# Patient Record
Sex: Female | Born: 1970
Health system: Southern US, Community
[De-identification: ages and names within clinical notes are randomized; demographics above are authoritative.]

## PROBLEM LIST (undated history)

## (undated) DIAGNOSIS — Z464 Encounter for fitting and adjustment of orthodontic device: Secondary | ICD-10-CM

## (undated) DIAGNOSIS — E785 Hyperlipidemia, unspecified: Secondary | ICD-10-CM

## (undated) DIAGNOSIS — IMO0001 Reserved for inherently not codable concepts without codable children: Secondary | ICD-10-CM

## (undated) DIAGNOSIS — E119 Type 2 diabetes mellitus without complications: Secondary | ICD-10-CM

## (undated) DIAGNOSIS — I7300 Raynaud's syndrome without gangrene: Secondary | ICD-10-CM

## (undated) DIAGNOSIS — G43909 Migraine, unspecified, not intractable, without status migrainosus: Secondary | ICD-10-CM

## (undated) DIAGNOSIS — I10 Essential (primary) hypertension: Secondary | ICD-10-CM

## (undated) DIAGNOSIS — K219 Gastro-esophageal reflux disease without esophagitis: Secondary | ICD-10-CM

## (undated) HISTORY — DX: Type 2 diabetes mellitus without complications: E11.9

## (undated) HISTORY — DX: Essential (primary) hypertension: I10

## (undated) HISTORY — PX: BREAST BIOPSY: SHX20

## (undated) HISTORY — PX: BREAST SURGERY: SHX581

## (undated) HISTORY — DX: Hyperlipidemia, unspecified: E78.5

## (undated) HISTORY — PX: ABDOMINAL HYSTERECTOMY: SHX81

## (undated) HISTORY — PX: APPENDECTOMY: SHX54

---

## 1995-08-25 HISTORY — PX: REDUCTION MAMMAPLASTY: SUR839

## 2000-10-04 ENCOUNTER — Other Ambulatory Visit: Admission: RE | Admit: 2000-10-04 | Discharge: 2000-10-04 | Payer: Self-pay | Admitting: Obstetrics and Gynecology

## 2001-02-25 ENCOUNTER — Ambulatory Visit (HOSPITAL_COMMUNITY): Admission: RE | Admit: 2001-02-25 | Discharge: 2001-02-25 | Payer: Self-pay | Admitting: Obstetrics and Gynecology

## 2001-02-25 ENCOUNTER — Encounter: Payer: Self-pay | Admitting: Obstetrics and Gynecology

## 2001-03-07 ENCOUNTER — Inpatient Hospital Stay (HOSPITAL_COMMUNITY): Admission: AD | Admit: 2001-03-07 | Discharge: 2001-03-07 | Payer: Self-pay | Admitting: Obstetrics and Gynecology

## 2001-03-07 ENCOUNTER — Encounter: Payer: Self-pay | Admitting: Obstetrics and Gynecology

## 2001-04-26 ENCOUNTER — Encounter: Admission: RE | Admit: 2001-04-26 | Discharge: 2001-07-25 | Payer: Self-pay | Admitting: Obstetrics and Gynecology

## 2001-06-18 ENCOUNTER — Inpatient Hospital Stay (HOSPITAL_COMMUNITY): Admission: AD | Admit: 2001-06-18 | Discharge: 2001-06-18 | Payer: Self-pay | Admitting: Obstetrics and Gynecology

## 2001-06-19 ENCOUNTER — Inpatient Hospital Stay (HOSPITAL_COMMUNITY): Admission: AD | Admit: 2001-06-19 | Discharge: 2001-06-21 | Payer: Self-pay | Admitting: Obstetrics and Gynecology

## 2001-07-20 ENCOUNTER — Other Ambulatory Visit: Admission: RE | Admit: 2001-07-20 | Discharge: 2001-07-20 | Payer: Self-pay | Admitting: Obstetrics and Gynecology

## 2004-10-03 ENCOUNTER — Other Ambulatory Visit: Admission: RE | Admit: 2004-10-03 | Discharge: 2004-10-03 | Payer: Self-pay | Admitting: Obstetrics and Gynecology

## 2005-08-28 ENCOUNTER — Inpatient Hospital Stay (HOSPITAL_COMMUNITY): Admission: AD | Admit: 2005-08-28 | Discharge: 2005-08-28 | Payer: Self-pay | Admitting: Obstetrics and Gynecology

## 2005-09-09 ENCOUNTER — Inpatient Hospital Stay (HOSPITAL_COMMUNITY): Admission: AD | Admit: 2005-09-09 | Discharge: 2005-09-12 | Payer: Self-pay | Admitting: Obstetrics and Gynecology

## 2005-10-27 ENCOUNTER — Other Ambulatory Visit: Admission: RE | Admit: 2005-10-27 | Discharge: 2005-10-27 | Payer: Self-pay | Admitting: Obstetrics and Gynecology

## 2007-04-09 ENCOUNTER — Observation Stay: Payer: Self-pay | Admitting: Surgery

## 2009-05-16 ENCOUNTER — Other Ambulatory Visit: Payer: Self-pay | Admitting: Family Medicine

## 2010-03-18 ENCOUNTER — Other Ambulatory Visit: Payer: Self-pay | Admitting: Family Medicine

## 2010-08-01 ENCOUNTER — Ambulatory Visit: Payer: Self-pay | Admitting: General Surgery

## 2011-02-03 ENCOUNTER — Other Ambulatory Visit: Payer: Self-pay | Admitting: Family Medicine

## 2011-08-16 ENCOUNTER — Emergency Department: Payer: Self-pay | Admitting: Emergency Medicine

## 2012-05-27 ENCOUNTER — Other Ambulatory Visit: Payer: Self-pay | Admitting: Family Medicine

## 2012-05-27 ENCOUNTER — Ambulatory Visit: Payer: Self-pay | Admitting: Family Medicine

## 2012-05-27 LAB — CBC WITH DIFFERENTIAL/PLATELET
Basophil #: 0.1 10*3/uL (ref 0.0–0.1)
Basophil %: 0.8 %
Eosinophil #: 0.1 10*3/uL (ref 0.0–0.7)
HCT: 38.6 % (ref 35.0–47.0)
HGB: 12.3 g/dL (ref 12.0–16.0)
Lymphocyte #: 2.5 10*3/uL (ref 1.0–3.6)
MCH: 24.7 pg — ABNORMAL LOW (ref 26.0–34.0)
MCHC: 32 g/dL (ref 32.0–36.0)
MCV: 77 fL — ABNORMAL LOW (ref 80–100)
Monocyte #: 0.4 x10 3/mm (ref 0.2–0.9)
Neutrophil #: 6.2 10*3/uL (ref 1.4–6.5)
Neutrophil %: 67 %
RDW: 16 % — ABNORMAL HIGH (ref 11.5–14.5)

## 2012-05-27 LAB — COMPREHENSIVE METABOLIC PANEL
Albumin: 4.1 g/dL (ref 3.4–5.0)
Alkaline Phosphatase: 133 U/L (ref 50–136)
Bilirubin,Total: 0.3 mg/dL (ref 0.2–1.0)
Calcium, Total: 8.9 mg/dL (ref 8.5–10.1)
Chloride: 92 mmol/L — ABNORMAL LOW (ref 98–107)
Co2: 25 mmol/L (ref 21–32)
EGFR (Non-African Amer.): >60
Glucose: 81 mg/dL (ref 65–99)
SGOT(AST): 16 U/L (ref 15–37)
SGPT (ALT): 20 U/L (ref 12–78)

## 2012-08-05 ENCOUNTER — Ambulatory Visit: Payer: Self-pay | Admitting: Gastroenterology

## 2013-06-14 ENCOUNTER — Other Ambulatory Visit: Payer: Self-pay | Admitting: Family Medicine

## 2013-06-14 LAB — COMPREHENSIVE METABOLIC PANEL
Albumin: 4.4 g/dL (ref 3.4–5.0)
Bilirubin,Total: 0.3 mg/dL (ref 0.2–1.0)
Calcium, Total: 9.4 mg/dL (ref 8.5–10.1)
Chloride: 105 mmol/L (ref 98–107)
Co2: 28 mmol/L (ref 21–32)
Creatinine: 0.83 mg/dL (ref 0.60–1.30)
EGFR (African American): >60
Osmolality: 277 (ref 275–301)
SGOT(AST): 17 U/L (ref 15–37)
SGPT (ALT): 19 U/L (ref 12–78)

## 2013-06-14 LAB — LIPID PANEL
Cholesterol: 148 mg/dL (ref 0–200)
Triglycerides: 75 mg/dL (ref 0–200)

## 2013-06-23 ENCOUNTER — Ambulatory Visit: Payer: Self-pay | Admitting: Family Medicine

## 2013-11-22 ENCOUNTER — Encounter: Payer: Self-pay | Admitting: *Deleted

## 2013-12-07 ENCOUNTER — Ambulatory Visit: Payer: Self-pay | Admitting: Nurse Practitioner

## 2014-05-22 ENCOUNTER — Other Ambulatory Visit: Payer: Self-pay | Admitting: Family Medicine

## 2014-05-22 LAB — COMPREHENSIVE METABOLIC PANEL
ALT: 25 U/L
Albumin: 3.8 g/dL (ref 3.4–5.0)
Alkaline Phosphatase: 119 U/L — ABNORMAL HIGH
Anion Gap: 6 — ABNORMAL LOW (ref 7–16)
BUN: 21 mg/dL — AB (ref 7–18)
Bilirubin,Total: 0.2 mg/dL (ref 0.2–1.0)
CALCIUM: 8.9 mg/dL (ref 8.5–10.1)
CO2: 27 mmol/L (ref 21–32)
Chloride: 107 mmol/L (ref 98–107)
Creatinine: 0.78 mg/dL (ref 0.60–1.30)
EGFR (African American): >60
GLUCOSE: 114 mg/dL — AB (ref 65–99)
OSMOLALITY: 283 (ref 275–301)
Potassium: 4 mmol/L (ref 3.5–5.1)
SGOT(AST): 12 U/L — ABNORMAL LOW (ref 15–37)
SODIUM: 140 mmol/L (ref 136–145)
TOTAL PROTEIN: 7.4 g/dL (ref 6.4–8.2)

## 2014-05-22 LAB — LIPID PANEL
CHOLESTEROL: 160 mg/dL (ref 0–200)
HDL Cholesterol: 34 mg/dL — ABNORMAL LOW (ref 40–60)
Ldl Cholesterol, Calc: 104 mg/dL — ABNORMAL HIGH (ref 0–100)
TRIGLYCERIDES: 111 mg/dL (ref 0–200)
VLDL Cholesterol, Calc: 22 mg/dL (ref 5–40)

## 2014-07-05 ENCOUNTER — Other Ambulatory Visit (INDEPENDENT_AMBULATORY_CARE_PROVIDER_SITE_OTHER): Payer: Self-pay

## 2014-07-05 DIAGNOSIS — E785 Hyperlipidemia, unspecified: Secondary | ICD-10-CM

## 2014-07-05 DIAGNOSIS — E0821 Diabetes mellitus due to underlying condition with diabetic nephropathy: Secondary | ICD-10-CM

## 2014-07-28 ENCOUNTER — Ambulatory Visit: Payer: Self-pay | Admitting: Dietician

## 2014-08-27 ENCOUNTER — Encounter (HOSPITAL_COMMUNITY)
Admission: RE | Disposition: A | Discharge status: Home / Self Care | Payer: Self-pay | Source: Ambulatory Visit | Attending: Surgery

## 2014-08-27 ENCOUNTER — Ambulatory Visit (HOSPITAL_COMMUNITY)
Admission: RE | Admit: 2014-08-27 | Discharge: 2014-08-27 | Disposition: A | Discharge status: Home / Self Care | Payer: 59 | Source: Ambulatory Visit | Attending: Surgery | Admitting: Surgery

## 2014-08-27 DIAGNOSIS — Z01818 Encounter for other preprocedural examination: Secondary | ICD-10-CM | POA: Insufficient documentation

## 2014-08-27 DIAGNOSIS — E669 Obesity, unspecified: Secondary | ICD-10-CM | POA: Diagnosis not present

## 2014-08-27 HISTORY — PX: BREATH TEK H PYLORI: SHX5422

## 2014-08-27 LAB — PROTIME-INR
INR: 0.9 (ref <1.50)
Prothrombin Time: 12.2 seconds (ref 11.6–15.2)

## 2014-08-27 SURGERY — BREATH TEST, FOR HELICOBACTER PYLORI

## 2014-08-28 ENCOUNTER — Encounter (HOSPITAL_COMMUNITY): Payer: Self-pay | Admitting: Surgery

## 2014-08-28 LAB — LIPID PANEL
Cholesterol: 143 mg/dL (ref 0–200)
HDL: 39 mg/dL — AB (ref >39)
LDL Cholesterol: 92 mg/dL (ref 0–99)
TRIGLYCERIDES: 61 mg/dL (ref <150)
Total CHOL/HDL Ratio: 3.7 Ratio
VLDL: 12 mg/dL (ref 0–40)

## 2014-08-28 LAB — COMPREHENSIVE METABOLIC PANEL
ALT: 15 U/L (ref 0–35)
AST: 13 U/L (ref 0–37)
Albumin: 4.4 g/dL (ref 3.5–5.2)
Alkaline Phosphatase: 112 U/L (ref 39–117)
BUN: 16 mg/dL (ref 6–23)
CALCIUM: 9.4 mg/dL (ref 8.4–10.5)
CO2: 24 meq/L (ref 19–32)
Chloride: 102 mEq/L (ref 96–112)
Creat: 0.65 mg/dL (ref 0.50–1.10)
Glucose, Bld: 108 mg/dL — ABNORMAL HIGH (ref 70–99)
POTASSIUM: 4.2 meq/L (ref 3.5–5.3)
Sodium: 139 mEq/L (ref 135–145)
Total Bilirubin: 0.3 mg/dL (ref 0.2–1.2)
Total Protein: 6.8 g/dL (ref 6.0–8.3)

## 2014-08-28 LAB — CBC WITH DIFFERENTIAL/PLATELET
BASOS PCT: 0 % (ref 0–1)
Basophils Absolute: 0 10*3/uL (ref 0.0–0.1)
EOS PCT: 2 % (ref 0–5)
Eosinophils Absolute: 0.2 10*3/uL (ref 0.0–0.7)
HEMATOCRIT: 39 % (ref 36.0–46.0)
HEMOGLOBIN: 11.8 g/dL — AB (ref 12.0–15.0)
LYMPHS PCT: 34 % (ref 12–46)
Lymphs Abs: 3.1 10*3/uL (ref 0.7–4.0)
MCH: 23.6 pg — ABNORMAL LOW (ref 26.0–34.0)
MCHC: 30.3 g/dL (ref 30.0–36.0)
MCV: 77.8 fL — AB (ref 78.0–100.0)
MONO ABS: 0.5 10*3/uL (ref 0.1–1.0)
MONOS PCT: 5 % (ref 3–12)
Neutro Abs: 5.3 10*3/uL (ref 1.7–7.7)
Neutrophils Relative %: 59 % (ref 43–77)
Platelets: 414 10*3/uL — ABNORMAL HIGH (ref 150–400)
RBC: 5.01 MIL/uL (ref 3.87–5.11)
RDW: 16.2 % — ABNORMAL HIGH (ref 11.5–15.5)
WBC: 9 10*3/uL (ref 4.0–10.5)

## 2014-08-28 LAB — FOLATE: Folate: 7.5 ng/mL

## 2014-08-28 LAB — URINALYSIS
Bilirubin Urine: NEGATIVE
Glucose, UA: NEGATIVE mg/dL
Hgb urine dipstick: NEGATIVE
Ketones, ur: NEGATIVE mg/dL
Leukocytes, UA: NEGATIVE
NITRITE: NEGATIVE
Protein, ur: NEGATIVE mg/dL
Specific Gravity, Urine: 1.01 (ref 1.005–1.030)
UROBILINOGEN UA: 0.2 mg/dL (ref 0.0–1.0)
pH: 5.5 (ref 5.0–8.0)

## 2014-08-28 LAB — H. PYLORI ANTIBODY, IGG: H Pylori IgG: <0.4 {ISR}

## 2014-08-28 LAB — HEMOGLOBIN A1C
Hgb A1c MFr Bld: 6.7 % — ABNORMAL HIGH (ref <5.7)
MEAN PLASMA GLUCOSE: 146 mg/dL — AB (ref <117)

## 2014-08-28 LAB — VITAMIN B12: Vitamin B-12: 311 pg/mL (ref 211–911)

## 2014-08-28 LAB — TSH: TSH: 2.22 u[IU]/mL (ref 0.350–4.500)

## 2014-08-28 LAB — T4: T4 TOTAL: 7.2 ug/dL (ref 4.5–12.0)

## 2014-08-28 LAB — FERRITIN: Ferritin: 67 ng/mL (ref 10–291)

## 2014-08-29 LAB — VITAMIN D 1,25 DIHYDROXY
VITAMIN D 1, 25 (OH) TOTAL: 41 pg/mL (ref 18–72)
Vitamin D2 1, 25 (OH)2: <8 pg/mL
Vitamin D3 1, 25 (OH)2: 41 pg/mL

## 2014-08-30 ENCOUNTER — Encounter: Payer: 59 | Attending: Surgery | Admitting: Dietician

## 2014-08-30 ENCOUNTER — Encounter: Payer: Self-pay | Admitting: Dietician

## 2014-08-30 ENCOUNTER — Ambulatory Visit (HOSPITAL_COMMUNITY): Payer: 59

## 2014-08-30 VITALS — Ht 63.0 in | Wt 221.0 lb

## 2014-08-30 DIAGNOSIS — Z6839 Body mass index (BMI) 39.0-39.9, adult: Secondary | ICD-10-CM | POA: Insufficient documentation

## 2014-08-30 DIAGNOSIS — E669 Obesity, unspecified: Secondary | ICD-10-CM | POA: Diagnosis present

## 2014-08-30 DIAGNOSIS — Z713 Dietary counseling and surveillance: Secondary | ICD-10-CM | POA: Insufficient documentation

## 2014-08-30 LAB — VITAMIN B1: VITAMIN B1 (THIAMINE): 6 nmol/L — AB (ref 8–30)

## 2014-08-30 NOTE — Progress Notes (Signed)
  Pre-Op Assessment Visit:  Pre-Operative Gastric sleeve Surgery  Medical Nutrition Therapy:  Appt start time: 1400   End time:  4825  Patient was seen on 08/30/14 for Pre-Operative Nutrition Assessment. Assessment and letter of approval faxed to [facility] Bariatric Surgery Program coordinator on 08/30/14.   Preferred Learning Style:   No preference indicated   Learning Readiness:   Ready  Handouts given during visit include:  Pre-Op Goals Bariatric Surgery Protein Shakes Bariatric fast food options  Samples provided and patient instructed on proper use: Bariatric Advantage Calcium chewy bites (orange - qty 3) Lot#: [id number] Exp: 12/2014    During the appointment today the following Pre-Op Goals were reviewed with the patient: Maintain or lose weight as instructed by your surgeon Make healthy food choices Begin to limit portion sizes Limited concentrated sugars and fried foods Keep fat/sugar in the single digits per serving on   food labels Practice CHEWING your food  (aim for 30 chews per bite or until applesauce consistency) Practice not drinking 15 minutes before, during, and 30 minutes after each meal/snack Avoid all carbonated beverages  Avoid/limit caffeinated beverages  Avoid all sugar-sweetened beverages Consume 3 meals per day; eat every 3-5 hours Make a list of non-food related activities Aim for 64-100 ounces of FLUID daily  Aim for at least 60-80 grams of PROTEIN daily Look for a liquid protein source that contain ?15 g protein and ?5 g carbohydrate  (ex: shakes, drinks, shots)  Patient-Centered Goals: Overall health, less medication, live longer  Demonstrated degree of understanding via:  Teach Back  Teaching Method Utilized:  Visual Auditory Hands on  Barriers to learning/adherence to lifestyle change: none  Patient to call the [facility] to enroll in Pre-Op and Post-Op Nutrition Education when surgery date is  scheduled.

## 2014-08-31 ENCOUNTER — Ambulatory Visit (HOSPITAL_COMMUNITY)
Admission: RE | Admit: 2014-08-31 | Discharge: 2014-08-31 | Disposition: A | Discharge status: Home / Self Care | Payer: 59 | Source: Ambulatory Visit | Attending: Surgery | Admitting: Surgery

## 2014-08-31 ENCOUNTER — Other Ambulatory Visit: Payer: Self-pay

## 2014-08-31 ENCOUNTER — Ambulatory Visit (HOSPITAL_COMMUNITY): Admission: RE | Admit: 2014-08-31 | Payer: 59 | Source: Ambulatory Visit

## 2014-08-31 ENCOUNTER — Other Ambulatory Visit (HOSPITAL_COMMUNITY): Payer: Self-pay | Admitting: Surgery

## 2014-08-31 DIAGNOSIS — R932 Abnormal findings on diagnostic imaging of liver and biliary tract: Secondary | ICD-10-CM | POA: Insufficient documentation

## 2014-08-31 DIAGNOSIS — E119 Type 2 diabetes mellitus without complications: Secondary | ICD-10-CM | POA: Diagnosis not present

## 2014-08-31 DIAGNOSIS — Z01818 Encounter for other preprocedural examination: Secondary | ICD-10-CM | POA: Diagnosis not present

## 2014-08-31 DIAGNOSIS — E785 Hyperlipidemia, unspecified: Secondary | ICD-10-CM | POA: Insufficient documentation

## 2014-08-31 DIAGNOSIS — Z1231 Encounter for screening mammogram for malignant neoplasm of breast: Secondary | ICD-10-CM | POA: Diagnosis not present

## 2014-10-15 ENCOUNTER — Encounter: Payer: 59 | Attending: Surgery

## 2014-10-15 DIAGNOSIS — E669 Obesity, unspecified: Secondary | ICD-10-CM | POA: Insufficient documentation

## 2014-10-15 DIAGNOSIS — Z713 Dietary counseling and surveillance: Secondary | ICD-10-CM | POA: Insufficient documentation

## 2014-10-15 DIAGNOSIS — Z6839 Body mass index (BMI) 39.0-39.9, adult: Secondary | ICD-10-CM | POA: Diagnosis not present

## 2014-10-17 NOTE — Progress Notes (Signed)
  Pre-Operative Nutrition Class:  Appt start time: 4699   End time:  1830.  Patient was seen on 10/15/2014 for Pre-Operative Bariatric Surgery Education at the [facility].   Surgery date: 11/05/2014 Surgery type: Gastric sleeve Start weight at [facility]: 221 lbs on 08/30/14 Weight today: 222 lbs  TANITA  BODY COMP RESULTS  10/15/14   BMI (kg/m^2) 39.3   Fat Mass (lbs) 109   Fat Free Mass (lbs) 113   Total Body Water (lbs) 82.5   Samples given per MNT protocol. Patient educated on appropriate usage: Premier protein shake (strawberry - qty 1) Lot #: [id number] Exp: 04/2015  Unjury protein powder (chicken soup - qty 1) Lot #: [id number] Exp: 11/2015  PB2 (qty 1) Lot #: [id number] Exp: 03/2015  Bariatric Advantage Calcium Citrate chew (orange - qty 1) Lot #: [id number] Exp: 12/2014  The following the learning objectives were met by the patient during this course:  Identify Pre-Op Dietary Goals and will begin 2 weeks pre-operatively  Identify appropriate sources of fluids and proteins   State protein recommendations and appropriate sources pre and post-operatively  Identify Post-Operative Dietary Goals and will follow for 2 weeks post-operatively  Identify appropriate multivitamin and calcium sources  Describe the need for physical activity post-operatively and will follow MD recommendations  State when to call healthcare provider regarding medication questions or post-operative complications  Handouts given during class include:  Pre-Op Bariatric Surgery Diet Handout  Protein Shake Handout  Post-Op Bariatric Surgery Nutrition Handout  [facility] Program Information Flyer  Support Group Information Flyer  [facility] Bariatric Supplements Price List  Follow-Up Plan: Patient will follow-up at [facility] 2 weeks post operatively for diet advancement per MD.

## 2014-10-18 ENCOUNTER — Ambulatory Visit (INDEPENDENT_AMBULATORY_CARE_PROVIDER_SITE_OTHER): Payer: Self-pay | Admitting: Surgery

## 2014-10-18 NOTE — H&P (Signed)
Shannon Soto. Shannon Soto 07/05/2014 10:15 AM Location: [facility] Patient #: [id number] DOB: October 06, 1970 Single / Language: Shannon Soto / Race: Black or African American Female  History of Present Illness Shannon Soto Done MD; 07/05/2014 11:00 AM) Patient words: bariatric consult  Shannon Soto is a 44 year old AA female who works in the Associate Professor at [employer]. She's been to our seminar and comes in interested in a sleeve gastrectomy. She is followed by Dr.Krichna Sowles in [place] she lives in [place]. Her BMI is 38.79 and she has type 2 diabetes, hypercholesterolemia, and hypertension. She has demonstrated weight loss and improvement in her comorbidities but then has weight recidivism. She hasn't researched this and wants to have a sleeve gastrectomy. I did talk to her about the benefits of the gastric bypass with type 2 diabetes and I think if she were to reconsider we could always move in that direction. For now we will try to get her approved for a sleeve gastrectomy.  She is tried multiple weight loss plans again with some degrees of success but always regaining weight. She seems like a very motivated lady, who is well-informed and most proceed with bariatric surgery at this time and I think she would be a very good candidate.  The patient is a 44 year old female    Other Problems Shannon Soto, [place]; 07/05/2014 10:17 AM) Diabetes Mellitus Gastroesophageal Reflux Disease Hemorrhoids High blood pressure Hypercholesterolemia  Past Surgical History Shannon Soto, [place]; 07/05/2014 10:17 AM) Appendectomy Hysterectomy (not due to cancer) - Partial  Diagnostic Studies History Shannon Soto, CMA; 07/05/2014 10:17 AM) Colonoscopy 1-5 years ago Mammogram within last year Pap Smear 1-5 years ago  Allergies Shannon Soto, CMA; 07/05/2014 10:19 AM) No Known Drug Allergies11/07/2014  Medication History ([name], CMA; 07/05/2014 10:19 AM) AmLODIPine  Besylate (10MG  Tablet, Oral) Active. FLUoxetine HCl (10MG  Capsule, Oral) Active. Gabapentin (600MG  Tablet, Oral) Active. Ibuprofen (800MG  Tablet, Oral) Active. Losartan Potassium (25MG  Tablet, Oral) Active. MetFORMIN HCl ER (500MG  Tablet ER 24HR, Oral) Active. Pravastatin Sodium (40MG  Tablet, Oral) Active. Ranitidine HCl (300MG  Tablet, Oral) Active. Zolpidem Tartrate (10MG  Tablet, Oral) Active.  Social History Shannon Soto, [place]; 07/05/2014 10:17 AM) Alcohol use Occasional alcohol use. Caffeine use Tea. No drug use Tobacco use Never smoker.  Family History Shannon Soto, [place]; 07/05/2014 10:17 AM) Diabetes Mellitus Father. Hypertension Father, Mother, Son.  Pregnancy / Birth History Shannon Soto, [place]; 07/05/2014 10:17 AM) Age at menarche 65 years. Gravida 4 Maternal age 57-25 Para 3  Review of Systems ([name]; 07/05/2014 10:17 AM) General Present- Weight Gain. Not Present- Appetite Loss, Chills, Fatigue, Fever, Night Sweats and Weight Loss. Skin Not Present- Change in Wart/Mole, Dryness, Hives, Jaundice, New Lesions, Non-Healing Wounds, Rash and Ulcer. HEENT Not Present- Earache, Hearing Loss, Hoarseness, Nose Bleed, Oral Ulcers, Ringing in the Ears, Seasonal Allergies, Sinus Pain, Sore Throat, Visual Disturbances, Wears glasses/contact lenses and Yellow Eyes. Respiratory Not Present- Bloody sputum, Chronic Cough, Difficulty Breathing, Snoring and Wheezing. Breast Not Present- Breast Mass, Breast Pain, Nipple Discharge and Skin Changes. Cardiovascular Present- Leg Cramps. Not Present- Chest Pain, Difficulty Breathing Lying Down, Palpitations, Rapid Heart Rate, Shortness of Breath and Swelling of Extremities. Gastrointestinal Present- Bloating, Constipation and Hemorrhoids. Not Present- Abdominal Pain, Bloody Stool, Change in Bowel Habits, Chronic diarrhea, Difficulty Swallowing, Excessive gas, Gets full quickly at meals, Indigestion, Nausea, Rectal Pain and  Vomiting. Female Genitourinary Not Present- Frequency, Nocturia, Painful Urination, Pelvic Pain and Urgency. Neurological Present- Headaches. Not Present- Decreased Memory, Fainting, Numbness,  Seizures, Tingling, Tremor, Trouble walking and Weakness. Psychiatric Not Present- Anxiety, Bipolar, Change in Sleep Pattern, Depression, Fearful and Frequent crying. Endocrine Present- Hot flashes. Not Present- Cold Intolerance, Excessive Hunger, Hair Changes, Heat Intolerance and New Diabetes. Hematology Not Present- Easy Bruising, Excessive bleeding, Gland problems, HIV and Persistent Infections.   Vitals ([name] CMA; 07/05/2014 10:20 AM) 07/05/2014 10:19 AM Weight: 219 lb Height: 63in Body Surface Area: 2.1 m Body Mass Index: 38.79 kg/m Temp.: 66F(Temporal)  Pulse: 81 (Regular)  BP: 132/76 (Sitting, Left Arm, Standard)    Physical Exam (Nashali Ditmer B. Soto Done MD; 07/05/2014 11:02 AM) The physical exam findings are as follows: Note:Well-developed, well-nourished, African-American female in no acute distress HEENT exam unremarkable. Neck supple Chest clear to auscultation Heart sinus rhythm without murmurs or gallops. Abdomen obese nontender. Prior partial hysterectomy. Extremity exam for range of motion. No history of DVT. Neuro alert and oriented 3. Motor and sensory function grossly intact.    Assessment & Plan [place] Soto Done MD; 07/05/2014 11:03 AM) Virgina Evener OBESITY (278.01  E66.01) Impression: begin workup for sleeve gastrectomy Current Plans  CBC, PLATELETS & AUT DIFF (01093) METABOLIC PANEL, COMPREHENSIVE (80053) TSH (THYROID STIMULATING HORMONE) (23557) TT4 (THYROXINE TOTAL) (32202) HEMOGLOBIN GLYCLATED (HGB R4Y) (70623) HELICOBACTER PYLORI ANTIBODY (76283) H. PYLORI ANALYSIS UREASE ACTIVITY (83013) LIPID PANEL (15176) PT (PROTHROMBIN TIME) (16073) URINALYSIS (81005) IRON (83540) IRON BINDING CAPACITY (TIBC) (83550) FERRITIN (71062) VITAMIN B-12  (CYANOCOBALAMIN) (69485) VITAMIN D, 1, 46-EVOJJKKXF (81829) FOLIC ACID SERUM (93716) Referred to Psychology, for evaluation and follow up (Psychologist). Referred to Nutritional Counseling, for evaluation and follow up (Dietician/Health Nutritionist). COMPLETE ABDOMINAL ULTRASOUND (96789) CHEST XRAY, PA & LATERAL (38101) UGI W/ KUB (75102) MAMMOGRAM BREAST BILATERAL DIAGNOSTIC (58527) EKG (93000) I discussed sleeve gastrectomy with the patient to include the risks and benefits to include, but not limited to: infection, bleeding, damage to surrounding structures, chronic wound healing, nerve pain, leaks and bleeding. . The patient voiced understanding and wishes to proceed at this time.  UGI was normal.    Plan sleeve gastrectomy  Matt B. Soto Done, MD, [facility] [phone] beeper [phone]  10/18/2014 12:44 PM

## 2014-10-24 NOTE — Patient Instructions (Addendum)
Shannon Soto  10/24/2014   Your procedure is scheduled on: 11/05/2014    Report to [facility] Main  Entrance and follow signs to               [place] at     [place] AM.  Call this number if you have problems the morning of surgery [phone]   Remember:  Do not eat food or drink liquids :After Midnight.     Take these medicines the morning of surgery with A SIP OF WATER: none                                You may not have any metal on your body including hair pins and              piercings  Do not wear jewelry, make-up, lotions, powders or perfumes., deodorant.               Do not wear nail polish.  Do not shave  48 hours prior to surgery.           Do not bring valuables to the hospital. [place].  Contacts, dentures or bridgework may not be worn into surgery.  Leave suitcase in the car. After surgery it may be brought to your room.         Special Instructions: coughing and deep breathing exercises, leg exercises               Please read over the following fact sheets you were given: _____________________________________________________________________             [facility] - Preparing for Surgery Before surgery, you can play an important role.  Because skin is not sterile, your skin needs to be as free of germs as possible.  You can reduce the number of germs on your skin by washing with CHG (chlorahexidine gluconate) soap before surgery.  CHG is an antiseptic cleaner which kills germs and bonds with the skin to continue killing germs even after washing. Please DO NOT use if you have an allergy to CHG or antibacterial soaps.  If your skin becomes reddened/irritated stop using the CHG and inform your nurse when you arrive at Short Stay. Do not shave (including legs and underarms) for at least 48 hours prior to the first CHG shower.  You may shave your face/neck. Please follow these  instructions carefully:  1.  Shower with CHG Soap the night before surgery and the  morning of Surgery.  2.  If you choose to wash your hair, wash your hair first as usual with your  normal  shampoo.  3.  After you shampoo, rinse your hair and body thoroughly to remove the  shampoo.                           4.  Use CHG as you would any other liquid soap.  You can apply chg directly  to the skin and wash                       Gently with a scrungie or clean washcloth.  5.  Apply the CHG Soap  to your body ONLY FROM THE NECK DOWN.   Do not use on face/ open                           Wound or open sores. Avoid contact with eyes, ears mouth and genitals (private parts).                       Wash face,  Genitals (private parts) with your normal soap.             6.  Wash thoroughly, paying special attention to the area where your surgery  will be performed.  7.  Thoroughly rinse your body with warm water from the neck down.  8.  DO NOT shower/wash with your normal soap after using and rinsing off  the CHG Soap.                9.  Pat yourself dry with a clean towel.            10.  Wear clean pajamas.            11.  Place clean sheets on your bed the night of your first shower and do not  sleep with pets. Day of Surgery : Do not apply any lotions/deodorants the morning of surgery.  Please wear clean clothes to the hospital/surgery center.  FAILURE TO FOLLOW THESE INSTRUCTIONS MAY RESULT IN THE CANCELLATION OF YOUR SURGERY PATIENT SIGNATURE_________________________________  NURSE SIGNATURE__________________________________  ________________________________________________________________________

## 2014-10-26 ENCOUNTER — Encounter (HOSPITAL_COMMUNITY): Payer: Self-pay

## 2014-10-26 ENCOUNTER — Encounter (HOSPITAL_COMMUNITY)
Admission: RE | Admit: 2014-10-26 | Discharge: 2014-10-26 | Disposition: A | Discharge status: Home / Self Care | Payer: 59 | Source: Ambulatory Visit | Attending: Surgery | Admitting: Surgery

## 2014-10-26 DIAGNOSIS — Z01812 Encounter for preprocedural laboratory examination: Secondary | ICD-10-CM | POA: Diagnosis not present

## 2014-10-26 LAB — CBC WITH DIFFERENTIAL/PLATELET
Basophils Absolute: 0 10*3/uL (ref 0.0–0.1)
Basophils Relative: 0 % (ref 0–1)
EOS ABS: 0.1 10*3/uL (ref 0.0–0.7)
Eosinophils Relative: 1 % (ref 0–5)
HCT: 38 % (ref 36.0–46.0)
Hemoglobin: 11.7 g/dL — ABNORMAL LOW (ref 12.0–15.0)
Lymphocytes Relative: 24 % (ref 12–46)
Lymphs Abs: 2.4 10*3/uL (ref 0.7–4.0)
MCH: 24.1 pg — AB (ref 26.0–34.0)
MCHC: 30.8 g/dL (ref 30.0–36.0)
MCV: 78.4 fL (ref 78.0–100.0)
Monocytes Absolute: 0.8 10*3/uL (ref 0.1–1.0)
Monocytes Relative: 8 % (ref 3–12)
Neutro Abs: 6.7 10*3/uL (ref 1.7–7.7)
Neutrophils Relative %: 67 % (ref 43–77)
PLATELETS: 412 10*3/uL — AB (ref 150–400)
RBC: 4.85 MIL/uL (ref 3.87–5.11)
RDW: 16.1 % — AB (ref 11.5–15.5)
WBC: 10 10*3/uL (ref 4.0–10.5)

## 2014-10-26 LAB — COMPREHENSIVE METABOLIC PANEL
ALBUMIN: 4.6 g/dL (ref 3.5–5.2)
ALT: 22 U/L (ref 0–35)
AST: 23 U/L (ref 0–37)
Alkaline Phosphatase: 104 U/L (ref 39–117)
Anion gap: 8 (ref 5–15)
BUN: 21 mg/dL (ref 6–23)
CO2: 27 mmol/L (ref 19–32)
CREATININE: 0.67 mg/dL (ref 0.50–1.10)
Calcium: 9.5 mg/dL (ref 8.4–10.5)
Chloride: 106 mmol/L (ref 96–112)
GFR calc Af Amer: >90 mL/min (ref >90)
GFR calc non Af Amer: >90 mL/min (ref >90)
Glucose, Bld: 104 mg/dL — ABNORMAL HIGH (ref 70–99)
POTASSIUM: 3.9 mmol/L (ref 3.5–5.1)
SODIUM: 141 mmol/L (ref 135–145)
TOTAL PROTEIN: 7.8 g/dL (ref 6.0–8.3)
Total Bilirubin: 0.7 mg/dL (ref 0.3–1.2)

## 2014-10-26 NOTE — Progress Notes (Signed)
EKG 08/31/14- EPIC

## 2014-11-05 ENCOUNTER — Inpatient Hospital Stay (HOSPITAL_COMMUNITY): Payer: 59 | Admitting: Certified Registered"

## 2014-11-05 ENCOUNTER — Inpatient Hospital Stay (HOSPITAL_COMMUNITY)
Admission: RE | Admit: 2014-11-05 | Discharge: 2014-11-08 | DRG: 621 | Disposition: A | Discharge status: Home / Self Care | Payer: 59 | Source: Ambulatory Visit | Attending: Surgery | Admitting: Surgery

## 2014-11-05 ENCOUNTER — Encounter (HOSPITAL_COMMUNITY): Payer: Self-pay

## 2014-11-05 ENCOUNTER — Encounter (HOSPITAL_COMMUNITY)
Admission: RE | Disposition: A | Discharge status: Home / Self Care | Payer: Self-pay | Source: Ambulatory Visit | Attending: Surgery

## 2014-11-05 DIAGNOSIS — R112 Nausea with vomiting, unspecified: Secondary | ICD-10-CM | POA: Diagnosis not present

## 2014-11-05 DIAGNOSIS — K219 Gastro-esophageal reflux disease without esophagitis: Secondary | ICD-10-CM | POA: Diagnosis present

## 2014-11-05 DIAGNOSIS — Z79899 Other long term (current) drug therapy: Secondary | ICD-10-CM | POA: Diagnosis not present

## 2014-11-05 DIAGNOSIS — K449 Diaphragmatic hernia without obstruction or gangrene: Secondary | ICD-10-CM | POA: Diagnosis present

## 2014-11-05 DIAGNOSIS — E780 Pure hypercholesterolemia: Secondary | ICD-10-CM | POA: Diagnosis present

## 2014-11-05 DIAGNOSIS — Z6838 Body mass index (BMI) 38.0-38.9, adult: Secondary | ICD-10-CM | POA: Diagnosis not present

## 2014-11-05 DIAGNOSIS — Z8249 Family history of ischemic heart disease and other diseases of the circulatory system: Secondary | ICD-10-CM

## 2014-11-05 DIAGNOSIS — Z9884 Bariatric surgery status: Secondary | ICD-10-CM

## 2014-11-05 DIAGNOSIS — E119 Type 2 diabetes mellitus without complications: Secondary | ICD-10-CM | POA: Diagnosis present

## 2014-11-05 DIAGNOSIS — Z01812 Encounter for preprocedural laboratory examination: Secondary | ICD-10-CM | POA: Diagnosis not present

## 2014-11-05 DIAGNOSIS — I10 Essential (primary) hypertension: Secondary | ICD-10-CM | POA: Diagnosis present

## 2014-11-05 HISTORY — PX: LAPAROSCOPIC GASTRIC SLEEVE RESECTION: SHX5895

## 2014-11-05 HISTORY — PX: HIATAL HERNIA REPAIR: SHX195

## 2014-11-05 HISTORY — PX: UPPER GI ENDOSCOPY: SHX6162

## 2014-11-05 LAB — GLUCOSE, CAPILLARY
GLUCOSE-CAPILLARY: 100 mg/dL — AB (ref 70–99)
GLUCOSE-CAPILLARY: 186 mg/dL — AB (ref 70–99)

## 2014-11-05 LAB — CREATININE, SERUM
Creatinine, Ser: 0.75 mg/dL (ref 0.50–1.10)
GFR calc Af Amer: >90 mL/min (ref >90)

## 2014-11-05 LAB — CBC
HCT: 37.6 % (ref 36.0–46.0)
Hemoglobin: 11.7 g/dL — ABNORMAL LOW (ref 12.0–15.0)
MCH: 24.6 pg — AB (ref 26.0–34.0)
MCHC: 31.1 g/dL (ref 30.0–36.0)
MCV: 79 fL (ref 78.0–100.0)
Platelets: 284 10*3/uL (ref 150–400)
RBC: 4.76 MIL/uL (ref 3.87–5.11)
RDW: 16 % — ABNORMAL HIGH (ref 11.5–15.5)
WBC: 16.5 10*3/uL — AB (ref 4.0–10.5)

## 2014-11-05 SURGERY — GASTRECTOMY, SLEEVE, LAPAROSCOPIC
Anesthesia: Monitor Anesthesia Care

## 2014-11-05 MED ORDER — GLYCOPYRROLATE 0.2 MG/ML IJ SOLN
INTRAMUSCULAR | Status: DC | PRN
  Administered 2014-11-05: 0.6 mg via INTRAVENOUS

## 2014-11-05 MED ORDER — MIDAZOLAM HCL 2 MG/2ML IJ SOLN
INTRAMUSCULAR | Status: AC
  Filled 2014-11-05: qty 2

## 2014-11-05 MED ORDER — EPHEDRINE SULFATE 50 MG/ML IJ SOLN
INTRAMUSCULAR | Status: AC
  Filled 2014-11-05: qty 1

## 2014-11-05 MED ORDER — HYDROMORPHONE HCL 1 MG/ML IJ SOLN
0.2500 mg | INTRAMUSCULAR | Status: DC | PRN
  Administered 2014-11-05: 0.5 mg via INTRAVENOUS
  Administered 2014-11-05 (×2): 0.25 mg via INTRAVENOUS

## 2014-11-05 MED ORDER — DEXTROSE 5 % IV SOLN
INTRAVENOUS | Status: AC
  Filled 2014-11-05: qty 2

## 2014-11-05 MED ORDER — ONDANSETRON HCL 4 MG/2ML IJ SOLN
INTRAMUSCULAR | Status: AC
  Filled 2014-11-05: qty 2

## 2014-11-05 MED ORDER — LACTATED RINGERS IV SOLN
INTRAVENOUS | Status: DC
  Administered 2014-11-05: 10:00:00 via INTRAVENOUS

## 2014-11-05 MED ORDER — PROMETHAZINE HCL 25 MG/ML IJ SOLN
6.2500 mg | INTRAMUSCULAR | Status: AC | PRN
  Administered 2014-11-05 (×2): 6.25 mg via INTRAVENOUS

## 2014-11-05 MED ORDER — GLYCOPYRROLATE 0.2 MG/ML IJ SOLN
INTRAMUSCULAR | Status: AC
  Filled 2014-11-05: qty 3

## 2014-11-05 MED ORDER — EPHEDRINE SULFATE 50 MG/ML IJ SOLN
INTRAMUSCULAR | Status: DC | PRN
  Administered 2014-11-05 (×2): 10 mg via INTRAVENOUS

## 2014-11-05 MED ORDER — DEXAMETHASONE SODIUM PHOSPHATE 10 MG/ML IJ SOLN
INTRAMUSCULAR | Status: DC | PRN
  Administered 2014-11-05: 10 mg via INTRAVENOUS

## 2014-11-05 MED ORDER — BUPIVACAINE LIPOSOME 1.3 % IJ SUSP
20.0000 mL | Freq: Once | INTRAMUSCULAR | Status: DC
  Filled 2014-11-05: qty 20

## 2014-11-05 MED ORDER — LIDOCAINE HCL (CARDIAC) 20 MG/ML IV SOLN
INTRAVENOUS | Status: AC
  Filled 2014-11-05: qty 5

## 2014-11-05 MED ORDER — FENTANYL CITRATE 0.05 MG/ML IJ SOLN
INTRAMUSCULAR | Status: AC
  Filled 2014-11-05: qty 5

## 2014-11-05 MED ORDER — OXYCODONE HCL 5 MG/5ML PO SOLN
5.0000 mg | Freq: Once | ORAL | Status: DC | PRN
  Filled 2014-11-05: qty 5

## 2014-11-05 MED ORDER — NEOSTIGMINE METHYLSULFATE 10 MG/10ML IV SOLN
INTRAVENOUS | Status: DC | PRN
  Administered 2014-11-05: 3 mg via INTRAVENOUS

## 2014-11-05 MED ORDER — HYDROMORPHONE HCL 1 MG/ML IJ SOLN
INTRAMUSCULAR | Status: AC
  Filled 2014-11-05: qty 1

## 2014-11-05 MED ORDER — PROPOFOL 10 MG/ML IV BOLUS
INTRAVENOUS | Status: DC | PRN
  Administered 2014-11-05: 150 mg via INTRAVENOUS

## 2014-11-05 MED ORDER — UNJURY CHOCOLATE CLASSIC POWDER
2.0000 [oz_av] | Freq: Four times a day (QID) | ORAL | Status: DC
  Administered 2014-11-08: 2 [oz_av] via ORAL

## 2014-11-05 MED ORDER — UNJURY CHICKEN SOUP POWDER: 2.0000 [oz_av] | Freq: Four times a day (QID) | ORAL | Status: DC

## 2014-11-05 MED ORDER — LACTATED RINGERS IV SOLN
INTRAVENOUS | Status: DC | PRN
  Administered 2014-11-05: 07:00:00 via INTRAVENOUS

## 2014-11-05 MED ORDER — SUCCINYLCHOLINE CHLORIDE 20 MG/ML IJ SOLN
INTRAMUSCULAR | Status: DC | PRN
  Administered 2014-11-05: 100 mg via INTRAVENOUS

## 2014-11-05 MED ORDER — HEPARIN SODIUM (PORCINE) 5000 UNIT/ML IJ SOLN
5000.0000 [IU] | Freq: Three times a day (TID) | INTRAMUSCULAR | Status: DC
  Administered 2014-11-05 – 2014-11-08 (×7): 5000 [IU] via SUBCUTANEOUS
  Filled 2014-11-05 (×12): qty 1

## 2014-11-05 MED ORDER — MORPHINE SULFATE 2 MG/ML IJ SOLN
2.0000 mg | INTRAMUSCULAR | Status: DC | PRN
  Administered 2014-11-05: 4 mg via INTRAVENOUS
  Administered 2014-11-05: 2 mg via INTRAVENOUS
  Administered 2014-11-05 – 2014-11-06 (×6): 4 mg via INTRAVENOUS
  Filled 2014-11-05 (×7): qty 2

## 2014-11-05 MED ORDER — SODIUM CHLORIDE 0.9 % IJ SOLN
INTRAMUSCULAR | Status: AC
  Filled 2014-11-05: qty 20

## 2014-11-05 MED ORDER — UNJURY VANILLA POWDER: 2.0000 [oz_av] | Freq: Four times a day (QID) | ORAL | Status: DC

## 2014-11-05 MED ORDER — DEXAMETHASONE SODIUM PHOSPHATE 10 MG/ML IJ SOLN
INTRAMUSCULAR | Status: AC
  Filled 2014-11-05: qty 1

## 2014-11-05 MED ORDER — LIDOCAINE HCL (PF) 2 % IJ SOLN
INTRAMUSCULAR | Status: DC | PRN
  Administered 2014-11-05: 40 mg via INTRADERMAL

## 2014-11-05 MED ORDER — OXYCODONE HCL 5 MG PO TABS: 5.0000 mg | ORAL_TABLET | Freq: Once | ORAL | Status: DC | PRN

## 2014-11-05 MED ORDER — ACETAMINOPHEN 160 MG/5ML PO SOLN
650.0000 mg | ORAL | Status: DC | PRN
  Administered 2014-11-07: 650 mg via ORAL
  Filled 2014-11-05: qty 20.3

## 2014-11-05 MED ORDER — ROCURONIUM BROMIDE 100 MG/10ML IV SOLN
INTRAVENOUS | Status: AC
  Filled 2014-11-05: qty 1

## 2014-11-05 MED ORDER — FENTANYL CITRATE 0.05 MG/ML IJ SOLN
INTRAMUSCULAR | Status: DC | PRN
  Administered 2014-11-05: 100 ug via INTRAVENOUS
  Administered 2014-11-05 (×2): 50 ug via INTRAVENOUS

## 2014-11-05 MED ORDER — ROCURONIUM BROMIDE 100 MG/10ML IV SOLN
INTRAVENOUS | Status: DC | PRN
  Administered 2014-11-05: 10 mg via INTRAVENOUS
  Administered 2014-11-05: 40 mg via INTRAVENOUS

## 2014-11-05 MED ORDER — MIDAZOLAM HCL 5 MG/5ML IJ SOLN
INTRAMUSCULAR | Status: DC | PRN
  Administered 2014-11-05: 2 mg via INTRAVENOUS

## 2014-11-05 MED ORDER — ONDANSETRON HCL 4 MG/2ML IJ SOLN
4.0000 mg | INTRAMUSCULAR | Status: DC | PRN
  Administered 2014-11-05 – 2014-11-06 (×5): 4 mg via INTRAVENOUS
  Filled 2014-11-05 (×6): qty 2

## 2014-11-05 MED ORDER — CHLORHEXIDINE GLUCONATE 4 % EX LIQD: 60.0000 mL | Freq: Once | CUTANEOUS | Status: DC

## 2014-11-05 MED ORDER — PANTOPRAZOLE SODIUM 40 MG IV SOLR
40.0000 mg | Freq: Every day | INTRAVENOUS | Status: DC
  Administered 2014-11-05 – 2014-11-07 (×3): 40 mg via INTRAVENOUS
  Filled 2014-11-05 (×4): qty 40

## 2014-11-05 MED ORDER — PROMETHAZINE HCL 25 MG/ML IJ SOLN
INTRAMUSCULAR | Status: AC
  Filled 2014-11-05: qty 1

## 2014-11-05 MED ORDER — ACETAMINOPHEN 160 MG/5ML PO SOLN
325.0000 mg | ORAL | Status: DC | PRN
Start: 2014-11-06 — End: 2014-11-08

## 2014-11-05 MED ORDER — HEPARIN SODIUM (PORCINE) 5000 UNIT/ML IJ SOLN
5000.0000 [IU] | INTRAMUSCULAR | Status: AC
  Administered 2014-11-05: 5000 [IU] via SUBCUTANEOUS
  Filled 2014-11-05: qty 1

## 2014-11-05 MED ORDER — MORPHINE SULFATE 2 MG/ML IJ SOLN
INTRAMUSCULAR | Status: AC
  Filled 2014-11-05: qty 1

## 2014-11-05 MED ORDER — CHLORHEXIDINE GLUCONATE 4 % EX LIQD
60.0000 mL | Freq: Once | CUTANEOUS | Status: DC
Start: 2014-11-06 — End: 2014-11-05

## 2014-11-05 MED ORDER — PROMETHAZINE HCL 25 MG/ML IJ SOLN
25.0000 mg | Freq: Four times a day (QID) | INTRAMUSCULAR | Status: DC | PRN
  Administered 2014-11-05 – 2014-11-06 (×2): 25 mg via INTRAVENOUS
  Filled 2014-11-05 (×2): qty 1

## 2014-11-05 MED ORDER — KCL IN DEXTROSE-NACL 20-5-0.45 MEQ/L-%-% IV SOLN
INTRAVENOUS | Status: DC
  Administered 2014-11-05 – 2014-11-07 (×7): via INTRAVENOUS
  Filled 2014-11-05 (×13): qty 1000

## 2014-11-05 MED ORDER — LACTATED RINGERS IR SOLN
Status: DC | PRN
  Administered 2014-11-05: 1000 mL

## 2014-11-05 MED ORDER — ONDANSETRON HCL 4 MG/2ML IJ SOLN
INTRAMUSCULAR | Status: DC | PRN
  Administered 2014-11-05: 4 mg via INTRAVENOUS

## 2014-11-05 MED ORDER — DEXTROSE 5 % IV SOLN
2.0000 g | INTRAVENOUS | Status: AC
  Administered 2014-11-05: 2 g via INTRAVENOUS

## 2014-11-05 MED ORDER — TISSEEL VH 10 ML EX KIT
PACK | CUTANEOUS | Status: AC
  Filled 2014-11-05: qty 2

## 2014-11-05 MED ORDER — 0.9 % SODIUM CHLORIDE (POUR BTL) OPTIME
TOPICAL | Status: DC | PRN
Start: 2014-11-05 — End: 2014-11-05
  Administered 2014-11-05: 1000 mL

## 2014-11-05 MED ORDER — OXYCODONE HCL 5 MG/5ML PO SOLN
5.0000 mg | ORAL | Status: DC | PRN
  Administered 2014-11-06 – 2014-11-07 (×5): 10 mg via ORAL
  Filled 2014-11-05 (×5): qty 10

## 2014-11-05 MED ORDER — SODIUM CHLORIDE 0.9 % IJ SOLN
INTRAMUSCULAR | Status: AC
  Filled 2014-11-05: qty 10

## 2014-11-05 MED ORDER — BUPIVACAINE LIPOSOME 1.3 % IJ SUSP
INTRAMUSCULAR | Status: DC | PRN
  Administered 2014-11-05: 20 mL

## 2014-11-05 MED ORDER — PROPOFOL 10 MG/ML IV BOLUS
INTRAVENOUS | Status: AC
  Filled 2014-11-05: qty 20

## 2014-11-05 SURGICAL SUPPLY — 67 items
APL SRG 32X5 SNPLK LF DISP (MISCELLANEOUS)
APPLICATOR COTTON TIP 6IN STRL (MISCELLANEOUS) ×2 IMPLANT
APPLIER CLIP 5 13 M/L LIGAMAX5 (MISCELLANEOUS)
APPLIER CLIP ROT 10 11.4 M/L (STAPLE)
APPLIER CLIP ROT 13.4 12 LRG (CLIP)
APR CLP LRG 13.4X12 ROT 20 MLT (CLIP)
APR CLP MED LRG 11.4X10 (STAPLE)
APR CLP MED LRG 5 ANG JAW (MISCELLANEOUS)
BLADE SURG 15 STRL LF DISP TIS (BLADE) ×2 IMPLANT
BLADE SURG 15 STRL SS (BLADE) ×4
CABLE HIGH FREQUENCY MONO STRZ (ELECTRODE) ×2 IMPLANT
CLIP APPLIE 5 13 M/L LIGAMAX5 (MISCELLANEOUS) IMPLANT
CLIP APPLIE ROT 10 11.4 M/L (STAPLE) IMPLANT
CLIP APPLIE ROT 13.4 12 LRG (CLIP) IMPLANT
DEVICE SUT QUICK LOAD TK 5 (STAPLE) ×2 IMPLANT
DEVICE SUT TI-KNOT TK 5X26 (MISCELLANEOUS) ×1 IMPLANT
DEVICE SUTURE ENDOST 10MM (ENDOMECHANICALS) ×2 IMPLANT
DEVICE TI KNOT TK5 (MISCELLANEOUS) ×1
DEVICE TROCAR PUNCTURE CLOSURE (ENDOMECHANICALS) ×4 IMPLANT
DISSECTOR BLUNT TIP ENDO 5MM (MISCELLANEOUS) ×4 IMPLANT
DRAPE CAMERA CLOSED 9X96 (DRAPES) ×2 IMPLANT
ELECT REM PT RETURN 9FT ADLT (ELECTROSURGICAL) ×4
ELECTRODE REM PT RTRN 9FT ADLT (ELECTROSURGICAL) ×2 IMPLANT
GAUZE SPONGE 4X4 12PLY STRL (GAUZE/BANDAGES/DRESSINGS) IMPLANT
GLOVE BIOGEL M 8.0 STRL (GLOVE) ×4 IMPLANT
GOWN STRL REUS W/TWL XL LVL3 (GOWN DISPOSABLE) ×16 IMPLANT
HANDLE STAPLE EGIA 4 XL (STAPLE) ×4 IMPLANT
HOVERMATT SINGLE USE (MISCELLANEOUS) ×4 IMPLANT
KIT BASIN OR (CUSTOM PROCEDURE TRAY) ×4 IMPLANT
LIQUID BAND (GAUZE/BANDAGES/DRESSINGS) ×2 IMPLANT
NDL SPNL 22GX3.5 QUINCKE BK (NEEDLE) ×2 IMPLANT
NEEDLE SPNL 22GX3.5 QUINCKE BK (NEEDLE) ×4 IMPLANT
PACK UNIVERSAL I (CUSTOM PROCEDURE TRAY) ×4 IMPLANT
PEN SKIN MARKING BROAD (MISCELLANEOUS) ×4 IMPLANT
QUICK LOAD TK 5 (STAPLE) ×2
RELOAD STAPLE 45 PURP MED/THCK (STAPLE) IMPLANT
RELOAD TRI 45 ART MED THCK BLK (STAPLE) ×4 IMPLANT
RELOAD TRI 45 ART MED THCK PUR (STAPLE) IMPLANT
RELOAD TRI 60 ART MED THCK BLK (STAPLE) ×4 IMPLANT
RELOAD TRI 60 ART MED THCK PUR (STAPLE) ×8 IMPLANT
SCISSORS LAP 5X45 EPIX DISP (ENDOMECHANICALS) ×2 IMPLANT
SCRUB PCMX 4 OZ (MISCELLANEOUS) ×6 IMPLANT
SEALANT SURGICAL APPL DUAL CAN (MISCELLANEOUS) IMPLANT
SET IRRIG TUBING LAPAROSCOPIC (IRRIGATION / IRRIGATOR) ×4 IMPLANT
SHEARS CURVED HARMONIC AC 45CM (MISCELLANEOUS) ×4 IMPLANT
SLEEVE ADV FIXATION 5X100MM (TROCAR) ×8 IMPLANT
SLEEVE GASTRECTOMY 36FR VISIGI (MISCELLANEOUS) ×4 IMPLANT
SOLUTION ANTI FOG 6CC (MISCELLANEOUS) ×4 IMPLANT
SPONGE LAP 18X18 X RAY DECT (DISPOSABLE) ×4 IMPLANT
STAPLER VISISTAT 35W (STAPLE) ×4 IMPLANT
SUT SURGIDAC NAB ES-9 0 48 120 (SUTURE) ×4 IMPLANT
SUT VIC AB 4-0 SH 18 (SUTURE) ×4 IMPLANT
SUT VICRYL 0 TIES 12 18 (SUTURE) ×4 IMPLANT
SYR 20CC LL (SYRINGE) ×4 IMPLANT
SYR 50ML LL SCALE MARK (SYRINGE) ×4 IMPLANT
TOWEL OR 17X26 10 PK STRL BLUE (TOWEL DISPOSABLE) ×8 IMPLANT
TOWEL OR NON WOVEN STRL DISP B (DISPOSABLE) ×4 IMPLANT
TRAY FOLEY CATH 14FRSI W/METER (CATHETERS) IMPLANT
TROCAR ADV FIXATION 12X100MM (TROCAR) ×4 IMPLANT
TROCAR ADV FIXATION 5X100MM (TROCAR) ×4 IMPLANT
TROCAR BLADELESS 15MM (ENDOMECHANICALS) ×4 IMPLANT
TROCAR BLADELESS OPT 5 100 (ENDOMECHANICALS) ×4 IMPLANT
TUBE CALIBRATION LAPBAND (TUBING) ×2 IMPLANT
TUBING CONNECTING 10 (TUBING) ×3 IMPLANT
TUBING CONNECTING 10' (TUBING) ×1
TUBING ENDO SMARTCAP (MISCELLANEOUS) ×4 IMPLANT
TUBING FILTER THERMOFLATOR (ELECTROSURGICAL) ×2 IMPLANT

## 2014-11-05 NOTE — Anesthesia Preprocedure Evaluation (Addendum)
Anesthesia Evaluation  Patient identified by MRN, date of birth, ID band Patient awake    Reviewed: Allergy & Precautions, NPO status , Patient's Chart, lab work & pertinent test results  History of Anesthesia Complications (+) PONVNegative for: history of anesthetic complications  Airway Mallampati: II  TM Distance: >3 FB Neck ROM: Full    Dental  (+) Teeth Intact, Dental Advisory Given   Pulmonary neg pulmonary ROS,    Pulmonary exam normal       Cardiovascular hypertension, Pt. on medications     Neuro/Psych negative neurological ROS  negative psych ROS   GI/Hepatic negative GI ROS, Neg liver ROS,   Endo/Other  diabetesMorbid obesity  Renal/GU negative Renal ROS     Musculoskeletal   Abdominal   Peds  Hematology   Anesthesia Other Findings   Reproductive/Obstetrics                            Anesthesia Physical Anesthesia Plan  ASA: III  Anesthesia Plan: MAC   Post-op Pain Management:    Induction: Intravenous  Airway Management Planned: Oral ETT  Additional Equipment:   Intra-op Plan:   Post-operative Plan: Extubation in OR  Informed Consent: I have reviewed the patients History and Physical, chart, labs and discussed the procedure including the risks, benefits and alternatives for the proposed anesthesia with the patient or authorized representative who has indicated his/her understanding and acceptance.   Dental advisory given  Plan Discussed with: CRNA, Anesthesiologist and Surgeon  Anesthesia Plan Comments:        Anesthesia Quick Evaluation

## 2014-11-05 NOTE — Anesthesia Procedure Notes (Signed)
Procedure Name: Intubation Date/Time: 11/05/2014 7:15 AM Performed by: Lajuana Carry E Pre-anesthesia Checklist: Patient identified, Emergency Drugs available, Suction available and Patient being monitored Patient Re-evaluated:Patient Re-evaluated prior to inductionOxygen Delivery Method: Circle System Utilized Preoxygenation: Pre-oxygenation with 100% oxygen Intubation Type: IV induction Ventilation: Mask ventilation without difficulty Laryngoscope Size: Mac and 3 Grade View: Grade II Tube type: Oral Tube size: 7.0 mm Number of attempts: 1 Airway Equipment and Method: Stylet Placement Confirmation: ETT inserted through vocal cords under direct vision,  positive ETCO2 and breath sounds checked- equal and bilateral Secured at: 21 cm Tube secured with: Tape Dental Injury: Teeth and Oropharynx as per pre-operative assessment

## 2014-11-05 NOTE — Op Note (Signed)
Name:  [name] MRN: [id number] Date of Surgery: 11/05/2014  Preop Diagnosis:  Morbid Obesity  Postop Diagnosis:  Morbid Obesity (Weight - 219, BMI - 38.8) , S/P Gastric Sleeve  Procedure:  Upper endoscopy  (Intraoperative)  Surgeon:  Alphonsa Overall, M.D.  Anesthesia:  GET  Indications for procedure: Shannon Soto is a 44 y.o. female whose primary care physician is Christianne Borrow, MD and has completed a Gastric Sleeve today by Dr. Hassell Done.  I am doing an intraoperative upper endoscopy to evaluate the gastric pouch.  Operative Note: The patient is under general anesthesia.  Dr. Hassell Done is laparoscoping the patient while I do an upper endoscopy to evaluate the stomach pouch.  With the patient intubated, I passed the Pentax upper endoscope without difficulty down the esophagus.  The esophago-gastric junction was at 37 cm.    The mucosa of the stomach looked viable and the staple line was intact without bleeding.  I advanced to the pylorus, but did not go through it.  While I insufflated the stomach pouch with air, Dr. Hassell Done  flooded the upper abdomen with saline to put the gastric pouch under saline.  There was no bubbling or evidence of a leak.  There was no evidence of narrowing of the pouch and the gastric sleeve looked tubular.  The scope was then withdrawn.  The esophagus was unremarkable and the patient tolerated the endoscopy without difficulty.  Alphonsa Overall, MD, [facility] Surgery Pager: [phone] Office phone:  [phone]

## 2014-11-05 NOTE — Transfer of Care (Signed)
Immediate Anesthesia Transfer of Care Note  Patient: Shannon Soto  Procedure(s) Performed: Procedure(s): LAPAROSCOPIC GASTRIC SLEEVE RESECTION (N/A) LAPAROSCOPIC REPAIR OF HIATAL HERNIA UPPER GI ENDOSCOPY  Patient Location: PACU  Anesthesia Type:General  Level of Consciousness: awake, sedated and responds to stimulation  Airway & Oxygen Therapy: Patient Spontanous Breathing and Patient connected to face mask oxygen  Post-op Assessment: Report given to RN and Post -op Vital signs reviewed and stable  Post vital signs: Reviewed and stable  Last Vitals:  Filed Vitals:   11/05/14 0526  BP: 126/75  Pulse: 70  Temp: 36.4 C  Resp: 18    Complications: No apparent anesthesia complications

## 2014-11-05 NOTE — H&P (View-Only) (Signed)
Shannon Soto 07/05/2014 10:15 AM Location: [facility] Patient #: [id number] DOB: 12/02/1970 Single / Language: [name] / Race: Black or African American Female  History of Present Illness [name] MD; 07/05/2014 11:00 AM) Patient words: bariatric consult  Shannon Soto is a 44 year old AA female who works in the Associate Professor at [employer]. She's been to our seminar and comes in interested in a sleeve gastrectomy. She is followed by Dr.Krichna Sowles in [place] she lives in [place]. Her BMI is 38.79 and she has type 2 diabetes, hypercholesterolemia, and hypertension. She has demonstrated weight loss and improvement in her comorbidities but then has weight recidivism. She hasn't researched this and wants to have a sleeve gastrectomy. I did talk to her about the benefits of the gastric bypass with type 2 diabetes and I think if she were to reconsider we could always move in that direction. For now we will try to get her approved for a sleeve gastrectomy.  She is tried multiple weight loss plans again with some degrees of success but always regaining weight. She seems like a very motivated lady, who is well-informed and most proceed with bariatric surgery at this time and I think she would be a very good candidate.  The patient is a 44 year old female    Other Problems [name], [place]; 07/05/2014 10:17 AM) Diabetes Mellitus Gastroesophageal Reflux Disease Hemorrhoids High blood pressure Hypercholesterolemia  Past Surgical History [name], [place]; 07/05/2014 10:17 AM) Appendectomy Hysterectomy (not due to cancer) - Partial  Diagnostic Studies History [name], CMA; 07/05/2014 10:17 AM) Colonoscopy 1-5 years ago Mammogram within last year Pap Smear 1-5 years ago  Allergies [name], CMA; 07/05/2014 10:19 AM) No Known Drug Allergies11/07/2014  Medication History ([name], CMA; 07/05/2014 10:19 AM) AmLODIPine  Besylate (10MG  Tablet, Oral) Active. FLUoxetine HCl (10MG  Capsule, Oral) Active. Gabapentin (600MG  Tablet, Oral) Active. Ibuprofen (800MG  Tablet, Oral) Active. Losartan Potassium (25MG  Tablet, Oral) Active. MetFORMIN HCl ER (500MG  Tablet ER 24HR, Oral) Active. Pravastatin Sodium (40MG  Tablet, Oral) Active. Ranitidine HCl (300MG  Tablet, Oral) Active. Zolpidem Tartrate (10MG  Tablet, Oral) Active.  Social History [name], [place]; 07/05/2014 10:17 AM) Alcohol use Occasional alcohol use. Caffeine use Tea. No drug use Tobacco use Never smoker.  Family History [name], [place]; 07/05/2014 10:17 AM) Diabetes Mellitus Father. Hypertension Father, Mother, Son.  Pregnancy / Birth History [name], [place]; 07/05/2014 10:17 AM) Age at menarche 70 years. Gravida 4 Maternal age 8-25 Para 3  Review of Systems ([place]; 07/05/2014 10:17 AM) General Present- Weight Gain. Not Present- Appetite Loss, Chills, Fatigue, Fever, Night Sweats and Weight Loss. Skin Not Present- Change in Wart/Mole, Dryness, Hives, Jaundice, New Lesions, Non-Healing Wounds, Rash and Ulcer. HEENT Not Present- Earache, Hearing Loss, Hoarseness, Nose Bleed, Oral Ulcers, Ringing in the Ears, Seasonal Allergies, Sinus Pain, Sore Throat, Visual Disturbances, Wears glasses/contact lenses and Yellow Eyes. Respiratory Not Present- Bloody sputum, Chronic Cough, Difficulty Breathing, Snoring and Wheezing. Breast Not Present- Breast Mass, Breast Pain, Nipple Discharge and Skin Changes. Cardiovascular Present- Leg Cramps. Not Present- Chest Pain, Difficulty Breathing Lying Down, Palpitations, Rapid Heart Rate, Shortness of Breath and Swelling of Extremities. Gastrointestinal Present- Bloating, Constipation and Hemorrhoids. Not Present- Abdominal Pain, Bloody Stool, Change in Bowel Habits, Chronic diarrhea, Difficulty Swallowing, Excessive gas, Gets full quickly at meals, Indigestion, Nausea, Rectal Pain and  Vomiting. Female Genitourinary Not Present- Frequency, Nocturia, Painful Urination, Pelvic Pain and Urgency. Neurological Present- Headaches. Not Present- Decreased Memory, Fainting, Numbness,  Seizures, Tingling, Tremor, Trouble walking and Weakness. Psychiatric Not Present- Anxiety, Bipolar, Change in Sleep Pattern, Depression, Fearful and Frequent crying. Endocrine Present- Hot flashes. Not Present- Cold Intolerance, Excessive Hunger, Hair Changes, Heat Intolerance and New Diabetes. Hematology Not Present- Easy Bruising, Excessive bleeding, Gland problems, HIV and Persistent Infections.   Vitals ([name] CMA; 07/05/2014 10:20 AM) 07/05/2014 10:19 AM Weight: 219 lb Height: 63in Body Surface Area: 2.1 m Body Mass Index: 38.79 kg/m Temp.: 82F(Temporal)  Pulse: 81 (Regular)  BP: 132/76 (Sitting, Left Arm, Standard)    Physical Exam (Musa Rewerts B. Hassell Done MD; 07/05/2014 11:02 AM) The physical exam findings are as follows: Note:Well-developed, well-nourished, African-American female in no acute distress HEENT exam unremarkable. Neck supple Chest clear to auscultation Heart sinus rhythm without murmurs or gallops. Abdomen obese nontender. Prior partial hysterectomy. Extremity exam for range of motion. No history of DVT. Neuro alert and oriented 3. Motor and sensory function grossly intact.    Assessment & Plan [name] MD; 07/05/2014 11:03 AM) Virgina Evener OBESITY (278.01  E66.01) Impression: begin workup for sleeve gastrectomy Current Plans  CBC, PLATELETS & AUT DIFF (41287) METABOLIC PANEL, COMPREHENSIVE (80053) TSH (THYROID STIMULATING HORMONE) (86767) TT4 (THYROXINE TOTAL) (20947) HEMOGLOBIN GLYCLATED (HGB S9G) (28366) HELICOBACTER PYLORI ANTIBODY (29476) H. PYLORI ANALYSIS UREASE ACTIVITY (83013) LIPID PANEL (54650) PT (PROTHROMBIN TIME) (35465) URINALYSIS (81005) IRON (83540) IRON BINDING CAPACITY (TIBC) (83550) FERRITIN (68127) VITAMIN B-12  (CYANOCOBALAMIN) (51700) VITAMIN D, 1, 17-CBSWHQPRF (16384) FOLIC ACID SERUM (66599) Referred to Psychology, for evaluation and follow up (Psychologist). Referred to Nutritional Counseling, for evaluation and follow up (Dietician/Health Nutritionist). COMPLETE ABDOMINAL ULTRASOUND (35701) CHEST XRAY, PA & LATERAL (77939) UGI W/ KUB (03009) MAMMOGRAM BREAST BILATERAL DIAGNOSTIC (23300) EKG (93000) I discussed sleeve gastrectomy with the patient to include the risks and benefits to include, but not limited to: infection, bleeding, damage to surrounding structures, chronic wound healing, nerve pain, leaks and bleeding. . The patient voiced understanding and wishes to proceed at this time.  UGI was normal.    Plan sleeve gastrectomy  Matt B. Hassell Done, MD, [facility] [phone] beeper [phone]  10/18/2014 12:44 PM

## 2014-11-05 NOTE — Interval H&P Note (Signed)
History and Physical Interval Note:  11/05/2014 6:57 AM  Shannon Soto  has presented today for surgery, with the diagnosis of MORBID OBESITY  The various methods of treatment have been discussed with the patient and family. After consideration of risks, benefits and other options for treatment, the patient has consented to  Procedure(s): LAPAROSCOPIC GASTRIC SLEEVE RESECTION (N/A) as a surgical intervention .  The patient's history has been reviewed, patient examined, no change in status, stable for surgery.  I have reviewed the patient's chart and labs.  Questions were answered to the patient's satisfaction.     Katsumi Wisler B

## 2014-11-05 NOTE — Progress Notes (Signed)
Pt complaining of nausea and pt vomited a small amount of dark red liquid. MD on call notified. New orders received. Will continue to monitor.

## 2014-11-05 NOTE — Op Note (Signed)
Surgeon: Kaylyn Lim, MD, FACS  Asst:  Alphonsa Overall, MD,FACS  Anes:  General endotracheal  Procedure: Laparoscopic sleeve gastrectomy, 2 suture posterior repair of hiatal hernia and upper endoscopy  Diagnosis: Morbid obesity  Complications: none  EBL:   10 cc  Description of Procedure:  The patient was take to OR 1 and given general anesthesia.  The abdomen was prepped with PCMX and draped sterilely.  A timeout was performed.  Access to the abdomen was achieved with the 5 mm Optiview through the right upper quadrant.  Four other 5 mm trocars and a 15 mm was placed inferiorally to the right of the midline.    Following insufflation, the state of the abdomen was found to be free of adhesions. The calibration tubing with 10 cc of air was positive for a hiatus hernia.  The hernia was dissected posteriorally and repaired with two sutures secured with the TY knot.   The ViSiGi 36Fr tube was inserted.   The pylorus was identified and we measured 5 cm back and marked the antrum.  At that point we began dissection to take down the greater curvature of the stomach using the Harmonic scalpel.  This dissection was taken all the way up to the left crus.  Posterior attachments of the stomach were also taken down.    The ViSiGi tube was then passed into the antrum and suction applied so that it was snug along the lessor curvature.  The "crow's foot" or incisura was identified.  The sleeve gastrectomy was begun using the [employer] stapler beginning with a 4.5 cm black load with buttress followed by a 6 cm black load with buttress.  The sleeve was completed with multiple firings of the 6 cm purple with buttress .  When the sleeve was complete the tube was taken off suction and insufflated briefly.  The tube was withdrawn.  Upper endoscopy was then performed by Dr. Lucia Gaskins.     The specimen was extracted through the 15 trocar site.  Wounds were infiltrated with 30 cc diluted Exparel and closed with 4-0  vicryl and liquiband.  The 15 mm trocar site was closed with the endoclose using a 0 vicryl.    Shannon Soto Done, [place], [facility], [place]

## 2014-11-05 NOTE — Anesthesia Postprocedure Evaluation (Signed)
Anesthesia Post Note  Patient: Shannon Soto  Procedure(s) Performed: Procedure(s) (LRB): LAPAROSCOPIC GASTRIC SLEEVE RESECTION (N/A) LAPAROSCOPIC REPAIR OF HIATAL HERNIA UPPER GI ENDOSCOPY  Anesthesia type: general  Patient location: PACU  Post pain: Pain level controlled  Post assessment: Patient's Cardiovascular Status Stable  Last Vitals:  Filed Vitals:   11/05/14 1045  BP: 134/86  Pulse: 72  Temp: 36.3 C  Resp: 18    Post vital signs: Reviewed and stable  Level of consciousness: sedated  Complications: No apparent anesthesia complications

## 2014-11-06 ENCOUNTER — Inpatient Hospital Stay (HOSPITAL_COMMUNITY): Payer: 59

## 2014-11-06 ENCOUNTER — Encounter (HOSPITAL_COMMUNITY): Payer: Self-pay | Admitting: Surgery

## 2014-11-06 LAB — CBC WITH DIFFERENTIAL/PLATELET
Basophils Absolute: 0 10*3/uL (ref 0.0–0.1)
Basophils Relative: 0 % (ref 0–1)
EOS PCT: 0 % (ref 0–5)
Eosinophils Absolute: 0 10*3/uL (ref 0.0–0.7)
HCT: 38.3 % (ref 36.0–46.0)
Hemoglobin: 11.7 g/dL — ABNORMAL LOW (ref 12.0–15.0)
LYMPHS ABS: 1.5 10*3/uL (ref 0.7–4.0)
Lymphocytes Relative: 11 % — ABNORMAL LOW (ref 12–46)
MCH: 23.8 pg — ABNORMAL LOW (ref 26.0–34.0)
MCHC: 30.5 g/dL (ref 30.0–36.0)
MCV: 78 fL (ref 78.0–100.0)
Monocytes Absolute: 1.3 10*3/uL — ABNORMAL HIGH (ref 0.1–1.0)
Monocytes Relative: 9 % (ref 3–12)
NEUTROS ABS: 11.1 10*3/uL — AB (ref 1.7–7.7)
Neutrophils Relative %: 80 % — ABNORMAL HIGH (ref 43–77)
Platelets: 420 10*3/uL — ABNORMAL HIGH (ref 150–400)
RBC: 4.91 MIL/uL (ref 3.87–5.11)
RDW: 16.1 % — ABNORMAL HIGH (ref 11.5–15.5)
WBC: 13.9 10*3/uL — ABNORMAL HIGH (ref 4.0–10.5)

## 2014-11-06 MED ORDER — IOHEXOL 300 MG/ML  SOLN
50.0000 mL | Freq: Once | INTRAMUSCULAR | Status: AC | PRN
  Administered 2014-11-06: 15 mL via ORAL

## 2014-11-06 NOTE — Consult Note (Signed)
Came to see patient on behalf of Link to Wellness program. She was on the way down for a procedure per nursing. Will come back at later time.  Marthenia Rolling, MSN- [facility] [phone]

## 2014-11-06 NOTE — Progress Notes (Signed)
Spoke with Dr. Hassell Done re:UGI results.  Dr. Hassell Done wants to proceed with POD1 diet, ice chips and water 2oz every 4 hours, but proceed slowly.  Informed nurse to start POD 1 diet, proceed slowly and monitor vital signs, HR, BP, as well as pain and nausea.

## 2014-11-06 NOTE — Plan of Care (Signed)
Problem: Food- and Nutrition-Related Knowledge Deficit (NB-1.1) Goal: Nutrition education Formal process to instruct or train a patient/client in a skill or to impart knowledge to help patients/clients voluntarily manage or modify food choices and eating behavior to maintain or improve health. Outcome: Completed/Met Date Met:  11/06/14 Nutrition Education Note  Received consult for diet education per DROP protocol.   Discussed 2 week post op diet with pt. Emphasized that liquids must be non carbonated, non caffeinated, and sugar free. Fluid goals discussed. Pt to follow up with outpatient bariatric RD for further diet progression after 2 weeks. Multivitamins and minerals also reviewed. Teach back method used, pt expressed understanding, expect good compliance.   Diet: First 2 Weeks  You will see the nutritionist about two (2) weeks after your surgery. The nutritionist will increase the types of foods you can eat if you are handling liquids well:  If you have severe vomiting or nausea and cannot handle clear liquids lasting longer than 1 day, call your surgeon  Protein Shake  Drink at least 2 ounces of shake 5-6 times per day  Each serving of protein shakes (usually 8 - 12 ounces) should have a minimum of:  15 grams of protein  And no more than 5 grams of carbohydrate  Goal for protein each day:  Men = 80 grams per day  Women = 60 grams per day  Protein powder may be added to fluids such as non-fat milk or Lactaid milk or Soy milk (limit to 35 grams added protein powder per serving)   Hydration  Slowly increase the amount of water and other clear liquids as tolerated (See Acceptable Fluids)  Slowly increase the amount of protein shake as tolerated  Sip fluids slowly and throughout the day  May use sugar substitutes in small amounts (no more than 6 - 8 packets per day; i.e. Splenda)   Fluid Goal  The first goal is to drink at least 8 ounces of protein shake/drink per day (or as directed  by the nutritionist); some examples of protein shakes are Syntrax Nectar, Adkins Advantage, EAS Edge HP, and Unjury. See handout from pre-op Bariatric Education Class:  Slowly increase the amount of protein shake you drink as tolerated  You may find it easier to slowly sip shakes throughout the day  It is important to get your proteins in first  Your fluid goal is to drink 64 - 100 ounces of fluid daily  It may take a few weeks to build up to this  32 oz (or more) should be clear liquids  And  32 oz (or more) should be full liquids (see below for examples)  Liquids should not contain sugar, caffeine, or carbonation   Clear Liquids:  Water or Sugar-free flavored water (i.e. Fruit H2O, Propel)  Decaffeinated coffee or tea (sugar-free)  Crystal Lite, Wyler's Lite, Minute Maid Lite  Sugar-free Jell-O  Bouillon or broth  Sugar-free Popsicle: *Less than 20 calories each; Limit 1 per day   Full Liquids:  Protein Shakes/Drinks + 2 choices per day of other full liquids  Full liquids must be:  No More Than 12 grams of Carbs per serving  No More Than 3 grams of Fat per serving  Strained low-fat cream soup  Non-Fat milk  Fat-free Lactaid Milk  Sugar-free yogurt (Dannon Lite & Fit, Greek yogurt)   Talibah Colasurdo MS, RD, LDN [phone]     

## 2014-11-06 NOTE — Consult Note (Signed)
Came to visit patient at bedside to discuss Link to Wellness program for DM management. Patient reports she is familiar with the Link to Wellness program at [facility]. Reports she plans on following up at some point after discharge. Left packet at bedside along with contact information. Appreciative of visit. Made inpatient RNCM aware.  Marthenia Rolling, MSN- [facility] [phone]

## 2014-11-06 NOTE — Progress Notes (Signed)
Patient ID: Shannon Soto, female   DOB: [birth year], 44 y.o.   MRN: [id number] [facility] Progress Note:   1 Day Post-Op  Subjective: Mental status is clear. Nauseated during the night Objective: Vital signs in last 24 hours: Temp:  [98 F (36.7 C)-98.4 F (36.9 C)] 98.4 F (36.9 C) (03/15 1400) Pulse Rate:  [68-78] 68 (03/15 1400) Resp:  [16-20] 20 (03/15 1400) BP: (105-161)/(52-87) 161/87 mmHg (03/15 1400) SpO2:  [93 %-100 %] 98 % (03/15 1400)  Intake/Output from previous day: 03/14 0701 - 03/15 0700 In: 2945.8 [I.V.:2945.8] Out: 1950 [Urine:1950] Intake/Output this shift: Total I/O In: -  Out: 700 [Urine:700]  Physical Exam: Work of breathing is normal.    Lab Results:  Results for orders placed or performed during the hospital encounter of 11/05/14 (from the past 48 hour(s))  Glucose, capillary     Status: Abnormal   Collection Time: 11/05/14  5:28 AM  Result Value Ref Range   Glucose-Capillary 100 (H) 70 - 99 mg/dL   Comment 1 Notify RN   Glucose, capillary     Status: Abnormal   Collection Time: 11/05/14  9:46 AM  Result Value Ref Range   Glucose-Capillary 186 (H) 70 - 99 mg/dL  CBC     Status: Abnormal   Collection Time: 11/05/14 12:35 PM  Result Value Ref Range   WBC 16.5 (H) 4.0 - 10.5 K/uL   RBC 4.76 3.87 - 5.11 MIL/uL   Hemoglobin 11.7 (L) 12.0 - 15.0 g/dL   HCT 37.6 36.0 - 46.0 %   MCV 79.0 78.0 - 100.0 fL   MCH 24.6 (L) 26.0 - 34.0 pg   MCHC 31.1 30.0 - 36.0 g/dL   RDW 16.0 (H) 11.5 - 15.5 %   Platelets 284 150 - 400 K/uL  Creatinine, serum     Status: None   Collection Time: 11/05/14 12:35 PM  Result Value Ref Range   Creatinine, Ser 0.75 0.50 - 1.10 mg/dL   GFR calc non Af Amer >90 >90 mL/min   GFR calc Af Amer >90 >90 mL/min    Comment: (NOTE) The eGFR has been calculated using the CKD EPI equation. This calculation has not been validated in all clinical situations. eGFR's persistently <90 mL/min signify possible Chronic  Kidney Disease.   CBC WITH DIFFERENTIAL     Status: Abnormal   Collection Time: 11/06/14  5:00 AM  Result Value Ref Range   WBC 13.9 (H) 4.0 - 10.5 K/uL    Comment: WHITE COUNT CONFIRMED ON SMEAR   RBC 4.91 3.87 - 5.11 MIL/uL   Hemoglobin 11.7 (L) 12.0 - 15.0 g/dL   HCT 38.3 36.0 - 46.0 %   MCV 78.0 78.0 - 100.0 fL   MCH 23.8 (L) 26.0 - 34.0 pg   MCHC 30.5 30.0 - 36.0 g/dL   RDW 16.1 (H) 11.5 - 15.5 %   Platelets 420 (H) 150 - 400 K/uL    Comment: RESULT REPEATED AND VERIFIED DELTA CHECK NOTED    Neutrophils Relative % 80 (H) 43 - 77 %   Lymphocytes Relative 11 (L) 12 - 46 %   Monocytes Relative 9 3 - 12 %   Eosinophils Relative 0 0 - 5 %   Basophils Relative 0 0 - 1 %   Neutro Abs 11.1 (H) 1.7 - 7.7 K/uL   Lymphs Abs 1.5 0.7 - 4.0 K/uL   Monocytes Absolute 1.3 (H) 0.1 - 1.0 K/uL   Eosinophils Absolute 0.0 0.0 - 0.7  K/uL   Basophils Absolute 0.0 0.0 - 0.1 K/uL   Smear Review MORPHOLOGY UNREMARKABLE     Radiology/Results: Dg Ugi W/water Sol Cm  11/06/2014   CLINICAL DATA:  44 year old female status post laparoscopic sleeve gastrectomy.  EXAM: UPPER GI SERIES WITH KUB  TECHNIQUE: After obtaining a scout radiograph a routine upper GI series was performed using 50 mL of Omnipaque  FLUOROSCOPY TIME:  If the device does not provide the exposure index:  Fluoroscopy Time (in minutes and seconds):  2 minutes and 50 seconds  Number of Acquired Images:  25  COMPARISON:  08/31/2014  FINDINGS: Initial KUB demonstrated 2 suture lines in the epigastric region and left upper quadrant, related to recent sleeve gastrectomy. Water-soluble contrast was administered orally, which rapidly extended into the stomach. Upon entering the stomach, the contrast pooled proximally, in a somewhat amorphous distribution after in initially bifurcating adjacent to the suture line. Contrast remained in the proximal aspect of the stomach throughout the procedure, particularly along the lesser curvature where there was  an amorphous pool of contrast throughout the procedure. No contrast traversed through the area of the sleeve gastrectomy, and no contrast entered the proximal small bowel.  IMPRESSION: 1. Abnormal postoperative appearance following sleeve gastrectomy, as discussed above. At this time, this is favored to be related to postoperative edema adjacent to the suture line, with pooling of contrast in the proximal gastric remnant. Clinical correlation and repeat study is suggested in the next 24-48 hours to re-evaluate this finding. These results were called by telephone at the time of interpretation on 11/06/2014 at 10:47 am to Dr. Johnathan Hausen, who verbally acknowledged these results.   Electronically Signed   By: Vinnie Langton M.D.   On: 11/06/2014 10:50    Anti-infectives: Anti-infectives    Start     Dose/Rate Route Frequency Ordered Stop   11/05/14 0538  cefOXitin (MEFOXIN) 2 g in dextrose 5 % 50 mL IVPB     2 g 100 mL/hr over 30 Minutes Intravenous On call to O.R. 11/05/14 0538 11/05/14 0710      Assessment/Plan: Problem List: Patient Active Problem List   Diagnosis Date Noted  . S/P laparoscopic sleeve gastrectomy March 2016 11/05/2014    UGI reviewed.  Posterior pouch with little trickling down sleeve.  This looked fine at time of endscopy.  Will offer water and observe.   1 Day Post-Op    LOS: 1 day   Matt B. Hassell Done, MD, [facility] [phone] beeper [phone]  11/06/2014 3:27 PM

## 2014-11-06 NOTE — Progress Notes (Signed)
Patient alert and oriented, Post op day 1.  Provided support and encouragement.  Encouraged pulmonary toilet, ambulation and small sips of liquids when swallow study returned satisfactorily.  All questions answered.  Will continue to monitor. 

## 2014-11-07 LAB — CBC WITH DIFFERENTIAL/PLATELET
Basophils Absolute: 0 10*3/uL (ref 0.0–0.1)
Basophils Relative: 0 % (ref 0–1)
EOS PCT: 0 % (ref 0–5)
Eosinophils Absolute: 0.1 10*3/uL (ref 0.0–0.7)
HCT: 40.8 % (ref 36.0–46.0)
Hemoglobin: 12.2 g/dL (ref 12.0–15.0)
LYMPHS ABS: 2.3 10*3/uL (ref 0.7–4.0)
LYMPHS PCT: 19 % (ref 12–46)
MCH: 23.7 pg — ABNORMAL LOW (ref 26.0–34.0)
MCHC: 29.9 g/dL — ABNORMAL LOW (ref 30.0–36.0)
MCV: 79.2 fL (ref 78.0–100.0)
MONO ABS: 1 10*3/uL (ref 0.1–1.0)
Monocytes Relative: 8 % (ref 3–12)
Neutro Abs: 8.7 10*3/uL — ABNORMAL HIGH (ref 1.7–7.7)
Neutrophils Relative %: 73 % (ref 43–77)
Platelets: 425 10*3/uL — ABNORMAL HIGH (ref 150–400)
RBC: 5.15 MIL/uL — AB (ref 3.87–5.11)
RDW: 16.4 % — ABNORMAL HIGH (ref 11.5–15.5)
WBC: 12 10*3/uL — ABNORMAL HIGH (ref 4.0–10.5)

## 2014-11-07 NOTE — Progress Notes (Signed)
Patient ID: Shannon Soto, female   DOB: 20-Aug-1971, 44 y.o.   MRN: [id number] [facility] Surgery Progress Note:   2 Days Post-Op  Subjective: Mental status is clear.  Less nauseated during the night and taking sips of water and ice chips.  No vomiting reported.   Objective: Vital signs in last 24 hours: Temp:  [98.4 F (36.9 C)-98.7 F (37.1 C)] 98.7 F (37.1 C) (03/16 0600) Pulse Rate:  [65-72] 72 (03/16 0600) Resp:  [20] 20 (03/16 0600) BP: (124-161)/(60-87) 125/65 mmHg (03/16 0600) SpO2:  [95 %-98 %] 97 % (03/16 0600) Weight:  [212 lb 8.4 oz (96.4 kg)] 212 lb 8.4 oz (96.4 kg) (03/16 0300)  Intake/Output from previous day: 03/15 0701 - 03/16 0700 In: 3000 [I.V.:3000] Out: 4000 [Urine:4000] Intake/Output this shift:    Physical Exam: Work of breathing is not labored  Lab Results:  Results for orders placed or performed during the hospital encounter of 11/05/14 (from the past 48 hour(s))  Glucose, capillary     Status: Abnormal   Collection Time: 11/05/14  9:46 AM  Result Value Ref Range   Glucose-Capillary 186 (H) 70 - 99 mg/dL  CBC     Status: Abnormal   Collection Time: 11/05/14 12:35 PM  Result Value Ref Range   WBC 16.5 (H) 4.0 - 10.5 K/uL   RBC 4.76 3.87 - 5.11 MIL/uL   Hemoglobin 11.7 (L) 12.0 - 15.0 g/dL   HCT 37.6 36.0 - 46.0 %   MCV 79.0 78.0 - 100.0 fL   MCH 24.6 (L) 26.0 - 34.0 pg   MCHC 31.1 30.0 - 36.0 g/dL   RDW 16.0 (H) 11.5 - 15.5 %   Platelets 284 150 - 400 K/uL  Creatinine, serum     Status: None   Collection Time: 11/05/14 12:35 PM  Result Value Ref Range   Creatinine, Ser 0.75 0.50 - 1.10 mg/dL   GFR calc non Af Amer >90 >90 mL/min   GFR calc Af Amer >90 >90 mL/min    Comment: (NOTE) The eGFR has been calculated using the CKD EPI equation. This calculation has not been validated in all clinical situations. eGFR's persistently <90 mL/min signify possible Chronic Kidney Disease.   CBC WITH DIFFERENTIAL     Status: Abnormal   Collection Time: 11/06/14  5:00 AM  Result Value Ref Range   WBC 13.9 (H) 4.0 - 10.5 K/uL    Comment: WHITE COUNT CONFIRMED ON SMEAR   RBC 4.91 3.87 - 5.11 MIL/uL   Hemoglobin 11.7 (L) 12.0 - 15.0 g/dL   HCT 38.3 36.0 - 46.0 %   MCV 78.0 78.0 - 100.0 fL   MCH 23.8 (L) 26.0 - 34.0 pg   MCHC 30.5 30.0 - 36.0 g/dL   RDW 16.1 (H) 11.5 - 15.5 %   Platelets 420 (H) 150 - 400 K/uL    Comment: RESULT REPEATED AND VERIFIED DELTA CHECK NOTED    Neutrophils Relative % 80 (H) 43 - 77 %   Lymphocytes Relative 11 (L) 12 - 46 %   Monocytes Relative 9 3 - 12 %   Eosinophils Relative 0 0 - 5 %   Basophils Relative 0 0 - 1 %   Neutro Abs 11.1 (H) 1.7 - 7.7 K/uL   Lymphs Abs 1.5 0.7 - 4.0 K/uL   Monocytes Absolute 1.3 (H) 0.1 - 1.0 K/uL   Eosinophils Absolute 0.0 0.0 - 0.7 K/uL   Basophils Absolute 0.0 0.0 - 0.1 K/uL   Smear Review  MORPHOLOGY UNREMARKABLE   CBC with Differential     Status: Abnormal   Collection Time: 11/07/14  5:28 AM  Result Value Ref Range   WBC 12.0 (H) 4.0 - 10.5 K/uL   RBC 5.15 (H) 3.87 - 5.11 MIL/uL   Hemoglobin 12.2 12.0 - 15.0 g/dL   HCT 40.8 36.0 - 46.0 %   MCV 79.2 78.0 - 100.0 fL   MCH 23.7 (L) 26.0 - 34.0 pg   MCHC 29.9 (L) 30.0 - 36.0 g/dL   RDW 16.4 (H) 11.5 - 15.5 %   Platelets 425 (H) 150 - 400 K/uL   Neutrophils Relative % 73 43 - 77 %   Neutro Abs 8.7 (H) 1.7 - 7.7 K/uL   Lymphocytes Relative 19 12 - 46 %   Lymphs Abs 2.3 0.7 - 4.0 K/uL   Monocytes Relative 8 3 - 12 %   Monocytes Absolute 1.0 0.1 - 1.0 K/uL   Eosinophils Relative 0 0 - 5 %   Eosinophils Absolute 0.1 0.0 - 0.7 K/uL   Basophils Relative 0 0 - 1 %   Basophils Absolute 0.0 0.0 - 0.1 K/uL    Radiology/Results: Dg Ugi W/water Sol Cm  11/06/2014   CLINICAL DATA:  44 year old female status post laparoscopic sleeve gastrectomy.  EXAM: UPPER GI SERIES WITH KUB  TECHNIQUE: After obtaining a scout radiograph a routine upper GI series was performed using 50 mL of Omnipaque  FLUOROSCOPY TIME:   If the device does not provide the exposure index:  Fluoroscopy Time (in minutes and seconds):  2 minutes and 50 seconds  Number of Acquired Images:  25  COMPARISON:  08/31/2014  FINDINGS: Initial KUB demonstrated 2 suture lines in the epigastric region and left upper quadrant, related to recent sleeve gastrectomy. Water-soluble contrast was administered orally, which rapidly extended into the stomach. Upon entering the stomach, the contrast pooled proximally, in a somewhat amorphous distribution after in initially bifurcating adjacent to the suture line. Contrast remained in the proximal aspect of the stomach throughout the procedure, particularly along the lesser curvature where there was an amorphous pool of contrast throughout the procedure. No contrast traversed through the area of the sleeve gastrectomy, and no contrast entered the proximal small bowel.  IMPRESSION: 1. Abnormal postoperative appearance following sleeve gastrectomy, as discussed above. At this time, this is favored to be related to postoperative edema adjacent to the suture line, with pooling of contrast in the proximal gastric remnant. Clinical correlation and repeat study is suggested in the next 24-48 hours to re-evaluate this finding. These results were called by telephone at the time of interpretation on 11/06/2014 at 10:47 am to Dr. Johnathan Hausen, who verbally acknowledged these results.   Electronically Signed   By: Vinnie Langton M.D.   On: 11/06/2014 10:50    Anti-infectives: Anti-infectives    Start     Dose/Rate Route Frequency Ordered Stop   11/05/14 0538  cefOXitin (MEFOXIN) 2 g in dextrose 5 % 50 mL IVPB     2 g 100 mL/hr over 30 Minutes Intravenous On call to O.R. 11/05/14 0538 11/05/14 0710      Assessment/Plan: Problem List: Patient Active Problem List   Diagnosis Date Noted  . S/P laparoscopic sleeve gastrectomy March 2016 11/05/2014    Swallow noted above and reviewed by Dr. Lucia Gaskins and myself.  Upper  endoscopy showed intact tubular sleeve immediately postop so we felt that edema may be obstructing outflow.  She had hiatal hernia repair with sleeve.  Will  see how she tolerates clears this am.   2 Days Post-Op    LOS: 2 days   Matt B. Hassell Done, MD, [facility] [phone] beeper [phone]  11/07/2014 8:25 AM

## 2014-11-07 NOTE — Progress Notes (Signed)
Patient alert and oriented, Post op day 2.  Provided support and encouragement.  Encouraged pulmonary toilet, ambulation and small sips of liquids.  All questions answered.  Will continue to monitor. 

## 2014-11-08 LAB — CBC WITH DIFFERENTIAL/PLATELET
Basophils Absolute: 0 10*3/uL (ref 0.0–0.1)
Basophils Relative: 0 % (ref 0–1)
Eosinophils Absolute: 0.2 10*3/uL (ref 0.0–0.7)
Eosinophils Relative: 2 % (ref 0–5)
HCT: 39.2 % (ref 36.0–46.0)
Hemoglobin: 11.9 g/dL — ABNORMAL LOW (ref 12.0–15.0)
Lymphocytes Relative: 34 % (ref 12–46)
Lymphs Abs: 3.7 10*3/uL (ref 0.7–4.0)
MCH: 24 pg — ABNORMAL LOW (ref 26.0–34.0)
MCHC: 30.4 g/dL (ref 30.0–36.0)
MCV: 79 fL (ref 78.0–100.0)
Monocytes Absolute: 1.1 10*3/uL — ABNORMAL HIGH (ref 0.1–1.0)
Monocytes Relative: 10 % (ref 3–12)
Neutro Abs: 6 10*3/uL (ref 1.7–7.7)
Neutrophils Relative %: 54 % (ref 43–77)
Platelets: 353 10*3/uL (ref 150–400)
RBC: 4.96 MIL/uL (ref 3.87–5.11)
RDW: 16.2 % — ABNORMAL HIGH (ref 11.5–15.5)
WBC: 11 10*3/uL — ABNORMAL HIGH (ref 4.0–10.5)

## 2014-11-08 LAB — GLUCOSE, CAPILLARY: Glucose-Capillary: 103 mg/dL — ABNORMAL HIGH (ref 70–99)

## 2014-11-08 MED ORDER — ONDANSETRON 4 MG PO TBDP: 4.0000 mg | ORAL_TABLET | Freq: Three times a day (TID) | ORAL | Status: DC | PRN

## 2014-11-08 NOTE — Discharge Instructions (Signed)

## 2014-11-08 NOTE — Progress Notes (Signed)
Patient alert and oriented, pain is controlled. Patient is tolerating fluids,  advanced to protein shake today, patient tolerated well.  Reviewed Gastric sleeve discharge instructions with patient and patient is able to articulate understanding.  Provided information on BELT program, Support Group and [facility] outpatient pharmacy. All questions answered, will continue to monitor.  

## 2014-11-08 NOTE — Progress Notes (Signed)
Discharge instructions given to pt with all questions answered.

## 2014-11-08 NOTE — Discharge Summary (Signed)
Physician Discharge Summary  Patient ID: Shannon Soto MRN: [id number] DOB/AGE: 1970-11-12 44 y.o.  Admit date: 11/05/2014 Discharge date: 11/08/2014  Admission Diagnoses:  Morbid obesity  Discharge Diagnoses:  same  Active Problems:   S/P laparoscopic sleeve gastrectomy March 2016   Surgery:  Lap sleeve gastrectomy and repair of hiatus hernia  Discharged Condition: improved  Hospital Course:   Had surgery.  UGI on PD 1 showed posterior pouch and apparent edema precluding gastric tube drainage.  By postop day 2 she was tolerating clear liquids and is being advanced and readied for discharge on PD 3.   Consults: none  Significant Diagnostic Studies: UGI    Discharge Exam: Blood pressure 116/69, pulse 75, temperature 98.4 F (36.9 C), temperature source Oral, resp. rate 18, height 5\' 3"  (1.6 m), weight 96.4 kg (212 lb 8.4 oz), SpO2 98 %. Incisions OK.  Nausea and vomiting resolved.   Disposition: 01-Home or Self Care  Discharge Instructions    Ambulate hourly while awake    Complete by:  As directed      Call MD for:  difficulty breathing, headache or visual disturbances    Complete by:  As directed      Call MD for:  persistant dizziness or light-headedness    Complete by:  As directed      Call MD for:  persistant nausea and vomiting    Complete by:  As directed      Call MD for:  redness, tenderness, or signs of infection (pain, swelling, redness, odor or green/yellow discharge around incision site)    Complete by:  As directed      Call MD for:  severe uncontrolled pain    Complete by:  As directed      Call MD for:  temperature >101 F    Complete by:  As directed      Diet bariatric full liquid    Complete by:  As directed      Incentive spirometry    Complete by:  As directed   Perform hourly while awake            Medication List    STOP taking these medications        ibuprofen 800 MG tablet  Commonly known as:  ADVIL,MOTRIN      TAKE these  medications        amLODipine 10 MG tablet  Commonly known as:  NORVASC  Take 10 mg by mouth at bedtime.     Calcium-Vitamin D3 500-400 MG-UNIT Tabs  Take 1 tablet by mouth 3 (three) times daily.     cyanocobalamin 500 MCG tablet  Take 1 mcg by mouth every morning.     FLUoxetine 10 MG capsule  Commonly known as:  PROZAC  Take 10 mg by mouth at bedtime.     gabapentin 600 MG tablet  Commonly known as:  NEURONTIN  Take 600 mg by mouth 3 (three) times daily.     hydrocortisone cream 1 %  Apply 1 application topically 2 (two) times daily as needed for itching (eczema.).     losartan 25 MG tablet  Commonly known as:  COZAAR  Take 25 mg by mouth at bedtime.     metFORMIN 500 MG (MOD) 24 hr tablet  Commonly known as:  GLUMETZA  Take 500 mg by mouth at bedtime.     multivitamin with minerals Tabs tablet  Take 1 tablet by mouth 2 (two) times daily.  ondansetron 4 MG disintegrating tablet  Commonly known as:  ZOFRAN ODT  Take 1 tablet (4 mg total) by mouth every 8 (eight) hours as needed for nausea or vomiting.     pravastatin 40 MG tablet  Commonly known as:  PRAVACHOL  Take 40 mg by mouth at bedtime.     ranitidine 300 MG capsule  Commonly known as:  ZANTAC  Take 300 mg by mouth at bedtime.     zolpidem 10 MG tablet  Commonly known as:  AMBIEN  Take 10 mg by mouth at bedtime as needed for sleep.           Follow-up Information    Follow up with Pedro Earls, MD.   Specialty:  General Surgery   Contact information:   [place] [address] [phone]       Signed: Pedro Earls 11/08/2014, 8:32 AM

## 2014-11-09 ENCOUNTER — Telehealth (HOSPITAL_COMMUNITY): Payer: Self-pay

## 2014-11-09 NOTE — Telephone Encounter (Addendum)
Attempted DROP discharge call, no answer, left message to return call  Made discharge phone call to patient per DROP protocol. Asking the following questions.    1. Do you have someone to care for you now that you are home?  yes 2. Are you having pain now that is not relieved by your pain medication?  no 3. Are you able to drink the recommended daily amount of fluids (48 ounces minimum/day) and protein (60-80 grams/day) as prescribed by the dietitian or nutritional counselor?  Yes, working up to it 4. Are you taking the vitamins and minerals as prescribed?  yes 5. Do you have the "on call" number to contact your surgeon if you have a problem or question?  yes 6. Are your incisions free of redness, swelling or drainage? (If steri strips, address that these can fall off, shower as tolerated) yes 7. Have your bowels moved since your surgery?  If not, are you passing gas?  No, yes 8. Are you up and walking 3-4 times per day?  yes    1. Do you have an appointment made to see your surgeon in the next month?  yes 2. Were you provided your discharge medications before your surgery or before you were discharged from the hospital and are you taking them without problem?  yes 3. Were you provided phone numbers to the clinic/surgeon's office?  yes 4. Did you watch the patient education video module in the (clinic, surgeon's office, etc.) before your surgery? yes 5. Do you have a discharge checklist that was provided to you in the hospital to reference with instructions on how to take care of yourself after surgery?  yes 6. Did you see a dietitian or nutritional counselor while you were in the hospital?  yes 7. Do you have an appointment to see a dietitian or nutritional counselor in the next month?  yes

## 2014-11-12 ENCOUNTER — Telehealth (HOSPITAL_COMMUNITY): Payer: Self-pay

## 2014-11-12 NOTE — Telephone Encounter (Signed)
Patient returned Laurie's message from Friday & I reviewed the following questions with her.   Made discharge phone call to patient per DROP protocol. Asking the following questions.    1. Do you have someone to care for you now that you are home?  YES 2. Are you having pain now that is not relieved by your pain medication?  NO 3. Are you able to drink the recommended daily amount of fluids (48 ounces minimum/day) and protein (60-80 grams/day) as prescribed by the dietitian or nutritional counselor?  Trying but not yet. Reports about 1 full Atkins shake daily around 30 gms protein & should be getting some premier shakes soon  4. Are you taking the vitamins and minerals as prescribed?  yes 5. Do you have the on call number to contact your surgeon if you have a problem or question?  yes 6. Are your incisions free of redness, swelling or drainage? (If steri strips, address that these can fall off, shower as tolerated) yes 7. Have your bowels moved since your surgery?  If not, are you passing gas?  yes 8. Are you up and walking 3-4 times per day?  yes  1. Do you have an appointment made to see your surgeon in the next month?  yes 2. Were you provided your discharge medications before your surgery or before you were discharged from the hospital and are you taking them without problem?  Yes 3. Were you provided phone numbers to the clinic/surgeons office?  yes 4. Did you watch the patient education video module in the (clinic, surgeons office, etc.) before your surgery? yes 5. Do you have a discharge checklist that was provided to you in the hospital to reference with instructions on how to take care of yourself after surgery?  yes 6. Did you see a dietitian or nutritional counselor while you were in the hospital?  yes 7. Do you have an appointment to see a dietitian or nutritional counselor in the next month?  yes

## 2014-11-20 ENCOUNTER — Encounter: Payer: 59 | Attending: Surgery

## 2014-11-20 DIAGNOSIS — Z713 Dietary counseling and surveillance: Secondary | ICD-10-CM | POA: Insufficient documentation

## 2014-11-20 DIAGNOSIS — Z6839 Body mass index (BMI) 39.0-39.9, adult: Secondary | ICD-10-CM | POA: Insufficient documentation

## 2014-11-20 DIAGNOSIS — E669 Obesity, unspecified: Secondary | ICD-10-CM | POA: Diagnosis present

## 2014-11-20 NOTE — Progress Notes (Signed)
Bariatric Class:  Appt start time: 1530 end time:  1630.  2 Week Post-Operative Nutrition Class  Patient was seen on 11/20/14 for Post-Operative Nutrition education at [facility].   Surgery date: 11/05/2014 Surgery type: Gastric sleeve Start weight at [facility]: 221 lbs on 08/30/14 Weight today: 199.5 lbs  Weight change: 22.5 lbs  TANITA  BODY COMP RESULTS  10/15/14 11/20/14   BMI (kg/m^2) 39.3 35.3   Fat Mass (lbs) 109 98.5   Fat Free Mass (lbs) 113 101.0   Total Body Water (lbs) 82.5 74.0    The following the learning objectives were met by the patient during this course:  Identifies Phase 3A (Soft, High Proteins) Dietary Goals and will begin from 2 weeks post-operatively to 2 months post-operatively  Identifies appropriate sources of fluids and proteins   States protein recommendations and appropriate sources post-operatively  Identifies the need for appropriate texture modifications, mastication, and bite sizes when consuming solids  Identifies appropriate multivitamin and calcium sources post-operatively  Describes the need for physical activity post-operatively and will follow MD recommendations  States when to call healthcare provider regarding medication questions or post-operative complications  Handouts given during class include:  Phase 3A: Soft, High Protein Diet Handout  Follow-Up Plan: [facility] for 2 month post-op nutrition visit for diet advancement per MD.

## 2014-12-13 ENCOUNTER — Other Ambulatory Visit (INDEPENDENT_AMBULATORY_CARE_PROVIDER_SITE_OTHER): Payer: Self-pay

## 2014-12-13 DIAGNOSIS — Z9884 Bariatric surgery status: Secondary | ICD-10-CM

## 2014-12-13 DIAGNOSIS — R112 Nausea with vomiting, unspecified: Secondary | ICD-10-CM

## 2015-01-03 ENCOUNTER — Encounter: Payer: 59 | Attending: Surgery | Admitting: Dietician

## 2015-01-03 DIAGNOSIS — E669 Obesity, unspecified: Secondary | ICD-10-CM | POA: Insufficient documentation

## 2015-01-03 DIAGNOSIS — Z6839 Body mass index (BMI) 39.0-39.9, adult: Secondary | ICD-10-CM | POA: Insufficient documentation

## 2015-01-03 DIAGNOSIS — Z713 Dietary counseling and surveillance: Secondary | ICD-10-CM | POA: Insufficient documentation

## 2015-01-03 NOTE — Patient Instructions (Addendum)
Goals:  Follow Phase 3B: High Protein + Non-Starchy Vegetables  Eat 3-6 small meals/snacks, every 3-5 hrs  Increase lean protein foods to meet 60g goal  Have a protein food or shake for afternoon snack  Increase fluid intake to 64oz +  Avoid drinking 15 minutes before, during and 30 minutes after eating  Aim for >30 min of physical activity daily  Limit carbs (fruit) to about 15 grams or 1/2 cup per meal   TANITA  BODY COMP RESULTS  10/15/14 11/20/14 01/03/15   BMI (kg/m^2) 39.3 35.3 33.8   Fat Mass (lbs) 109 98.5 86   Fat Free Mass (lbs) 113 101.0 105   Total Body Water (lbs) 82.5 74.0 77

## 2015-01-03 NOTE — Progress Notes (Signed)
  Follow-up visit:  8 Weeks Post-Operative Gastric sleeve Surgery  Medical Nutrition Therapy:  Appt start time: 1205 end time:  1235  Primary concerns today: Post-operative Bariatric Surgery Nutrition Management. Tesha returns having lost another 8.5 pounds. She reports she can tolerate 1.5 oz of meat at a time. Eating protein 1st. Having some carbs like bites of toast in the morning. Working on developing her routine now that she is back at work.  Surgery date: 11/05/2014 Surgery type: Gastric sleeve Start weight at [facility]: 221 lbs on 08/30/14 Weight today: 191 lbs Weight change: 8.5 lbs Total weight lost: 30 lbs  Weight loss goal: 160 lbs   TANITA  BODY COMP RESULTS  10/15/14 11/20/14 01/03/15   BMI (kg/m^2) 39.3 35.3 33.8   Fat Mass (lbs) 109 98.5 86   Fat Free Mass (lbs) 113 101.0 105   Total Body Water (lbs) 82.5 74.0 77    Preferred Learning Style:   No preference indicated   Learning Readiness:   Ready  24-hr recall: B (AM): 3/4 scrambled egg and 2 bites of toast (6g) Snk (AM): 1/4 Oikos yogurt (3-4g)   L (PM): 1.5 oz chicken or ribs or grilled shrimp with green beans or zucchini and onions (10g) Snk (PM): SF jello or popsicle  D (PM): see lunch (10g) Snk (PM): 1/2 Atkins shake (7g)  Fluid intake: ICE water, Atkins protein shake (11 oz)  Estimated total protein intake: 37g  Medications: no longer taking one of her BP meds and metformin Supplementation: taking, sometimes forgets one  Using straws: no Drinking while eating: no Hair loss: no Carbonated beverages: no N/V/D/C: constipation  Recent physical activity:  Walking and core exercises  Progress Towards Goal(s):  In progress.  Handouts given during visit include:  Phase 3B lean protein + non starchy vegetables   Nutritional Diagnosis:  -3.3 Overweight/obesity related to past poor dietary habits and physical inactivity as evidenced by patient w/ recent gastric sleeve surgery following dietary guidelines  for continued weight loss.     Intervention:  Nutrition counseling provided.  Teaching Method Utilized:  Visual Auditory Hands on  Barriers to learning/adherence to lifestyle change: none  Demonstrated degree of understanding via:  Teach Back   Monitoring/Evaluation:  Dietary intake, exercise, and body weight. Follow up in 1 months for 3 month post-op visit.

## 2015-02-14 ENCOUNTER — Ambulatory Visit: Payer: 59 | Admitting: Dietician

## 2015-02-18 ENCOUNTER — Encounter: Payer: Self-pay | Admitting: Family Medicine

## 2015-02-18 ENCOUNTER — Other Ambulatory Visit: Payer: Self-pay

## 2015-02-18 DIAGNOSIS — F5104 Psychophysiologic insomnia: Secondary | ICD-10-CM

## 2015-02-18 MED ORDER — ZOLPIDEM TARTRATE 10 MG PO TABS: 10.0000 mg | ORAL_TABLET | Freq: Every evening | ORAL | Status: DC | PRN

## 2015-02-28 ENCOUNTER — Ambulatory Visit (INDEPENDENT_AMBULATORY_CARE_PROVIDER_SITE_OTHER): Payer: 59 | Admitting: Family Medicine

## 2015-02-28 ENCOUNTER — Encounter: Payer: Self-pay | Admitting: Family Medicine

## 2015-02-28 VITALS — BP 136/74 | HR 105 | Temp 98.9°F | Resp 18 | Ht 63.0 in | Wt 181.6 lb

## 2015-02-28 DIAGNOSIS — E669 Obesity, unspecified: Secondary | ICD-10-CM | POA: Diagnosis not present

## 2015-02-28 DIAGNOSIS — G43019 Migraine without aura, intractable, without status migrainosus: Secondary | ICD-10-CM

## 2015-02-28 DIAGNOSIS — G4700 Insomnia, unspecified: Secondary | ICD-10-CM

## 2015-02-28 DIAGNOSIS — G990 Autonomic neuropathy in diseases classified elsewhere: Secondary | ICD-10-CM | POA: Diagnosis not present

## 2015-02-28 DIAGNOSIS — K5900 Constipation, unspecified: Secondary | ICD-10-CM

## 2015-02-28 DIAGNOSIS — E1142 Type 2 diabetes mellitus with diabetic polyneuropathy: Secondary | ICD-10-CM | POA: Diagnosis not present

## 2015-02-28 DIAGNOSIS — I10 Essential (primary) hypertension: Secondary | ICD-10-CM | POA: Insufficient documentation

## 2015-02-28 DIAGNOSIS — Z8639 Personal history of other endocrine, nutritional and metabolic disease: Secondary | ICD-10-CM | POA: Insufficient documentation

## 2015-02-28 DIAGNOSIS — R2240 Localized swelling, mass and lump, unspecified lower limb: Secondary | ICD-10-CM | POA: Insufficient documentation

## 2015-02-28 DIAGNOSIS — K5909 Other constipation: Secondary | ICD-10-CM

## 2015-02-28 DIAGNOSIS — Z9884 Bariatric surgery status: Secondary | ICD-10-CM | POA: Diagnosis not present

## 2015-02-28 DIAGNOSIS — G43009 Migraine without aura, not intractable, without status migrainosus: Secondary | ICD-10-CM | POA: Insufficient documentation

## 2015-02-28 DIAGNOSIS — F5104 Psychophysiologic insomnia: Secondary | ICD-10-CM | POA: Insufficient documentation

## 2015-02-28 DIAGNOSIS — E559 Vitamin D deficiency, unspecified: Secondary | ICD-10-CM | POA: Diagnosis not present

## 2015-02-28 DIAGNOSIS — E1143 Type 2 diabetes mellitus with diabetic autonomic (poly)neuropathy: Secondary | ICD-10-CM

## 2015-02-28 DIAGNOSIS — K219 Gastro-esophageal reflux disease without esophagitis: Secondary | ICD-10-CM | POA: Insufficient documentation

## 2015-02-28 DIAGNOSIS — F418 Other specified anxiety disorders: Secondary | ICD-10-CM | POA: Diagnosis not present

## 2015-02-28 DIAGNOSIS — G629 Polyneuropathy, unspecified: Secondary | ICD-10-CM | POA: Diagnosis not present

## 2015-02-28 DIAGNOSIS — E785 Hyperlipidemia, unspecified: Secondary | ICD-10-CM | POA: Insufficient documentation

## 2015-02-28 DIAGNOSIS — L80 Vitiligo: Secondary | ICD-10-CM | POA: Insufficient documentation

## 2015-02-28 MED ORDER — POLYETHYLENE GLYCOL 3350 17 GM/SCOOP PO POWD: 17.0000 g | Freq: Two times a day (BID) | ORAL | Status: DC | PRN

## 2015-02-28 MED ORDER — FERROUS GLUCONATE 240 (27 FE) MG PO TABS: 240.0000 mg | ORAL_TABLET | Freq: Three times a day (TID) | ORAL | Status: AC

## 2015-02-28 MED ORDER — ZOLPIDEM TARTRATE 10 MG PO TABS: 10.0000 mg | ORAL_TABLET | Freq: Every evening | ORAL | Status: DC | PRN

## 2015-02-28 MED ORDER — FLUOXETINE HCL 10 MG PO CAPS: 10.0000 mg | ORAL_CAPSULE | Freq: Two times a day (BID) | ORAL | Status: DC

## 2015-02-28 NOTE — Progress Notes (Signed)
Name: Shannon Soto   MRN: [id number]    DOB: 05-03-1971   Date:02/28/2015       Progress Note  Subjective  Chief Complaint  Chief Complaint  Patient presents with  . Diabetes  . Hypertension    bariatric surgery bp running low so stopped all meds  . Insomnia    avg sleep 5hrs    HPI  Bariatric Surgery: she had sleeve surgery done by Dr. Hassell Done on March 14th, 2016, she has lost over 40 lbs since surgery. She is doing well, only problem is that about one month ago she lifted some heavy groceries bags and developed numbness and pain on left lower quadrant, intermittently. No fever, she has spoken to Dr. Hassell Done and was given reassurance.  Hyperlipidemia: she is off Pravastatin since surgery, recheck labs  HTN: off medication since bariatric surgery.  Insomnia: taking medication and is tolerating it well, denies side effects, only sleeps about 5 hours on medication, she denies naps during the day. She sleeps about 8 hours on weekends, because she does not have to get up early .  She goes to bed late on the weekdays, between 11:30 and midnight.  Peripheral neuropathy: she is still taking gabapentin twice daily , pain on her legs are still present, aching and throbbing, but better with medication  DMII with peripheral neuropathy: off metformin since surgery, no polyphagia, polydipsia or polyuria.   Depression/Anxiety: taking Fluoxetine, sometimes she takes twice daily because of stress at work, but explained that it does not work that way. Explained that we can increase to 20mg  daily   Migraines: she has tried multiple medications, but could not tolerate, she take Ibuprofen prn, episodes about 2 times weekly. Pain is behind her eyes, throbbing like, associated with nausea at times, photophobia. It can last up to 4 hours with medication    Patient Active Problem List   Diagnosis Date Noted  . Migraine headache without aura 02/28/2015  . Obesity (BMI 30-39.9) 02/28/2015  .  Hyperlipidemia 02/28/2015  . GERD (gastroesophageal reflux disease) 02/28/2015  . Vitamin D deficiency 02/28/2015  . Chronic constipation 02/28/2015  . Chronic insomnia 02/28/2015  . Peripheral neuropathy 02/28/2015  . Vitiligo 02/28/2015  . Lower leg mass 02/28/2015  . Depression with anxiety 02/28/2015  . History of bariatric surgery 11/05/2014    Past Surgical History  Procedure Laterality Date  . Breath tek h pylori N/A 08/27/2014    Procedure: BREATH TEK H PYLORI;  Surgeon: Pedro Earls, MD;  Location: [facility];  Service: General;  Laterality: N/A;  . Appendectomy    . Breast surgery    . Abdominal hysterectomy    . Laparoscopic gastric sleeve resection N/A 11/05/2014    Procedure: LAPAROSCOPIC GASTRIC SLEEVE RESECTION;  Surgeon: Johnathan Hausen, MD;  Location: [facility];  Service: General;  Laterality: N/A;  . Hiatal hernia repair  11/05/2014    Procedure: LAPAROSCOPIC REPAIR OF HIATAL HERNIA;  Surgeon: Johnathan Hausen, MD;  Location: [facility];  Service: General;;  . Upper gi endoscopy  11/05/2014    Procedure: UPPER GI ENDOSCOPY;  Surgeon: Johnathan Hausen, MD;  Location: [facility];  Service: General;;    Family History  Problem Relation Age of Onset  . Diabetes Paternal Uncle   . Stroke Father   . Hypertension Other     History   Social History  . Marital Status: Single    Spouse Name: N/A  . Number of Children: N/A  . Years of Education: N/A  Occupational History  . Not on file.   Social History Main Topics  . Smoking status: Never Smoker   . Smokeless tobacco: Never Used  . Alcohol Use: No     Comment: occasional   . Drug Use: No  . Sexual Activity: Yes   Other Topics Concern  . Not on file   Social History Narrative     Current outpatient prescriptions:  .  Calcium Carb-Cholecalciferol (CALCIUM-VITAMIN D3) 500-400 MG-UNIT TABS, Take 1 tablet by mouth 3 (three) times daily., Disp: , Rfl:  .  cyanocobalamin 500 MCG tablet, Take 1 mcg by mouth every  morning., Disp: , Rfl:  .  FLUoxetine (PROZAC) 10 MG capsule, Take 10 mg by mouth at bedtime. , Disp: , Rfl:  .  gabapentin (NEURONTIN) 600 MG tablet, Take 600 mg by mouth 3 (three) times daily., Disp: , Rfl:  .  hydrocortisone cream 1 %, Apply 1 application topically 2 (two) times daily as needed for itching (eczema.)., Disp: , Rfl:  .  Multiple Vitamin (MULTIVITAMIN WITH MINERALS) TABS tablet, Take 1 tablet by mouth 2 (two) times daily., Disp: , Rfl:  .  ondansetron (ZOFRAN ODT) 4 MG disintegrating tablet, Take 1 tablet (4 mg total) by mouth every 8 (eight) hours as needed for nausea or vomiting., Disp: 20 tablet, Rfl: 0 .  zolpidem (AMBIEN) 10 MG tablet, Take 1 tablet (10 mg total) by mouth at bedtime as needed for sleep (Take 1 and 1/2 tablet at bedtime.)., Disp: 45 tablet, Rfl: 0 .  ferrous gluconate ([facility] IRON) 240 (27 FE) MG tablet, Take 1 tablet (240 mg total) by mouth 3 (three) times daily with meals., Disp: 30 tablet, Rfl: 0  No Known Allergies   ROS  Constitutional: Negative for fever , but has  weight change - loss  Respiratory: Negative for cough and shortness of breath.   Cardiovascular: Negative for chest pain or palpitations.  Gastrointestinal: Negative for abdominal pain, no bowel changes.  Musculoskeletal: Negative for gait problem or joint swelling.  Skin: positive  for rash.  Neurological: Negative for dizziness, positive for headache.  No other specific complaints in a complete review of systems (except as listed in HPI above).  Objective  Filed Vitals:   02/28/15 1338  BP: 136/74  Pulse: 105  Temp: 98.9 F (37.2 C)  TempSrc: Oral  Resp: 18  Height: 5\' 3"  (1.6 m)  Weight: 181 lb 9.6 oz (82.373 kg)  SpO2: 98%    Body mass index is 32.18 kg/(m^2).  Physical Exam  Constitutional: Patient appears well-developed and well-nourished. No distress.  Eyes:  No scleral icterus. PERL Neck: Normal range of motion. Neck supple. Cardiovascular: Normal rate, regular  rhythm and normal heart sounds.  No murmur heard. No BLE edema. Pulmonary/Chest: Effort normal and breath sounds normal. No respiratory distress. Abdominal: Soft.  There is no tenderness. Psychiatric: Patient has a normal mood and affect. behavior is normal. Judgment and thought content normal.  Diabetic Foot Exam - Simple   Simple Foot Form  Visual Inspection  No deformities, no ulcerations, no other skin breakdown bilaterally:  Yes  Sensation Testing  Intact to touch and monofilament testing bilaterally:  Yes  Pulse Check  Posterior Tibialis and Dorsalis pulse intact bilaterally:  Yes  Comments        PHQ2/9: Depression screen PHQ 2/9 02/28/2015  Decreased Interest 0  Down, Depressed, Hopeless 0  PHQ - 2 Score 0    Fall Risk: Fall Risk  02/28/2015  Falls in  the past year? No     Assessment & Plan   1. Intractable migraine without aura and without status migrainosus Continue prn medication  2. Type 2 diabetes mellitus with peripheral autonomic neuropathy Lost weight, off medication, doing well, still has neuropathy symptoms - POCT HgB A1C  3. Obesity (BMI 30-39.9) Doing great, lost 40lbs since last visit  4. Chronic constipation Taking otc medication, but we will try Miralax - polyethylene glycol powder (GLYCOLAX/MIRALAX) powder; Take 17 g by mouth 2 (two) times daily as needed.  Dispense: 3350 g; Refill: 1  5. Vitamin D deficiency  - Vitamin D (25 hydroxy)  6. History of bariatric surgery Check labs - ferrous gluconate ([facility]S IRON) 240 (27 FE) MG tablet; Take 1 tablet (240 mg total) by mouth 3 (three) times daily with meals.  Dispense: 30 tablet; Refill: 0 - Iron - CBC - B12 - Comprehensive Metabolic Panel (CMET) - Vitamin B1  7. Peripheral neuropathy Stable on medication  8. Depression with anxiety Increase dose to twice daily , since she can't take 20mg  at once makes her feel jittery, but is getting anxious at work. - FLUoxetine (PROZAC) 10 MG capsule;  Take 1 capsule (10 mg total) by mouth 2 (two) times daily.  Dispense: 180 capsule; Refill: 0  9. Hyperlipidemia  - Lipid Profile  10. Chronic insomnia  - zolpidem (AMBIEN) 10 MG tablet; Take 1 tablet (10 mg total) by mouth at bedtime as needed for sleep (Take 1 and 1/2 tablet at bedtime.).  Dispense: 135 tablet; Refill: 0

## 2015-03-21 ENCOUNTER — Encounter: Payer: 59 | Attending: Surgery | Admitting: Dietician

## 2015-03-21 ENCOUNTER — Encounter: Payer: Self-pay | Admitting: Dietician

## 2015-03-21 VITALS — Ht 63.0 in | Wt 179.0 lb

## 2015-03-21 DIAGNOSIS — Z6839 Body mass index (BMI) 39.0-39.9, adult: Secondary | ICD-10-CM | POA: Insufficient documentation

## 2015-03-21 DIAGNOSIS — E669 Obesity, unspecified: Secondary | ICD-10-CM | POA: Diagnosis present

## 2015-03-21 DIAGNOSIS — Z713 Dietary counseling and surveillance: Secondary | ICD-10-CM | POA: Insufficient documentation

## 2015-03-21 NOTE — Progress Notes (Signed)
  Follow-up visit:  4.5 months Post-Operative Gastric sleeve Surgery  Medical Nutrition Therapy:  Appt start time: 150 end time:  225  Primary concerns today: Post-operative Bariatric Surgery Nutrition Management. Lachlan returns having lost another 12 pounds. Her protein intake has improved; she has added protein foods to snacks and is able to tolerate 2-3 ounces of meat at a time. She is off blood pressure, diabetes, and cholesterol medication. Had some left lower quadrant pain and numbness with associated nausea; patient reports this is beginning to resolve. She has been having yeast infections under folds of skin and considering having skin removed once she has met her weight loss goal.    Samples provided and patient instructed on proper [place]: Celebrate multivitamin chews (berry - qty 1) Lot#: [id number] Exp: 10/2016  Surgery date: 11/05/2014 Surgery type: Gastric sleeve Start weight at [facility]: 222 lbs on 08/30/14 Weight today: 179 lbs Weight change: 12 lbs Total weight lost: 43 lbs  Weight loss goal: 160 lbs   TANITA  BODY COMP RESULTS  10/15/14 11/20/14 01/03/15 03/21/15   BMI (kg/m^2) 39.3 35.3 33.8 31.7   Fat Mass (lbs) 109 98.5 86 76.5   Fat Free Mass (lbs) 113 101.0 105 102.5   Total Body Water (lbs) 82.5 74.0 77 75    Preferred Learning Style:   No preference indicated   Learning Readiness:   Ready  24-hr recall: B (AM): Premier protein shake (30g)  Snk (AM): toast and boiled egg (7g) Snk (AM): yogurt or apples or grapes or strawberries or SF jello (0-12g) L (PM): 2.5-3 oz chicken or grilled shrimp with green beans or zucchini and onions (18-21g) Snk (PM): SF popsicle or deli meat (0-7g)  D (PM): see lunch (18-21g) Snk (PM): Atkins shake (15g)  Fluid intake: 40 oz water, 17 oz ICE water, Premier protein shake (11 oz); total 68 oz  Estimated total protein intake: 60g most days  Medications: no longer taking one of her BP meds and metformin Supplementation: taking,  taking gummy vitamins  Using straws: no Drinking while eating: no Hair loss: yes, taking Biotin Carbonated beverages: no N/V/D/C: constipation, taking Miralax; nausea when she gets too full  Recent physical activity:  None recently due to abdominal pain/numbness  Progress Towards Goal(s):  In progress.  Handouts given during visit include:     Nutritional Diagnosis:  [place]-3.3 Overweight/obesity related to past poor dietary habits and physical inactivity as evidenced by patient w/ recent gastric sleeve surgery following dietary guidelines for continued weight loss.     Intervention:  Nutrition counseling provided.  Teaching Method Utilized:  Visual Auditory Hands on  Barriers to learning/adherence to lifestyle change: none  Demonstrated degree of understanding via:  Teach Back   Monitoring/Evaluation:  Dietary intake, exercise, and body weight. Follow up in 1 months for 3 month post-op visit.

## 2015-03-21 NOTE — Patient Instructions (Addendum)
Goals:  Follow Phase 3B: High Protein + Non-Starchy Vegetables  Eat 3-6 small meals/snacks, every 3-5 hrs  Increase lean protein foods to meet 60g goal  Increase fluid intake to 64oz +  Avoid drinking 15 minutes before, during and 30 minutes after eating  Aim for >30 min of physical activity daily  Limit carbs (fruit) to about 15 grams or 1/2 cup per meal  Try Celebrate multivitamin soft chews  Limit cornflakes to 1/2 cup and avoid fruit and toast that day  Make sure to have a protein snack at least 1x a day   Surgery date: 11/05/2014 Surgery type: Gastric sleeve Start weight at [facility]: 222 lbs on 08/30/14 Weight today: 179 lbs Weight change: 12 lbs Total weight lost: 43 lbs   TANITA  BODY COMP RESULTS  10/15/14 11/20/14 01/03/15 03/21/15   BMI (kg/m^2) 39.3 35.3 33.8 31.7   Fat Mass (lbs) 109 98.5 86 76.5   Fat Free Mass (lbs) 113 101.0 105 102.5   Total Body Water (lbs) 82.5 74.0 77 75

## 2015-05-02 ENCOUNTER — Encounter: Payer: 59 | Attending: Surgery | Admitting: Dietician

## 2015-05-02 ENCOUNTER — Encounter: Payer: Self-pay | Admitting: Dietician

## 2015-05-02 VITALS — Ht 63.0 in | Wt 174.0 lb

## 2015-05-02 DIAGNOSIS — Z713 Dietary counseling and surveillance: Secondary | ICD-10-CM | POA: Diagnosis not present

## 2015-05-02 DIAGNOSIS — Z6839 Body mass index (BMI) 39.0-39.9, adult: Secondary | ICD-10-CM | POA: Insufficient documentation

## 2015-05-02 DIAGNOSIS — E669 Obesity, unspecified: Secondary | ICD-10-CM | POA: Insufficient documentation

## 2015-05-02 NOTE — Progress Notes (Signed)
  Follow-up visit:  6 months Post-Operative Gastric sleeve Surgery  Medical Nutrition Therapy:  Appt start time: 200 end time:  235  Primary concerns today: Post-operative Bariatric Surgery Nutrition Management. Shannon Soto returns having lost another 5 pounds. Went down a size in her clothes. She has been under some family stress; her son has special needs and had a seizure at school last week. Planning to join a gym soon; working out at home is difficult because she gets distracted. She states that she feels bloated frequently. However abdominal pain and numbness are subsiding. Plans to follow up with Dr. Hassell Done and Dr. Ardath Sax this month. Her weight loss is slow but she is satisfied with steady weight loss. Has never had any vomiting post op.  Surgery date: 11/05/2014 Surgery type: Gastric sleeve Start weight at [facility]: 222 lbs on 08/30/14 Weight today: 174 lbs Weight change: 5 lbs Total weight lost: 48 lbs  Weight loss goal: 160 lbs   TANITA  BODY COMP RESULTS  10/15/14 11/20/14 01/03/15 03/21/15 05/02/15   BMI (kg/m^2) 39.3 35.3 33.8 31.7 30.8   Fat Mass (lbs) 109 98.5 86 76.5 72.5   Fat Free Mass (lbs) 113 101.0 105 102.5 101.5   Total Body Water (lbs) 82.5 74.0 77 75 74.5    Preferred Learning Style:   No preference indicated   Learning Readiness:   Ready  24-hr recall: B (AM): scrambled egg with cheese and spinach (10g) Snk (AM): Premier protein shake (30g) L (PM): hamburger patty OR salad with 2 oz shrimp or chicken with 1 Tbsp Ranch (14g) Snk (PM): fruit OR 1/2 cup cereal D (PM): fat free hotdog wrapped in cheese (14g) Snk (PM): 1/2 cup lite popcorn  Fluid intake: 58 oz water, Premier protein shake (11 oz) Estimated total protein intake: 68g  Medications: no longer taking one of her BP meds and metformin Supplementation: taking in pill form  Using straws: no Drinking while eating: no Hair loss: getting better, still taking Biotin Carbonated beverages: no N/V/D/C:  constipation, still taking Miralax; nausea when she gets too full  Recent physical activity:  None recently due to abdominal pain/numbness  Progress Towards Goal(s):  In progress.   Nutritional Diagnosis:  [place]-3.3 Overweight/obesity related to past poor dietary habits and physical inactivity as evidenced by patient w/ recent gastric sleeve surgery following dietary guidelines for continued weight loss.     Intervention:  Nutrition counseling provided.  Teaching Method Utilized:  Visual Auditory Hands on  Barriers to learning/adherence to lifestyle change: none  Demonstrated degree of understanding via:  Teach Back   Monitoring/Evaluation:  Dietary intake, exercise, and body weight. Follow up in 2 months for 8 month post-op visit.

## 2015-05-02 NOTE — Patient Instructions (Addendum)
Goals:  Follow Phase 3B: High Protein + Non-Starchy Vegetables  Eat 3-6 small meals/snacks, every 3-5 hrs  Increase lean protein foods to meet 60g goal  Increase fluid intake to 64oz +  Avoid drinking 15 minutes before, during and 30 minutes after eating  Aim for >30 min of physical activity daily  Limit carbs (fruit) to about 15 grams or 1/2 cup per meal  Limit cornflakes to 1/2 cup and avoid fruit and toast that day  Make sure to have a protein snack at least 1x a day  Join a gym and develop an exercise routine  If you see your weight loss slow too much, avoid popcorn   TANITA  BODY COMP RESULTS  10/15/14 11/20/14 01/03/15 03/21/15 05/02/15   BMI (kg/m^2) 39.3 35.3 33.8 31.7 30.8   Fat Mass (lbs) 109 98.5 86 76.5 72.5   Fat Free Mass (lbs) 113 101.0 105 102.5 101.5   Total Body Water (lbs) 82.5 74.0 77 75 74.5

## 2015-06-05 ENCOUNTER — Other Ambulatory Visit: Payer: Self-pay | Admitting: Family Medicine

## 2015-06-05 DIAGNOSIS — F5104 Psychophysiologic insomnia: Secondary | ICD-10-CM

## 2015-06-06 ENCOUNTER — Other Ambulatory Visit: Payer: Self-pay | Admitting: Family Medicine

## 2015-06-06 DIAGNOSIS — F5104 Psychophysiologic insomnia: Secondary | ICD-10-CM

## 2015-06-06 MED ORDER — ZOLPIDEM TARTRATE 10 MG PO TABS: 10.0000 mg | ORAL_TABLET | Freq: Every evening | ORAL | Status: DC | PRN

## 2015-06-24 ENCOUNTER — Other Ambulatory Visit
Admission: RE | Admit: 2015-06-24 | Discharge: 2015-06-24 | Disposition: A | Discharge status: Home / Self Care | Payer: 59 | Source: Ambulatory Visit | Attending: Family Medicine | Admitting: Family Medicine

## 2015-06-24 DIAGNOSIS — E559 Vitamin D deficiency, unspecified: Secondary | ICD-10-CM | POA: Insufficient documentation

## 2015-06-24 DIAGNOSIS — E785 Hyperlipidemia, unspecified: Secondary | ICD-10-CM | POA: Diagnosis not present

## 2015-06-24 DIAGNOSIS — Z9884 Bariatric surgery status: Secondary | ICD-10-CM | POA: Diagnosis not present

## 2015-06-24 LAB — COMPREHENSIVE METABOLIC PANEL
ALBUMIN: 4.1 g/dL (ref 3.5–5.0)
ALK PHOS: 92 U/L (ref 38–126)
ALT: 12 U/L — ABNORMAL LOW (ref 14–54)
ANION GAP: 7 (ref 5–15)
AST: 15 U/L (ref 15–41)
BILIRUBIN TOTAL: 0.4 mg/dL (ref 0.3–1.2)
BUN: 20 mg/dL (ref 6–20)
CALCIUM: 8.9 mg/dL (ref 8.9–10.3)
CO2: 28 mmol/L (ref 22–32)
Chloride: 106 mmol/L (ref 101–111)
Creatinine, Ser: 0.73 mg/dL (ref 0.44–1.00)
GFR calc Af Amer: >60 mL/min (ref >60)
GLUCOSE: 106 mg/dL — AB (ref 65–99)
POTASSIUM: 3.5 mmol/L (ref 3.5–5.1)
Sodium: 141 mmol/L (ref 135–145)
TOTAL PROTEIN: 7.6 g/dL (ref 6.5–8.1)

## 2015-06-24 LAB — CBC
HEMATOCRIT: 40.9 % (ref 35.0–47.0)
HEMOGLOBIN: 13 g/dL (ref 12.0–16.0)
MCH: 24.7 pg — ABNORMAL LOW (ref 26.0–34.0)
MCHC: 31.9 g/dL — AB (ref 32.0–36.0)
MCV: 77.6 fL — ABNORMAL LOW (ref 80.0–100.0)
Platelets: 349 10*3/uL (ref 150–440)
RBC: 5.27 MIL/uL — AB (ref 3.80–5.20)
RDW: 15.6 % — ABNORMAL HIGH (ref 11.5–14.5)
WBC: 8.5 10*3/uL (ref 3.6–11.0)

## 2015-06-24 LAB — LIPID PANEL
CHOL/HDL RATIO: 4.8 ratio
CHOLESTEROL: 192 mg/dL (ref 0–200)
HDL: 40 mg/dL — ABNORMAL LOW (ref >40)
LDL Cholesterol: 138 mg/dL — ABNORMAL HIGH (ref 0–99)
TRIGLYCERIDES: 72 mg/dL (ref <150)
VLDL: 14 mg/dL (ref 0–40)

## 2015-06-24 LAB — IRON: IRON: 53 ug/dL (ref 28–170)

## 2015-06-24 LAB — VITAMIN B12: Vitamin B-12: 919 pg/mL — ABNORMAL HIGH (ref 180–914)

## 2015-06-26 LAB — VITAMIN B1: Vitamin B1 (Thiamine): 168.2 nmol/L (ref 66.5–200.0)

## 2015-06-26 LAB — VITAMIN D 25 HYDROXY (VIT D DEFICIENCY, FRACTURES): Vit D, 25-Hydroxy: 33.5 ng/mL (ref 30.0–100.0)

## 2015-07-04 ENCOUNTER — Encounter: Payer: 59 | Attending: Surgery | Admitting: Dietician

## 2015-07-04 ENCOUNTER — Encounter: Payer: Self-pay | Admitting: Dietician

## 2015-07-04 VITALS — Ht 63.0 in | Wt 166.0 lb

## 2015-07-04 DIAGNOSIS — Z713 Dietary counseling and surveillance: Secondary | ICD-10-CM | POA: Insufficient documentation

## 2015-07-04 DIAGNOSIS — Z6839 Body mass index (BMI) 39.0-39.9, adult: Secondary | ICD-10-CM | POA: Insufficient documentation

## 2015-07-04 DIAGNOSIS — E669 Obesity, unspecified: Secondary | ICD-10-CM | POA: Diagnosis not present

## 2015-07-04 NOTE — Progress Notes (Signed)
  Follow-up visit:  8 months Post-Operative Gastric sleeve Surgery  Medical Nutrition Therapy:  Appt start time: 200 end time:  235  Primary concerns today: Post-operative Bariatric Surgery Nutrition Management. Shannon Soto returns having lost another 8 pounds. Still hoping to reach 160 lbs and then ultimately wants to be 145 lbs. She has noticed that her rate of weight loss has really slowed down. She weighs herself about 1x a month. Has had some nausea, taking Zofran and eating crackers. Has not yet followed up with Dr. Ardath Sax.  Surgery date: 11/05/2014 Surgery type: Gastric sleeve Start weight at [facility]: 222 lbs on 08/30/14 Weight today: 166 lbs Weight change: 8 lbs Total weight lost: 56 lbs  Weight loss goal: 160 lbs   TANITA  BODY COMP RESULTS  10/15/14 11/20/14 01/03/15 03/21/15 05/02/15 07/04/15   BMI (kg/m^2) 39.3 35.3 33.8 31.7 30.8 29.4   Fat Mass (lbs) 109 98.5 86 76.5 72.5 68   Fat Free Mass (lbs) 113 101.0 105 102.5 101.5 98   Total Body Water (lbs) 82.5 74.0 77 75 74.5 71.5    Preferred Learning Style:   No preference indicated   Learning Readiness:   Ready  24-hr recall: B (AM): scrambled egg sometimes with with cheese and small amount of toast (10g) Snk (AM): 1/2 atkins protein shake (6g) Snk (AM: 4-5 grapes L (PM): caesar salad with iceberg and spinach with grilled chicken or shrimp with 2 Tbsp lite [place] dressing (14g) Snk (2PM): Ritz crackers or salted peanuts if she feels nauseated, rest of protein shake (7g)   D (6PM): 3 oz grilled chicken OR [place] sausage, grilled zucchini, onions, and broccoli (21g) Snk (10PM): not usually, had cornflakes one night  Fluid intake: 58 oz water, Atkins protein shake (11 oz); about 64 oz total per patient Estimated total protein intake: 68g  Medications: no longer taking one of her BP meds and metformin Supplementation: taking in pill form  Using straws: no Drinking while eating: no Hair loss: getting better, still taking  Biotin Carbonated beverages: no N/V/D/C: constipation, still taking Miralax as needed  Recent physical activity:  None recently due to abdominal pain/numbness  Progress Towards Goal(s):  In progress.   Nutritional Diagnosis:  [place]-3.3 Overweight/obesity related to past poor dietary habits and physical inactivity as evidenced by patient w/ recent gastric sleeve surgery following dietary guidelines for continued weight loss.     Intervention:  Nutrition counseling provided.  Teaching Method Utilized:  Visual Auditory Hands on  Barriers to learning/adherence to lifestyle change: none  Demonstrated degree of understanding via:  Teach Back   Monitoring/Evaluation:  Dietary intake, exercise, and body weight. Follow up in 2 months for 8 month post-op visit.

## 2015-07-04 NOTE — Patient Instructions (Addendum)
Goals:  Follow Phase 3B: High Protein + Non-Starchy Vegetables  Eat 3-6 small meals/snacks, every 3-5 hrs  Increase lean protein foods to meet 60g goal  Increase fluid intake to 64oz +  Avoid drinking 15 minutes before, during and 30 minutes after eating  Aim for >30 min of physical activity daily  Limit carbs (fruit) to about 15 grams or 1/2 cup per meal  Limit cornflakes to 1/2 cup and avoid fruit and toast that day  Make sure to have a protein snack at least 1x a day  Develop an exercise routine  Try having raw vegetables (carrots, broccoli, peppers, celery) with yogurt Ranch dip (plain [place] yogurt with [facility] seasoning)   TANITA  BODY COMP RESULTS  10/15/14 11/20/14 01/03/15 03/21/15 05/02/15 07/04/15   BMI (kg/m^2) 39.3 35.3 33.8 31.7 30.8 29.4   Fat Mass (lbs) 109 98.5 86 76.5 72.5 68   Fat Free Mass (lbs) 113 101.0 105 102.5 101.5 98   Total Body Water (lbs) 82.5 74.0 77 75 74.5 71.5

## 2015-07-05 ENCOUNTER — Ambulatory Visit: Payer: 59 | Admitting: Family Medicine

## 2015-07-05 ENCOUNTER — Ambulatory Visit: Payer: Self-pay | Admitting: Family Medicine

## 2015-09-06 ENCOUNTER — Ambulatory Visit (INDEPENDENT_AMBULATORY_CARE_PROVIDER_SITE_OTHER): Payer: 59 | Admitting: Family Medicine

## 2015-09-06 ENCOUNTER — Encounter: Payer: Self-pay | Admitting: Family Medicine

## 2015-09-06 VITALS — BP 120/88 | HR 123 | Temp 98.0°F | Resp 18 | Ht 63.0 in | Wt 160.6 lb

## 2015-09-06 DIAGNOSIS — L304 Erythema intertrigo: Secondary | ICD-10-CM | POA: Diagnosis not present

## 2015-09-06 DIAGNOSIS — G629 Polyneuropathy, unspecified: Secondary | ICD-10-CM

## 2015-09-06 DIAGNOSIS — E785 Hyperlipidemia, unspecified: Secondary | ICD-10-CM | POA: Diagnosis not present

## 2015-09-06 DIAGNOSIS — F5104 Psychophysiologic insomnia: Secondary | ICD-10-CM

## 2015-09-06 DIAGNOSIS — G4700 Insomnia, unspecified: Secondary | ICD-10-CM | POA: Diagnosis not present

## 2015-09-06 DIAGNOSIS — Z23 Encounter for immunization: Secondary | ICD-10-CM

## 2015-09-06 DIAGNOSIS — F418 Other specified anxiety disorders: Secondary | ICD-10-CM | POA: Diagnosis not present

## 2015-09-06 DIAGNOSIS — E1143 Type 2 diabetes mellitus with diabetic autonomic (poly)neuropathy: Secondary | ICD-10-CM | POA: Diagnosis not present

## 2015-09-06 DIAGNOSIS — Z9884 Bariatric surgery status: Secondary | ICD-10-CM | POA: Diagnosis not present

## 2015-09-06 LAB — POCT GLYCOSYLATED HEMOGLOBIN (HGB A1C): HEMOGLOBIN A1C: 5.9

## 2015-09-06 MED ORDER — ZOLPIDEM TARTRATE 10 MG PO TABS: 10.0000 mg | ORAL_TABLET | Freq: Every evening | ORAL | Status: DC | PRN

## 2015-09-06 MED ORDER — GABAPENTIN 600 MG PO TABS: 600.0000 mg | ORAL_TABLET | Freq: Three times a day (TID) | ORAL | Status: DC

## 2015-09-06 MED ORDER — NYSTATIN EX POWD: 1.0000 | Freq: Two times a day (BID) | CUTANEOUS | Status: DC | PRN

## 2015-09-06 NOTE — Progress Notes (Signed)
Name: Shannon Soto   MRN: [id number]    DOB: 17-Dec-1970   Date:09/06/2015       Progress Note  Subjective  Chief Complaint  Chief Complaint  Patient presents with  . Medication Refill    4 month F/U  . Insomnia    Medication helps, sleeps around 5 hours nightly-trouble falling asleep  . Migraine    Unchanged  . Depression with anxiety    Well controlled with medications  . Rash    Nystatin helps better with her rash than the hydrocortisone cream    HPI  Bariatric Surgery: she had sleeve surgery done by Dr. Hassell Done on March 14th, 2016, she has achieved her goal weight of 160 lbs. Total weight loss of 66 lbs. She is doing well,she was given Adipex by Dr. Hassell Done to take during the holiday season. She states some days she is not able to eat enough protein but she is complaint with supplements. She has recurrent rash under breast and abdominal folds from the significant weight loss and has to use Nystatin prn.   Hyperlipidemia: she is off Pravastatin since surgery.   HTN: off medication since bariatric surgery, no chest pain, no palpitation   Insomnia: taking medication and is tolerating it well, denies side effects, only sleeps about 5 hours on medication - because she needs to get up early ( she has a son with special needs and does not have enough hours to sleep during the week days ), she denies naps during the day. She sleeps about 8 hours on weekends, because she does not have to get up early . She goes to bed late on the weekdays, between 11:30 and midnight. She denies grogginess during the day.  Peripheral neuropathy: she is still taking gabapentin two to three times daily , pain on her legs are still present, aching and throbbing, but better with medication  DMII with peripheral neuropathy: off metformin since surgery, no polyphagia, polydipsia or polyuria. On Gabapentin   Depression/Anxiety: taking Fluoxetine, sometimes she takes twice daily because of stress at work, but  explained that it does not work that way. Explained that we can increase to '20mg'$  daily, she prefers taking it twice daily. No side effects. Denies crying spells, or anhedonia.   Migraines: she has tried multiple medications, but could not tolerate, she take Ibuprofen prn, episodes about 2 times weekly. Pain is behind her eyes, throbbing like, associated with nausea at times, photophobia. It can last up to 4 hours with medication  She is aware that she should not be taking nsaid's since bariatric surgery because of increase chance of ulcer formation and GI bleed.   Patient Active Problem List   Diagnosis Date Noted  . Intertrigo 09/06/2015  . Migraine headache without aura 02/28/2015  . Obesity (BMI 30-39.9) 02/28/2015  . Hyperlipidemia 02/28/2015  . GERD (gastroesophageal reflux disease) 02/28/2015  . Vitamin D deficiency 02/28/2015  . Chronic constipation 02/28/2015  . Chronic insomnia 02/28/2015  . Peripheral neuropathy ([place]) 02/28/2015  . Vitiligo 02/28/2015  . Lower leg mass 02/28/2015  . Depression with anxiety 02/28/2015  . History of bariatric surgery 11/05/2014    Past Surgical History  Procedure Laterality Date  . Breath tek h pylori N/A 08/27/2014    Procedure: BREATH TEK H PYLORI;  Surgeon: Pedro Earls, MD;  Location: [facility];  Service: General;  Laterality: N/A;  . Appendectomy    . Breast surgery    . Abdominal hysterectomy    . Laparoscopic  gastric sleeve resection N/A 11/05/2014    Procedure: LAPAROSCOPIC GASTRIC SLEEVE RESECTION;  Surgeon: Johnathan Hausen, MD;  Location: [facility];  Service: General;  Laterality: N/A;  . Hiatal hernia repair  11/05/2014    Procedure: LAPAROSCOPIC REPAIR OF HIATAL HERNIA;  Surgeon: Johnathan Hausen, MD;  Location: [facility];  Service: General;;  . Upper gi endoscopy  11/05/2014    Procedure: UPPER GI ENDOSCOPY;  Surgeon: Johnathan Hausen, MD;  Location: [facility];  Service: General;;    Family History  Problem Relation Age of Onset  .  Diabetes Paternal Uncle   . Stroke Father   . Hypertension Other     Social History   Social History  . Marital Status: Single    Spouse Name: N/A  . Number of Children: N/A  . Years of Education: N/A   Occupational History  . Not on file.   Social History Main Topics  . Smoking status: Never Smoker   . Smokeless tobacco: Never Used  . Alcohol Use: No     Comment: occasional   . Drug Use: No  . Sexual Activity: Yes   Other Topics Concern  . Not on file   Social History Narrative     Current outpatient prescriptions:  .  Calcium Carb-Cholecalciferol (CALCIUM-VITAMIN D3) 500-400 MG-UNIT TABS, Take 1 tablet by mouth 3 (three) times daily., Disp: , Rfl:  .  cyanocobalamin 500 MCG tablet, Take 1 mcg by mouth every morning., Disp: , Rfl:  .  ferrous gluconate ([employer] IRON) 240 (27 FE) MG tablet, Take 1 tablet (240 mg total) by mouth 3 (three) times daily with meals., Disp: 30 tablet, Rfl: 0 .  FLUoxetine (PROZAC) 10 MG capsule, Take 1 capsule (10 mg total) by mouth 2 (two) times daily., Disp: 180 capsule, Rfl: 0 .  gabapentin (NEURONTIN) 600 MG tablet, Take 1 tablet (600 mg total) by mouth 3 (three) times daily., Disp: 270 tablet, Rfl: 1 .  Multiple Vitamin (MULTIVITAMIN WITH MINERALS) TABS tablet, Take 1 tablet by mouth 2 (two) times daily., Disp: , Rfl:  .  NYSTATIN, TOPICAL, POWD, Apply 1 each topically 2 (two) times daily as needed., Disp: 60 g, Rfl: 5 .  ondansetron (ZOFRAN ODT) 4 MG disintegrating tablet, Take 1 tablet (4 mg total) by mouth every 8 (eight) hours as needed for nausea or vomiting., Disp: 20 tablet, Rfl: 0 .  phentermine (ADIPEX-P) 37.5 MG tablet, , Disp: , Rfl: 3 .  polyethylene glycol powder (GLYCOLAX/MIRALAX) powder, Take 17 g by mouth 2 (two) times daily as needed., Disp: 3350 g, Rfl: 1 .  zolpidem (AMBIEN) 10 MG tablet, Take 1 tablet (10 mg total) by mouth at bedtime as needed for sleep (Take 1 and 1/2 tablet at bedtime.)., Disp: 135 tablet, Rfl: 0  No  Known Allergies   ROS  Constitutional: Negative for fever, positive for  weight change.  Respiratory: Negative for cough and shortness of breath.   Cardiovascular: Negative for chest pain or palpitations.  Gastrointestinal: Negative for abdominal pain, no bowel changes.  Musculoskeletal: Negative for gait problem or joint swelling.  Skin: Negative for rash.  Neurological: Negative for dizziness or headache.  No other specific complaints in a complete review of systems (except as listed in HPI above).  Objective  Filed Vitals:   09/06/15 1329  BP: 120/88  Pulse: 123  Temp: 98 F (36.7 C)  TempSrc: Oral  Resp: 18  Height: '5\' 3"'$  (1.6 m)  Weight: 160 lb 9.6 oz (72.848 kg)  SpO2:  98%    Body mass index is 28.46 kg/(m^2).  Physical Exam  Constitutional: Patient appears well-developed and , overweight.  No distress.  HEENT: head atraumatic, normocephalic, pupils equal and reactive to light,  neck supple, throat within normal limits Cardiovascular: Normal rate, regular rhythm and normal heart sounds.  No murmur heard. No BLE edema. Pulmonary/Chest: Effort normal and breath sounds normal. No respiratory distress. Abdominal: Soft.  There is no tenderness. Extra skin, with abdominal folds secondary to weight loss Psychiatric: Patient has a normal mood and affect. behavior is normal. Judgment and thought content normal. Rash: mild erythematous rash with satellite lesion on abdominal fold.   Recent Results (from the past 2160 hour(s))  Vit D  25 hydroxy (rtn osteoporosis monitoring)     Status: None   Collection Time: 06/24/15  7:29 AM  Result Value Ref Range   Vit D, 25-Hydroxy 33.5 30.0 - 100.0 ng/mL    Comment: (NOTE) Vitamin D deficiency has been defined by the [place] practice guideline as a level of serum 25-OH vitamin D less than 20 ng/mL (1,2). The Endocrine Society went on to further define vitamin D insufficiency as a level  between 21 and 29 ng/mL (2). 1. IOM (Institute of Medicine). 2010. Dietary reference   intakes for calcium and D. [place]: The   [employer]. 2. Holick MF, Binkley Vandiver, Bischoff-Ferrari HA, et al.   Evaluation, treatment, and prevention of vitamin D   deficiency: an Endocrine Society clinical practice   guideline. JCEM. 2011 Jul; 96(7):1911-30. Performed At: [facility] [place], [place] [id number] Lindon Romp MD NF:[id number]   Iron     Status: None   Collection Time: 06/24/15  7:29 AM  Result Value Ref Range   Iron 53 28 - 170 ug/dL  CBC     Status: Abnormal   Collection Time: 06/24/15  7:29 AM  Result Value Ref Range   WBC 8.5 3.6 - 11.0 K/uL   RBC 5.27 (H) 3.80 - 5.20 MIL/uL   Hemoglobin 13.0 12.0 - 16.0 g/dL   HCT 40.9 35.0 - 47.0 %   MCV 77.6 (L) 80.0 - 100.0 fL   MCH 24.7 (L) 26.0 - 34.0 pg   MCHC 31.9 (L) 32.0 - 36.0 g/dL   RDW 15.6 (H) 11.5 - 14.5 %   Platelets 349 150 - 440 K/uL  Vitamin B12     Status: Abnormal   Collection Time: 06/24/15  7:29 AM  Result Value Ref Range   Vitamin B-12 919 (H) 180 - 914 pg/mL    Comment: (NOTE) This assay is not validated for testing neonatal or myeloproliferative syndrome specimens for Vitamin B12 levels. Performed at [facility]   Lipid panel     Status: Abnormal   Collection Time: 06/24/15  7:29 AM  Result Value Ref Range   Cholesterol 192 0 - 200 mg/dL   Triglycerides 72 <150 mg/dL   HDL 40 (L) >40 mg/dL   Total CHOL/HDL Ratio 4.8 RATIO   VLDL 14 0 - 40 mg/dL   LDL Cholesterol 138 (H) 0 - 99 mg/dL    Comment:        Total Cholesterol/HDL:CHD Risk Coronary Heart Disease Risk Table                     Men   Women  1/2 Average Risk   3.4   3.3  Average Risk  5.0   4.4  2 X Average Risk   9.6   7.1  3 X Average Risk  23.4   11.0        Use the calculated Patient Ratio above and the CHD Risk Table to determine the patient's CHD Risk.        ATP III  CLASSIFICATION (LDL):  <100     mg/dL   Optimal  100-129  mg/dL   Near or Above                    Optimal  130-159  mg/dL   Borderline  160-189  mg/dL   High  >190     mg/dL   Very High   Comprehensive metabolic panel     Status: Abnormal   Collection Time: 06/24/15  7:29 AM  Result Value Ref Range   Sodium 141 135 - 145 mmol/L   Potassium 3.5 3.5 - 5.1 mmol/L   Chloride 106 101 - 111 mmol/L   CO2 28 22 - 32 mmol/L   Glucose, Bld 106 (H) 65 - 99 mg/dL   BUN 20 6 - 20 mg/dL   Creatinine, Ser 0.73 0.44 - 1.00 mg/dL   Calcium 8.9 8.9 - 10.3 mg/dL   Total Protein 7.6 6.5 - 8.1 g/dL   Albumin 4.1 3.5 - 5.0 g/dL   AST 15 15 - 41 U/L   ALT 12 (L) 14 - 54 U/L   Alkaline Phosphatase 92 38 - 126 U/L   Total Bilirubin 0.4 0.3 - 1.2 mg/dL   GFR calc non Af Amer >60 >60 mL/min   GFR calc Af Amer >60 >60 mL/min    Comment: (NOTE) The eGFR has been calculated using the CKD EPI equation. This calculation has not been validated in all clinical situations. eGFR's persistently <60 mL/min signify possible Chronic Kidney Disease.    Anion gap 7 5 - 15  Vitamin B1     Status: None   Collection Time: 06/24/15  7:29 AM  Result Value Ref Range   Vitamin B1 (Thiamine) 168.2 66.5 - 200.0 nmol/L    Comment: (NOTE) Performed At: [facility] [place], [place] [id number] Lindon Romp MD NF:[id number]    PHQ2/9: Depression screen [facility] 2/9 09/06/2015 07/04/2015 05/02/2015 03/21/2015 02/28/2015  Decreased Interest 0 0 0 0 0  Down, Depressed, Hopeless 0 0 0 0 0  PHQ - 2 Score 0 0 0 0 0    Fall Risk: Fall Risk  09/06/2015 07/04/2015 05/02/2015 03/21/2015 02/28/2015  Falls in the past year? No No No No No     Functional Status Survey: Is the patient deaf or have difficulty hearing?: No Does the patient have difficulty seeing, even when wearing glasses/contacts?: No Does the patient have difficulty concentrating, remembering, or making decisions?: No Does the patient have  difficulty walking or climbing stairs?: No Does the patient have difficulty dressing or bathing?: No Does the patient have difficulty doing errands alone such as visiting a doctor's office or shopping?: No    Assessment & Plan  1. Type 2 diabetes mellitus with peripheral autonomic neuropathy (HCC)  Diet controlled , still using gabapentin for neuropathy - gabapentin (NEURONTIN) 600 MG tablet; Take 1 tablet (600 mg total) by mouth 3 (three) times daily.  Dispense: 270 tablet; Refill: 1 - POCT HgB A1C  2. History of bariatric surgery  Continue follow up with Dr. Gerarda Gunther  3. Hyperlipidemia  Off medication   4. Chronic insomnia  -  zolpidem (AMBIEN) 10 MG tablet; Take 1 tablet (10 mg total) by mouth at bedtime as needed for sleep (Take 1 and 1/2 tablet at bedtime.).  Dispense: 135 tablet; Refill: 0  5. Depression with anxiety  Doing well on medication  6. Peripheral polyneuropathy (HCC)  stable - gabapentin (NEURONTIN) 600 MG tablet; Take 1 tablet (600 mg total) by mouth 3 (three) times daily.  Dispense: 270 tablet; Refill: 1  7. Need for pneumococcal vaccination  - Pneumococcal polysaccharide vaccine 23-valent greater than or equal to 2yo subcutaneous/IM  8. Intertrigo  - NYSTATIN, TOPICAL, POWD; Apply 1 each topically 2 (two) times daily as needed.  Dispense: 60 g; Refill: 5

## 2015-09-19 ENCOUNTER — Encounter: Payer: 59 | Attending: Surgery | Admitting: Dietician

## 2015-09-19 ENCOUNTER — Encounter: Payer: Self-pay | Admitting: Dietician

## 2015-09-19 DIAGNOSIS — E669 Obesity, unspecified: Secondary | ICD-10-CM | POA: Diagnosis not present

## 2015-09-19 DIAGNOSIS — Z6839 Body mass index (BMI) 39.0-39.9, adult: Secondary | ICD-10-CM | POA: Insufficient documentation

## 2015-09-19 DIAGNOSIS — Z713 Dietary counseling and surveillance: Secondary | ICD-10-CM | POA: Diagnosis not present

## 2015-09-19 NOTE — Patient Instructions (Addendum)
Goals:  Follow Phase 3B: High Protein + Non-Starchy Vegetables  Eat 3-6 small meals/snacks, every 3-5 hrs  Increase lean protein foods to meet 60g goal  Increase fluid intake to 64oz +  Avoid drinking 15 minutes before, during and 30 minutes after eating  Aim for >30 min of physical activity daily  Limit carbs (fruit) to about 15 grams or 1/2 cup per meal  Limit cornflakes to 1/2 cup and avoid fruit and toast that day  Make sure to have a protein snack at least 1x a day  Develop an exercise routine  Try having raw vegetables (carrots, broccoli, peppers, celery) with yogurt Ranch dip (plain [place] yogurt with [facility] seasoning)   Surgery date: 11/05/2014 Surgery type: Gastric sleeve Start weight at [facility]: 222 lbs on 08/30/14 Weight today: 158 lbs Weight change: 8 lbs Total weight lost: 64 lbs  Weight loss goal: 145 lbs   TANITA  BODY COMP RESULTS  10/15/14 11/20/14 01/03/15 03/21/15 05/02/15 07/04/15 09/19/15   BMI (kg/m^2) 39.3 35.3 33.8 31.7 30.8 29.4 28   Fat Mass (lbs) 109 98.5 86 76.5 72.5 68 62   Fat Free Mass (lbs) 113 101.0 105 102.5 101.5 98 96   Total Body Water (lbs) 82.5 74.0 77 75 74.5 71.5 70.5

## 2015-09-19 NOTE — Progress Notes (Signed)
  Follow-up visit:  10 months Post-Operative Gastric sleeve Surgery  Medical Nutrition Therapy:  Appt start time: 155 end time:  220  Primary concerns today: Post-operative Bariatric Surgery Nutrition Management. Mallorie returns having lost another 8 pounds. Excited about her weight loss. She reports that she is still getting her protein in. Having a little nausea if she gets too hungry. She states that Dr. Hassell Done gave her some phentermine to take through the holidays and states that this helped. She reports that she is satisfied with what she is eating. Hopes to be at her goal weight of 145 lbs in the next 3-4 months.   Surgery date: 11/05/2014 Surgery type: Gastric sleeve Start weight at [facility]: 222 lbs on 08/30/14 Weight today: 158 lbs Weight change: 8 lbs Total weight lost: 64 lbs  Weight loss goal: 145 lbs   TANITA  BODY COMP RESULTS  10/15/14 11/20/14 01/03/15 03/21/15 05/02/15 07/04/15 09/19/15   BMI (kg/m^2) 39.3 35.3 33.8 31.7 30.8 29.4 28   Fat Mass (lbs) 109 98.5 86 76.5 72.5 68 62   Fat Free Mass (lbs) 113 101.0 105 102.5 101.5 98 96   Total Body Water (lbs) 82.5 74.0 77 75 74.5 71.5 70.5    Preferred Learning Style:   No preference indicated   Learning Readiness:   Ready  24-hr recall: B (AM): scrambled egg sometimes with with cheese and small amount of toast (10g) Snk (AM): 1/2 atkins protein shake (6g) Snk (AM: 4-5 grapes L (PM): 1/2 lean hotdog and [employer] chili (14g) Snk (2PM): Ritz crackers or salted peanuts if she feels nauseated, rest of protein shake (7g)   D (6PM): 5-8 medium grilled shrimp salad with [place] dressing (14g) Snk (10PM): not usually, had cornflakes one night  Fluid intake: 16-20 oz water, 20 oz ICE water, Atkins protein shake (11-22 oz); about 47-62 oz total per patient Estimated total protein intake: 68g  Medications: no longer taking one of her BP meds and metformin Supplementation: taking in pill form  Using straws: no Drinking while eating:  no Hair loss: getting better, still taking Biotin Carbonated beverages: no N/V/D/C: constipation, still taking Miralax as needed  Recent physical activity:  Weight training at home 2x a week   Progress Towards Goal(s):  In progress.   Nutritional Diagnosis:  [place]-3.3 Overweight/obesity related to past poor dietary habits and physical inactivity as evidenced by patient w/ recent gastric sleeve surgery following dietary guidelines for continued weight loss.     Intervention:  Nutrition counseling provided.  Teaching Method Utilized:  Visual Auditory Hands on  Barriers to learning/adherence to lifestyle change: none  Demonstrated degree of understanding via:  Teach Back   Monitoring/Evaluation:  Dietary intake, exercise, and body weight. Follow up in 3 months for 13 month post-op visit.

## 2015-09-25 DIAGNOSIS — K912 Postsurgical malabsorption, not elsewhere classified: Secondary | ICD-10-CM | POA: Diagnosis not present

## 2015-10-10 ENCOUNTER — Other Ambulatory Visit: Payer: Self-pay | Admitting: Family Medicine

## 2015-10-10 DIAGNOSIS — Z1231 Encounter for screening mammogram for malignant neoplasm of breast: Secondary | ICD-10-CM

## 2015-10-11 ENCOUNTER — Ambulatory Visit
Admission: RE | Admit: 2015-10-11 | Discharge: 2015-10-11 | Disposition: A | Discharge status: Home / Self Care | Payer: 59 | Source: Ambulatory Visit | Attending: Family Medicine | Admitting: Family Medicine

## 2015-10-11 ENCOUNTER — Other Ambulatory Visit: Payer: Self-pay | Admitting: Family Medicine

## 2015-10-11 DIAGNOSIS — Z1231 Encounter for screening mammogram for malignant neoplasm of breast: Secondary | ICD-10-CM

## 2015-10-17 ENCOUNTER — Encounter: Payer: Self-pay | Admitting: Physician Assistant

## 2015-10-17 ENCOUNTER — Ambulatory Visit: Payer: Self-pay | Admitting: Physician Assistant

## 2015-10-17 VITALS — BP 140/100 | HR 78 | Temp 98.4°F

## 2015-10-17 DIAGNOSIS — T148XXA Other injury of unspecified body region, initial encounter: Secondary | ICD-10-CM

## 2015-10-17 MED ORDER — MUPIROCIN 2 % EX OINT: 1.0000 "application " | TOPICAL_OINTMENT | Freq: Two times a day (BID) | CUTANEOUS | Status: DC

## 2015-10-17 NOTE — Progress Notes (Signed)
S: hit hand on something and had open wound, area has scab on it but is swollen and sore, sx for 3 weeks, no drainage, no fever/chills  O: vitals wnl, nad, skin on hand with scab and darkened area, tender to touch, n/v intact  A: abrasion, ?infected  P: bactroban ointment, if not better in 5-7 days return to clinic for eval , ?refer to dermatology at that point

## 2015-10-24 DIAGNOSIS — K912 Postsurgical malabsorption, not elsewhere classified: Secondary | ICD-10-CM | POA: Diagnosis not present

## 2015-11-09 ENCOUNTER — Telehealth: Payer: 59 | Admitting: Nurse Practitioner

## 2015-11-09 DIAGNOSIS — J0101 Acute recurrent maxillary sinusitis: Secondary | ICD-10-CM

## 2015-11-09 MED ORDER — AMOXICILLIN-POT CLAVULANATE 875-125 MG PO TABS: 1.0000 | ORAL_TABLET | Freq: Two times a day (BID) | ORAL | Status: DC

## 2015-11-09 NOTE — Progress Notes (Signed)

## 2015-11-15 ENCOUNTER — Other Ambulatory Visit: Payer: Self-pay | Admitting: Family Medicine

## 2015-11-23 DIAGNOSIS — K912 Postsurgical malabsorption, not elsewhere classified: Secondary | ICD-10-CM | POA: Diagnosis not present

## 2015-12-24 DIAGNOSIS — K912 Postsurgical malabsorption, not elsewhere classified: Secondary | ICD-10-CM | POA: Diagnosis not present

## 2016-01-09 DIAGNOSIS — Z9884 Bariatric surgery status: Secondary | ICD-10-CM | POA: Diagnosis not present

## 2016-02-14 ENCOUNTER — Encounter: Payer: Self-pay | Admitting: Family Medicine

## 2016-02-17 ENCOUNTER — Other Ambulatory Visit: Payer: Self-pay | Admitting: Family Medicine

## 2016-02-17 MED ORDER — IBUPROFEN 800 MG PO TABS: 800.0000 mg | ORAL_TABLET | Freq: Three times a day (TID) | ORAL | Status: DC | PRN

## 2016-03-06 ENCOUNTER — Ambulatory Visit: Payer: 59 | Admitting: Family Medicine

## 2016-03-09 ENCOUNTER — Encounter: Payer: Self-pay | Admitting: Family Medicine

## 2016-03-09 ENCOUNTER — Other Ambulatory Visit: Payer: Self-pay | Admitting: Family Medicine

## 2016-03-09 DIAGNOSIS — F5104 Psychophysiologic insomnia: Secondary | ICD-10-CM

## 2016-03-09 MED ORDER — ZOLPIDEM TARTRATE 10 MG PO TABS: 10.0000 mg | ORAL_TABLET | Freq: Every day | ORAL | Status: DC

## 2016-03-18 ENCOUNTER — Encounter: Payer: Self-pay | Admitting: Family Medicine

## 2016-03-24 ENCOUNTER — Ambulatory Visit: Payer: 59 | Admitting: Family Medicine

## 2016-05-12 ENCOUNTER — Encounter: Payer: Self-pay | Admitting: Family Medicine

## 2016-05-12 ENCOUNTER — Ambulatory Visit (INDEPENDENT_AMBULATORY_CARE_PROVIDER_SITE_OTHER): Payer: 59 | Admitting: Family Medicine

## 2016-05-12 VITALS — BP 124/78 | HR 99 | Temp 99.3°F | Resp 18 | Ht 63.0 in | Wt 158.3 lb

## 2016-05-12 DIAGNOSIS — E785 Hyperlipidemia, unspecified: Secondary | ICD-10-CM | POA: Diagnosis not present

## 2016-05-12 DIAGNOSIS — K5900 Constipation, unspecified: Secondary | ICD-10-CM | POA: Diagnosis not present

## 2016-05-12 DIAGNOSIS — G43019 Migraine without aura, intractable, without status migrainosus: Secondary | ICD-10-CM

## 2016-05-12 DIAGNOSIS — Z23 Encounter for immunization: Secondary | ICD-10-CM

## 2016-05-12 DIAGNOSIS — I7300 Raynaud's syndrome without gangrene: Secondary | ICD-10-CM | POA: Diagnosis not present

## 2016-05-12 DIAGNOSIS — Z9884 Bariatric surgery status: Secondary | ICD-10-CM | POA: Diagnosis not present

## 2016-05-12 DIAGNOSIS — E559 Vitamin D deficiency, unspecified: Secondary | ICD-10-CM | POA: Diagnosis not present

## 2016-05-12 DIAGNOSIS — E1140 Type 2 diabetes mellitus with diabetic neuropathy, unspecified: Secondary | ICD-10-CM | POA: Insufficient documentation

## 2016-05-12 DIAGNOSIS — E1121 Type 2 diabetes mellitus with diabetic nephropathy: Secondary | ICD-10-CM | POA: Diagnosis not present

## 2016-05-12 DIAGNOSIS — G629 Polyneuropathy, unspecified: Secondary | ICD-10-CM | POA: Diagnosis not present

## 2016-05-12 DIAGNOSIS — F329 Major depressive disorder, single episode, unspecified: Secondary | ICD-10-CM | POA: Diagnosis not present

## 2016-05-12 DIAGNOSIS — G4700 Insomnia, unspecified: Secondary | ICD-10-CM | POA: Diagnosis not present

## 2016-05-12 DIAGNOSIS — F5104 Psychophysiologic insomnia: Secondary | ICD-10-CM

## 2016-05-12 DIAGNOSIS — K5909 Other constipation: Secondary | ICD-10-CM

## 2016-05-12 LAB — POCT GLYCOSYLATED HEMOGLOBIN (HGB A1C): HEMOGLOBIN A1C: 5.5

## 2016-05-12 LAB — POCT UA - MICROALBUMIN: MICROALBUMIN (UR) POC: 100 mg/L

## 2016-05-12 MED ORDER — FLUOXETINE HCL 10 MG PO CAPS: 10.0000 mg | ORAL_CAPSULE | Freq: Every day | ORAL | 1 refills | Status: DC

## 2016-05-12 MED ORDER — LOSARTAN POTASSIUM 50 MG PO TABS: 50.0000 mg | ORAL_TABLET | Freq: Every day | ORAL | 1 refills | Status: DC

## 2016-05-12 MED ORDER — ZOLPIDEM TARTRATE 10 MG PO TABS: 10.0000 mg | ORAL_TABLET | Freq: Every day | ORAL | 0 refills | Status: DC

## 2016-05-12 MED ORDER — GABAPENTIN 600 MG PO TABS: 600.0000 mg | ORAL_TABLET | Freq: Three times a day (TID) | ORAL | 1 refills | Status: DC

## 2016-05-12 MED ORDER — ONDANSETRON 4 MG PO TBDP
4.0000 mg | ORAL_TABLET | Freq: Three times a day (TID) | ORAL | 0 refills | Status: DC | PRN
Start: 2016-05-12 — End: 2016-09-15

## 2016-05-12 NOTE — Progress Notes (Signed)
Name: Shannon Soto   MRN: [id number]    DOB: April 18, 1971   Date:05/12/2016       Progress Note  Subjective  Chief Complaint  Chief Complaint  Patient presents with  . Diabetes    HPI  Bariatric Surgery: she had sleeve surgery done by Dr. Hassell Done on March 14th, 2016, she has achieved her goal weight of 160 lbs. Total weight loss of 66 lbs, gained a few lbs since last visit  She is doing well,she was given Adipex by Dr. Hassell Done to take during the holiday season, she takes it prn now. She states some days she is not able to eat enough protein but she is complaint with supplements. She has recurrent rash under breast and abdominal folds from the significant weight loss and has to use Nystatin prn and may need tummy tuck to improve symptoms   Hyperlipidemia: she is off Pravastatin since surgery. We will recheck labs  HTN: off medication since bariatric surgery, no chest pain, no palpitation   Insomnia: taking medication and is tolerating it well, denies side effects, only sleeps about 5 hours on medication - because she needs to get up early ( she has a son with special needs and does not have enough hours to sleep during the week days ), she denies naps during the day. She sleeps about 8 hours on weekends, because she does not have to get up early . She goes to bed late on the weekdays, between 11:30 and midnight. She denies grogginess during the day. We decreased dose to 10 mg but she is unable to go down any further because can't sleep without Ambien She is aware of FDA guidelines  DMII with peripheral neuropathy and now nephropathy:  off metformin since surgery - bariatric surgery in 10/2014 , no polyphagia, polydipsia or polyuria. On Gabapentin seems to help with neuropathy, she is not taking while at work because it makes her sleepy at times. We will re-start Losartan because she has 100 microalbuminuria  Major depression: taking Fluoxetine, sometimes she takes twice daily because of  stress at work, but explained that it does not work that way. Explained that we can increase to 20mg  daily, she prefers taking it twice daily, because taking 20 mg at once makes her feel jittery. No side effects. Denies crying spells, or anhedonia, just worries constantly about her son that has special needs  Migraines: she has tried multiple medications, but could not tolerate, she take Ibuprofen prn, episodes about 2 times weekly. Pain is behind her eyes, throbbing like, associated with nausea at times, photophobia. It can last up to 4 hours with medication  She is aware that she should not be taking nsaid's since bariatric surgery because of increase chance of ulcer formation and GI bleed. Explained that will not refill it for her, she can ask her bariatric surgeon for a prescription if needed. Offered adding baclofen or Nortriptyline but she refuses today  Raynaud's: she states that over the past two months she has noticed that fingers on both hands gets white , painful and numb, not sure of the triggers at this time, it can happen at any time. She denies joint pains or new rashes ( only intertrigos and eczema )  Patient Active Problem List   Diagnosis Date Noted  . Controlled type 2 diabetes mellitus with neuropathy ([place]) 05/12/2016  . Raynaud's phenomenon 05/12/2016  . Intertrigo 09/06/2015  . Migraine headache without aura 02/28/2015  . Obesity (BMI 30-39.9) 02/28/2015  .  Hyperlipidemia 02/28/2015  . GERD (gastroesophageal reflux disease) 02/28/2015  . Vitamin D deficiency 02/28/2015  . Chronic constipation 02/28/2015  . Chronic insomnia 02/28/2015  . Peripheral neuropathy ([place]) 02/28/2015  . Vitiligo 02/28/2015  . Lower leg mass 02/28/2015  . Depression with anxiety 02/28/2015  . History of bariatric surgery 11/05/2014    Past Surgical History:  Procedure Laterality Date  . ABDOMINAL HYSTERECTOMY    . APPENDECTOMY    . BREAST SURGERY    . BREATH TEK H PYLORI N/A 08/27/2014    Procedure: BREATH TEK H PYLORI;  Surgeon: Pedro Earls, MD;  Location: [facility];  Service: General;  Laterality: N/A;  . HIATAL HERNIA REPAIR  11/05/2014   Procedure: LAPAROSCOPIC REPAIR OF HIATAL HERNIA;  Surgeon: Johnathan Hausen, MD;  Location: [facility];  Service: General;;  . LAPAROSCOPIC GASTRIC SLEEVE RESECTION N/A 11/05/2014   Procedure: LAPAROSCOPIC GASTRIC SLEEVE RESECTION;  Surgeon: Johnathan Hausen, MD;  Location: [facility];  Service: General;  Laterality: N/A;  . REDUCTION MAMMAPLASTY Bilateral 1997  . UPPER GI ENDOSCOPY  11/05/2014   Procedure: UPPER GI ENDOSCOPY;  Surgeon: Johnathan Hausen, MD;  Location: [facility];  Service: General;;    Family History  Problem Relation Age of Onset  . Diabetes Paternal Uncle   . Stroke Father   . Hypertension Other   . Breast cancer Maternal Grandmother 63    Social History   Social History  . Marital status: Single    Spouse name: N/A  . Number of children: N/A  . Years of education: N/A   Occupational History  . Not on file.   Social History Main Topics  . Smoking status: Never Smoker  . Smokeless tobacco: Never Used  . Alcohol use No     Comment: occasional   . Drug use: No  . Sexual activity: Yes   Other Topics Concern  . Not on file   Social History Narrative  . No narrative on file     Current Outpatient Prescriptions:  .  Calcium Carb-Cholecalciferol (CALCIUM-VITAMIN D3) 500-400 MG-UNIT TABS, Take 1 tablet by mouth 3 (three) times daily., Disp: , Rfl:  .  cyanocobalamin 500 MCG tablet, Take 1 mcg by mouth every morning., Disp: , Rfl:  .  ferrous gluconate ([employer] IRON) 240 (27 FE) MG tablet, Take 1 tablet (240 mg total) by mouth 3 (three) times daily with meals., Disp: 30 tablet, Rfl: 0 .  FLUoxetine (PROZAC) 10 MG capsule, Take 1 capsule (10 mg total) by mouth daily., Disp: 180 capsule, Rfl: 1 .  gabapentin (NEURONTIN) 600 MG tablet, Take 1 tablet (600 mg total) by mouth 3 (three) times daily., Disp: 270 tablet, Rfl: 1 .   Multiple Vitamin (MULTIVITAMIN WITH MINERALS) TABS tablet, Take 1 tablet by mouth 2 (two) times daily., Disp: , Rfl:  .  NYSTATIN, TOPICAL, POWD, Apply 1 each topically 2 (two) times daily as needed., Disp: 60 g, Rfl: 5 .  ondansetron (ZOFRAN ODT) 4 MG disintegrating tablet, Take 1 tablet (4 mg total) by mouth every 8 (eight) hours as needed for nausea or vomiting., Disp: 20 tablet, Rfl: 0 .  phentermine (ADIPEX-P) 37.5 MG tablet, Take 1 tablet by mouth daily., Disp: , Rfl: 3 .  polyethylene glycol powder (GLYCOLAX/MIRALAX) powder, Take 17 g by mouth 2 (two) times daily as needed., Disp: 3350 g, Rfl: 1 .  zolpidem (AMBIEN) 10 MG tablet, Take 1 tablet (10 mg total) by mouth at bedtime. Fill October 15 th, 2017, Disp: 90 tablet, Rfl:  0  No Known Allergies   ROS  Constitutional: Negative for fever or weight change.  Respiratory: Negative for cough and shortness of breath.   Cardiovascular: Negative for chest pain or palpitations.  Gastrointestinal: Negative for abdominal pain, no bowel changes.  Musculoskeletal: Negative for gait problem or joint swelling.  Skin: Negative for rash.  Neurological: Negative for dizziness , positive for intermittent headache.  No other specific complaints in a complete review of systems (except as listed in HPI above).  Objective  Vitals:   05/12/16 1319  BP: 124/78  Pulse: 99  Resp: 18  Temp: 99.3 F (37.4 C)  SpO2: 93%  Weight: 158 lb 5 oz (71.8 kg)  Height: 5\' 3"  (1.6 m)    Body mass index is 28.04 kg/m.  Physical Exam  Constitutional: Patient appears well-developed and well-nourished.No distress.  HEENT: head atraumatic, normocephalic, pupils equal and reactive to light, neck supple, throat within normal limits Cardiovascular: Normal rate, regular rhythm and normal heart sounds.  No murmur heard. No BLE edema. Pulmonary/Chest: Effort normal and breath sounds normal. No respiratory distress. Abdominal: Soft.  There is no  tenderness. Psychiatric: Patient has a normal mood and affect. behavior is normal. Judgment and thought content normal.  Recent Results (from the past 2160 hour(s))  POCT HgB A1C     Status: None   Collection Time: 05/12/16  1:43 PM  Result Value Ref Range   Hemoglobin A1C 5.5   POCT UA - Microalbumin     Status: None   Collection Time: 05/12/16  1:43 PM  Result Value Ref Range   Microalbumin Ur, POC 100 mg/L   Creatinine, POC  mg/dL   Albumin/Creatinine Ratio, Urine, POC      Diabetic Foot Exam: Diabetic Foot Exam - Simple   Simple Foot Form Diabetic Foot exam was performed with the following findings:  Yes 05/12/2016  1:51 PM  Visual Inspection No deformities, no ulcerations, no other skin breakdown bilaterally:  Yes Sensation Testing Intact to touch and monofilament testing bilaterally:  Yes Pulse Check Posterior Tibialis and Dorsalis pulse intact bilaterally:  Yes Comments     PHQ2/9: Depression screen [facility] 2/9 05/12/2016 09/19/2015 09/06/2015 07/04/2015 05/02/2015  Decreased Interest 0 0 0 0 0  Down, Depressed, Hopeless 0 0 0 0 0  PHQ - 2 Score 0 0 0 0 0     Fall Risk: Fall Risk  05/12/2016 09/19/2015 09/06/2015 07/04/2015 05/02/2015  Falls in the past year? No No No No No      Functional Status Survey: Is the patient deaf or have difficulty hearing?: No Does the patient have difficulty seeing, even when wearing glasses/contacts?: Yes Does the patient have difficulty concentrating, remembering, or making decisions?: No Does the patient have difficulty walking or climbing stairs?: No Does the patient have difficulty dressing or bathing?: No Does the patient have difficulty doing errands alone such as visiting a doctor's office or shopping?: No    Assessment & Plan  1. Controlled type 2 diabetes with neuropathy (HCC)  - POCT HgB A1C - POCT UA - Microalbumin - gabapentin (NEURONTIN) 600 MG tablet; Take 1 tablet (600 mg total) by mouth 3 (three) times daily.   Dispense: 270 tablet; Refill: 1 - COMPLETE METABOLIC PANEL WITH GFR  2. History of bariatric surgery  - CBC with Differential/Platelet - COMPLETE METABOLIC PANEL WITH GFR - Vitamin B12 - Vitamin B1  3. Hyperlipidemia  - COMPLETE METABOLIC PANEL WITH GFR - Lipid panel  4. Chronic insomnia  -  zolpidem (AMBIEN) 10 MG tablet; Take 1 tablet (10 mg total) by mouth at bedtime. Fill October 15 th, 2017  Dispense: 90 tablet; Refill: 0  5. Major depression, chronic (HCC)  - FLUoxetine (PROZAC) 10 MG capsule; Take 1 capsule (10 mg total) by mouth daily.  Dispense: 180 capsule; Refill: 1  6. Peripheral polyneuropathy (HCC)  - gabapentin (NEURONTIN) 600 MG tablet; Take 1 tablet (600 mg total) by mouth 3 (three) times daily.  Dispense: 270 tablet; Refill: 1  7. Intractable migraine without aura and without status migrainosus  Stop Motrin and goody powders try tylenol, sleep and re-consider adding baclofen or Nortriptyline  - ondansetron (ZOFRAN ODT) 4 MG disintegrating tablet; Take 1 tablet (4 mg total) by mouth every 8 (eight) hours as needed for nausea or vomiting.  Dispense: 20 tablet; Refill: 0  8. Chronic constipation  Continue Miralax   9. Vitamin D deficiency  - VITAMIN D 25 Hydroxy (Vit-D Deficiency, Fractures)  10. Needs flu shot  - Flu Vaccine QUAD 36+ mos IM - not given, she will get it at work  11. Need for Tdap vaccination  - Tdap vaccine greater than or equal to 7yo IM - not given we will get records from [facility]  12. Raynaud's phenomenon  Discussed getting lupus panel but she would like to hold off for now  13. Type 2 diabetes with nephropathy (HCC)  - losartan (COZAAR) 50 MG tablet; Take 1 tablet (50 mg total) by mouth daily.  Dispense: 90 tablet; Refill: 1

## 2016-05-12 NOTE — Patient Instructions (Signed)

## 2016-07-21 ENCOUNTER — Encounter: Payer: Self-pay | Admitting: Family Medicine

## 2016-08-05 ENCOUNTER — Encounter: Payer: Self-pay | Admitting: Family Medicine

## 2016-08-07 ENCOUNTER — Other Ambulatory Visit: Payer: Self-pay | Admitting: Family Medicine

## 2016-08-07 MED ORDER — AMLODIPINE BESYLATE 5 MG PO TABS: 5.0000 mg | ORAL_TABLET | Freq: Every day | ORAL | 3 refills | Status: DC

## 2016-08-16 ENCOUNTER — Telehealth: Payer: 59 | Admitting: Family

## 2016-08-16 DIAGNOSIS — B9789 Other viral agents as the cause of diseases classified elsewhere: Secondary | ICD-10-CM | POA: Diagnosis not present

## 2016-08-16 DIAGNOSIS — J329 Chronic sinusitis, unspecified: Secondary | ICD-10-CM

## 2016-08-16 MED ORDER — FLUTICASONE PROPIONATE 50 MCG/ACT NA SUSP: 2.0000 | Freq: Every day | NASAL | 6 refills | Status: DC

## 2016-08-16 NOTE — Progress Notes (Signed)

## 2016-09-01 ENCOUNTER — Ambulatory Visit: Payer: 59 | Admitting: Family Medicine

## 2016-09-11 ENCOUNTER — Encounter: Payer: Self-pay | Admitting: Family Medicine

## 2016-09-15 ENCOUNTER — Ambulatory Visit (INDEPENDENT_AMBULATORY_CARE_PROVIDER_SITE_OTHER): Payer: 59 | Admitting: Family Medicine

## 2016-09-15 ENCOUNTER — Encounter: Payer: Self-pay | Admitting: Family Medicine

## 2016-09-15 VITALS — BP 122/76 | HR 84 | Temp 98.3°F | Resp 16 | Wt 161.0 lb

## 2016-09-15 DIAGNOSIS — K5909 Other constipation: Secondary | ICD-10-CM

## 2016-09-15 DIAGNOSIS — I7300 Raynaud's syndrome without gangrene: Secondary | ICD-10-CM

## 2016-09-15 DIAGNOSIS — G629 Polyneuropathy, unspecified: Secondary | ICD-10-CM | POA: Diagnosis not present

## 2016-09-15 DIAGNOSIS — F5104 Psychophysiologic insomnia: Secondary | ICD-10-CM | POA: Diagnosis not present

## 2016-09-15 DIAGNOSIS — E1121 Type 2 diabetes mellitus with diabetic nephropathy: Secondary | ICD-10-CM

## 2016-09-15 DIAGNOSIS — G43019 Migraine without aura, intractable, without status migrainosus: Secondary | ICD-10-CM | POA: Diagnosis not present

## 2016-09-15 DIAGNOSIS — E1140 Type 2 diabetes mellitus with diabetic neuropathy, unspecified: Secondary | ICD-10-CM

## 2016-09-15 DIAGNOSIS — G43009 Migraine without aura, not intractable, without status migrainosus: Secondary | ICD-10-CM | POA: Diagnosis not present

## 2016-09-15 DIAGNOSIS — E782 Mixed hyperlipidemia: Secondary | ICD-10-CM

## 2016-09-15 DIAGNOSIS — Z9884 Bariatric surgery status: Secondary | ICD-10-CM

## 2016-09-15 DIAGNOSIS — F329 Major depressive disorder, single episode, unspecified: Secondary | ICD-10-CM | POA: Diagnosis not present

## 2016-09-15 LAB — POCT GLYCOSYLATED HEMOGLOBIN (HGB A1C): HEMOGLOBIN A1C: 5.5

## 2016-09-15 MED ORDER — ZOLPIDEM TARTRATE 10 MG PO TABS: 10.0000 mg | ORAL_TABLET | Freq: Every day | ORAL | 0 refills | Status: DC

## 2016-09-15 MED ORDER — TRIAMCINOLONE ACETONIDE 0.1 % EX CREA: 1.0000 "application " | TOPICAL_CREAM | Freq: Two times a day (BID) | CUTANEOUS | 0 refills | Status: DC

## 2016-09-15 MED ORDER — POLYETHYLENE GLYCOL 3350 17 GM/SCOOP PO POWD: 17.0000 g | Freq: Two times a day (BID) | ORAL | 1 refills | Status: DC | PRN

## 2016-09-15 MED ORDER — ONDANSETRON 4 MG PO TBDP: 4.0000 mg | ORAL_TABLET | Freq: Three times a day (TID) | ORAL | 0 refills | Status: DC | PRN

## 2016-09-15 NOTE — Progress Notes (Signed)
Name: Shannon Soto   MRN: [id number]    DOB: Jul 26, 1971   Date:09/15/2016       Progress Note  Subjective  Chief Complaint  Chief Complaint  Patient presents with  . Hypertension  . Diabetes    pt not checking blood sugars  . Migraine    most recent one yesterday    HPI  Bariatric Surgery: she had sleeve surgery done by Dr. Hassell Done on March 14th, 2016, she has achieved her goal weight of 160 lbs, even though her personal goal is 145 lbs. Total weight loss of 66 lbs, She is doing well, gained 4 lbs since last visit. She was given Adipex by Dr. Hassell Done, taking it prn now. She states some days she is not able to eat enough protein but she is compliant  with supplements. She has recurrent rash under breast and abdominal folds from the significant weight loss and has to use Nystatin prn and medication helps, but symptoms are severe when she does not use powder.   Hyperlipidemia: she is off Pravastatin since surgery. She needs to have labs done   HTN: off medication since bariatric surgery, no chest pain, no palpitation, bp has been at goal   Insomnia: taking medication and is tolerating it well, denies side effects, only sleeps about 5 hours on medication - because she needs to get up early ( she has a son with special needs and does not have enough hours to sleep during the week days ), she denies naps during the day. She sleeps about 8 hours on weekends, because she does not have to get up early . She goes to bed late on the weekdays, between 11:30 and midnight. She denies grogginess during the day. We decreased dose to 10 mg but she is unable to go down any further because can't sleep without Ambien She is aware of FDA guidelines, and is willing to continue medication as is.   DMII with peripheral neuropathy and nephropathy:  off metformin since surgery - bariatric surgery in 10/2014 , no polyphagia, but has episodes of polydipsia but no  polyuria. On Gabapentin seems to help with  neuropathy, she is not taking while at work because it makes her sleepy at times. She is back on Losartan , not for bp but for 100 microalbuminuria  Major depression: taking Fluoxetine, sometimes she takes twice daily because of stress at work, but explained that it does not work that way. Explained that we can increase to 20mg  daily, she prefers taking it twice daily, because taking 20 mg at once makes her feel jittery. No side effects. Denies crying spells, or anhedonia, just worries constantly about her son that has special needs - lack of sleep.   Migraines: she has tried multiple medications, but could not tolerate, she take Ibuprofen prn, episodes about 2 times motnh. Pain is behind her eyes, throbbing like, associated with nausea at times, photophobia. It can last up to 4 hours with medication ( Tylenol ) occasionally taking Ibuprofen  She is aware that she should not be taking nsaid's since bariatric surgery because of increase chance of ulcer formation and GI bleed. Explained that will not refill it for her, she can ask her bariatric surgeon for a prescription if needed. Offered adding baclofen or Nortriptyline but she refuses today  Raynaud's: she states that over the past I months she has noticed that fingers on both hands gets white , painful and numb, not sure of the triggers at this time.  Cold is make it much worse, she is now on Norvasc, episodes not as frequent but just as painful when present  Eczema: she needs a refill of triamcinolone for rash on arms, it is itchy   Patient Active Problem List   Diagnosis Date Noted  . Controlled type 2 diabetes mellitus with neuropathy ([place]) 05/12/2016  . Raynaud's phenomenon 05/12/2016  . Intertrigo 09/06/2015  . Migraine headache without aura 02/28/2015  . Obesity (BMI 30-39.9) 02/28/2015  . Hyperlipidemia 02/28/2015  . GERD (gastroesophageal reflux disease) 02/28/2015  . Vitamin D deficiency 02/28/2015  . Chronic constipation  02/28/2015  . Chronic insomnia 02/28/2015  . Peripheral neuropathy ([place]) 02/28/2015  . Vitiligo 02/28/2015  . Lower leg mass 02/28/2015  . Depression with anxiety 02/28/2015  . History of bariatric surgery 11/05/2014    Past Surgical History:  Procedure Laterality Date  . ABDOMINAL HYSTERECTOMY    . APPENDECTOMY    . BREAST SURGERY    . BREATH TEK H PYLORI N/A 08/27/2014   Procedure: BREATH TEK H PYLORI;  Surgeon: Pedro Earls, MD;  Location: [facility];  Service: General;  Laterality: N/A;  . HIATAL HERNIA REPAIR  11/05/2014   Procedure: LAPAROSCOPIC REPAIR OF HIATAL HERNIA;  Surgeon: Johnathan Hausen, MD;  Location: [facility];  Service: General;;  . LAPAROSCOPIC GASTRIC SLEEVE RESECTION N/A 11/05/2014   Procedure: LAPAROSCOPIC GASTRIC SLEEVE RESECTION;  Surgeon: Johnathan Hausen, MD;  Location: [facility];  Service: General;  Laterality: N/A;  . REDUCTION MAMMAPLASTY Bilateral 1997  . UPPER GI ENDOSCOPY  11/05/2014   Procedure: UPPER GI ENDOSCOPY;  Surgeon: Johnathan Hausen, MD;  Location: [facility];  Service: General;;    Family History  Problem Relation Age of Onset  . Diabetes Paternal Uncle   . Stroke Father   . Hypertension Other   . Breast cancer Maternal Grandmother 26    Social History   Social History  . Marital status: Single    Spouse name: N/A  . Number of children: N/A  . Years of education: N/A   Occupational History  . Not on file.   Social History Main Topics  . Smoking status: Never Smoker  . Smokeless tobacco: Never Used  . Alcohol use No     Comment: occasional   . Drug use: No  . Sexual activity: Yes   Other Topics Concern  . Not on file   Social History Narrative  . No narrative on file     Current Outpatient Prescriptions:  .  amLODipine (NORVASC) 5 MG tablet, Take 1 tablet (5 mg total) by mouth daily., Disp: 90 tablet, Rfl: 3 .  Calcium Carb-Cholecalciferol (CALCIUM-VITAMIN D3) 500-400 MG-UNIT TABS, Take 1 tablet by mouth 3 (three) times daily.,  Disp: , Rfl:  .  cyanocobalamin 500 MCG tablet, Take 1 mcg by mouth every morning., Disp: , Rfl:  .  ferrous gluconate ([employer] IRON) 240 (27 FE) MG tablet, Take 1 tablet (240 mg total) by mouth 3 (three) times daily with meals., Disp: 30 tablet, Rfl: 0 .  FLUoxetine (PROZAC) 10 MG capsule, Take 1 capsule (10 mg total) by mouth daily., Disp: 180 capsule, Rfl: 1 .  fluticasone (FLONASE) 50 MCG/ACT nasal spray, Place 2 sprays into both nostrils daily., Disp: 16 g, Rfl: 6 .  gabapentin (NEURONTIN) 600 MG tablet, Take 1 tablet (600 mg total) by mouth 3 (three) times daily., Disp: 270 tablet, Rfl: 1 .  losartan (COZAAR) 50 MG tablet, Take 1 tablet (50 mg total) by mouth daily., Disp:  90 tablet, Rfl: 1 .  Multiple Vitamin (MULTIVITAMIN WITH MINERALS) TABS tablet, Take 1 tablet by mouth 2 (two) times daily., Disp: , Rfl:  .  NYSTATIN, TOPICAL, POWD, Apply 1 each topically 2 (two) times daily as needed., Disp: 60 g, Rfl: 5 .  ondansetron (ZOFRAN ODT) 4 MG disintegrating tablet, Take 1 tablet (4 mg total) by mouth every 8 (eight) hours as needed for nausea or vomiting., Disp: 20 tablet, Rfl: 0 .  phentermine (ADIPEX-P) 37.5 MG tablet, Take 1 tablet by mouth daily., Disp: , Rfl: 3 .  polyethylene glycol powder (GLYCOLAX/MIRALAX) powder, Take 17 g by mouth 2 (two) times daily as needed., Disp: 3350 g, Rfl: 1 .  triamcinolone cream (KENALOG) 0.1 %, Apply 1 application topically 2 (two) times daily., Disp: 456.6 g, Rfl: 0 .  zolpidem (AMBIEN) 10 MG tablet, Take 1 tablet (10 mg total) by mouth at bedtime., Disp: 90 tablet, Rfl: 0  No Known Allergies   ROS  Constitutional: Negative for fever or significant weight change.  Respiratory: Negative for cough and shortness of breath.   Cardiovascular: Negative for chest pain or palpitations.  Gastrointestinal: Negative for abdominal pain, no bowel changes.  Musculoskeletal: Negative for gait problem or joint swelling.  Skin: Positive  for rash.  Neurological:  Negative for dizziness, positive for intermittent headache.  No other specific complaints in a complete review of systems (except as listed in HPI above).  Objective  Vitals:   09/15/16 1320  BP: 122/76  Pulse: 84  Resp: 16  Temp: 98.3 F (36.8 C)  SpO2: 96%  Weight: 161 lb (73 kg)    Body mass index is 28.52 kg/m.  Physical Exam  Constitutional: Patient appears well-developed and well-nourished. Obese  No distress.  HEENT: head atraumatic, normocephalic, pupils equal and reactive to light, neck supple, throat within normal limits Cardiovascular: Normal rate, regular rhythm and normal heart sounds.  No murmur heard. No BLE edema. Pulmonary/Chest: Effort normal and breath sounds normal. No respiratory distress. Abdominal: Soft.  There is no tenderness. Psychiatric: Patient has a normal mood and affect. behavior is normal. Judgment and thought content normal. Skin: hyperpigmented patches on ventral aspect of both arms  Recent Results (from the past 2160 hour(s))  POCT HgB A1C     Status: Normal   Collection Time: 09/15/16  1:36 PM  Result Value Ref Range   Hemoglobin A1C 5.5      PHQ2/9: Depression screen [facility] 2/9 05/12/2016 09/19/2015 09/06/2015 07/04/2015 05/02/2015  Decreased Interest 0 0 0 0 0  Down, Depressed, Hopeless 0 0 0 0 0  PHQ - 2 Score 0 0 0 0 0     Fall Risk: Fall Risk  05/12/2016 09/19/2015 09/06/2015 07/04/2015 05/02/2015  Falls in the past year? No No No No No     Assessment & Plan  1. Controlled type 2 diabetes mellitus with neuropathy ([place])  Well controlled - POCT HgB A1C  2. History of bariatric surgery  She needs to have labs done  3. Mixed hyperlipidemia  Not on medication since weight loss  4. Major depression, chronic  Stable, on SSRI  5. Chronic insomnia  - zolpidem (AMBIEN) 10 MG tablet; Take 1 tablet (10 mg total) by mouth at bedtime.  Dispense: 90 tablet; Refill: 0  6. Chronic constipation  - polyethylene glycol powder  (GLYCOLAX/MIRALAX) powder; Take 17 g by mouth 2 (two) times daily as needed.  Dispense: 3350 g; Refill: 1  7. Migraine without aura and without status migrainosus,  not intractable  She can only take Tylenol and Zofran, triptans causes a lot of side effects  8. Raynaud's phenomenon without gangrene  Less frequent since started on Norvasc , but when present still has a lot of pain, discussed referral to rheumatologist  -referral rheumatologist   9. Intractable migraine without aura and without status migrainosus  - ondansetron (ZOFRAN ODT) 4 MG disintegrating tablet; Take 1 tablet (4 mg total) by mouth every 8 (eight) hours as needed for nausea or vomiting.  Dispense: 20 tablet; Refill: 0  10. Type 2 diabetes with nephropathy (HCC)  On ARB  11. Peripheral polyneuropathy (HCC)  Taking Gabapentin

## 2016-09-21 ENCOUNTER — Encounter: Payer: Self-pay | Admitting: Physician Assistant

## 2016-09-21 ENCOUNTER — Ambulatory Visit: Payer: Self-pay | Admitting: Physician Assistant

## 2016-09-21 VITALS — BP 118/80 | HR 76 | Temp 98.5°F

## 2016-09-21 DIAGNOSIS — J0100 Acute maxillary sinusitis, unspecified: Secondary | ICD-10-CM

## 2016-09-21 MED ORDER — PREDNISONE 10 MG PO TABS: 30.0000 mg | ORAL_TABLET | Freq: Every day | ORAL | 0 refills | Status: DC

## 2016-09-21 MED ORDER — FLUCONAZOLE 150 MG PO TABS: ORAL_TABLET | ORAL | 0 refills | Status: DC

## 2016-09-21 MED ORDER — AMOXICILLIN 875 MG PO TABS: 875.0000 mg | ORAL_TABLET | Freq: Two times a day (BID) | ORAL | 0 refills | Status: DC

## 2016-09-21 NOTE — Progress Notes (Signed)
S: C/o runny nose and congestion for 1 month, felt a little better but last week she began to have swelling and pain on the left side of her nose and face, no fever, chills, cp/sob, v/d; mucus is green and thick, was using flonase but it burns so she didn't use it this weekend Using otc meds:   O: PE: vitals wnl, nad, perrl eomi, normocephalic, tms dull, nasal mucosa red and swollen, throat injected, neck supple no lymph, lungs c t a, cv rrr, neuro intact  A:  Acute sinusitis   P: drink fluids, continue regular meds , use otc meds of choice, return if not improving in 5 days, return earlier if worsening , amoxil, pred 30mg  qd x 3d, diflucan 1 now and 1 in a week

## 2016-09-22 ENCOUNTER — Encounter: Payer: Self-pay | Admitting: Family Medicine

## 2016-10-08 DIAGNOSIS — M793 Panniculitis, unspecified: Secondary | ICD-10-CM | POA: Diagnosis not present

## 2016-10-08 DIAGNOSIS — Z9884 Bariatric surgery status: Secondary | ICD-10-CM | POA: Diagnosis not present

## 2016-10-13 ENCOUNTER — Ambulatory Visit: Payer: 59 | Admitting: Family Medicine

## 2016-10-15 DIAGNOSIS — H524 Presbyopia: Secondary | ICD-10-CM | POA: Diagnosis not present

## 2016-10-26 ENCOUNTER — Encounter: Payer: Self-pay | Admitting: Family Medicine

## 2016-10-26 ENCOUNTER — Other Ambulatory Visit: Payer: Self-pay | Admitting: Family Medicine

## 2016-10-26 MED ORDER — SCOPOLAMINE 1 MG/3DAYS TD PT72: 1.0000 | MEDICATED_PATCH | TRANSDERMAL | 0 refills | Status: DC

## 2016-12-08 ENCOUNTER — Ambulatory Visit (INDEPENDENT_AMBULATORY_CARE_PROVIDER_SITE_OTHER): Payer: 59 | Admitting: Family Medicine

## 2016-12-08 ENCOUNTER — Encounter: Payer: Self-pay | Admitting: Family Medicine

## 2016-12-08 ENCOUNTER — Other Ambulatory Visit
Admission: RE | Admit: 2016-12-08 | Discharge: 2016-12-08 | Disposition: A | Discharge status: Home / Self Care | Payer: 59 | Source: Ambulatory Visit | Attending: Family Medicine | Admitting: Family Medicine

## 2016-12-08 VITALS — HR 84 | Temp 97.9°F | Resp 16 | Ht 63.0 in | Wt 162.6 lb

## 2016-12-08 DIAGNOSIS — E785 Hyperlipidemia, unspecified: Secondary | ICD-10-CM | POA: Diagnosis not present

## 2016-12-08 DIAGNOSIS — E1140 Type 2 diabetes mellitus with diabetic neuropathy, unspecified: Secondary | ICD-10-CM | POA: Diagnosis not present

## 2016-12-08 DIAGNOSIS — K5909 Other constipation: Secondary | ICD-10-CM | POA: Diagnosis not present

## 2016-12-08 DIAGNOSIS — E782 Mixed hyperlipidemia: Secondary | ICD-10-CM | POA: Diagnosis not present

## 2016-12-08 DIAGNOSIS — Z9884 Bariatric surgery status: Secondary | ICD-10-CM | POA: Insufficient documentation

## 2016-12-08 DIAGNOSIS — I7300 Raynaud's syndrome without gangrene: Secondary | ICD-10-CM | POA: Diagnosis not present

## 2016-12-08 DIAGNOSIS — F341 Dysthymic disorder: Secondary | ICD-10-CM

## 2016-12-08 DIAGNOSIS — G43009 Migraine without aura, not intractable, without status migrainosus: Secondary | ICD-10-CM | POA: Diagnosis not present

## 2016-12-08 DIAGNOSIS — F5104 Psychophysiologic insomnia: Secondary | ICD-10-CM | POA: Diagnosis not present

## 2016-12-08 DIAGNOSIS — E559 Vitamin D deficiency, unspecified: Secondary | ICD-10-CM | POA: Diagnosis not present

## 2016-12-08 DIAGNOSIS — F329 Major depressive disorder, single episode, unspecified: Secondary | ICD-10-CM

## 2016-12-08 DIAGNOSIS — E1121 Type 2 diabetes mellitus with diabetic nephropathy: Secondary | ICD-10-CM

## 2016-12-08 LAB — CBC WITH DIFFERENTIAL/PLATELET
BASOS PCT: 1 %
Basophils Absolute: 0.1 10*3/uL (ref 0–0.1)
EOS ABS: 0.1 10*3/uL (ref 0–0.7)
Eosinophils Relative: 1 %
HCT: 41.7 % (ref 35.0–47.0)
Hemoglobin: 13 g/dL (ref 12.0–16.0)
Lymphocytes Relative: 34 %
Lymphs Abs: 2.7 10*3/uL (ref 1.0–3.6)
MCH: 24.8 pg — ABNORMAL LOW (ref 26.0–34.0)
MCHC: 31.2 g/dL — AB (ref 32.0–36.0)
MCV: 79.8 fL — ABNORMAL LOW (ref 80.0–100.0)
MONO ABS: 0.5 10*3/uL (ref 0.2–0.9)
MONOS PCT: 7 %
Neutro Abs: 4.5 10*3/uL (ref 1.4–6.5)
Neutrophils Relative %: 57 %
Platelets: 346 10*3/uL (ref 150–440)
RBC: 5.23 MIL/uL — ABNORMAL HIGH (ref 3.80–5.20)
RDW: 15.3 % — AB (ref 11.5–14.5)
WBC: 7.9 10*3/uL (ref 3.6–11.0)

## 2016-12-08 LAB — LIPID PANEL
Cholesterol: 175 mg/dL (ref 0–200)
HDL: 45 mg/dL (ref >40)
LDL CALC: 121 mg/dL — AB (ref 0–99)
Total CHOL/HDL Ratio: 3.9 RATIO
Triglycerides: 43 mg/dL (ref <150)
VLDL: 9 mg/dL (ref 0–40)

## 2016-12-08 LAB — COMPREHENSIVE METABOLIC PANEL
ALBUMIN: 4 g/dL (ref 3.5–5.0)
ALT: 13 U/L — ABNORMAL LOW (ref 14–54)
ANION GAP: 7 (ref 5–15)
AST: 17 U/L (ref 15–41)
Alkaline Phosphatase: 80 U/L (ref 38–126)
BILIRUBIN TOTAL: 0.5 mg/dL (ref 0.3–1.2)
BUN: 17 mg/dL (ref 6–20)
CO2: 26 mmol/L (ref 22–32)
Calcium: 8.5 mg/dL — ABNORMAL LOW (ref 8.9–10.3)
Chloride: 104 mmol/L (ref 101–111)
Creatinine, Ser: 0.7 mg/dL (ref 0.44–1.00)
GFR calc Af Amer: >60 mL/min (ref >60)
GFR calc non Af Amer: >60 mL/min (ref >60)
GLUCOSE: 103 mg/dL — AB (ref 65–99)
POTASSIUM: 3.5 mmol/L (ref 3.5–5.1)
SODIUM: 137 mmol/L (ref 135–145)
TOTAL PROTEIN: 7.1 g/dL (ref 6.5–8.1)

## 2016-12-08 LAB — VITAMIN B12: Vitamin B-12: 1049 pg/mL — ABNORMAL HIGH (ref 180–914)

## 2016-12-08 MED ORDER — FLUOXETINE HCL 10 MG PO CAPS: 10.0000 mg | ORAL_CAPSULE | Freq: Every day | ORAL | 1 refills | Status: DC

## 2016-12-08 MED ORDER — ONDANSETRON 4 MG PO TBDP: 4.0000 mg | ORAL_TABLET | Freq: Three times a day (TID) | ORAL | 0 refills | Status: DC | PRN

## 2016-12-08 MED ORDER — ZOLPIDEM TARTRATE 10 MG PO TABS: 10.0000 mg | ORAL_TABLET | Freq: Every day | ORAL | 0 refills | Status: DC

## 2016-12-08 MED ORDER — LOSARTAN POTASSIUM 50 MG PO TABS: 50.0000 mg | ORAL_TABLET | Freq: Every day | ORAL | 1 refills | Status: DC

## 2016-12-08 NOTE — Progress Notes (Signed)
Name: Shannon Soto   MRN: [id number]    DOB: 08/24/1971   Date:12/08/2016       Progress Note  Subjective  Chief Complaint  Chief Complaint  Patient presents with  . Medication Refill    3 month F/U  . Diabetes    Does not check sugar at home  . Hypertension  . Hyperlipidemia  . Allergic Rhinitis     Headaches, itchy eyes and nose.    HPI  Bariatric Surgery: she had sleeve surgery done by Dr. Daphine Deutscher on March 14th, 2016, she has achieved her goal weight of 160 lbs, even though her personal goal is 145 lbs. Total weight loss of 66 lbs,She is doing well, gained 4 lbs since last visit. She was given Adipex by Dr. Daphine Deutscher, taking it prn now. She states some days she is not able to eat enough protein but she is compliant  with supplements. She has recurrent rash under breast and abdominal folds from the significant weight loss and has to use Nystatin prn and medication helps, but symptoms are severe when she does not use powder. Weight has bee stable  Hyperlipidemia: she is off Pravastatin since surgery. LDL is okay , had bariatric surgery   HTN: off medication since bariatric surgery, no chest pain, no palpitation, bp has been at goal   Insomnia: taking medication and is tolerating it well, denies side effects, only sleeps about 5 hours on medication - because she needs to get up early ( she has a son with special needs and does not have enough hours to sleep during the week days ), she denies naps during the day. She sleeps about 8 hours on weekends, because she does not have to get up early . She goes to bed late on the weekdays, between 11:30 and midnight. She denies grogginess during the day. We decreased dose to 10 mg but she is unable to go down any further because can't sleep without Ambien She is aware of FDA guidelines, and is willing to continue medication as is.   DMII with peripheral neuropathy and nephropathy: off metformin since surgery - bariatric surgery in 10/2014 ,  no polyphagia, but has episodes of polydipsia but no  polyuria. On Gabapentin seems to help with neuropathy, she is not taking while at work because it makes her sleepy at times. She is back on Losartan , not for bp but for 100 microalbuminuria, we will recheck it today. Last hgba1C 5.5%  Major depression: taking Fluoxetine, one daily, occasionally takes one extra pill during the day.. No side effects. Denies crying spells, or anhedonia, just worries constantly about her son that has special needs - lack of sleep.   Migraines: she has tried multiple medications, but could not tolerate, she take Ibuprofen prn, episodes about 2 times motnh. Pain is behind her eyes, throbbing like, associated with nausea at times, photophobia. It can last up to 4 hours with medication ( Tylenol )she stopped taking Goody powders and ibuprofen.  Raynaud's: she states that over the past  months she has noticed that fingers on both hands gets white , painful and numb, not sure of the triggers at this time. Cold is make it much worse, she is now on Norvasc, episodes not as frequent but just as painful when present, referral placed for Rheumatologist    Patient Active Problem List   Diagnosis Date Noted  . Controlled type 2 diabetes mellitus with neuropathy (HCC) 05/12/2016  . Raynaud's phenomenon 05/12/2016  .  Intertrigo 09/06/2015  . Migraine headache without aura 02/28/2015  . Obesity (BMI 30-39.9) 02/28/2015  . Hyperlipidemia 02/28/2015  . GERD (gastroesophageal reflux disease) 02/28/2015  . Vitamin D deficiency 02/28/2015  . Chronic constipation 02/28/2015  . Chronic insomnia 02/28/2015  . Peripheral neuropathy 02/28/2015  . Vitiligo 02/28/2015  . Lower leg mass 02/28/2015  . Depression with anxiety 02/28/2015  . History of bariatric surgery 11/05/2014    Past Surgical History:  Procedure Laterality Date  . ABDOMINAL HYSTERECTOMY    . APPENDECTOMY    . BREAST SURGERY    . BREATH TEK H PYLORI N/A  08/27/2014   Procedure: BREATH TEK H PYLORI;  Surgeon: Pedro Earls, MD;  Location: [facility];  Service: General;  Laterality: N/A;  . HIATAL HERNIA REPAIR  11/05/2014   Procedure: LAPAROSCOPIC REPAIR OF HIATAL HERNIA;  Surgeon: Johnathan Hausen, MD;  Location: [facility];  Service: General;;  . LAPAROSCOPIC GASTRIC SLEEVE RESECTION N/A 11/05/2014   Procedure: LAPAROSCOPIC GASTRIC SLEEVE RESECTION;  Surgeon: Johnathan Hausen, MD;  Location: [facility];  Service: General;  Laterality: N/A;  . REDUCTION MAMMAPLASTY Bilateral 1997  . UPPER GI ENDOSCOPY  11/05/2014   Procedure: UPPER GI ENDOSCOPY;  Surgeon: Johnathan Hausen, MD;  Location: [facility];  Service: General;;    Family History  Problem Relation Age of Onset  . Diabetes Paternal Uncle   . Stroke Father   . Hypertension Other   . Breast cancer Maternal Grandmother 60    Social History   Social History  . Marital status: Single    Spouse name: N/A  . Number of children: N/A  . Years of education: N/A   Occupational History  . Not on file.   Social History Main Topics  . Smoking status: Never Smoker  . Smokeless tobacco: Never Used  . Alcohol use No     Comment: occasional   . Drug use: No  . Sexual activity: Yes   Other Topics Concern  . Not on file   Social History Narrative  . No narrative on file     Current Outpatient Prescriptions:  .  amLODipine (NORVASC) 5 MG tablet, Take 1 tablet (5 mg total) by mouth daily., Disp: 90 tablet, Rfl: 3 .  Calcium Carb-Cholecalciferol (CALCIUM-VITAMIN D3) 500-400 MG-UNIT TABS, Take 1 tablet by mouth 3 (three) times daily., Disp: , Rfl:  .  cyanocobalamin 500 MCG tablet, Take 1 mcg by mouth every morning., Disp: , Rfl:  .  ferrous gluconate ([employer] IRON) 240 (27 FE) MG tablet, Take 1 tablet (240 mg total) by mouth 3 (three) times daily with meals., Disp: 30 tablet, Rfl: 0 .  FLUoxetine (PROZAC) 10 MG capsule, Take 1 capsule (10 mg total) by mouth daily., Disp: 90 capsule, Rfl: 1 .  gabapentin  (NEURONTIN) 600 MG tablet, Take 1 tablet (600 mg total) by mouth 3 (three) times daily., Disp: 270 tablet, Rfl: 1 .  Multiple Vitamin (MULTIVITAMIN WITH MINERALS) TABS tablet, Take 1 tablet by mouth 2 (two) times daily., Disp: , Rfl:  .  NYSTATIN, TOPICAL, POWD, Apply 1 each topically 2 (two) times daily as needed., Disp: 60 g, Rfl: 5 .  ondansetron (ZOFRAN ODT) 4 MG disintegrating tablet, Take 1 tablet (4 mg total) by mouth every 8 (eight) hours as needed for nausea or vomiting., Disp: 20 tablet, Rfl: 0 .  polyethylene glycol powder (GLYCOLAX/MIRALAX) powder, Take 17 g by mouth 2 (two) times daily as needed., Disp: 3350 g, Rfl: 1 .  triamcinolone cream (KENALOG) 0.1 %, ,  Disp: , Rfl: 0 .  zolpidem (AMBIEN) 10 MG tablet, Take 1 tablet (10 mg total) by mouth at bedtime., Disp: 90 tablet, Rfl: 0 .  losartan (COZAAR) 50 MG tablet, Take 1 tablet (50 mg total) by mouth daily., Disp: 90 tablet, Rfl: 1  No Known Allergies   ROS  Constitutional: Negative for fever or weight change.  Respiratory: Negative for cough and shortness of breath.   Cardiovascular: Negative for chest pain or palpitations.  Gastrointestinal: Negative for abdominal pain, no bowel changes.  Musculoskeletal: Negative for gait problem or joint swelling.  Skin: Negative for rash.  Neurological: Negative for dizziness , positive for intermittent  headache.  No other specific complaints in a complete review of systems (except as listed in HPI above).  Objective  Vitals:   12/08/16 1320  Pulse: 84  Resp: 16  Temp: 97.9 F (36.6 C)  TempSrc: Oral  SpO2: 98%  Weight: 162 lb 9.6 oz (73.8 kg)  Height: '5\' 3"'$  (1.6 m)    Body mass index is 28.8 kg/m.  Physical Exam  Constitutional: Patient appears well-developed and well-nourished. Obese  No distress.  HEENT: head atraumatic, normocephalic, pupils equal and reactive to light, neck supple, throat within normal limits Cardiovascular: Normal rate, regular rhythm and normal  heart sounds.  No murmur heard. No BLE edema. Pulmonary/Chest: Effort normal and breath sounds normal. No respiratory distress. Abdominal: Soft.  There is no tenderness. Psychiatric: Patient has a normal mood and affect. behavior is normal. Judgment and thought content normal.  Recent Results (from the past 2160 hour(s))  POCT HgB A1C     Status: Normal   Collection Time: 09/15/16  1:36 PM  Result Value Ref Range   Hemoglobin A1C 5.5   CBC with Differential/Platelet     Status: Abnormal   Collection Time: 12/08/16  7:37 AM  Result Value Ref Range   WBC 7.9 3.6 - 11.0 K/uL   RBC 5.23 (H) 3.80 - 5.20 MIL/uL   Hemoglobin 13.0 12.0 - 16.0 g/dL   HCT 41.7 35.0 - 47.0 %   MCV 79.8 (L) 80.0 - 100.0 fL   MCH 24.8 (L) 26.0 - 34.0 pg   MCHC 31.2 (L) 32.0 - 36.0 g/dL   RDW 15.3 (H) 11.5 - 14.5 %   Platelets 346 150 - 440 K/uL   Neutrophils Relative % 57 %   Neutro Abs 4.5 1.4 - 6.5 K/uL   Lymphocytes Relative 34 %   Lymphs Abs 2.7 1.0 - 3.6 K/uL   Monocytes Relative 7 %   Monocytes Absolute 0.5 0.2 - 0.9 K/uL   Eosinophils Relative 1 %   Eosinophils Absolute 0.1 0 - 0.7 K/uL   Basophils Relative 1 %   Basophils Absolute 0.1 0 - 0.1 K/uL  Comprehensive metabolic panel     Status: Abnormal   Collection Time: 12/08/16  7:37 AM  Result Value Ref Range   Sodium 137 135 - 145 mmol/L   Potassium 3.5 3.5 - 5.1 mmol/L   Chloride 104 101 - 111 mmol/L   CO2 26 22 - 32 mmol/L   Glucose, Bld 103 (H) 65 - 99 mg/dL   BUN 17 6 - 20 mg/dL   Creatinine, Ser 0.70 0.44 - 1.00 mg/dL   Calcium 8.5 (L) 8.9 - 10.3 mg/dL   Total Protein 7.1 6.5 - 8.1 g/dL   Albumin 4.0 3.5 - 5.0 g/dL   AST 17 15 - 41 U/L   ALT 13 (L) 14 - 54 U/L  Alkaline Phosphatase 80 38 - 126 U/L   Total Bilirubin 0.5 0.3 - 1.2 mg/dL   GFR calc non Af Amer >60 >60 mL/min   GFR calc Af Amer >60 >60 mL/min    Comment: (NOTE) The eGFR has been calculated using the CKD EPI equation. This calculation has not been validated in all  clinical situations. eGFR's persistently <60 mL/min signify possible Chronic Kidney Disease.    Anion gap 7 5 - 15  Vitamin B12     Status: Abnormal   Collection Time: 12/08/16  7:37 AM  Result Value Ref Range   Vitamin B-12 1,049 (H) 180 - 914 pg/mL    Comment: (NOTE) This assay is not validated for testing neonatal or myeloproliferative syndrome specimens for Vitamin B12 levels. Performed at [facility], [place] [address], [place], [place] [address]   Lipid panel     Status: Abnormal   Collection Time: 12/08/16  7:37 AM  Result Value Ref Range   Cholesterol 175 0 - 200 mg/dL   Triglycerides 43 <150 mg/dL   HDL 45 >40 mg/dL   Total CHOL/HDL Ratio 3.9 RATIO   VLDL 9 0 - 40 mg/dL   LDL Cholesterol 121 (H) 0 - 99 mg/dL    Comment:        Total Cholesterol/HDL:CHD Risk Coronary Heart Disease Risk Table                     Men   Women  1/2 Average Risk   3.4   3.3  Average Risk       5.0   4.4  2 X Average Risk   9.6   7.1  3 X Average Risk  23.4   11.0        Use the calculated Patient Ratio above and the CHD Risk Table to determine the patient's CHD Risk.        ATP III CLASSIFICATION (LDL):  <100     mg/dL   Optimal  100-129  mg/dL   Near or Above                    Optimal  130-159  mg/dL   Borderline  160-189  mg/dL   High  >190     mg/dL   Very High       PHQ2/9: Depression screen [facility] 2/9 12/08/2016 05/12/2016 09/19/2015 09/06/2015 07/04/2015  Decreased Interest 1 0 0 0 0  Down, Depressed, Hopeless 0 0 0 0 0  PHQ - 2 Score 1 0 0 0 0     Fall Risk: Fall Risk  12/08/2016 05/12/2016 09/19/2015 09/06/2015 07/04/2015  Falls in the past year? No No No No No    Functional Status Survey: Is the patient deaf or have difficulty hearing?: No Does the patient have difficulty seeing, even when wearing glasses/contacts?: No Does the patient have difficulty concentrating, remembering, or making decisions?: No Does the patient have difficulty walking or climbing  stairs?: No Does the patient have difficulty dressing or bathing?: No Does the patient have difficulty doing errands alone such as visiting a doctor's office or shopping?: No    Assessment & Plan  1. Controlled type 2 diabetes mellitus with neuropathy ([place])  She continues to have burning on both legs, but stable with gabapentin   2. Mixed hyperlipidemia  Discussed lipid studies with her today, improving  3. History of bariatric surgery  Low MCV , continue ferrous sulfate and we will recheck  with ferritin if remains low  4. Major depression, chronic  - FLUoxetine (PROZAC) 10 MG capsule; Take 1 capsule (10 mg total) by mouth daily.  Dispense: 90 capsule; Refill: 1  5. Chronic insomnia  - zolpidem (AMBIEN) 10 MG tablet; Take 1 tablet (10 mg total) by mouth at bedtime.  Dispense: 90 tablet; Refill: 0  6. Chronic constipation  She only takes prn, because otherwise she has diarrhea, she does not want to change medication at this time  7. Raynaud's phenomenon without gangrene  - Ambulatory referral to Rheumatology  8. Migraine without aura and without status migrainosus, not intractable  - ondansetron (ZOFRAN ODT) 4 MG disintegrating tablet; Take 1 tablet (4 mg total) by mouth every 8 (eight) hours as needed for nausea or vomiting.  Dispense: 20 tablet; Refill: 0  9. Type 2 diabetes with nephropathy (HCC)  Taking losartan - losartan (COZAAR) 50 MG tablet; Take 1 tablet (50 mg total) by mouth daily.  Dispense: 90 tablet; Refill: 1 - Urine Microalbumin w/creat. ratio

## 2016-12-09 LAB — VITAMIN D 25 HYDROXY (VIT D DEFICIENCY, FRACTURES): VIT D 25 HYDROXY: 25.7 ng/mL — AB (ref 30.0–100.0)

## 2016-12-09 LAB — MICROALBUMIN / CREATININE URINE RATIO
Creatinine, Urine: 233 mg/dL (ref 20–320)
MICROALB/CREAT RATIO: 3 ug/mg{creat} (ref <30)
Microalb, Ur: 0.8 mg/dL

## 2016-12-10 LAB — VITAMIN B1: Vitamin B1 (Thiamine): 106.4 nmol/L (ref 66.5–200.0)

## 2016-12-15 ENCOUNTER — Ambulatory Visit: Payer: 59 | Admitting: Family Medicine

## 2016-12-17 ENCOUNTER — Other Ambulatory Visit: Payer: Self-pay | Admitting: Family Medicine

## 2016-12-17 DIAGNOSIS — Z1231 Encounter for screening mammogram for malignant neoplasm of breast: Secondary | ICD-10-CM

## 2016-12-24 DIAGNOSIS — I7300 Raynaud's syndrome without gangrene: Secondary | ICD-10-CM | POA: Diagnosis not present

## 2016-12-24 DIAGNOSIS — G609 Hereditary and idiopathic neuropathy, unspecified: Secondary | ICD-10-CM | POA: Diagnosis not present

## 2017-01-14 ENCOUNTER — Ambulatory Visit
Admission: RE | Admit: 2017-01-14 | Discharge: 2017-01-14 | Disposition: A | Discharge status: Home / Self Care | Payer: 59 | Source: Ambulatory Visit | Attending: Family Medicine | Admitting: Family Medicine

## 2017-01-14 DIAGNOSIS — Z1231 Encounter for screening mammogram for malignant neoplasm of breast: Secondary | ICD-10-CM | POA: Insufficient documentation

## 2017-03-16 ENCOUNTER — Ambulatory Visit (INDEPENDENT_AMBULATORY_CARE_PROVIDER_SITE_OTHER): Payer: 59 | Admitting: Family Medicine

## 2017-03-16 ENCOUNTER — Encounter: Payer: Self-pay | Admitting: Family Medicine

## 2017-03-16 VITALS — BP 120/78 | HR 80 | Temp 98.5°F | Resp 16 | Ht 63.0 in | Wt 168.2 lb

## 2017-03-16 DIAGNOSIS — E1140 Type 2 diabetes mellitus with diabetic neuropathy, unspecified: Secondary | ICD-10-CM | POA: Diagnosis not present

## 2017-03-16 DIAGNOSIS — F5104 Psychophysiologic insomnia: Secondary | ICD-10-CM

## 2017-03-16 DIAGNOSIS — K5909 Other constipation: Secondary | ICD-10-CM | POA: Diagnosis not present

## 2017-03-16 DIAGNOSIS — F329 Major depressive disorder, single episode, unspecified: Secondary | ICD-10-CM

## 2017-03-16 DIAGNOSIS — F341 Dysthymic disorder: Secondary | ICD-10-CM | POA: Diagnosis not present

## 2017-03-16 DIAGNOSIS — E782 Mixed hyperlipidemia: Secondary | ICD-10-CM

## 2017-03-16 DIAGNOSIS — Z9884 Bariatric surgery status: Secondary | ICD-10-CM | POA: Diagnosis not present

## 2017-03-16 DIAGNOSIS — G43009 Migraine without aura, not intractable, without status migrainosus: Secondary | ICD-10-CM | POA: Diagnosis not present

## 2017-03-16 LAB — POCT GLYCOSYLATED HEMOGLOBIN (HGB A1C): Hemoglobin A1C: 6.2

## 2017-03-16 MED ORDER — ONDANSETRON 4 MG PO TBDP: 4.0000 mg | ORAL_TABLET | Freq: Three times a day (TID) | ORAL | 0 refills | Status: DC | PRN

## 2017-03-16 MED ORDER — ZOLPIDEM TARTRATE 10 MG PO TABS: 10.0000 mg | ORAL_TABLET | Freq: Every day | ORAL | 0 refills | Status: DC

## 2017-03-16 MED ORDER — SEMAGLUTIDE (1 MG/DOSE) 2 MG/1.5ML ~~LOC~~ SOPN: 1.0000 mg | PEN_INJECTOR | SUBCUTANEOUS | 2 refills | Status: DC

## 2017-03-16 NOTE — Progress Notes (Signed)
Name: Shannon Soto   MRN: [id number]    DOB: Feb 06, 1971   Date:03/16/2017       Progress Note  Subjective  Chief Complaint  Chief Complaint  Patient presents with  . Diabetes    3 month follow up  . Hyperlipidemia  . Obesity  . Migraine    pt stated that she has about 3 per month    HPI  Bariatric Surgery: she had sleeve surgery done by Dr. Hassell Done on March 14th, 2016, she has achieved her goal weight of 160 lbs, even though her personal goal is 145 lbs. Total weight loss of 66 lbs,She is doing well, however she is frustrated for not achieving her personal goal, and states thinks she is gaining some weight and did not want to have her weight checked in our office today She states some days she is not able to eat enough protein but she is compliant with supplements. She has recurrent rash under breast and abdominal folds from the significant weight loss and has to use Nystatin prn and medication helps, but symptoms are severe when she does not use powder.  Hyperlipidemia: she is off Pravastatin since surgery. LDL is okay , had bariatric surgery   HTN: she is on Norvasc for Raynaud's and Losartan for microalbuminuria, bp has been at goal since bariatric surgery   Insomnia: taking medication and is tolerating it well, denies side effects, only sleeps about 3.4 hours on medication , she was sleeping 5 hours on 15 mg of Ambien but not FDA approved- because she needs to get up early ( she has a son with special needs and does not have enough hours to sleep during the week days ), she denies naps during the day. She sleeps about 8 hours on weekends, because she does not have to get up early . She goes to bed late on the weekdays, between 11:30 and midnight. She denies grogginess during the day. We decreased dose to 10 mg but she is unable to go down any further because can't sleep without Ambien She is aware of FDA guidelines, and is willing to continue medication as is.  Discussed changing  to another medication that can work, but she wants to hold off for now  DMII with peripheral neuropathy and nephropathy: off metformin since surgery - bariatric surgery in 10/2014 , no polyphagia, but has episodes of polydipsia but no polyuria. On Gabapentin seems to help with neuropathy, she is not taking while at work because it makes her sleepy at times. She is back on Losartan , not for bp but for 100 microalbuminuria.  Last hgba1C 5.5% but is up to 6.2%, and is not losing weight, discussed insulin resistance  Major depression: taking Fluoxetine, one daily, occasionally takes one extra pill during the day. No side effects. Denies crying spells, or anhedonia, just worries constantly about her son that has special needs - lack of sleep. Also frustrated about her weight . She states her youngest is going to middle school and she struggling with transition  Migraines: she has tried multiple medications, but could not tolerate, she take Ibuprofen prn, episodes about 2-3 times motnh. Pain is behind her eyes, throbbing like, associated with nausea at times, photophobia. It can last up to 4 hours with medication ( Tylenol  )she stopped taking Goody powders and ibuprofen.  Raynaud's: she states that over the past  months she has noticed that fingers on both hands gets white , painful and numb, not sure of  the triggers at this time. Cold is make it much worse, she is now on Norvasc, she was seen by Dr. Meda Coffee, tried topical nitroglycerine without improvement of symptoms so she stopped medication    Patient Active Problem List   Diagnosis Date Noted  . Controlled type 2 diabetes mellitus with neuropathy ([place]) 05/12/2016  . Raynaud's phenomenon 05/12/2016  . Intertrigo 09/06/2015  . Migraine headache without aura 02/28/2015  . Obesity (BMI 30-39.9) 02/28/2015  . Hyperlipidemia 02/28/2015  . GERD (gastroesophageal reflux disease) 02/28/2015  . Vitamin D deficiency 02/28/2015  . Chronic constipation  02/28/2015  . Chronic insomnia 02/28/2015  . Peripheral neuropathy 02/28/2015  . Vitiligo 02/28/2015  . Lower leg mass 02/28/2015  . Depression with anxiety 02/28/2015  . History of bariatric surgery 11/05/2014    Past Surgical History:  Procedure Laterality Date  . ABDOMINAL HYSTERECTOMY    . APPENDECTOMY    . BREAST SURGERY    . BREATH TEK H PYLORI N/A 08/27/2014   Procedure: BREATH TEK H PYLORI;  Surgeon: Pedro Earls, MD;  Location: [facility];  Service: General;  Laterality: N/A;  . HIATAL HERNIA REPAIR  11/05/2014   Procedure: LAPAROSCOPIC REPAIR OF HIATAL HERNIA;  Surgeon: Johnathan Hausen, MD;  Location: [facility];  Service: General;;  . LAPAROSCOPIC GASTRIC SLEEVE RESECTION N/A 11/05/2014   Procedure: LAPAROSCOPIC GASTRIC SLEEVE RESECTION;  Surgeon: Johnathan Hausen, MD;  Location: [facility];  Service: General;  Laterality: N/A;  . REDUCTION MAMMAPLASTY Bilateral 1997  . UPPER GI ENDOSCOPY  11/05/2014   Procedure: UPPER GI ENDOSCOPY;  Surgeon: Johnathan Hausen, MD;  Location: [facility];  Service: General;;    Family History  Problem Relation Age of Onset  . Diabetes Paternal Uncle   . Stroke Father   . Hypertension Other   . Breast cancer Maternal Grandmother 78    Social History   Social History  . Marital status: Single    Spouse name: N/A  . Number of children: N/A  . Years of education: N/A   Occupational History  . Not on file.   Social History Main Topics  . Smoking status: Never Smoker  . Smokeless tobacco: Never Used  . Alcohol use No     Comment: occasional   . Drug use: No  . Sexual activity: Yes   Other Topics Concern  . Not on file   Social History Narrative  . No narrative on file     Current Outpatient Prescriptions:  .  amLODipine (NORVASC) 5 MG tablet, Take 1 tablet (5 mg total) by mouth daily., Disp: 90 tablet, Rfl: 3 .  Calcium Carb-Cholecalciferol (CALCIUM-VITAMIN D3) 500-400 MG-UNIT TABS, Take 1 tablet by mouth 3 (three) times daily., Disp:  , Rfl:  .  cyanocobalamin 500 MCG tablet, Take 1 mcg by mouth every morning., Disp: , Rfl:  .  ferrous gluconate ([employer] IRON) 240 (27 FE) MG tablet, Take 1 tablet (240 mg total) by mouth 3 (three) times daily with meals., Disp: 30 tablet, Rfl: 0 .  FLUoxetine (PROZAC) 10 MG capsule, Take 1 capsule (10 mg total) by mouth daily., Disp: 90 capsule, Rfl: 1 .  gabapentin (NEURONTIN) 600 MG tablet, Take 1 tablet (600 mg total) by mouth 3 (three) times daily., Disp: 270 tablet, Rfl: 1 .  losartan (COZAAR) 50 MG tablet, Take 1 tablet (50 mg total) by mouth daily., Disp: 90 tablet, Rfl: 1 .  Multiple Vitamin (MULTIVITAMIN WITH MINERALS) TABS tablet, Take 1 tablet by mouth 2 (two) times daily., Disp: ,  Rfl:  .  NYSTATIN, TOPICAL, POWD, Apply 1 each topically 2 (two) times daily as needed., Disp: 60 g, Rfl: 5 .  ondansetron (ZOFRAN ODT) 4 MG disintegrating tablet, Take 1 tablet (4 mg total) by mouth every 8 (eight) hours as needed for nausea or vomiting., Disp: 20 tablet, Rfl: 0 .  polyethylene glycol powder (GLYCOLAX/MIRALAX) powder, Take 17 g by mouth 2 (two) times daily as needed., Disp: 3350 g, Rfl: 1 .  Semaglutide (OZEMPIC) 1 MG/DOSE SOPN, Inject 1 mg into the skin once a week., Disp: 3 mL, Rfl: 2 .  triamcinolone cream (KENALOG) 0.1 %, , Disp: , Rfl: 0 .  zolpidem (AMBIEN) 10 MG tablet, Take 1 tablet (10 mg total) by mouth at bedtime., Disp: 90 tablet, Rfl: 0  No Known Allergies   ROS  Constitutional: Negative for fever or significant weight change. [er [atoemt.  Respiratory: Negative for cough and shortness of breath.   Cardiovascular: Negative for chest pain or palpitations.  Gastrointestinal: Negative for abdominal pain, no bowel changes.  Musculoskeletal: Negative for gait problem or joint swelling.  Skin: Negative for rash.  Neurological: Negative for dizziness , positive for intermittent headache.  No other specific complaints in a complete review of systems (except as listed in HPI  above).  Objective  Vitals:   03/16/17 1322  BP: 120/78  Pulse: 80  Resp: 16  Temp: 98.5 F (36.9 C)  TempSrc: Oral  SpO2: 99%  Weight: 168 lb 4 oz (76.3 kg)  Height: 5\' 3"  (1.6 m)    Body mass index is 29.8 kg/m.  Physical Exam  Constitutional: Patient appears well-developed and well-nourished. Obese  No distress.  HEENT: head atraumatic, normocephalic, pupils equal and reactive to light,  neck supple, throat within normal limits Cardiovascular: Normal rate, regular rhythm and normal heart sounds.  No murmur heard. No BLE edema. Pulmonary/Chest: Effort normal and breath sounds normal. No respiratory distress. Abdominal: Soft.  There is no tenderness. Psychiatric: Patient has a normal mood and affect. behavior is normal. Judgment and thought content normal.  Recent Results (from the past 2160 hour(s))  POCT HgB A1C     Status: Abnormal   Collection Time: 03/16/17  1:26 PM  Result Value Ref Range   Hemoglobin A1C 6.2      PHQ2/9: Depression screen [facility] 2/9 12/08/2016 05/12/2016 09/19/2015 09/06/2015 07/04/2015  Decreased Interest 1 0 0 0 0  Down, Depressed, Hopeless 0 0 0 0 0  PHQ - 2 Score 1 0 0 0 0     Fall Risk: Fall Risk  12/08/2016 05/12/2016 09/19/2015 09/06/2015 07/04/2015  Falls in the past year? No No No No No     Assessment & Plan  1. Controlled type 2 diabetes mellitus with neuropathy (HCC)  - POCT HgB A1C Going up, she will adjust her diet, and decrease fruit intake and eat more vegetabls - Semaglutide (OZEMPIC) 1 MG/DOSE SOPN; Inject 1 mg into the skin once a week.  Dispense: 3 mL; Refill: 2 No family history of MEN or thyroid cancer, no previous history of pancreatitis  2. Mixed hyperlipidemia  Continue statin therapy   3. History of bariatric surgery  She refused to have her weight check because she has not been losing weight, she has been eating more fruit and less protein, advised to try egg  Muffins and avoid fruit , take veggies for  snack  4. Major depression, chronic  Taking medication, her weight is bothering her more lately   5. Chronic insomnia  -  zolpidem (AMBIEN) 10 MG tablet; Take 1 tablet (10 mg total) by mouth at bedtime.  Dispense: 90 tablet; Refill: 0  6. Chronic constipation  Stable on Miralax  7. Migraine without aura and without status migrainosus, not intractable  - ondansetron (ZOFRAN ODT) 4 MG disintegrating tablet; Take 1 tablet (4 mg total) by mouth every 8 (eight) hours as needed for nausea or vomiting.  Dispense: 20 tablet; Refill: 0

## 2017-06-22 ENCOUNTER — Encounter: Payer: Self-pay | Admitting: Family Medicine

## 2017-06-22 ENCOUNTER — Ambulatory Visit (INDEPENDENT_AMBULATORY_CARE_PROVIDER_SITE_OTHER): Payer: 59 | Admitting: Family Medicine

## 2017-06-22 VITALS — BP 140/70 | HR 80 | Resp 14 | Ht 63.0 in | Wt 160.3 lb

## 2017-06-22 DIAGNOSIS — K2100 Gastro-esophageal reflux disease with esophagitis, without bleeding: Secondary | ICD-10-CM

## 2017-06-22 DIAGNOSIS — F341 Dysthymic disorder: Secondary | ICD-10-CM

## 2017-06-22 DIAGNOSIS — E1140 Type 2 diabetes mellitus with diabetic neuropathy, unspecified: Secondary | ICD-10-CM

## 2017-06-22 DIAGNOSIS — L304 Erythema intertrigo: Secondary | ICD-10-CM | POA: Diagnosis not present

## 2017-06-22 DIAGNOSIS — E1121 Type 2 diabetes mellitus with diabetic nephropathy: Secondary | ICD-10-CM

## 2017-06-22 DIAGNOSIS — F5104 Psychophysiologic insomnia: Secondary | ICD-10-CM

## 2017-06-22 DIAGNOSIS — E782 Mixed hyperlipidemia: Secondary | ICD-10-CM | POA: Diagnosis not present

## 2017-06-22 DIAGNOSIS — Z9884 Bariatric surgery status: Secondary | ICD-10-CM

## 2017-06-22 DIAGNOSIS — G629 Polyneuropathy, unspecified: Secondary | ICD-10-CM | POA: Diagnosis not present

## 2017-06-22 DIAGNOSIS — F329 Major depressive disorder, single episode, unspecified: Secondary | ICD-10-CM

## 2017-06-22 DIAGNOSIS — I10 Essential (primary) hypertension: Secondary | ICD-10-CM

## 2017-06-22 DIAGNOSIS — I7300 Raynaud's syndrome without gangrene: Secondary | ICD-10-CM | POA: Diagnosis not present

## 2017-06-22 LAB — POCT GLYCOSYLATED HEMOGLOBIN (HGB A1C): Hemoglobin A1C: 5.4

## 2017-06-22 MED ORDER — AMLODIPINE BESYLATE 5 MG PO TABS: 5.0000 mg | ORAL_TABLET | Freq: Every day | ORAL | 3 refills | Status: DC

## 2017-06-22 MED ORDER — LOSARTAN POTASSIUM 50 MG PO TABS: 50.0000 mg | ORAL_TABLET | Freq: Every day | ORAL | 1 refills | Status: DC

## 2017-06-22 MED ORDER — INSULIN PEN NEEDLE 32G X 4 MM MISC: 1.0000 | Freq: Every day | 0 refills | Status: DC

## 2017-06-22 MED ORDER — ZOLPIDEM TARTRATE 10 MG PO TABS: 10.0000 mg | ORAL_TABLET | Freq: Every day | ORAL | 0 refills | Status: DC

## 2017-06-22 MED ORDER — GABAPENTIN 600 MG PO TABS: 900.0000 mg | ORAL_TABLET | Freq: Every day | ORAL | 1 refills | Status: DC

## 2017-06-22 MED ORDER — FLUOXETINE HCL 10 MG PO CAPS: 10.0000 mg | ORAL_CAPSULE | Freq: Every day | ORAL | 1 refills | Status: DC

## 2017-06-22 MED ORDER — RANITIDINE HCL 300 MG PO TABS: 300.0000 mg | ORAL_TABLET | Freq: Every day | ORAL | 1 refills | Status: DC

## 2017-06-22 MED ORDER — SEMAGLUTIDE (1 MG/DOSE) 2 MG/1.5ML ~~LOC~~ SOPN: 1.0000 mg | PEN_INJECTOR | SUBCUTANEOUS | 2 refills | Status: DC

## 2017-06-22 MED ORDER — NYSTATIN 100000 UNIT/GM EX POWD: Freq: Four times a day (QID) | CUTANEOUS | 2 refills | Status: DC

## 2017-06-22 NOTE — Addendum Note (Signed)
Addended by: Chilton Greathouse on: 06/22/2017 01:58 PM   Modules accepted: Orders

## 2017-06-22 NOTE — Progress Notes (Signed)
Name: Shannon Soto   MRN: [id number]    DOB: Apr 22, 1971   Date:06/22/2017       Progress Note  Subjective  Chief Complaint  Chief Complaint  Patient presents with  . Diabetes    HPI  Bariatric Surgery: she had sleeve surgery done by Dr. Hassell Done on March 14th, 2016, she has achieved her goal weight of 160 lbs, even though her personal goal is 145 lbs. Weight prior to surgery was 242 lbs She is doing well, hgbA1C started to go up again, and we started her on Ozempic 02/2017 she has lost 8 lbs since started on medication. . She has recurrent rash under breast and abdominal folds from the significant weight loss and has to use Nystatin prn and medication helps, but symptoms are severe when she does not use powder.  Hyperlipidemia: she is off Pravastatin since surgery. LDL is okay , had bariatric surgery We will recheck labs yearly   HTN: she is on Norvasc for Raynaud's and Losartan for microalbuminuria, bp has been at goal since bariatric surgery, bp is slightly elevated  Insomnia: taking medication and is tolerating it well, denies side effects, only sleeps about 3-4 hours on medication , she was sleeping 5 hours on 15 mg of Ambien but not FDA approved- because she needs to get up early ( she has a son with special needs and does not have enough hours to sleep during the week days ), she denies naps during the day. She sleeps about 8 hours on weekends, because she does not have to get up early . She goes to bed late on the weekdays, between 11:30 and midnight. She denies grogginess during the day. We decreased dose to 10 mg but she is unable to go down any further because can't sleep without Ambien She is aware of FDA guidelines, and is willing to continue medication as is.  Discussed changing to another medication that can work, but she wants to hold off for now. We will continue current dose, however explained it seems like she needs more time to sleep , no a high dose of medication  DMII  with peripheral neuropathy and nephropathy: off metformin since surgery - bariatric surgery in 10/2014 , no polyphagia, but has episodes of polydipsia but no polyuria. On Gabapentin seems to help with neuropathy, taking one and a half 600 mg tablets at night only,  She is back on Losartan , not for bp but for 100 microalbuminuria.  Last hgba1C 5.5% it was up to 6.2% July 2018 , she was started on Ozempic and hgbA1C is down 5.4%  Major depression: taking Fluoxetine, one daily, occasionally takes one extra pill during the day. No side effects. Denies crying spells, or anhedonia, just worries constantly about her son that has special needs .   Migraines: she has tried multiple medications, but could not tolerate, she take Ibuprofen prn, episodes about 2-3 times motnh. Pain is behind her eyes, throbbing like, associated with nausea at times, photophobia. It can last up to 4 hours with medication ( Tylenol  )she stopped taking Goody powders and ibuprofen.  Raynaud's: she states that over the past months she has noticed that fingers on both hands gets white , painful and numb, not sure of the triggers at this time. Cold is make it much worse, she is now on Norvasc, she was seen by Dr. Meda Coffee, tried topical nitroglycerine without improvement of symptoms so she stopped medication She states symptoms are much worse winter months.  She did not keep follow up with Dr. Meda Coffee.   Patient Active Problem List   Diagnosis Date Noted  . Controlled type 2 diabetes mellitus with neuropathy ([place]) 05/12/2016  . Raynaud's phenomenon 05/12/2016  . Intertrigo 09/06/2015  . Migraine headache without aura 02/28/2015  . Obesity (BMI 30-39.9) 02/28/2015  . Hyperlipidemia 02/28/2015  . GERD (gastroesophageal reflux disease) 02/28/2015  . Vitamin D deficiency 02/28/2015  . Chronic constipation 02/28/2015  . Chronic insomnia 02/28/2015  . Peripheral neuropathy 02/28/2015  . Vitiligo 02/28/2015  . Lower leg mass 02/28/2015   . Depression with anxiety 02/28/2015  . History of bariatric surgery 11/05/2014    Past Surgical History:  Procedure Laterality Date  . ABDOMINAL HYSTERECTOMY    . APPENDECTOMY    . BREAST SURGERY    . BREATH TEK H PYLORI N/A 08/27/2014   Procedure: BREATH TEK H PYLORI;  Surgeon: Pedro Earls, MD;  Location: [facility];  Service: General;  Laterality: N/A;  . HIATAL HERNIA REPAIR  11/05/2014   Procedure: LAPAROSCOPIC REPAIR OF HIATAL HERNIA;  Surgeon: Johnathan Hausen, MD;  Location: [facility];  Service: General;;  . LAPAROSCOPIC GASTRIC SLEEVE RESECTION N/A 11/05/2014   Procedure: LAPAROSCOPIC GASTRIC SLEEVE RESECTION;  Surgeon: Johnathan Hausen, MD;  Location: [facility];  Service: General;  Laterality: N/A;  . REDUCTION MAMMAPLASTY Bilateral 1997  . UPPER GI ENDOSCOPY  11/05/2014   Procedure: UPPER GI ENDOSCOPY;  Surgeon: Johnathan Hausen, MD;  Location: [facility];  Service: General;;    Family History  Problem Relation Age of Onset  . Diabetes Paternal Uncle   . Stroke Father   . Hypertension Other   . Breast cancer Maternal Grandmother 74    Social History   Social History  . Marital status: Single    Spouse name: N/A  . Number of children: N/A  . Years of education: N/A   Occupational History  . Not on file.   Social History Main Topics  . Smoking status: Never Smoker  . Smokeless tobacco: Never Used  . Alcohol use No     Comment: occasional   . Drug use: No  . Sexual activity: Yes   Other Topics Concern  . Not on file   Social History Narrative  . No narrative on file     Current Outpatient Prescriptions:  .  amLODipine (NORVASC) 5 MG tablet, Take 1 tablet (5 mg total) by mouth daily., Disp: 90 tablet, Rfl: 3 .  Calcium Carb-Cholecalciferol (CALCIUM-VITAMIN D3) 500-400 MG-UNIT TABS, Take 1 tablet by mouth 3 (three) times daily., Disp: , Rfl:  .  cyanocobalamin 500 MCG tablet, Take 1 mcg by mouth every morning., Disp: , Rfl:  .  ferrous gluconate ([employer] IRON) 240 (27  FE) MG tablet, Take 1 tablet (240 mg total) by mouth 3 (three) times daily with meals., Disp: 30 tablet, Rfl: 0 .  FLUoxetine (PROZAC) 10 MG capsule, Take 1 capsule (10 mg total) by mouth daily., Disp: 90 capsule, Rfl: 1 .  gabapentin (NEURONTIN) 600 MG tablet, Take 1.5 tablets (900 mg total) by mouth at bedtime., Disp: 135 tablet, Rfl: 1 .  losartan (COZAAR) 50 MG tablet, Take 1 tablet (50 mg total) by mouth daily., Disp: 90 tablet, Rfl: 1 .  Multiple Vitamin (MULTIVITAMIN WITH MINERALS) TABS tablet, Take 1 tablet by mouth 2 (two) times daily., Disp: , Rfl:  .  ondansetron (ZOFRAN ODT) 4 MG disintegrating tablet, Take 1 tablet (4 mg total) by mouth every 8 (eight) hours as needed for  nausea or vomiting., Disp: 20 tablet, Rfl: 0 .  polyethylene glycol powder (GLYCOLAX/MIRALAX) powder, Take 17 g by mouth 2 (two) times daily as needed., Disp: 3350 g, Rfl: 1 .  Semaglutide (OZEMPIC) 1 MG/DOSE SOPN, Inject 1 mg into the skin once a week., Disp: 3 mL, Rfl: 2 .  triamcinolone cream (KENALOG) 0.1 %, , Disp: , Rfl: 0 .  zolpidem (AMBIEN) 10 MG tablet, Take 1 tablet (10 mg total) by mouth at bedtime., Disp: 90 tablet, Rfl: 0 .  Insulin Pen Needle (NOVOFINE PLUS) 32G X 4 MM MISC, 1 each by Does not apply route daily., Disp: 100 each, Rfl: 0 .  nystatin (NYSTATIN) powder, Apply topically 4 (four) times daily., Disp: 60 g, Rfl: 2 .  ranitidine (ZANTAC) 300 MG tablet, Take 1 tablet (300 mg total) by mouth at bedtime., Disp: 90 tablet, Rfl: 1  No Known Allergies   ROS  Constitutional: Negative for fever, positive for  weight change.  Respiratory: Negative for cough and shortness of breath.   Cardiovascular: Negative for chest pain or palpitations.  Gastrointestinal: Negative for abdominal pain, no bowel changes.  Musculoskeletal: Negative for gait problem or joint swelling.  Skin: Negative for rash.  Neurological: Negative for dizziness or headache.  No other specific complaints in a complete review of  systems (except as listed in HPI above).  Objective  Vitals:   06/22/17 1322  BP: 140/70  Pulse: 80  Resp: 14  Weight: 160 lb 4.8 oz (72.7 kg)  Height: 5\' 3"  (1.6 m)    Body mass index is 28.4 kg/m.  Physical Exam  Constitutional: Patient appears well-developed and well-nourished. Overweight.   No distress.  HEENT: head atraumatic, normocephalic, pupils equal and reactive to light, neck supple, throat within normal limits Cardiovascular: Normal rate, regular rhythm and normal heart sounds.  No murmur heard. No BLE edema. Pulmonary/Chest: Effort normal and breath sounds normal. No respiratory distress. Abdominal: Soft.  There is no tenderness. Psychiatric: Patient has a normal mood and affect. behavior is normal. Judgment and thought content normal.  Recent Results (from the past 2160 hour(s))  POCT HgB A1C     Status: Normal   Collection Time: 06/22/17  1:31 PM  Result Value Ref Range   Hemoglobin A1C 5.4      PHQ2/9: Depression screen [facility] 2/9 12/08/2016 05/12/2016 09/19/2015 09/06/2015 07/04/2015  Decreased Interest 1 0 0 0 0  Down, Depressed, Hopeless 0 0 0 0 0  PHQ - 2 Score 1 0 0 0 0     Fall Risk: Fall Risk  06/22/2017 12/08/2016 05/12/2016 09/19/2015 09/06/2015  Falls in the past year? No No No No No     Functional Status Survey: Is the patient deaf or have difficulty hearing?: No Does the patient have difficulty seeing, even when wearing glasses/contacts?: No Does the patient have difficulty concentrating, remembering, or making decisions?: No Does the patient have difficulty walking or climbing stairs?: No Does the patient have difficulty dressing or bathing?: No Does the patient have difficulty doing errands alone such as visiting a doctor's office or shopping?: No   Assessment & Plan  1. Controlled type 2 diabetes mellitus with neuropathy (H[facility])  - POCT HgB A1C - Semaglutide (OZEMPIC) 1 MG/DOSE SOPN; Inject 1 mg into the skin once a week.  Dispense: 3 mL;  Refill: 2 - Insulin Pen Needle (NOVOFINE PLUS) 32G X 4 MM MISC; 1 each by Does not apply route daily.  Dispense: 100 each; Refill: 0  2. Mixed  hyperlipidemia   3. History of bariatric surgery  We will recheck labs next visit   4. Major depression, chronic  - FLUoxetine (PROZAC) 10 MG capsule; Take 1 capsule (10 mg total) by mouth daily.  Dispense: 90 capsule; Refill: 1  5. Chronic insomnia  - zolpidem (AMBIEN) 10 MG tablet; Take 1 tablet (10 mg total) by mouth at bedtime.  Dispense: 90 tablet; Refill: 0  6. Type 2 diabetes with nephropathy (HCC)  - losartan (COZAAR) 50 MG tablet; Take 1 tablet (50 mg total) by mouth daily.  Dispense: 90 tablet; Refill: 1 - Insulin Pen Needle (NOVOFINE PLUS) 32G X 4 MM MISC; 1 each by Does not apply route daily.  Dispense: 100 each; Refill: 0  7. Peripheral polyneuropathy  - gabapentin (NEURONTIN) 600 MG tablet; Take 1.5 tablets (900 mg total) by mouth at bedtime.  Dispense: 135 tablet; Refill: 1  8. Controlled type 2 diabetes with neuropathy (HCC)  - gabapentin (NEURONTIN) 600 MG tablet; Take 1.5 tablets (900 mg total) by mouth at bedtime.  Dispense: 135 tablet; Refill: 1  9. Intertrigo  - nystatin (NYSTATIN) powder; Apply topically 4 (four) times daily.  Dispense: 60 g; Refill: 2  10. Hypertension, benign  - amLODipine (NORVASC) 5 MG tablet; Take 1 tablet (5 mg total) by mouth daily.  Dispense: 90 tablet; Refill: 3  11. Raynaud's disease without gangrene  - amLODipine (NORVASC) 5 MG tablet; Take 1 tablet (5 mg total) by mouth daily.  Dispense: 90 tablet; Refill: 3  12. GERD with esophagitis  - ranitidine (ZANTAC) 300 MG tablet; Take 1 tablet (300 mg total) by mouth at bedtime.  Dispense: 90 tablet; Refill: 1

## 2017-06-28 ENCOUNTER — Encounter: Payer: Self-pay | Admitting: Family Medicine

## 2017-06-28 ENCOUNTER — Other Ambulatory Visit: Payer: Self-pay

## 2017-06-28 DIAGNOSIS — E1121 Type 2 diabetes mellitus with diabetic nephropathy: Secondary | ICD-10-CM

## 2017-06-28 DIAGNOSIS — E1140 Type 2 diabetes mellitus with diabetic neuropathy, unspecified: Secondary | ICD-10-CM

## 2017-06-28 MED ORDER — INSULIN PEN NEEDLE 32G X 5 MM MISC: 1.0000 | Freq: Every day | 2 refills | Status: DC

## 2017-06-28 NOTE — Telephone Encounter (Signed)
Pharmacy was not able to supply patient with Novofine 32Gx4MM, so they wanted to switch her to Novotwist 32Gx5MM and Dr. Ancil Boozer gave a verbal order to change.   Refill request for diabetic medication:  Novotwist 32Gx5MM  Last office visit pertaining to diabetes: 06/22/2017    Lab Results  Component Value Date   HGBA1C 5.4 06/22/2017    Next appt: 09/21/2017

## 2017-06-29 ENCOUNTER — Other Ambulatory Visit: Payer: Self-pay | Admitting: Family Medicine

## 2017-06-29 DIAGNOSIS — G43009 Migraine without aura, not intractable, without status migrainosus: Secondary | ICD-10-CM

## 2017-06-29 MED ORDER — ONDANSETRON 4 MG PO TBDP: 4.0000 mg | ORAL_TABLET | Freq: Three times a day (TID) | ORAL | 0 refills | Status: DC | PRN

## 2017-07-19 ENCOUNTER — Encounter: Payer: Self-pay | Admitting: Physician Assistant

## 2017-07-19 ENCOUNTER — Ambulatory Visit: Payer: Self-pay | Admitting: Physician Assistant

## 2017-07-19 VITALS — BP 120/80 | HR 80 | Temp 98.2°F

## 2017-07-19 DIAGNOSIS — R21 Rash and other nonspecific skin eruption: Secondary | ICD-10-CM

## 2017-07-19 MED ORDER — HYDROXYZINE HCL 50 MG PO TABS: 50.0000 mg | ORAL_TABLET | Freq: Three times a day (TID) | ORAL | 0 refills | Status: DC | PRN

## 2017-07-19 MED ORDER — METHYLPREDNISOLONE 4 MG PO TBPK: ORAL_TABLET | ORAL | 0 refills | Status: DC

## 2017-07-19 MED ORDER — DEXAMETHASONE SODIUM PHOSPHATE 10 MG/ML IJ SOLN
10.0000 mg | Freq: Once | INTRAMUSCULAR | Status: AC
  Administered 2017-07-19: 10 mg via INTRAMUSCULAR

## 2017-07-19 NOTE — Progress Notes (Signed)
   Subjective:    Patient ID: Shannon Soto, female    DOB: 1970/10/07, 46 y.o.   MRN: [id number]  HPI Patient complaining of itching rash to scalp, posterior neck, and posterior ears. Patient state crash occurred after using hair dye 4 days ago. Patient stated no relief with over-the-counter Benadryl pill or cream.   Review of Systems Hypertension, depression, and borderline diabetic.    Objective:   Physical Exam Erythematous macular lesions care line of scalp.     Assessment & Plan: Contact dermatitis   Patient given Decadron IM in the clinic. Patient given prescription for Medrol Dosepak and Atarax. Patient advised follow PCP if no improvement 3-5 days.

## 2017-08-14 ENCOUNTER — Telehealth: Payer: 59 | Admitting: Nurse Practitioner

## 2017-08-14 DIAGNOSIS — J0100 Acute maxillary sinusitis, unspecified: Secondary | ICD-10-CM

## 2017-08-14 MED ORDER — FLUCONAZOLE 150 MG PO TABS: ORAL_TABLET | ORAL | 0 refills | Status: DC

## 2017-08-14 MED ORDER — AMOXICILLIN-POT CLAVULANATE 875-125 MG PO TABS: 1.0000 | ORAL_TABLET | Freq: Two times a day (BID) | ORAL | 0 refills | Status: DC

## 2017-08-14 NOTE — Addendum Note (Signed)
Addended by: Chevis Pretty on: 08/14/2017 10:40 AM   Modules accepted: Orders

## 2017-08-14 NOTE — Progress Notes (Signed)

## 2017-09-21 ENCOUNTER — Ambulatory Visit: Payer: 59 | Admitting: Family Medicine

## 2017-09-21 ENCOUNTER — Encounter: Payer: Self-pay | Admitting: Family Medicine

## 2017-09-21 VITALS — BP 132/84 | HR 65 | Temp 97.6°F | Resp 16 | Ht 63.0 in | Wt 147.8 lb

## 2017-09-21 DIAGNOSIS — F5104 Psychophysiologic insomnia: Secondary | ICD-10-CM | POA: Diagnosis not present

## 2017-09-21 DIAGNOSIS — K2100 Gastro-esophageal reflux disease with esophagitis, without bleeding: Secondary | ICD-10-CM

## 2017-09-21 DIAGNOSIS — F330 Major depressive disorder, recurrent, mild: Secondary | ICD-10-CM

## 2017-09-21 DIAGNOSIS — E782 Mixed hyperlipidemia: Secondary | ICD-10-CM | POA: Diagnosis not present

## 2017-09-21 DIAGNOSIS — G43009 Migraine without aura, not intractable, without status migrainosus: Secondary | ICD-10-CM

## 2017-09-21 DIAGNOSIS — Z9884 Bariatric surgery status: Secondary | ICD-10-CM | POA: Diagnosis not present

## 2017-09-21 DIAGNOSIS — I7300 Raynaud's syndrome without gangrene: Secondary | ICD-10-CM

## 2017-09-21 DIAGNOSIS — G629 Polyneuropathy, unspecified: Secondary | ICD-10-CM

## 2017-09-21 DIAGNOSIS — E1140 Type 2 diabetes mellitus with diabetic neuropathy, unspecified: Secondary | ICD-10-CM | POA: Diagnosis not present

## 2017-09-21 DIAGNOSIS — I10 Essential (primary) hypertension: Secondary | ICD-10-CM

## 2017-09-21 LAB — POCT GLYCOSYLATED HEMOGLOBIN (HGB A1C): Hemoglobin A1C: 5.4

## 2017-09-21 MED ORDER — SEMAGLUTIDE (1 MG/DOSE) 2 MG/1.5ML ~~LOC~~ SOPN: 1.0000 mg | PEN_INJECTOR | SUBCUTANEOUS | 2 refills | Status: DC

## 2017-09-21 MED ORDER — ZOLPIDEM TARTRATE 10 MG PO TABS: 10.0000 mg | ORAL_TABLET | Freq: Every day | ORAL | 0 refills | Status: DC

## 2017-09-21 MED ORDER — ONDANSETRON 4 MG PO TBDP: 4.0000 mg | ORAL_TABLET | Freq: Three times a day (TID) | ORAL | 0 refills | Status: DC | PRN

## 2017-09-21 NOTE — Progress Notes (Signed)
Name: Shannon Soto   MRN: [id number]    DOB: [birth year]   Date:09/21/2017       Progress Note  Subjective  Chief Complaint  Chief Complaint  Patient presents with  . Diabetes    Does not check her sugar at home  . Hyperlipidemia  . Medication Refill  . Hypertension    Headaches   . Insomnia    Sleeping on average 3 to 4 hours nightly with the medication    HPI  Bariatric Surgery: she had sleeve surgery done by Dr. Hassell Done on March 14th, 2016, she has achieved her goal weight of 160 lbs, even though her personal goal is 145 lbs, and it almost there 147.8 lbs today. Weight prior to surgery was 242 lbs She is doing well, hgbA1C started to go up again, and we started her on Ozempic 02/2017 she has lost 20 lbs since started on medication 02/2017 . She has recurrent rash under breast and abdominal folds from the significant weight loss and has to use Nystatin prn and medication helps, but symptoms are severe when she does not use powder.  Hyperlipidemia: she is off Pravastatin since surgery. LDL is okay , had bariatric surgery   HTN: she is on Norvasc for Raynaud's and Losartan for microalbuminuria, bp has been at goal since bariatric surgery.  Insomnia: taking medication and is tolerating it well, denies side effects, only sleeps about 3-4 hours on medication , she was sleeping 5 hours on 15 mg of Ambien but not FDA approved- because she needs to get up early ( she has a son with special needs and does not have enough hours to sleep during the week days ), she denies naps during the day. She sleeps about 8 hours on weekends, because she does not have to get up early . She goes to bed late on the weekdays, between 11:30 and midnight. She denies grogginess during the day. We decreased dose to 10 mg but she is unable to go down any further because can't sleep without Ambien She is aware of FDA guidelines, and is willing to continue medication as is. Discussed changing to another medication  that can work, but she wants to hold off for now.  DMII with peripheral neuropathy and nephropathy: off metformin since surgery - bariatric surgery in 10/2014 , no polyphagia, but has episodes of polydipsia but no polyuria. On Gabapentin seems to help with neuropathy, taking one and a half 600 mg tablets at night only,  She is back on Losartan , not for bp but for 100 microalbuminuria. Last hgba1C 5.5% it was up to 6.2% July 2018 , she was started on Ozempic and hgbA1C is down 5.4%, the same level today, but she states it is helping curb her appetite, she has lost weight and is happy with results  Major depression: taking Fluoxetine, one daily, occasionally takes one extra pill during the day. No side effects. Denies crying spells, or anhedonia, just worries constantly about her son that has special needs . He is now in 6 th grade but seems overwhelmed with the environment.   Migraines: she has tried multiple medications, but could not tolerate, she take Ibuprofen prn, episodes about 2-3 times motnh. Pain is behind her eyes, throbbing like, associated with nausea at times, photophobia. It can last up to 4 hours with medication ( Tylenol ) she stopped taking Goody powders and ibuprofen. She takes zofran prn   Raynaud's: she states that over the past months she has  noticed that fingers on both hands gets white , painful and numb, not sure of the triggers at this time. Cold is make it much worse, she is now on Norvasc, she was seen by Dr. Meda Coffee, tried topical nitroglycerine without improvement of symptoms so she stopped medication She states symptoms are much worse winter months. She did not keep follow up with Dr. Meda Coffee.    Patient Active Problem List   Diagnosis Date Noted  . Controlled type 2 diabetes mellitus with neuropathy ([place]) 05/12/2016  . Raynaud's phenomenon 05/12/2016  . Intertrigo 09/06/2015  . Migraine headache without aura 02/28/2015  . Obesity (BMI 30-39.9) 02/28/2015  .  Hyperlipidemia 02/28/2015  . GERD (gastroesophageal reflux disease) 02/28/2015  . Vitamin D deficiency 02/28/2015  . Chronic constipation 02/28/2015  . Chronic insomnia 02/28/2015  . Peripheral neuropathy 02/28/2015  . Vitiligo 02/28/2015  . Lower leg mass 02/28/2015  . Depression with anxiety 02/28/2015  . History of bariatric surgery 11/05/2014    Past Surgical History:  Procedure Laterality Date  . ABDOMINAL HYSTERECTOMY    . APPENDECTOMY    . BREAST SURGERY    . BREATH TEK H PYLORI N/A 08/27/2014   Procedure: BREATH TEK H PYLORI;  Surgeon: Pedro Earls, MD;  Location: [facility];  Service: General;  Laterality: N/A;  . HIATAL HERNIA REPAIR  11/05/2014   Procedure: LAPAROSCOPIC REPAIR OF HIATAL HERNIA;  Surgeon: Johnathan Hausen, MD;  Location: [facility];  Service: General;;  . LAPAROSCOPIC GASTRIC SLEEVE RESECTION N/A 11/05/2014   Procedure: LAPAROSCOPIC GASTRIC SLEEVE RESECTION;  Surgeon: Johnathan Hausen, MD;  Location: [facility];  Service: General;  Laterality: N/A;  . REDUCTION MAMMAPLASTY Bilateral 1997  . UPPER GI ENDOSCOPY  11/05/2014   Procedure: UPPER GI ENDOSCOPY;  Surgeon: Johnathan Hausen, MD;  Location: [facility];  Service: General;;    Family History  Problem Relation Age of Onset  . Diabetes Paternal Uncle   . Stroke Father   . Hypertension Other   . Breast cancer Maternal Grandmother 57    Social History   Socioeconomic History  . Marital status: Single    Spouse name: Not on file  . Number of children: Not on file  . Years of education: Not on file  . Highest education level: Not on file  Social Needs  . Financial resource strain: Not on file  . Food insecurity - worry: Not on file  . Food insecurity - inability: Not on file  . Transportation needs - medical: Not on file  . Transportation needs - non-medical: Not on file  Occupational History  . Not on file  Tobacco Use  . Smoking status: Never Smoker  . Smokeless tobacco: Never Used  Substance and Sexual  Activity  . Alcohol use: No    Alcohol/week: 0.0 oz    Comment: occasional   . Drug use: No  . Sexual activity: Yes  Other Topics Concern  . Not on file  Social History Narrative  . Not on file     Current Outpatient Medications:  .  amLODipine (NORVASC) 5 MG tablet, Take 1 tablet (5 mg total) by mouth daily., Disp: 90 tablet, Rfl: 3 .  Calcium Carb-Cholecalciferol (CALCIUM-VITAMIN D3) 500-400 MG-UNIT TABS, Take 1 tablet by mouth 3 (three) times daily., Disp: , Rfl:  .  cyanocobalamin 500 MCG tablet, Take 1 mcg by mouth every morning., Disp: , Rfl:  .  ferrous gluconate ([employer] IRON) 240 (27 FE) MG tablet, Take 1 tablet (240 mg total) by mouth  3 (three) times daily with meals., Disp: 30 tablet, Rfl: 0 .  FLUoxetine (PROZAC) 10 MG capsule, Take 1 capsule (10 mg total) by mouth daily., Disp: 90 capsule, Rfl: 1 .  gabapentin (NEURONTIN) 600 MG tablet, Take 1.5 tablets (900 mg total) by mouth at bedtime., Disp: 135 tablet, Rfl: 1 .  Insulin Pen Needle (NOVOTWIST) 32G X 5 MM MISC, 1 each daily by Does not apply route., Disp: 100 each, Rfl: 2 .  losartan (COZAAR) 50 MG tablet, Take 1 tablet (50 mg total) by mouth daily., Disp: 90 tablet, Rfl: 1 .  Multiple Vitamin (MULTIVITAMIN WITH MINERALS) TABS tablet, Take 1 tablet by mouth 2 (two) times daily., Disp: , Rfl:  .  nystatin (NYSTATIN) powder, Apply topically 4 (four) times daily., Disp: 60 g, Rfl: 2 .  ondansetron (ZOFRAN ODT) 4 MG disintegrating tablet, Take 1 tablet (4 mg total) every 8 (eight) hours as needed by mouth for nausea or vomiting., Disp: 20 tablet, Rfl: 0 .  polyethylene glycol powder (GLYCOLAX/MIRALAX) powder, Take 17 g by mouth 2 (two) times daily as needed., Disp: 3350 g, Rfl: 1 .  ranitidine (ZANTAC) 300 MG tablet, Take 1 tablet (300 mg total) by mouth at bedtime., Disp: 90 tablet, Rfl: 1 .  Semaglutide (OZEMPIC) 1 MG/DOSE SOPN, Inject 1 mg into the skin once a week., Disp: 3 mL, Rfl: 2 .  triamcinolone cream (KENALOG) 0.1 %, ,  Disp: , Rfl: 0 .  zolpidem (AMBIEN) 10 MG tablet, Take 1 tablet (10 mg total) by mouth at bedtime., Disp: 90 tablet, Rfl: 0  No Known Allergies   ROS  Constitutional: Negative for fever , positive for  weight change.  Respiratory: Negative for cough and shortness of breath.   Cardiovascular: Negative for chest pain or palpitations.  Gastrointestinal: Negative for abdominal pain, no bowel changes.  Musculoskeletal: Negative for gait problem or joint swelling.  Skin: Negative for rash.  Neurological: Negative for dizziness, positive for intermittent headache.  No other specific complaints in a complete review of systems (except as listed in HPI above).  Objective  Vitals:   09/21/17 1324  BP: 132/84  Pulse: 65  Resp: 16  Temp: 97.6 F (36.4 C)  TempSrc: Oral  Weight: 147 lb 12.8 oz (67 kg)  Height: 5\' 3"  (1.6 m)    Body mass index is 26.18 kg/m.  Physical Exam  Constitutional: Patient appears well-developed and well-nourished. Overweight  No distress.  HEENT: head atraumatic, normocephalic, pupils equal and reactive to light,  neck supple, throat within normal limits Cardiovascular: Normal rate, regular rhythm and normal heart sounds.  No murmur heard. No BLE edema. Pulmonary/Chest: Effort normal and breath sounds normal. No respiratory distress. Abdominal: Soft.  There is no tenderness. Psychiatric: Patient has a normal mood and affect. behavior is normal. Judgment and thought content normal. Skin: cold fingertips, no cyanosis.   Recent Results (from the past 2160 hour(s))  POCT HgB A1C     Status: Normal   Collection Time: 09/21/17  1:32 PM  Result Value Ref Range   Hemoglobin A1C 5.4       PHQ2/9: Depression screen [facility] 2/9 09/21/2017 12/08/2016 05/12/2016 09/19/2015 09/06/2015  Decreased Interest 1 1 0 0 0  Down, Depressed, Hopeless 1 0 0 0 0  PHQ - 2 Score 2 1 0 0 0  Altered sleeping 1 - - - -  Tired, decreased energy 1 - - - -  Change in appetite 1 - - - -   Feeling bad  or failure about yourself  0 - - - -  Trouble concentrating 0 - - - -  Moving slowly or fidgety/restless 0 - - - -  Suicidal thoughts 0 - - - -  PHQ-9 Score 5 - - - -  Difficult doing work/chores Somewhat difficult - - - -     Fall Risk: Fall Risk  09/21/2017 06/22/2017 12/08/2016 05/12/2016 09/19/2015  Falls in the past year? No No No No No     Functional Status Survey: Is the patient deaf or have difficulty hearing?: No Does the patient have difficulty seeing, even when wearing glasses/contacts?: No Does the patient have difficulty concentrating, remembering, or making decisions?: No Does the patient have difficulty walking or climbing stairs?: No Does the patient have difficulty dressing or bathing?: No Does the patient have difficulty doing errands alone such as visiting a doctor's office or shopping?: No    Assessment & Plan  1. Controlled type 2 diabetes mellitus with neuropathy (HCC)   - POCT HgB A1C - Semaglutide (OZEMPIC) 1 MG/DOSE SOPN; Inject 1 mg into the skin once a week.  Dispense: 3 mL; Refill: 2  2. Chronic insomnia  - zolpidem (AMBIEN) 10 MG tablet; Take 1 tablet (10 mg total) by mouth at bedtime.  Dispense: 90 tablet; Refill: 0  3. Migraine without aura and without status migrainosus, not intractable  - ondansetron (ZOFRAN ODT) 4 MG disintegrating tablet; Take 1 tablet (4 mg total) by mouth every 8 (eight) hours as needed for nausea or vomiting.  Dispense: 20 tablet; Refill: 0  4. Mixed hyperlipidemia   5. History of bariatric surgery  Doing well, almost at her goal weight of 145 lbs  6. Major depression, chronic  PhQ9 slightly elevated She states worried about her son that may need another surgery   7. Hypertension, benign  At goal   8. Raynaud's phenomenon without gangrene  Still has symptoms  9. GERD with esophagitis  Under control   10. Peripheral polyneuropathy  stable

## 2017-10-28 DIAGNOSIS — H524 Presbyopia: Secondary | ICD-10-CM | POA: Diagnosis not present

## 2017-10-28 LAB — HM DIABETES EYE EXAM

## 2017-11-05 ENCOUNTER — Telehealth: Payer: 59 | Admitting: Family

## 2017-11-05 DIAGNOSIS — J069 Acute upper respiratory infection, unspecified: Secondary | ICD-10-CM | POA: Diagnosis not present

## 2017-11-05 MED ORDER — BENZONATATE 100 MG PO CAPS: 100.0000 mg | ORAL_CAPSULE | Freq: Three times a day (TID) | ORAL | 0 refills | Status: DC | PRN

## 2017-11-05 MED ORDER — FLUTICASONE PROPIONATE 50 MCG/ACT NA SUSP: 2.0000 | Freq: Every day | NASAL | 6 refills | Status: DC

## 2017-11-05 NOTE — Progress Notes (Signed)
We are sorry you are not feeling well.  Here is how we plan to help!  Based on what you have shared with me, it looks like you may have a viral upper respiratory infection or a "common cold".  Colds are caused by a large number of viruses; however, rhinovirus is the most common cause.   Symptoms of the common cold vary from person to person, with common symptoms including sore throat, cough, and malaise.  A low-grade fever of 100.4 may present, but is often uncommon.  Symptoms vary however, and are closely related to a person's age or underlying illnesses.  The most common symptoms associated with the common cold are nasal discharge or congestion, cough, sneezing, headache and pressure in the ears and face.  Cold symptoms usually persist for about 3 to 10 days, but can last up to 2 weeks.  It is important to know that colds do not cause serious illness or complications in most cases.    The common cold is transmitted from person to person, with the most common method of transmission being a person's hands.  The virus is able to live on the skin and can infect other persons for up to 2 hours after direct contact.  Also, colds are transmitted when someone coughs or sneezes; thus, it is important to cover the mouth to reduce this risk.  To keep the spread of the common cold at [place], good hand hygiene is very important.  This is an infection that is most likely caused by a virus. There are no specific treatments for the common cold other than to help you with the symptoms until the infection runs its course.    For nasal congestion, you may use an oral decongestants such as Mucinex D or if you have glaucoma or high blood pressure use plain Mucinex.  Saline nasal spray or nasal drops can help and can safely be used as often as needed for congestion.  For your congestion, I have prescribed Fluticasone nasal spray one spray in each nostril twice a day  If you do not have a history of heart disease, hypertension,  diabetes or thyroid disease, prostate/bladder issues or glaucoma, you may also use Sudafed to treat nasal congestion.  It is highly recommended that you consult with a pharmacist or your primary care physician to ensure this medication is safe for you to take.     If you have a cough, you may use cough suppressants such as Delsym and Robitussin.  If you have glaucoma or high blood pressure, you can also use Coricidin HBP.   For cough I have prescribed for you A prescription cough medication called Tessalon Perles 100 mg. You may take 1-2 capsules every 8 hours as needed for cough   Providers prescribe antibiotics to treat infections caused by bacteria. Antibiotics are very powerful in treating bacterial infections when they are used properly. To maintain their effectiveness, they should be used only when necessary. Overuse of antibiotics has resulted in the development of superbugs that are resistant to treatment!    After careful review of your answers, I would not recommend an antibiotic for your condition.  Antibiotics are not effective against viruses and therefore should not be used to treat them. Common examples of infections caused by viruses include colds and flu.   If you have a sore or scratchy throat, use a saltwater gargle-  to  teaspoon of salt dissolved in a 4-ounce to 8-ounce glass of warm water.  Gargle  the solution for approximately 15-30 seconds and then spit.  It is important not to swallow the solution.  You can also use throat lozenges/cough drops and Chloraseptic spray to help with throat pain or discomfort.  Warm or cold liquids can also be helpful in relieving throat pain.  For headache, pain or general discomfort, you can use Ibuprofen or Tylenol as directed.   Some authorities believe that zinc sprays or the use of Echinacea may shorten the course of your symptoms.   HOME CARE . Only take medications as instructed by your medical team. . Be sure to drink plenty of  fluids. Water is fine as well as fruit juices, sodas and electrolyte beverages. You may want to stay away from caffeine or alcohol. If you are nauseated, try taking small sips of liquids. How do you know if you are getting enough fluid? Your urine should be a pale yellow or almost colorless. . Get rest. . Taking a steamy shower or using a humidifier may help nasal congestion and ease sore throat pain. You can place a towel over your head and breathe in the steam from hot water coming from a faucet. . Using a saline nasal spray works much the same way. . Cough drops, hard candies and sore throat lozenges may ease your cough. . Avoid close contacts especially the very young and the elderly . Cover your mouth if you cough or sneeze . Always remember to wash your hands.   GET HELP RIGHT AWAY IF: . You develop worsening fever. . If your symptoms do not improve within 10 days . You become short of breath. . You develop yellow or green discharge from your nose over 3 days. . You have coughing fits . You develop a severe head ache or visual changes. . You develop shortness of breath or difficulty breathing. . Your symptoms persist after you have completed your treatment plan  MAKE SURE YOU   Understand these instructions.  Will watch your condition.  Will get help right away if you are not doing well or get worse.  Your e-visit answers were reviewed by a board certified advanced clinical practitioner to complete your personal care plan. Depending upon the condition, your plan could have included both over the counter or prescription medications. Please review your pharmacy choice. If there is a problem, you may call our nursing hot line at and have the prescription routed to another pharmacy. Your safety is important to [place]. If you have drug allergies check your prescription carefully.   You can use MyChart to ask questions about today's visit, request a non-urgent call back, or ask for a work  or school excuse for 24 hours related to this e-Visit. If it has been greater than 24 hours you will need to follow up with your provider, or enter a new e-Visit to address those concerns. You will get an e-mail in the next two days asking about your experience.  I hope that your e-visit has been valuable and will speed your recovery. Thank you for using e-visits.

## 2017-11-18 DIAGNOSIS — H04123 Dry eye syndrome of bilateral lacrimal glands: Secondary | ICD-10-CM | POA: Diagnosis not present

## 2017-12-21 ENCOUNTER — Other Ambulatory Visit: Payer: Self-pay | Admitting: Family Medicine

## 2017-12-21 DIAGNOSIS — Z1231 Encounter for screening mammogram for malignant neoplasm of breast: Secondary | ICD-10-CM

## 2017-12-28 ENCOUNTER — Ambulatory Visit: Payer: 59 | Admitting: Family Medicine

## 2017-12-29 ENCOUNTER — Ambulatory Visit: Payer: 59 | Admitting: Family Medicine

## 2017-12-29 ENCOUNTER — Encounter: Payer: Self-pay | Admitting: Family Medicine

## 2017-12-29 VITALS — BP 140/78 | HR 76 | Resp 14 | Ht 63.0 in | Wt 143.1 lb

## 2017-12-29 DIAGNOSIS — G43009 Migraine without aura, not intractable, without status migrainosus: Secondary | ICD-10-CM | POA: Diagnosis not present

## 2017-12-29 DIAGNOSIS — E538 Deficiency of other specified B group vitamins: Secondary | ICD-10-CM

## 2017-12-29 DIAGNOSIS — G629 Polyneuropathy, unspecified: Secondary | ICD-10-CM | POA: Diagnosis not present

## 2017-12-29 DIAGNOSIS — E559 Vitamin D deficiency, unspecified: Secondary | ICD-10-CM

## 2017-12-29 DIAGNOSIS — F330 Major depressive disorder, recurrent, mild: Secondary | ICD-10-CM | POA: Diagnosis not present

## 2017-12-29 DIAGNOSIS — I7300 Raynaud's syndrome without gangrene: Secondary | ICD-10-CM

## 2017-12-29 DIAGNOSIS — F5104 Psychophysiologic insomnia: Secondary | ICD-10-CM

## 2017-12-29 DIAGNOSIS — I10 Essential (primary) hypertension: Secondary | ICD-10-CM

## 2017-12-29 DIAGNOSIS — E1121 Type 2 diabetes mellitus with diabetic nephropathy: Secondary | ICD-10-CM | POA: Diagnosis not present

## 2017-12-29 DIAGNOSIS — E1140 Type 2 diabetes mellitus with diabetic neuropathy, unspecified: Secondary | ICD-10-CM | POA: Diagnosis not present

## 2017-12-29 DIAGNOSIS — K2100 Gastro-esophageal reflux disease with esophagitis, without bleeding: Secondary | ICD-10-CM

## 2017-12-29 DIAGNOSIS — Z9884 Bariatric surgery status: Secondary | ICD-10-CM

## 2017-12-29 LAB — POCT GLYCOSYLATED HEMOGLOBIN (HGB A1C): Hemoglobin A1C: 5.3

## 2017-12-29 LAB — POCT UA - MICROALBUMIN: Microalbumin Ur, POC: NEGATIVE mg/L

## 2017-12-29 MED ORDER — ZOLPIDEM TARTRATE 10 MG PO TABS: 10.0000 mg | ORAL_TABLET | Freq: Every day | ORAL | 0 refills | Status: DC

## 2017-12-29 MED ORDER — GABAPENTIN 600 MG PO TABS: 900.0000 mg | ORAL_TABLET | Freq: Every day | ORAL | 1 refills | Status: DC

## 2017-12-29 MED ORDER — ONDANSETRON 4 MG PO TBDP: 4.0000 mg | ORAL_TABLET | Freq: Three times a day (TID) | ORAL | 0 refills | Status: DC | PRN

## 2017-12-29 MED ORDER — LOSARTAN POTASSIUM 100 MG PO TABS: 100.0000 mg | ORAL_TABLET | Freq: Every day | ORAL | 1 refills | Status: DC

## 2017-12-29 MED ORDER — FLUOXETINE HCL 10 MG PO CAPS: 10.0000 mg | ORAL_CAPSULE | Freq: Every day | ORAL | 1 refills | Status: DC

## 2017-12-29 MED ORDER — RANITIDINE HCL 300 MG PO TABS: 300.0000 mg | ORAL_TABLET | Freq: Every day | ORAL | 1 refills | Status: DC

## 2017-12-29 MED ORDER — SEMAGLUTIDE (1 MG/DOSE) 2 MG/1.5ML ~~LOC~~ SOPN: 1.0000 mg | PEN_INJECTOR | SUBCUTANEOUS | 5 refills | Status: DC

## 2017-12-29 NOTE — Progress Notes (Signed)
Name: Shannon Soto   MRN: [id number]    DOB: 12-26-1970   Date:12/29/2017       Progress Note  Subjective  Chief Complaint  Chief Complaint  Patient presents with  . Diabetes  . Anxiety  . Migraine  . Hyperlipidemia    HPI  Bariatric Surgery: she had sleeve surgery done by Dr. Hassell Done on March 14th, 2016, she has achieved her goal weight of 145 lbs, but she now wants a normal BMI and has a few pounds to lose. Weight prior to surgery was 242 lbsShe is doing well,hgbA1C started to go up, and we started her on Ozempic 02/2017 she has lost 25 lbs since started on medication 02/2017 . She has recurrent rash under breast ( but that part is better) and abdominal folds from the significant weight loss and has to use Nystatin prn and medication helps, but symptoms are severe when she does not use powder.  Hyperlipidemia: she is off Pravastatin since surgery. LDL is okay , had bariatric surgery. We will recheck labs today   HTN: she is on Norvasc for Raynaud's and Losartan for microalbuminuria, bp is slightly elevated today and we will adjust dose of Losartan to 100 mg daily   Insomnia: taking medication and is tolerating it well, denies side effects, only sleeps about 3-4 hours on medication , she was sleeping 5 hours on 15 mg of Ambien but not FDA approved- because she needs to get up early ( she has a son with special needs and does not have enough hours to sleep during the week days ), she denies naps during the day. She sleeps about 8 hours on weekends, because she does not have to get up early . She goes to bed late on the weekdays, between 11:30 and midnight. She denies grogginess during the day. We decreased dose to 10 mg but she is unable to go down any further because can't sleep without Ambien She is aware of FDA guidelines, and is willing to continue medication as is. Discussed changing to another medication that can work, but she wants to hold off for now. Unchanged. Needs  refill  DMII with peripheral neuropathy and nephropathy: off metformin since surgery - bariatric surgery in 10/2014 , no polyphagia, but has episodes of polydipsia but no polyuria an, but has nocturia times one every night.. On Gabapentin seems to help with neuropathy, taking one and a half 600 mg tablets at night only,She is back on Losartan , for 100 microalbuminuria.  hgba1C 5.5%it was up to 6.2% July 2018 , she was started on Ozempic and hgbA1C is down 5.4%, today it is 5.3% and negative urine micro. Eye exam is up to date  Major depression: taking Fluoxetine, one daily, occasionally takes one extra pill during the day. No side effects. Denies crying spells, or anhedonia, just worries constantly about her son that has special needs . He is now in 6 th grade but seems overwhelmed with the environment.   Migraines: she has tried multiple medications, but could not tolerate, she take Ibuprofen prn, episodes about 2-3 times motnh. Pain is behind her eyes, throbbing like, associated with nausea at times, photophobia. It can last up to 4 hours with medication ( Tylenol ) she is also taking Goody powders and ibuprofen. She takes zofran prn . Reminded her again that NSAID's are very dangerous and can cause GI bleed in patients that had bariatric surgery.  Raynaud's: she states that over the past months she has noticed  that fingers on both hands gets white , painful and numb, not sure of the triggers at this time. Cold is make it much worse, she is now on Norvasc, she was seen by Dr. Meda Coffee, tried topical nitroglycerine without improvement of symptoms so she stopped medication She states symptoms are much worse winter months. She did not keep follow up with Dr. Meda Coffee.Unchanged    Patient Active Problem List   Diagnosis Date Noted  . Controlled type 2 diabetes mellitus with neuropathy ([place]) 05/12/2016  . Raynaud's phenomenon 05/12/2016  . Intertrigo 09/06/2015  . Migraine headache without aura  02/28/2015  . History of obesity 02/28/2015  . Hyperlipidemia 02/28/2015  . GERD (gastroesophageal reflux disease) 02/28/2015  . Vitamin D deficiency 02/28/2015  . Chronic constipation 02/28/2015  . Chronic insomnia 02/28/2015  . Peripheral neuropathy 02/28/2015  . Vitiligo 02/28/2015  . Lower leg mass 02/28/2015  . Depression with anxiety 02/28/2015  . History of bariatric surgery 11/05/2014    Past Surgical History:  Procedure Laterality Date  . ABDOMINAL HYSTERECTOMY    . APPENDECTOMY    . BREAST SURGERY    . BREATH TEK H PYLORI N/A 08/27/2014   Procedure: BREATH TEK H PYLORI;  Surgeon: Pedro Earls, MD;  Location: [facility];  Service: General;  Laterality: N/A;  . HIATAL HERNIA REPAIR  11/05/2014   Procedure: LAPAROSCOPIC REPAIR OF HIATAL HERNIA;  Surgeon: Johnathan Hausen, MD;  Location: [facility];  Service: General;;  . LAPAROSCOPIC GASTRIC SLEEVE RESECTION N/A 11/05/2014   Procedure: LAPAROSCOPIC GASTRIC SLEEVE RESECTION;  Surgeon: Johnathan Hausen, MD;  Location: [facility];  Service: General;  Laterality: N/A;  . REDUCTION MAMMAPLASTY Bilateral 1997  . UPPER GI ENDOSCOPY  11/05/2014   Procedure: UPPER GI ENDOSCOPY;  Surgeon: Johnathan Hausen, MD;  Location: [facility];  Service: General;;    Family History  Problem Relation Age of Onset  . Diabetes Paternal Uncle   . Stroke Father   . Hypertension Other   . Breast cancer Maternal Grandmother 67    Social History   Socioeconomic History  . Marital status: Unknown    Spouse name: Not on file  . Number of children: 3  . Years of education: Not on file  . Highest education level: Associate degree: occupational, Hotel manager, or vocational program  Occupational History  . Not on file  Social Needs  . Financial resource strain: Not on file  . Food insecurity:    Worry: Not on file    Inability: Not on file  . Transportation needs:    Medical: Not on file    Non-medical: Not on file  Tobacco Use  . Smoking status: Never  Smoker  . Smokeless tobacco: Never Used  Substance and Sexual Activity  . Alcohol use: No    Alcohol/week: 0.0 oz    Comment: occasional   . Drug use: No  . Sexual activity: Yes  Lifestyle  . Physical activity:    Days per week: Not on file    Minutes per session: Not on file  . Stress: Not on file  Relationships  . Social connections:    Talks on phone: Not on file    Gets together: Not on file    Attends religious service: Not on file    Active member of club or organization: Not on file    Attends meetings of clubs or organizations: Not on file    Relationship status: Not on file  . Intimate partner violence:    Fear of  current or ex partner: Not on file    Emotionally abused: Not on file    Physically abused: Not on file    Forced sexual activity: Not on file  Other Topics Concern  . Not on file  Social History Narrative  . Not on file     Current Outpatient Medications:  .  amLODipine (NORVASC) 5 MG tablet, Take 1 tablet (5 mg total) by mouth daily., Disp: 90 tablet, Rfl: 3 .  Calcium Carb-Cholecalciferol (CALCIUM-VITAMIN D3) 500-400 MG-UNIT TABS, Take 1 tablet by mouth 3 (three) times daily., Disp: , Rfl:  .  cyanocobalamin 500 MCG tablet, Take 1 mcg by mouth every morning., Disp: , Rfl:  .  ferrous gluconate ([employer] IRON) 240 (27 FE) MG tablet, Take 1 tablet (240 mg total) by mouth 3 (three) times daily with meals., Disp: 30 tablet, Rfl: 0 .  FLUoxetine (PROZAC) 10 MG capsule, Take 1 capsule (10 mg total) by mouth daily., Disp: 90 capsule, Rfl: 1 .  gabapentin (NEURONTIN) 600 MG tablet, Take 1.5 tablets (900 mg total) by mouth at bedtime., Disp: 135 tablet, Rfl: 1 .  Insulin Pen Needle (NOVOTWIST) 32G X 5 MM MISC, 1 each daily by Does not apply route., Disp: 100 each, Rfl: 2 .  losartan (COZAAR) 100 MG tablet, Take 1 tablet (100 mg total) by mouth daily., Disp: 90 tablet, Rfl: 1 .  Multiple Vitamin (MULTIVITAMIN WITH MINERALS) TABS tablet, Take 1 tablet by mouth 2 (two)  times daily., Disp: , Rfl:  .  nystatin (NYSTATIN) powder, Apply topically 4 (four) times daily., Disp: 60 g, Rfl: 2 .  ondansetron (ZOFRAN ODT) 4 MG disintegrating tablet, Take 1 tablet (4 mg total) by mouth every 8 (eight) hours as needed for nausea or vomiting., Disp: 20 tablet, Rfl: 0 .  polyethylene glycol powder (GLYCOLAX/MIRALAX) powder, Take 17 g by mouth 2 (two) times daily as needed., Disp: 3350 g, Rfl: 1 .  ranitidine (ZANTAC) 300 MG tablet, Take 1 tablet (300 mg total) by mouth at bedtime., Disp: 90 tablet, Rfl: 1 .  Semaglutide (OZEMPIC) 1 MG/DOSE SOPN, Inject 1 mg into the skin once a week., Disp: 3 mL, Rfl: 5 .  triamcinolone cream (KENALOG) 0.1 %, , Disp: , Rfl: 0 .  zolpidem (AMBIEN) 10 MG tablet, Take 1 tablet (10 mg total) by mouth at bedtime., Disp: 90 tablet, Rfl: 0  No Known Allergies   ROS  Constitutional: Negative for fever , positive for mild  weight change.  Respiratory: Negative for cough and shortness of breath.   Cardiovascular: Negative for chest pain or palpitations.  Gastrointestinal: Negative for abdominal pain, no bowel changes.  Musculoskeletal: Negative for gait problem or joint swelling.  Skin: Positive  for rash.  Neurological: Negative for dizziness , positive for intermittent headache.  No other specific complaints in a complete review of systems (except as listed in HPI above).  Objective  Vitals:   12/29/17 0744  BP: 140/78  Pulse: 76  Resp: 14  SpO2: 98%  Weight: 143 lb 1.6 oz (64.9 kg)  Height: 5\' 3"  (1.6 m)    Body mass index is 25.35 kg/m.  Physical Exam  Constitutional: Patient appears well-developed and well-nourished.No distress.  HEENT: head atraumatic, normocephalic, pupils equal and reactive to light, neck supple, throat within normal limits Cardiovascular: Normal rate, regular rhythm and normal heart sounds.  No murmur heard. No BLE edema. Pulmonary/Chest: Effort normal and breath sounds normal. No respiratory  distress. Abdominal: Soft.  There is no tenderness.  Psychiatric: Patient has a normal mood and affect. behavior is normal. Judgment and thought content normal.   Recent Results (from the past 2160 hour(s))  POCT HgB A1C     Status: Abnormal   Collection Time: 12/29/17  8:00 AM  Result Value Ref Range   Hemoglobin A1C 5.3   POCT UA - Microalbumin     Status: Normal   Collection Time: 12/29/17  8:01 AM  Result Value Ref Range   Microalbumin Ur, POC negative mg/L   Creatinine, POC  mg/dL   Albumin/Creatinine Ratio, Urine, POC       PHQ2/9: Depression screen [facility] 2/9 12/29/2017 09/21/2017 12/08/2016 05/12/2016 09/19/2015  Decreased Interest 0 1 1 0 0  Down, Depressed, Hopeless 1 1 0 0 0  PHQ - 2 Score 1 2 1  0 0  Altered sleeping 1 1 - - -  Tired, decreased energy 1 1 - - -  Change in appetite 0 1 - - -  Feeling bad or failure about yourself  0 0 - - -  Trouble concentrating 0 0 - - -  Moving slowly or fidgety/restless 0 0 - - -  Suicidal thoughts 0 0 - - -  PHQ-9 Score 3 5 - - -  Difficult doing work/chores Somewhat difficult Somewhat difficult - - -    Fall Risk: Fall Risk  12/29/2017 09/21/2017 06/22/2017 12/08/2016 05/12/2016  Falls in the past year? No No No No No    Functional Status Survey: Is the patient deaf or have difficulty hearing?: No Does the patient have difficulty seeing, even when wearing glasses/contacts?: No Does the patient have difficulty concentrating, remembering, or making decisions?: No Does the patient have difficulty walking or climbing stairs?: No Does the patient have difficulty doing errands alone such as visiting a doctor's office or shopping?: No   Assessment & Plan  1. Controlled type 2 diabetes mellitus with neuropathy (H[facility])  - POCT HgB A1C - POCT UA - Microalbumin - gabapentin (NEURONTIN) 600 MG tablet; Take 1.5 tablets (900 mg total) by mouth at bedtime.  Dispense: 135 tablet; Refill: 1 - Semaglutide (OZEMPIC) 1 MG/DOSE SOPN; Inject 1 mg into  the skin once a week.  Dispense: 3 mL; Refill: 5 - Lipid panel  2. Major depression, chronic  - FLUoxetine (PROZAC) 10 MG capsule; Take 1 capsule (10 mg total) by mouth daily.  Dispense: 90 capsule; Refill: 1  3. Peripheral polyneuropathy  - gabapentin (NEURONTIN) 600 MG tablet; Take 1.5 tablets (900 mg total) by mouth at bedtime.  Dispense: 135 tablet; Refill: 1  4. Type 2 diabetes with nephropathy (HCC)  - losartan (COZAAR) 100 MG tablet; Take 1 tablet (100 mg total) by mouth daily.  Dispense: 90 tablet; Refill: 1  5. Migraine without aura and without status migrainosus, not intractable  - ondansetron (ZOFRAN ODT) 4 MG disintegrating tablet; Take 1 tablet (4 mg total) by mouth every 8 (eight) hours as needed for nausea or vomiting.  Dispense: 20 tablet; Refill: 0  4. GERD  - ranitidine (ZANTAC) 300 MG tablet; Take 1 tablet (300 mg total) by mouth at bedtime.  Dispense: 90 tablet; Refill: 1  7. Chronic insomnia  - zolpidem (AMBIEN) 10 MG tablet; Take 1 tablet (10 mg total) by mouth at bedtime.  Dispense: 90 tablet; Refill: 0  8. Vitamin D deficiency  - VITAMIN D 25 Hydroxy (Vit-D Deficiency, Fractures)  9. Raynaud's disease without gangrene  Stable   10. B12 deficiency  - Vitamin B12  11. Hypertension, benign  Adjust dose of Losartan from 50 to 100 - COMPLETE METABOLIC PANEL WITH GFR - CBC with Differential/Platelet  12. History of bariatric surgery  Check labs, doing well

## 2018-01-18 ENCOUNTER — Encounter: Payer: Self-pay | Admitting: Family Medicine

## 2018-01-18 ENCOUNTER — Ambulatory Visit (INDEPENDENT_AMBULATORY_CARE_PROVIDER_SITE_OTHER): Payer: Self-pay | Admitting: Family Medicine

## 2018-01-18 VITALS — BP 150/72 | HR 68 | Temp 98.1°F | Ht 63.0 in | Wt 143.0 lb

## 2018-01-18 DIAGNOSIS — Z0000 Encounter for general adult medical examination without abnormal findings: Secondary | ICD-10-CM

## 2018-01-18 LAB — POCT URINALYSIS DIPSTICK
Bilirubin, UA: NEGATIVE
Blood, UA: NEGATIVE
Glucose, UA: NEGATIVE
KETONES UA: NEGATIVE
Leukocytes, UA: NEGATIVE
NITRITE UA: NEGATIVE
PROTEIN UA: NEGATIVE
Urobilinogen, UA: 0.2 E.U./dL
pH, UA: 6 (ref 5.0–8.0)

## 2018-01-18 NOTE — Patient Instructions (Addendum)
Follow-up with PCP for routine fasting labs.  Continue medications as prescribed.   Continue routine physical activity.   Health Maintenance, Female Adopting a healthy lifestyle and getting preventive care can go a long way to promote health and wellness. Talk with your health care provider about what schedule of regular examinations is right for you. This is a good chance for you to check in with your provider about disease prevention and staying healthy. In between checkups, there are plenty of things you can do on your own. Experts have done a lot of research about which lifestyle changes and preventive measures are most likely to keep you healthy. Ask your health care provider for more information. Weight and diet Eat a healthy diet  Be sure to include plenty of vegetables, fruits, low-fat dairy products, and lean protein.  Do not eat a lot of foods high in solid fats, added sugars, or salt.  Get regular exercise. This is one of the most important things you can do for your health. ? Most adults should exercise for at least 150 minutes each week. The exercise should increase your heart rate and make you sweat (moderate-intensity exercise). ? Most adults should also do strengthening exercises at least twice a week. This is in addition to the moderate-intensity exercise.  Maintain a healthy weight  Body mass index (BMI) is a measurement that can be used to identify possible weight problems. It estimates body fat based on height and weight. Your health care provider can help determine your BMI and help you achieve or maintain a healthy weight.  For females 39 years of age and older: ? A BMI below 18.5 is considered underweight. ? A BMI of 18.5 to 24.9 is normal. ? A BMI of 25 to 29.9 is considered overweight. ? A BMI of 30 and above is considered obese.  Watch levels of cholesterol and blood lipids  You should start having your blood tested for lipids and cholesterol at 49 years of  age, then have this test every 5 years.  You may need to have your cholesterol levels checked more often if: ? Your lipid or cholesterol levels are high. ? You are older than 49 years of age. ? You are at high risk for heart disease.  Cancer screening Lung Cancer  Lung cancer screening is recommended for adults 23-20 years old who are at high risk for lung cancer because of a history of smoking.  A yearly low-dose CT scan of the lungs is recommended for people who: ? Currently smoke. ? Have quit within the past 15 years. ? Have at least a 30-pack-year history of smoking. A pack year is smoking an average of one pack of cigarettes a day for 1 year.  Yearly screening should continue until it has been 15 years since you quit.  Yearly screening should stop if you develop a health problem that would prevent you from having lung cancer treatment.  Breast Cancer  Practice breast self-awareness. This means understanding how your breasts normally appear and feel.  It also means doing regular breast self-exams. Let your health care provider know about any changes, no matter how small.  If you are in your 20s or 30s, you should have a clinical breast exam (CBE) by a health care provider every 1-3 years as part of a regular health exam.  If you are 22 or older, have a CBE every year. Also consider having a breast X-ray (mammogram) every year.  If you have a family  history of breast cancer, talk to your health care provider about genetic screening.  If you are at high risk for breast cancer, talk to your health care provider about having an MRI and a mammogram every year.  Breast cancer gene (BRCA) assessment is recommended for women who have family members with BRCA-related cancers. BRCA-related cancers include: ? Breast. ? Ovarian. ? Tubal. ? Peritoneal cancers.  Results of the assessment will determine the need for genetic counseling and BRCA1 and BRCA2 testing.  Cervical  Cancer Your health care provider may recommend that you be screened regularly for cancer of the pelvic organs (ovaries, uterus, and vagina). This screening involves a pelvic examination, including checking for microscopic changes to the surface of your cervix (Pap test). You may be encouraged to have this screening done every 3 years, beginning at age 37.  For women ages 74-65, health care providers may recommend pelvic exams and Pap testing every 3 years, or they may recommend the Pap and pelvic exam, combined with testing for human papilloma virus (HPV), every 5 years. Some types of HPV increase your risk of cervical cancer. Testing for HPV may also be done on women of any age with unclear Pap test results.  Other health care providers may not recommend any screening for nonpregnant women who are considered low risk for pelvic cancer and who do not have symptoms. Ask your health care provider if a screening pelvic exam is right for you.  If you have had past treatment for cervical cancer or a condition that could lead to cancer, you need Pap tests and screening for cancer for at least 20 years after your treatment. If Pap tests have been discontinued, your risk factors (such as having a new sexual partner) need to be reassessed to determine if screening should resume. Some women have medical problems that increase the chance of getting cervical cancer. In these cases, your health care provider may recommend more frequent screening and Pap tests.  Colorectal Cancer  This type of cancer can be detected and often prevented.  Routine colorectal cancer screening usually begins at 49 years of age and continues through 49 years of age.  Your health care provider may recommend screening at an earlier age if you have risk factors for colon cancer.  Your health care provider may also recommend using home test kits to check for hidden blood in the stool.  A small camera at the end of a tube can be used to  examine your colon directly (sigmoidoscopy or colonoscopy). This is done to check for the earliest forms of colorectal cancer.  Routine screening usually begins at age 58.  Direct examination of the colon should be repeated every 5-10 years through 49 years of age. However, you may need to be screened more often if early forms of precancerous polyps or small growths are found.  Skin Cancer  Check your skin from head to toe regularly.  Tell your health care provider about any new moles or changes in moles, especially if there is a change in a mole's shape or color.  Also tell your health care provider if you have a mole that is larger than the size of a pencil eraser.  Always use sunscreen. Apply sunscreen liberally and repeatedly throughout the day.  Protect yourself by wearing long sleeves, pants, a wide-brimmed hat, and sunglasses whenever you are outside.  Heart disease, diabetes, and high blood pressure  High blood pressure causes heart disease and increases the risk of stroke.  High blood pressure is more likely to develop in: ? People who have blood pressure in the high end of the normal range (130-139/85-89 mm Hg). ? People who are overweight or obese. ? People who are African American.  If you are 9-75 years of age, have your blood pressure checked every 3-5 years. If you are 27 years of age or older, have your blood pressure checked every year. You should have your blood pressure measured twice-once when you are at a hospital or clinic, and once when you are not at a hospital or clinic. Record the average of the two measurements. To check your blood pressure when you are not at a hospital or clinic, you can use: ? An automated blood pressure machine at a pharmacy. ? A home blood pressure monitor.  If you are between 34 years and 27 years old, ask your health care provider if you should take aspirin to prevent strokes.  Have regular diabetes screenings. This involves taking a  blood sample to check your fasting blood sugar level. ? If you are at a normal weight and have a low risk for diabetes, have this test once every three years after 49 years of age. ? If you are overweight and have a high risk for diabetes, consider being tested at a younger age or more often. Preventing infection Hepatitis B  If you have a higher risk for hepatitis B, you should be screened for this virus. You are considered at high risk for hepatitis B if: ? You were born in a country where hepatitis B is common. Ask your health care provider which countries are considered high risk. ? Your parents were born in a high-risk country, and you have not been immunized against hepatitis B (hepatitis B vaccine). ? You have HIV or AIDS. ? You use needles to inject street drugs. ? You live with someone who has hepatitis B. ? You have had sex with someone who has hepatitis B. ? You get hemodialysis treatment. ? You take certain medicines for conditions, including cancer, organ transplantation, and autoimmune conditions.  Hepatitis C  Blood testing is recommended for: ? Everyone born from [birth year] through 1967. ? Anyone with known risk factors for hepatitis C.  Sexually transmitted infections (STIs)  You should be screened for sexually transmitted infections (STIs) including gonorrhea and chlamydia if: ? You are sexually active and are younger than 49 years of age. ? You are older than 49 years of age and your health care provider tells you that you are at risk for this type of infection. ? Your sexual activity has changed since you were last screened and you are at an increased risk for chlamydia or gonorrhea. Ask your health care provider if you are at risk.  If you do not have HIV, but are at risk, it may be recommended that you take a prescription medicine daily to prevent HIV infection. This is called pre-exposure prophylaxis (PrEP). You are considered at risk if: ? You are sexually active and  do not regularly use condoms or know the HIV status of your partner(s). ? You take drugs by injection. ? You are sexually active with a partner who has HIV.  Talk with your health care provider about whether you are at high risk of being infected with HIV. If you choose to begin PrEP, you should first be tested for HIV. You should then be tested every 3 months for as long as you are taking PrEP. Pregnancy  If you  are premenopausal and you may become pregnant, ask your health care provider about preconception counseling.  If you may become pregnant, take 400 to 800 micrograms (mcg) of folic acid every day.  If you want to prevent pregnancy, talk to your health care provider about birth control (contraception). Osteoporosis and menopause  Osteoporosis is a disease in which the bones lose minerals and strength with aging. This can result in serious bone fractures. Your risk for osteoporosis can be identified using a bone density scan.  If you are 20 years of age or older, or if you are at risk for osteoporosis and fractures, ask your health care provider if you should be screened.  Ask your health care provider whether you should take a calcium or vitamin D supplement to lower your risk for osteoporosis.  Menopause may have certain physical symptoms and risks.  Hormone replacement therapy may reduce some of these symptoms and risks. Talk to your health care provider about whether hormone replacement therapy is right for you. Follow these instructions at home:  Schedule regular health, dental, and eye exams.  Stay current with your immunizations.  Do not use any tobacco products including cigarettes, chewing tobacco, or electronic cigarettes.  If you are pregnant, do not drink alcohol.  If you are breastfeeding, limit how much and how often you drink alcohol.  Limit alcohol intake to no more than 1 drink per day for nonpregnant women. One drink equals 12 ounces of beer, 5 ounces of  wine, or 1 ounces of hard liquor.  Do not use street drugs.  Do not share needles.  Ask your health care provider for help if you need support or information about quitting drugs.  Tell your health care provider if you often feel depressed.  Tell your health care provider if you have ever been abused or do not feel safe at home. This information is not intended to replace advice given to you by your health care provider. Make sure you discuss any questions you have with your health care provider. Document Released: 02/23/2011 Document Revised: 01/16/2016 Document Reviewed: 05/14/2015 Elsevier Interactive Patient Education  [employer].

## 2018-01-18 NOTE — Progress Notes (Signed)
Patient ID: Shannon Soto, female    DOB: 09-Oct-1970, 47 y.o.   MRN: [id number]  PCP: Steele Sizer, MD  No chief complaint on file.   Subjective:  HPI Shannon Soto is a 47 y.o. female presents for employee required wellness visit.  She is followed by Dr. Steele Sizer, MD for primary care needs.   Chronic medical conditions include migraine headaches, hypertension, GERD, controlled type 2 diabetes with neuropathy, raynaud phenomenon, hyperlipidemia, chronic insomnia, and depression.  Current Body mass index is 25.33 kg/m.  Last A1C 5.3 (12/2017). Currently on weekly insulin injections. Diabetes is closely followed and managed by her PCP. She has suffered from hypertension in the past, although reports significant weight loss after undergoing gastric bypass surgery and therefore was able to discontinue antihypertensive therapy for sometime. Over 1 year ago she developed proteinuria and was resumed back on ARB or renal protection. She suffers from Raynauds syndrome and is prescribed Amlodipine for condition. Reports compliance. Uses sodium occasionally. She doesn't routinely monitor blood pressure at home. Reports regular routine physical activity. Denies chest pain, shortness of breath, dizziness, weakness, changes in mood, or worsening of chronic HA. She is scheduled for a CPE with her PCP next month, however was concerned regarding increases in insurance premium and opted to complete wellness exam here today at [place]. Social History   Socioeconomic History  . Marital status: Unknown    Spouse name: Not on file  . Number of children: 3  . Years of education: Not on file  . Highest education level: Associate degree: occupational, Hotel manager, or vocational program  Occupational History  . Not on file  Social Needs  . Financial resource strain: Not on file  . Food insecurity:    Worry: Not on file    Inability: Not on file  . Transportation needs:    Medical: Not on file     Non-medical: Not on file  Tobacco Use  . Smoking status: Never Smoker  . Smokeless tobacco: Never Used  Substance and Sexual Activity  . Alcohol use: No    Alcohol/week: 0.0 oz    Comment: occasional   . Drug use: No  . Sexual activity: Yes  Lifestyle  . Physical activity:    Days per week: Not on file    Minutes per session: Not on file  . Stress: Not on file  Relationships  . Social connections:    Talks on phone: Not on file    Gets together: Not on file    Attends religious service: Not on file    Active member of club or organization: Not on file    Attends meetings of clubs or organizations: Not on file    Relationship status: Not on file  . Intimate partner violence:    Fear of current or ex partner: Not on file    Emotionally abused: Not on file    Physically abused: Not on file    Forced sexual activity: Not on file  Other Topics Concern  . Not on file  Social History Narrative  . Not on file    Family History  Problem Relation Age of Onset  . Diabetes Paternal Uncle   . Stroke Father   . Hypertension Other   . Breast cancer Maternal Grandmother 72     Review of Systems  Constitutional: Negative.   HENT: Negative.   Eyes: Positive for visual disturbance.       Trouble with current corrective lenses prescription   Respiratory:  Negative.   Cardiovascular: Negative.   Gastrointestinal: Negative.   Endocrine:       Suffers from type 2 diabetes   Genitourinary: Negative.   Allergic/Immunologic: Negative.   Neurological: Negative.   Hematological: Negative.   Psychiatric/Behavioral: Negative.     Patient Active Problem List   Diagnosis Date Noted  . Controlled type 2 diabetes mellitus with neuropathy ([place]) 05/12/2016  . Raynaud's phenomenon 05/12/2016  . Intertrigo 09/06/2015  . Migraine headache without aura 02/28/2015  . History of obesity 02/28/2015  . Hyperlipidemia 02/28/2015  . GERD (gastroesophageal reflux disease) 02/28/2015  .  Vitamin D deficiency 02/28/2015  . Chronic constipation 02/28/2015  . Chronic insomnia 02/28/2015  . Peripheral neuropathy 02/28/2015  . Vitiligo 02/28/2015  . Lower leg mass 02/28/2015  . Depression with anxiety 02/28/2015  . History of bariatric surgery 11/05/2014    No Known Allergies  Prior to Admission medications   Medication Sig Start Date End Date Taking? Authorizing Provider  amLODipine (NORVASC) 5 MG tablet Take 1 tablet (5 mg total) by mouth daily. 06/22/17  Yes Sowles, Drue Stager, MD  Calcium Carb-Cholecalciferol (CALCIUM-VITAMIN D3) 500-400 MG-UNIT TABS Take 1 tablet by mouth 3 (three) times daily.   Yes [provider]  cyanocobalamin 500 MCG tablet Take 1 mcg by mouth every morning.   Yes [provider]  ferrous gluconate ([facility] IRON) 240 (27 FE) MG tablet Take 1 tablet (240 mg total) by mouth 3 (three) times daily with meals. 02/28/15  Yes Sowles, Drue Stager, MD  FLUoxetine (PROZAC) 10 MG capsule Take 1 capsule (10 mg total) by mouth daily. 12/29/17  Yes Sowles, Drue Stager, MD  gabapentin (NEURONTIN) 600 MG tablet Take 1.5 tablets (900 mg total) by mouth at bedtime. 12/29/17  Yes Sowles, Drue Stager, MD  Insulin Pen Needle (NOVOTWIST) 32G X 5 MM MISC 1 each daily by Does not apply route. 06/28/17  Yes Sowles, Drue Stager, MD  losartan (COZAAR) 100 MG tablet Take 1 tablet (100 mg total) by mouth daily. 12/29/17  Yes Sowles, Drue Stager, MD  Multiple Vitamin (MULTIVITAMIN WITH MINERALS) TABS tablet Take 1 tablet by mouth 2 (two) times daily.   Yes [provider]  nystatin (NYSTATIN) powder Apply topically 4 (four) times daily. 06/22/17  Yes Sowles, Drue Stager, MD  ondansetron (ZOFRAN ODT) 4 MG disintegrating tablet Take 1 tablet (4 mg total) by mouth every 8 (eight) hours as needed for nausea or vomiting. 12/29/17  Yes Sowles, Drue Stager, MD  polyethylene glycol powder (GLYCOLAX/MIRALAX) powder Take 17 g by mouth 2 (two) times daily as needed. 09/15/16  Yes Sowles, Drue Stager, MD  ranitidine  (ZANTAC) 300 MG tablet Take 1 tablet (300 mg total) by mouth at bedtime. 12/29/17  Yes Sowles, Drue Stager, MD  Semaglutide (OZEMPIC) 1 MG/DOSE SOPN Inject 1 mg into the skin once a week. 12/29/17  Yes Sowles, Drue Stager, MD  zolpidem (AMBIEN) 10 MG tablet Take 1 tablet (10 mg total) by mouth at bedtime. 12/29/17  Yes Sowles, Drue Stager, MD  triamcinolone cream (KENALOG) 0.1 %  09/15/16   [provider]    Past Medical, Surgical Family and Social History reviewed and updated.    Objective:   Today's Vitals   01/18/18 1320  Pulse: 68  Temp: 98.1 F (36.7 C)  SpO2: 100%  Weight: 143 lb (64.9 kg)  Height: 5\' 3"  (1.6 m)    Wt Readings from Last 3 Encounters:  01/18/18 143 lb (64.9 kg)  12/29/17 143 lb 1.6 oz (64.9 kg)  09/21/17 147 lb 12.8 oz (67 kg)  Physical Exam  Constitutional: She is oriented to person, place, and time. She appears well-developed and well-nourished.  HENT:  Head: Normocephalic and atraumatic.  Right Ear: External ear normal.  Left Ear: External ear normal.  Nose: Nose normal.  Mouth/Throat: Oropharynx is clear and moist.  Eyes: Pupils are equal, round, and reactive to light. Conjunctivae and EOM are normal.  Neck: Normal range of motion. Neck supple.  Cardiovascular: Normal rate, regular rhythm, normal heart sounds and intact distal pulses.  No murmur heard. Pulmonary/Chest: Effort normal and breath sounds normal.  Abdominal: Soft. Bowel sounds are normal. She exhibits no distension and no mass. There is no tenderness. There is no rebound and no guarding.  Musculoskeletal: Normal range of motion.  Lymphadenopathy:    She has no cervical adenopathy.  Neurological: She is alert and oriented to person, place, and time. She displays normal reflexes. She exhibits normal muscle tone. Coordination normal.  Skin: Skin is warm and dry. Capillary refill takes less than 2 seconds.  Psychiatric: She has a normal mood and affect. Her behavior is normal. Judgment and  thought content normal.  Vitals reviewed.   Assessment & Plan:  1. Wellness examination Age-appropriate anticipatory guidance provided. UA normal. BP elevated today will defer to PCP to further evaluate and address. Routine screening labs will be deferred to PCP to obtain.    Carroll Sage. Kenton Kingfisher, MSN, FNP-C [facility]  [address] [place], [place] [id number] [phone]

## 2018-01-20 ENCOUNTER — Ambulatory Visit
Admission: RE | Admit: 2018-01-20 | Discharge: 2018-01-20 | Disposition: A | Discharge status: Home / Self Care | Payer: 59 | Source: Ambulatory Visit | Attending: Family Medicine | Admitting: Family Medicine

## 2018-01-20 ENCOUNTER — Encounter: Payer: Self-pay | Admitting: Family Medicine

## 2018-01-20 DIAGNOSIS — Z1231 Encounter for screening mammogram for malignant neoplasm of breast: Secondary | ICD-10-CM | POA: Diagnosis not present

## 2018-02-08 ENCOUNTER — Other Ambulatory Visit
Admission: RE | Admit: 2018-02-08 | Discharge: 2018-02-08 | Disposition: A | Discharge status: Home / Self Care | Payer: 59 | Source: Ambulatory Visit | Attending: Family Medicine | Admitting: Family Medicine

## 2018-02-08 DIAGNOSIS — E538 Deficiency of other specified B group vitamins: Secondary | ICD-10-CM | POA: Insufficient documentation

## 2018-02-08 DIAGNOSIS — I10 Essential (primary) hypertension: Secondary | ICD-10-CM | POA: Diagnosis not present

## 2018-02-08 DIAGNOSIS — E1140 Type 2 diabetes mellitus with diabetic neuropathy, unspecified: Secondary | ICD-10-CM | POA: Diagnosis not present

## 2018-02-08 LAB — CBC WITH DIFFERENTIAL/PLATELET
BASOS PCT: 1 %
Basophils Absolute: 0.1 10*3/uL (ref 0–0.1)
Eosinophils Absolute: 0.1 10*3/uL (ref 0–0.7)
Eosinophils Relative: 2 %
HCT: 36.7 % (ref 35.0–47.0)
HEMOGLOBIN: 12.6 g/dL (ref 12.0–16.0)
LYMPHS PCT: 42 %
Lymphs Abs: 2.9 10*3/uL (ref 1.0–3.6)
MCH: 27.1 pg (ref 26.0–34.0)
MCHC: 34.2 g/dL (ref 32.0–36.0)
MCV: 79.3 fL — AB (ref 80.0–100.0)
MONO ABS: 0.4 10*3/uL (ref 0.2–0.9)
MONOS PCT: 6 %
NEUTROS ABS: 3.5 10*3/uL (ref 1.4–6.5)
NEUTROS PCT: 49 %
Platelets: 339 10*3/uL (ref 150–440)
RBC: 4.63 MIL/uL (ref 3.80–5.20)
RDW: 14.7 % — AB (ref 11.5–14.5)
WBC: 7 10*3/uL (ref 3.6–11.0)

## 2018-02-08 LAB — COMPREHENSIVE METABOLIC PANEL
ALBUMIN: 4.1 g/dL (ref 3.5–5.0)
ALK PHOS: 91 U/L (ref 38–126)
ALT: 19 U/L (ref 14–54)
ANION GAP: 7 (ref 5–15)
AST: 21 U/L (ref 15–41)
BILIRUBIN TOTAL: 0.3 mg/dL (ref 0.3–1.2)
BUN: 16 mg/dL (ref 6–20)
CALCIUM: 8.4 mg/dL — AB (ref 8.9–10.3)
CO2: 28 mmol/L (ref 22–32)
Chloride: 105 mmol/L (ref 101–111)
Creatinine, Ser: 0.52 mg/dL (ref 0.44–1.00)
GFR calc Af Amer: >60 mL/min (ref >60)
GLUCOSE: 80 mg/dL (ref 65–99)
POTASSIUM: 3.3 mmol/L — AB (ref 3.5–5.1)
Sodium: 140 mmol/L (ref 135–145)
Total Protein: 7.1 g/dL (ref 6.5–8.1)

## 2018-02-08 LAB — LIPID PANEL
CHOLESTEROL: 179 mg/dL (ref 0–200)
HDL: 47 mg/dL (ref >40)
LDL Cholesterol: 122 mg/dL — ABNORMAL HIGH (ref 0–99)
Total CHOL/HDL Ratio: 3.8 RATIO
Triglycerides: 49 mg/dL (ref <150)
VLDL: 10 mg/dL (ref 0–40)

## 2018-02-08 LAB — VITAMIN B12: Vitamin B-12: 656 pg/mL (ref 180–914)

## 2018-02-09 LAB — VITAMIN D 25 HYDROXY (VIT D DEFICIENCY, FRACTURES): Vit D, 25-Hydroxy: 26.5 ng/mL — ABNORMAL LOW (ref 30.0–100.0)

## 2018-02-17 ENCOUNTER — Encounter: Payer: 59 | Admitting: Family Medicine

## 2018-05-13 ENCOUNTER — Encounter: Payer: Self-pay | Admitting: Family Medicine

## 2018-05-13 ENCOUNTER — Ambulatory Visit (INDEPENDENT_AMBULATORY_CARE_PROVIDER_SITE_OTHER): Payer: 59 | Admitting: Family Medicine

## 2018-05-13 VITALS — BP 110/84 | HR 85 | Temp 98.1°F | Ht 63.0 in | Wt 141.3 lb

## 2018-05-13 DIAGNOSIS — K2100 Gastro-esophageal reflux disease with esophagitis, without bleeding: Secondary | ICD-10-CM

## 2018-05-13 DIAGNOSIS — F330 Major depressive disorder, recurrent, mild: Secondary | ICD-10-CM

## 2018-05-13 DIAGNOSIS — G629 Polyneuropathy, unspecified: Secondary | ICD-10-CM

## 2018-05-13 DIAGNOSIS — F5104 Psychophysiologic insomnia: Secondary | ICD-10-CM | POA: Diagnosis not present

## 2018-05-13 DIAGNOSIS — L304 Erythema intertrigo: Secondary | ICD-10-CM | POA: Diagnosis not present

## 2018-05-13 DIAGNOSIS — I7300 Raynaud's syndrome without gangrene: Secondary | ICD-10-CM | POA: Diagnosis not present

## 2018-05-13 DIAGNOSIS — E7800 Pure hypercholesterolemia, unspecified: Secondary | ICD-10-CM

## 2018-05-13 DIAGNOSIS — I10 Essential (primary) hypertension: Secondary | ICD-10-CM

## 2018-05-13 DIAGNOSIS — E1140 Type 2 diabetes mellitus with diabetic neuropathy, unspecified: Secondary | ICD-10-CM

## 2018-05-13 DIAGNOSIS — E1121 Type 2 diabetes mellitus with diabetic nephropathy: Secondary | ICD-10-CM | POA: Diagnosis not present

## 2018-05-13 DIAGNOSIS — Z9884 Bariatric surgery status: Secondary | ICD-10-CM

## 2018-05-13 MED ORDER — AMLODIPINE BESYLATE 5 MG PO TABS: 5.0000 mg | ORAL_TABLET | Freq: Every day | ORAL | 3 refills | Status: DC

## 2018-05-13 MED ORDER — LOSARTAN POTASSIUM 100 MG PO TABS: 100.0000 mg | ORAL_TABLET | Freq: Every day | ORAL | 1 refills | Status: DC

## 2018-05-13 MED ORDER — GABAPENTIN 600 MG PO TABS: 900.0000 mg | ORAL_TABLET | Freq: Every day | ORAL | 1 refills | Status: DC

## 2018-05-13 MED ORDER — PRAVASTATIN SODIUM 20 MG PO TABS: 20.0000 mg | ORAL_TABLET | Freq: Every day | ORAL | 1 refills | Status: DC

## 2018-05-13 MED ORDER — FLUOXETINE HCL 10 MG PO CAPS: 10.0000 mg | ORAL_CAPSULE | Freq: Every day | ORAL | 1 refills | Status: DC

## 2018-05-13 MED ORDER — RANITIDINE HCL 300 MG PO TABS: 300.0000 mg | ORAL_TABLET | Freq: Every day | ORAL | 1 refills | Status: DC

## 2018-05-13 MED ORDER — ZOLPIDEM TARTRATE 10 MG PO TABS: 10.0000 mg | ORAL_TABLET | Freq: Every day | ORAL | 0 refills | Status: DC

## 2018-05-13 MED ORDER — SEMAGLUTIDE (1 MG/DOSE) 2 MG/1.5ML ~~LOC~~ SOPN
1.0000 mg | PEN_INJECTOR | SUBCUTANEOUS | 5 refills | Status: DC
Start: 2018-05-13 — End: 2019-01-17

## 2018-05-13 MED ORDER — NYSTATIN 100000 UNIT/GM EX POWD: Freq: Four times a day (QID) | CUTANEOUS | 2 refills | Status: DC

## 2018-05-13 NOTE — Progress Notes (Signed)
Name: Shannon Soto   MRN: [id number]    DOB: 09-Jun-1971   Date:05/13/2018       Progress Note  Subjective  Chief Complaint  Chief Complaint  Patient presents with  . Follow-up    3 months  . Hypertension  . Gastroesophageal Reflux  . Insomnia    HPI  Bariatric Surgery: she had sleeve surgery done by Dr. Hassell Done on March 14th, 2016, she has achieved her goal weight of 145 lbs, but she now wants a normal BMI and has a few pounds to lose. Weight prior to surgery was 242 lbsShe is doing well,hgbA1C started to go up, and we started her on Ozempic 02/2017 she has lost27 lbs since started on medication 02/2017. She has recurrent rash under breast ( but that part is better) and abdominal folds from the significant weight loss and has to use Nystatin prn and medication helps, but symptoms are severe when she does not use powder. She needs a refill at this time.   Hyperlipidemia: she is off Pravastatin since surgery. LDL is okay , had bariatric surgery. Last LDL was at goal for her, however because she has DM, advised to take statin at least once a week   HTN: she is on Norvasc for Raynaud's and Losartan for microalbuminuria, bp is at goal today   Insomnia: taking medication and is tolerating it well, denies side effects, only sleeps about 3-4 hours on medication , she was sleeping 5 hours on 15 mg of Ambien but not FDA approved- because she needs to get up early ( she has a son with special needs and does not have enough hours to sleep during the week days ), she denies naps during the day. She sleeps about 8 hours on weekends, because she does not have to get up early . She goes to bed late on the weekdays, between 11:30 and midnight. She denies grogginess during the day. We decreased dose to 10 mg but she is unable to go down any further because can't sleep without Ambien She is aware of FDA guidelines, and is willing to continue medication as is. Refills sent to pharmacy today   DMII  with peripheral neuropathy and nephropathy: off metformin since surgery - bariatric surgery in 10/2014 , no polyphagia, but has episodes of polydipsia but no polyuria an, but has nocturia times one every night.. On Gabapentin seems to help with neuropathy, taking one and a half 600 mg tablets at night only,She is back on Losartan , for 100 microalbuminuria.  hgba1C 5.5%it was up to 6.2% July 2018 , she was started on Ozempic and hgbA1C is down 5.4%, today it is 5.3% and negative urine micro. Eye exam is up to date  Major depression: chronic symptoms since her son was born. She has been taking Fluoxetine, one daily, occasionally takes one extra pill during the day. No side effects. Denies crying spells, or anhedonia, just worries constantly about her son that has special needs .He is now in 7 th grade but seems overwhelmed with the environment.Stable, she thinks medication helps  Migraines: she has tried multiple medications, but could not tolerate, she take Ibuprofen prn, episodes about 2-3 times months Pain is behind her eyes, throbbing like, associated with nausea at times, photophobia. It can last up to 4 hours with medication ( Tylenol ) she is also taking Goody powders and ibuprofen. She takes zofran prn and does not need refills today . Reminded her again that NSAID's are very dangerous and  can cause GI bleed in patients that had bariatric surgery.  Raynaud's: she states that over the past months she has noticed that fingers on both hands gets white , painful and numb, not sure of the triggers at this time. Cold is make it much worse, she is now on Norvasc, she was seen by Dr. Meda Coffee, tried topical nitroglycerine without improvement of symptoms so she stopped medication She states symptoms are much worse winter months. She did not keep follow up with Dr. Meda Coffee.Today it was a cooler day and she had severe symptoms this am.   Patient Active Problem List   Diagnosis Date Noted  . Controlled  type 2 diabetes mellitus with neuropathy ([place]) 05/12/2016  . Raynaud's phenomenon 05/12/2016  . Intertrigo 09/06/2015  . Migraine headache without aura 02/28/2015  . History of obesity 02/28/2015  . Hyperlipidemia 02/28/2015  . GERD (gastroesophageal reflux disease) 02/28/2015  . Vitamin D deficiency 02/28/2015  . Chronic constipation 02/28/2015  . Chronic insomnia 02/28/2015  . Peripheral neuropathy 02/28/2015  . Vitiligo 02/28/2015  . Lower leg mass 02/28/2015  . Depression with anxiety 02/28/2015  . History of bariatric surgery 11/05/2014    Past Surgical History:  Procedure Laterality Date  . ABDOMINAL HYSTERECTOMY    . APPENDECTOMY    . BREAST SURGERY    . BREATH TEK H PYLORI N/A 08/27/2014   Procedure: BREATH TEK H PYLORI;  Surgeon: Pedro Earls, MD;  Location: [facility];  Service: General;  Laterality: N/A;  . HIATAL HERNIA REPAIR  11/05/2014   Procedure: LAPAROSCOPIC REPAIR OF HIATAL HERNIA;  Surgeon: Johnathan Hausen, MD;  Location: [facility];  Service: General;;  . LAPAROSCOPIC GASTRIC SLEEVE RESECTION N/A 11/05/2014   Procedure: LAPAROSCOPIC GASTRIC SLEEVE RESECTION;  Surgeon: Johnathan Hausen, MD;  Location: [facility];  Service: General;  Laterality: N/A;  . REDUCTION MAMMAPLASTY Bilateral 1997  . UPPER GI ENDOSCOPY  11/05/2014   Procedure: UPPER GI ENDOSCOPY;  Surgeon: Johnathan Hausen, MD;  Location: [facility];  Service: General;;    Family History  Problem Relation Age of Onset  . Diabetes Paternal Uncle   . Stroke Father   . Hypertension Other   . Breast cancer Maternal Grandmother 73    Social History   Socioeconomic History  . Marital status: Unknown    Spouse name: Not on file  . Number of children: 3  . Years of education: Not on file  . Highest education level: Associate degree: occupational, Hotel manager, or vocational program  Occupational History  . Not on file  Social Needs  . Financial resource strain: Not on file  . Food insecurity:    Worry: Not on  file    Inability: Not on file  . Transportation needs:    Medical: Not on file    Non-medical: Not on file  Tobacco Use  . Smoking status: Never Smoker  . Smokeless tobacco: Never Used  Substance and Sexual Activity  . Alcohol use: No    Alcohol/week: 0.0 standard drinks    Comment: occasional   . Drug use: No  . Sexual activity: Yes  Lifestyle  . Physical activity:    Days per week: Not on file    Minutes per session: Not on file  . Stress: Not on file  Relationships  . Social connections:    Talks on phone: Not on file    Gets together: Not on file    Attends religious service: Not on file    Active member of club or  organization: Not on file    Attends meetings of clubs or organizations: Not on file    Relationship status: Not on file  . Intimate partner violence:    Fear of current or ex partner: Not on file    Emotionally abused: Not on file    Physically abused: Not on file    Forced sexual activity: Not on file  Other Topics Concern  . Not on file  Social History Narrative  . Not on file     Current Outpatient Medications:  .  amLODipine (NORVASC) 5 MG tablet, Take 1 tablet (5 mg total) by mouth daily., Disp: 90 tablet, Rfl: 3 .  Calcium Carb-Cholecalciferol (CALCIUM-VITAMIN D3) 500-400 MG-UNIT TABS, Take 1 tablet by mouth 3 (three) times daily., Disp: , Rfl:  .  cyanocobalamin 500 MCG tablet, Take 1 mcg by mouth every morning., Disp: , Rfl:  .  ferrous gluconate ([employer] IRON) 240 (27 FE) MG tablet, Take 1 tablet (240 mg total) by mouth 3 (three) times daily with meals., Disp: 30 tablet, Rfl: 0 .  FLUoxetine (PROZAC) 10 MG capsule, Take 1 capsule (10 mg total) by mouth daily., Disp: 90 capsule, Rfl: 1 .  gabapentin (NEURONTIN) 600 MG tablet, Take 1.5 tablets (900 mg total) by mouth at bedtime., Disp: 135 tablet, Rfl: 1 .  Insulin Pen Needle (NOVOTWIST) 32G X 5 MM MISC, 1 each daily by Does not apply route., Disp: 100 each, Rfl: 2 .  losartan (COZAAR) 100 MG tablet,  Take 1 tablet (100 mg total) by mouth daily., Disp: 90 tablet, Rfl: 1 .  Multiple Vitamin (MULTIVITAMIN WITH MINERALS) TABS tablet, Take 1 tablet by mouth 2 (two) times daily., Disp: , Rfl:  .  nystatin (NYSTATIN) powder, Apply topically 4 (four) times daily., Disp: 60 g, Rfl: 2 .  ondansetron (ZOFRAN ODT) 4 MG disintegrating tablet, Take 1 tablet (4 mg total) by mouth every 8 (eight) hours as needed for nausea or vomiting., Disp: 20 tablet, Rfl: 0 .  polyethylene glycol powder (GLYCOLAX/MIRALAX) powder, Take 17 g by mouth 2 (two) times daily as needed., Disp: 3350 g, Rfl: 1 .  ranitidine (ZANTAC) 300 MG tablet, Take 1 tablet (300 mg total) by mouth at bedtime., Disp: 90 tablet, Rfl: 1 .  Semaglutide (OZEMPIC) 1 MG/DOSE SOPN, Inject 1 mg into the skin once a week., Disp: 3 mL, Rfl: 5 .  triamcinolone cream (KENALOG) 0.1 %, , Disp: , Rfl: 0 .  zolpidem (AMBIEN) 10 MG tablet, Take 1 tablet (10 mg total) by mouth at bedtime., Disp: 90 tablet, Rfl: 0  No Known Allergies  I personally reviewed active problem list, medication list, allergies, family history with the patient/caregiver today.   ROS  Constitutional: Negative for fever or weight change.  Respiratory: Negative for cough and shortness of breath.   Cardiovascular: Negative for chest pain, positive for intermittent  palpitations.  Gastrointestinal: Negative for abdominal pain, no bowel changes.  Musculoskeletal: Negative for gait problem or joint swelling.  Skin: Negative for rash.  Neurological: Negative for dizziness, positive for intermittent  headache.  No other specific complaints in a complete review of systems (except as listed in HPI above).  Objective  Vitals:   05/13/18 1406  BP: 110/84  Pulse: 85  Temp: 98.1 F (36.7 C)  TempSrc: Oral  SpO2: 98%  Weight: 141 lb 4.8 oz (64.1 kg)  Height: 5\' 3"  (1.6 m)    Body mass index is 25.03 kg/m.  Physical Exam  Constitutional: Patient appears well-developed  and  well-nourished. No distress.  HEENT: head atraumatic, normocephalic, pupils equal and reactive to light, neck supple, throat within normal limits Cardiovascular: Normal rate, regular rhythm and normal heart sounds.  No murmur heard. No BLE edema. Pulmonary/Chest: Effort normal and breath sounds normal. No respiratory distress. Abdominal: Soft.  There is no tenderness. Psychiatric: Patient has a normal mood and affect. behavior is normal. Judgment and thought content normal.  PHQ2/9: Depression screen [facility] 2/9 05/13/2018 12/29/2017 09/21/2017 12/08/2016 05/12/2016  Decreased Interest - 0 1 1 0  Down, Depressed, Hopeless 1 1 1  0 0  PHQ - 2 Score 1 1 2 1  0  Altered sleeping 3 1 1  - -  Tired, decreased energy 1 1 1  - -  Change in appetite 0 0 1 - -  Feeling bad or failure about yourself  0 0 0 - -  Trouble concentrating 0 0 0 - -  Moving slowly or fidgety/restless 0 0 0 - -  Suicidal thoughts 0 0 0 - -  PHQ-9 Score 5 3 5  - -  Difficult doing work/chores Not difficult at all Somewhat difficult Somewhat difficult - -     Fall Risk: Fall Risk  05/13/2018 12/29/2017 09/21/2017 06/22/2017 12/08/2016  Falls in the past year? No No No No No     Functional Status Survey: Is the patient deaf or have difficulty hearing?: No Does the patient have difficulty seeing, even when wearing glasses/contacts?: No Does the patient have difficulty concentrating, remembering, or making decisions?: No Does the patient have difficulty walking or climbing stairs?: No Does the patient have difficulty dressing or bathing?: No Does the patient have difficulty doing errands alone such as visiting a doctor's office or shopping?: No    Assessment & Plan  1. Hypertension, benign  - amLODipine (NORVASC) 5 MG tablet; Take 1 tablet (5 mg total) by mouth daily.  Dispense: 90 tablet; Refill: 3  2. Raynaud's disease without gangrene  - amLODipine (NORVASC) 5 MG tablet; Take 1 tablet (5 mg total) by mouth daily.  Dispense:  90 tablet; Refill: 3  3. Mild recurrent major depression (HCC)  - FLUoxetine (PROZAC) 10 MG capsule; Take 1 capsule (10 mg total) by mouth daily.  Dispense: 90 capsule; Refill: 1  4. Peripheral polyneuropathy  - gabapentin (NEURONTIN) 600 MG tablet; Take 1.5 tablets (900 mg total) by mouth at bedtime.  Dispense: 135 tablet; Refill: 1  5. Controlled type 2 diabetes mellitus with neuropathy (HCC)  - gabapentin (NEURONTIN) 600 MG tablet; Take 1.5 tablets (900 mg total) by mouth at bedtime.  Dispense: 135 tablet; Refill: 1 - Semaglutide, 1 MG/DOSE, (OZEMPIC, 1 MG/DOSE,) 2 MG/1.5ML SOPN; Inject 1 mg into the skin once a week.  Dispense: 3 mL; Refill: 5  6. Type 2 diabetes with nephropathy (HCC)  - losartan (COZAAR) 100 MG tablet; Take 1 tablet (100 mg total) by mouth daily.  Dispense: 90 tablet; Refill: 1  7. Chronic insomnia  - zolpidem (AMBIEN) 10 MG tablet; Take 1 tablet (10 mg total) by mouth at bedtime.  Dispense: 90 tablet; Refill: 0  8. Intertrigo  - nystatin (NYSTATIN) powder; Apply topically 4 (four) times daily.  Dispense: 60 g; Refill: 2  9. GERD with esophagitis  - ranitidine (ZANTAC) 300 MG tablet; Take 1 tablet (300 mg total) by mouth at bedtime.  Dispense: 90 tablet; Refill: 1  10. History of bariatric surgery  She has intermittent nausea when she over eats and we will fill zofran when she needs to

## 2018-06-24 ENCOUNTER — Encounter

## 2018-06-24 ENCOUNTER — Ambulatory Visit: Payer: 59 | Admitting: Family Medicine

## 2018-07-27 ENCOUNTER — Telehealth: Payer: 59 | Admitting: Family

## 2018-07-27 ENCOUNTER — Encounter: Payer: Self-pay | Admitting: Physician Assistant

## 2018-07-27 ENCOUNTER — Ambulatory Visit (INDEPENDENT_AMBULATORY_CARE_PROVIDER_SITE_OTHER): Payer: Self-pay | Admitting: Physician Assistant

## 2018-07-27 VITALS — BP 160/94 | HR 75 | Temp 98.3°F | Wt 142.0 lb

## 2018-07-27 DIAGNOSIS — B9789 Other viral agents as the cause of diseases classified elsewhere: Secondary | ICD-10-CM | POA: Diagnosis not present

## 2018-07-27 DIAGNOSIS — J069 Acute upper respiratory infection, unspecified: Secondary | ICD-10-CM

## 2018-07-27 DIAGNOSIS — R0981 Nasal congestion: Secondary | ICD-10-CM

## 2018-07-27 MED ORDER — OXYMETAZOLINE HCL 0.05 % NA SOLN: 2.0000 | Freq: Every day | NASAL | 0 refills | Status: DC

## 2018-07-27 MED ORDER — BENZONATATE 100 MG PO CAPS: 100.0000 mg | ORAL_CAPSULE | Freq: Three times a day (TID) | ORAL | 0 refills | Status: DC | PRN

## 2018-07-27 MED ORDER — PSEUDOEPH-BROMPHEN-DM 30-2-10 MG/5ML PO SYRP: 5.0000 mL | ORAL_SOLUTION | Freq: Four times a day (QID) | ORAL | 0 refills | Status: DC | PRN

## 2018-07-27 NOTE — Progress Notes (Signed)
We are sorry you are not feeling well.  Here is how we plan to help!  Based on what you have shared with me, it looks like you may have a viral upper respiratory infection or a "common cold".  Colds are caused by a large number of viruses; however, rhinovirus is the most common cause.   Symptoms of the common cold vary from person to person, with common symptoms including sore throat, cough, and malaise.  A low-grade fever of 100.4 may present, but is often uncommon.  Symptoms vary however, and are closely related to a person's age or underlying illnesses.  The most common symptoms associated with the common cold are nasal discharge or congestion, cough, sneezing, headache and pressure in the ears and face.  Cold symptoms usually persist for about 3 to 10 days, but can last up to 2 weeks.  It is important to know that colds do not cause serious illness or complications in most cases.    The common cold is transmitted from person to person, with the most common method of transmission being a person's hands.  The virus is able to live on the skin and can infect other persons for up to 2 hours after direct contact.  Also, colds are transmitted when someone coughs or sneezes; thus, it is important to cover the mouth to reduce this risk.  To keep the spread of the common cold at [place], good hand hygiene is very important.  This is an infection that is most likely caused by a virus. There are no specific treatments for the common cold other than to help you with the symptoms until the infection runs its course.    For nasal congestion, you may use an oral decongestants such as Mucinex D or if you have glaucoma or high blood pressure use plain Mucinex.  Saline nasal spray or nasal drops can help and can safely be used as often as needed for congestion.   If you do not have a history of heart disease, hypertension, diabetes or thyroid disease, prostate/bladder issues or glaucoma, you may also use Sudafed to treat  nasal congestion.  It is highly recommended that you consult with a pharmacist or your primary care physician to ensure this medication is safe for you to take.     If you have a cough, you may use cough suppressants such as Delsym and Robitussin.  If you have glaucoma or high blood pressure, you can also use Coricidin HBP.   For cough I have prescribed for you A prescription cough medication called Tessalon Perles 100 mg. You may take 1-2 capsules every 8 hours as needed for cough.  If you have a sore or scratchy throat, use a saltwater gargle-  to  teaspoon of salt dissolved in a 4-ounce to 8-ounce glass of warm water.  Gargle the solution for approximately 15-30 seconds and then spit.  It is important not to swallow the solution.  You can also use throat lozenges/cough drops and Chloraseptic spray to help with throat pain or discomfort.  Warm or cold liquids can also be helpful in relieving throat pain.  For headache, pain or general discomfort, you can use Ibuprofen or Tylenol as directed.   Some authorities believe that zinc sprays or the use of Echinacea may shorten the course of your symptoms.   HOME CARE . Only take medications as instructed by your medical team. . Be sure to drink plenty of fluids. Water is fine as well as fruit  juices, sodas and electrolyte beverages. You may want to stay away from caffeine or alcohol. If you are nauseated, try taking small sips of liquids. How do you know if you are getting enough fluid? Your urine should be a pale yellow or almost colorless. . Get rest. . Taking a steamy shower or using a humidifier may help nasal congestion and ease sore throat pain. You can place a towel over your head and breathe in the steam from hot water coming from a faucet. . Using a saline nasal spray works much the same way. . Cough drops, hard candies and sore throat lozenges may ease your cough. . Avoid close contacts especially the very young and the elderly . Cover your  mouth if you cough or sneeze . Always remember to wash your hands.   GET HELP RIGHT AWAY IF: . You develop worsening fever. . If your symptoms do not improve within 10 days . You develop yellow or green discharge from your nose over 3 days. . You have coughing fits . You develop a severe head ache or visual changes. . You develop shortness of breath, difficulty breathing or start having chest pain . Your symptoms persist after you have completed your treatment plan  MAKE SURE YOU   Understand these instructions.  Will watch your condition.  Will get help right away if you are not doing well or get worse.  Your e-visit answers were reviewed by a board certified advanced clinical practitioner to complete your personal care plan. Depending upon the condition, your plan could have included both over the counter or prescription medications. Please review your pharmacy choice. If there is a problem, you may call our nursing hot line at and have the prescription routed to another pharmacy. Your safety is important to [place]. If you have drug allergies check your prescription carefully.   You can use MyChart to ask questions about today's visit, request a non-urgent call back, or ask for a work or school excuse for 24 hours related to this e-Visit. If it has been greater than 24 hours you will need to follow up with your provider, or enter a new e-Visit to address those concerns. You will get an e-mail in the next two days asking about your experience.  I hope that your e-visit has been valuable and will speed your recovery. Thank you for using e-visits.

## 2018-07-27 NOTE — Patient Instructions (Addendum)
Thank you for choosing [facility] for your health care needs today.  You have been diagnosed with an upper respiratory infection (a cold).  Increase fluids; water, Gatorade, or hot tea with lemon or honey. Rest.  Recommend you use humidifier in bedroom. Take hot/streamy showers to help with nasal congestion.  Use over the counter Afrin nasal spray. Will help with congestion. 2 sprays in each nostril once a day.  You have been prescribed a cough syrup that has a decongestant and will help with nasal congestion. May raise blood pressure. Continue to take blood pressure medication.  Follow-up with family physician or at [facility] in one week if still having symptoms, sooner with any worsening symptoms.  Upper Respiratory Infection, Adult Most upper respiratory infections (URIs) are caused by a virus. A URI affects the nose, throat, and upper air passages. The most common type of URI is often called "the common cold." Follow these instructions at home:  Take medicines only as told by your doctor.  Gargle warm saltwater or take cough drops to comfort your throat as told by your doctor.  Use a warm mist humidifier or inhale steam from a shower to increase air moisture. This may make it easier to breathe.  Drink enough fluid to keep your pee (urine) clear or pale yellow.  Eat soups and other clear broths.  Have a healthy diet.  Rest as needed.  Go back to work when your fever is gone or your doctor says it is okay. ? You may need to stay home longer to avoid giving your URI to others. ? You can also wear a face mask and wash your hands often to prevent spread of the virus.  Use your inhaler more if you have asthma.  Do not use any tobacco products, including cigarettes, chewing tobacco, or electronic cigarettes. If you need help quitting, ask your doctor. Contact a doctor if:  You are getting worse, not better.  Your symptoms are not helped by medicine.  You have chills.  You  are getting more short of breath.  You have brown or red mucus.  You have yellow or brown discharge from your nose.  You have pain in your face, especially when you bend forward.  You have a fever.  You have puffy (swollen) neck glands.  You have pain while swallowing.  You have white areas in the back of your throat. Get help right away if:  You have very bad or constant: ? Headache. ? Ear pain. ? Pain in your forehead, behind your eyes, and over your cheekbones (sinus pain). ? Chest pain.  You have long-lasting (chronic) lung disease and any of the following: ? Wheezing. ? Long-lasting cough. ? Coughing up blood. ? A change in your usual mucus.  You have a stiff neck.  You have changes in your: ? Vision. ? Hearing. ? Thinking. ? Mood. This information is not intended to replace advice given to you by your health care provider. Make sure you discuss any questions you have with your health care provider. Document Released: 01/27/2008 Document Revised: 04/12/2016 Document Reviewed: 11/15/2013 Elsevier Interactive Patient Education  2018 [employer].

## 2018-07-27 NOTE — Progress Notes (Signed)
Patient ID: Shannon Soto DOB: 02-24-1971 AGE: 47 y.o. MRN: [id number]   PCP: Steele Sizer, MD   Chief Complaint:  Chief Complaint  Patient presents with  . sinus pressure and drainage     Subjective:    HPI:  Shannon Soto is a 47 y.o. female presents for evaluation  Chief Complaint  Patient presents with  . sinus pressure and drainage    47 year old female presents with three day history of URI symptoms. Began Monday 07/25/2018 late evening with raw/scratchy throat. Took over the counter Dayquil and went to bed. Patient states following day developed rhinorrhea, nasal congestion, postnasal drip, and cough. Cough from sore throat and PND. Has taken OTC Mucinex-D this morning with no improvement. Primary complaint swollen sensation of nose. States resting glasses on nose causes pain/discomfort. Denies fever, chills, headache, ear pain, ear pressure/popping/crackling, maxillary sinus pain, chest congestion, wheezing, SOB.  Patient states when she develops these symptoms she typically responds best to Augmentin (also Diflucan for secondary vaginal yeast infection). Patient reports an intolerance to nasal sprays. States they cause nasal burning and do not help with symptoms. Patient states true for all nasal sprays, not just steroid such as Flonase.  Patient completed e-Visit today with Evelina Dun FNP. Diagnosed with viral URI with cough. Patient reported two day history of sore throat, nasal congestion/pain, and cough. Prescribed Tessalon Perles and advised to use an over the counter oral decongestant such as Mucinex-D.  Patient with type 2 DM. Controlled on Semaglutide (Ozempic injection) and Neurontin (for neuropathy). Last A1C 5.3.  Patient with diagnosis of HTN. Last blood pressure reading by PCP, Dr. Steele Sizer MD with [facility]. Was 110/84 on 05/13/2018. Controlled on Norvasc and Losartan. Blood pressure elevated in office today, 160/94,  patient states due to pain, has happened to her before.  A complete, at least 10 system review of symptoms was performed, pertinent positives and negatives as mentioned in HPI, otherwise negative.  The following portions of the patient's history were reviewed and updated as appropriate: allergies, current medications and past medical history.  Patient Active Problem List   Diagnosis Date Noted  . Controlled type 2 diabetes mellitus with neuropathy ([place]) 05/12/2016  . Raynaud's phenomenon 05/12/2016  . Intertrigo 09/06/2015  . Migraine headache without aura 02/28/2015  . History of obesity 02/28/2015  . Hyperlipidemia 02/28/2015  . GERD (gastroesophageal reflux disease) 02/28/2015  . Vitamin D deficiency 02/28/2015  . Chronic constipation 02/28/2015  . Chronic insomnia 02/28/2015  . Peripheral neuropathy 02/28/2015  . Vitiligo 02/28/2015  . Lower leg mass 02/28/2015  . Depression with anxiety 02/28/2015  . History of bariatric surgery 11/05/2014    No Known Allergies  Current Outpatient Medications on File Prior to Visit  Medication Sig Dispense Refill  . amLODipine (NORVASC) 5 MG tablet Take 1 tablet (5 mg total) by mouth daily. 90 tablet 3  . benzonatate (TESSALON PERLES) 100 MG capsule Take 1 capsule (100 mg total) by mouth 3 (three) times daily as needed. 20 capsule 0  . Calcium Carb-Cholecalciferol (CALCIUM-VITAMIN D3) 500-400 MG-UNIT TABS Take 1 tablet by mouth 3 (three) times daily.    . cyanocobalamin 500 MCG tablet Take 1 mcg by mouth every morning.    . ferrous gluconate ([employer] IRON) 240 (27 FE) MG tablet Take 1 tablet (240 mg total) by mouth 3 (three) times daily with meals. 30 tablet 0  . FLUoxetine (PROZAC) 10 MG capsule Take 1 capsule (10 mg total) by mouth  daily. 90 capsule 1  . gabapentin (NEURONTIN) 600 MG tablet Take 1.5 tablets (900 mg total) by mouth at bedtime. 135 tablet 1  . Insulin Pen Needle (NOVOTWIST) 32G X 5 MM MISC 1 each daily by Does not apply route.  100 each 2  . losartan (COZAAR) 100 MG tablet Take 1 tablet (100 mg total) by mouth daily. 90 tablet 1  . Multiple Vitamin (MULTIVITAMIN WITH MINERALS) TABS tablet Take 1 tablet by mouth 2 (two) times daily.    Marland Kitchen nystatin (NYSTATIN) powder Apply topically 4 (four) times daily. 60 g 2  . ondansetron (ZOFRAN ODT) 4 MG disintegrating tablet Take 1 tablet (4 mg total) by mouth every 8 (eight) hours as needed for nausea or vomiting. 20 tablet 0  . polyethylene glycol powder (GLYCOLAX/MIRALAX) powder Take 17 g by mouth 2 (two) times daily as needed. 3350 g 1  . pravastatin (PRAVACHOL) 20 MG tablet Take 1 tablet (20 mg total) by mouth daily. 90 tablet 1  . ranitidine (ZANTAC) 300 MG tablet Take 1 tablet (300 mg total) by mouth at bedtime. 90 tablet 1  . Semaglutide, 1 MG/DOSE, (OZEMPIC, 1 MG/DOSE,) 2 MG/1.5ML SOPN Inject 1 mg into the skin once a week. 3 mL 5  . triamcinolone cream (KENALOG) 0.1 %   0  . zolpidem (AMBIEN) 10 MG tablet Take 1 tablet (10 mg total) by mouth at bedtime. 90 tablet 0   No current facility-administered medications on file prior to visit.        Objective:   Vitals:   07/27/18 1229  BP: (!) 160/94  Pulse: 75  Temp: 98.3 F (36.8 C)  SpO2: 100%     Wt Readings from Last 3 Encounters:  07/27/18 142 lb (64.4 kg)  05/13/18 141 lb 4.8 oz (64.1 kg)  01/18/18 143 lb (64.9 kg)    Physical Exam:   General Appearance:  Good self-historian, cooperative, appears stated age. In no acute distress. Afebrile.  Head:  Normocephalic, without obvious abnormality, atraumatic  Eyes:  PERRL, conjunctiva/corneas clear, EOM's intact  Ears:  Normal TM's and external ear canals, both ears  Nose: Nares normal, septum midline. Nasal mucosa with minimal edema and clear rhinorrhea. No visible facial swelling or erythema. Mild tenderness with pinching/palpation of lateral aspects of nose. No tenderness with palpation over frontal or maxillary sinuses.  Throat: Lips, mucosa, and tongue  normal; teeth and gums normal. Throat reveals very faint erythema. No tonsillar enlargement or exudate.  Neck: Supple, symmetrical, trachea midline, no adenopathy  Lungs:   Clear to auscultation bilaterally, respirations unlabored  Heart:  Regular rate and rhythm, S1 and S2 normal, no murmur, rub, or gallop  Extremities: Extremities normal, atraumatic, no cyanosis or edema  Pulses: 2+ and symmetric  Skin: Skin color, texture, turgor normal, no rashes or lesions  Lymph nodes: Cervical, supraclavicular, and axillary nodes normal  Neurologic: Normal    Assessment & Plan:    Exam findings, diagnosis etiology and medication use and indications reviewed with patient. Follow-Up and discharge instructions provided. No emergent/urgent issues found on exam.  Patient education was provided.   Patient verbalized understanding of information provided and agrees with plan of care (POC), all questions answered. The patient is advised to call or return to clinic if condition does not see an improvement in symptoms, or to seek the care of the closest emergency department if condition worsens with the below plan.   1. Upper respiratory tract infection, unspecified type  - brompheniramine-pseudoephedrine-DM 30-2-10 MG/5ML syrup;  Take 5 mLs by mouth 4 (four) times daily as needed.  Dispense: 120 mL; Refill: 0  2. Nasal congestion  - oxymetazoline (AFRIN NASAL SPRAY) 0.05 % nasal spray; Place 2 sprays into both nostrils daily.  Dispense: 30 mL; Refill: 0  Patient with 3 day history of URI symptoms. Sore throat, to nasal congestion, to cough. Stable vital signs (elevated BP). Not acutely ill. Believe patient has self-limited viral URI. Advised increase fluids, rest, saline nasal spray, humidifier in bedroom, and hot/steamy showers. Prescribed Bromfed cough syrup; discussed concern with elevated BP, patient will monitor. Prescribed Afrin nasal spray; requested patient give nasal sprays a second chance, should  not burn and will provide almost immediate, significant nasal congestion relief. Advised patient f/u with PCP or at [facility] in one week if symptoms not resolving, sooner with any worsening symptoms.   Darlin Priestly, MHS, PA-C Montey Hora, MHS, PA-C Advanced Practice Provider [facility]  [address], [facility], [place], [place] [address] (p):  [phone] [email or web address]

## 2018-07-29 ENCOUNTER — Telehealth: Payer: Self-pay | Admitting: Emergency Medicine

## 2018-07-29 NOTE — Telephone Encounter (Signed)
Left message for follow up call from visit with [facility].

## 2018-07-31 ENCOUNTER — Telehealth: Payer: 59 | Admitting: Family

## 2018-07-31 DIAGNOSIS — B373 Candidiasis of vulva and vagina: Secondary | ICD-10-CM

## 2018-07-31 DIAGNOSIS — B9689 Other specified bacterial agents as the cause of diseases classified elsewhere: Secondary | ICD-10-CM

## 2018-07-31 DIAGNOSIS — B3731 Acute candidiasis of vulva and vagina: Secondary | ICD-10-CM

## 2018-07-31 DIAGNOSIS — J329 Chronic sinusitis, unspecified: Secondary | ICD-10-CM | POA: Diagnosis not present

## 2018-07-31 MED ORDER — AMOXICILLIN-POT CLAVULANATE 875-125 MG PO TABS: 1.0000 | ORAL_TABLET | Freq: Two times a day (BID) | ORAL | 0 refills | Status: AC

## 2018-07-31 MED ORDER — FLUCONAZOLE 150 MG PO TABS: 150.0000 mg | ORAL_TABLET | Freq: Once | ORAL | 0 refills | Status: AC

## 2018-07-31 NOTE — Progress Notes (Signed)
Thank you for the details you included in the comment boxes. Those details are very helpful in determining the best course of treatment for you and help [place] to provide the best care. I sent some of the Diflucan 150mg  as well. See plan below.  We are sorry that you are not feeling well.  Here is how we plan to help!  Based on what you have shared with me it looks like you have sinusitis.  Sinusitis is inflammation and infection in the sinus cavities of the head.  Based on your presentation I believe you most likely have Acute Bacterial Sinusitis.  This is an infection caused by bacteria and is treated with antibiotics. I have prescribed Augmentin 875mg /125mg  one tablet twice daily with food, for 7 days. You may use an oral decongestant such as Mucinex D or if you have glaucoma or high blood pressure use plain Mucinex. Saline nasal spray help and can safely be used as often as needed for congestion.  If you develop worsening sinus pain, fever or notice severe headache and vision changes, or if symptoms are not better after completion of antibiotic, please schedule an appointment with a health care provider.    Sinus infections are not as easily transmitted as other respiratory infection, however we still recommend that you avoid close contact with loved ones, especially the very young and elderly.  Remember to wash your hands thoroughly throughout the day as this is the number one way to prevent the spread of infection!  Home Care:  Only take medications as instructed by your medical team.  Complete the entire course of an antibiotic.  Do not take these medications with alcohol.  A steam or ultrasonic humidifier can help congestion.  You can place a towel over your head and breathe in the steam from hot water coming from a faucet.  Avoid close contacts especially the very young and the elderly.  Cover your mouth when you cough or sneeze.  Always remember to wash your hands.  Get Help Right Away  If:  You develop worsening fever or sinus pain.  You develop a severe head ache or visual changes.  Your symptoms persist after you have completed your treatment plan.  Make sure you  Understand these instructions.  Will watch your condition.  Will get help right away if you are not doing well or get worse.  Your e-visit answers were reviewed by a board certified advanced clinical practitioner to complete your personal care plan.  Depending on the condition, your plan could have included both over the counter or prescription medications.  If there is a problem please reply  once you have received a response from your provider.  Your safety is important to [place].  If you have drug allergies check your prescription carefully.    You can use MyChart to ask questions about today's visit, request a non-urgent call back, or ask for a work or school excuse for 24 hours related to this e-Visit. If it has been greater than 24 hours you will need to follow up with your provider, or enter a new e-Visit to address those concerns.  You will get an e-mail in the next two days asking about your experience.  I hope that your e-visit has been valuable and will speed your recovery. Thank you for using e-visits.

## 2018-09-13 ENCOUNTER — Ambulatory Visit (INDEPENDENT_AMBULATORY_CARE_PROVIDER_SITE_OTHER): Payer: No Typology Code available for payment source | Admitting: Family Medicine

## 2018-09-13 ENCOUNTER — Encounter: Payer: Self-pay | Admitting: Family Medicine

## 2018-09-13 VITALS — BP 140/90 | HR 75 | Temp 98.2°F | Resp 16 | Ht 63.0 in | Wt 142.6 lb

## 2018-09-13 DIAGNOSIS — L308 Other specified dermatitis: Secondary | ICD-10-CM

## 2018-09-13 DIAGNOSIS — G43009 Migraine without aura, not intractable, without status migrainosus: Secondary | ICD-10-CM

## 2018-09-13 DIAGNOSIS — E1140 Type 2 diabetes mellitus with diabetic neuropathy, unspecified: Secondary | ICD-10-CM

## 2018-09-13 DIAGNOSIS — E1121 Type 2 diabetes mellitus with diabetic nephropathy: Secondary | ICD-10-CM

## 2018-09-13 DIAGNOSIS — F5104 Psychophysiologic insomnia: Secondary | ICD-10-CM | POA: Diagnosis not present

## 2018-09-13 DIAGNOSIS — Z9884 Bariatric surgery status: Secondary | ICD-10-CM

## 2018-09-13 DIAGNOSIS — K2100 Gastro-esophageal reflux disease with esophagitis, without bleeding: Secondary | ICD-10-CM

## 2018-09-13 DIAGNOSIS — I7300 Raynaud's syndrome without gangrene: Secondary | ICD-10-CM | POA: Diagnosis not present

## 2018-09-13 DIAGNOSIS — L304 Erythema intertrigo: Secondary | ICD-10-CM

## 2018-09-13 DIAGNOSIS — F330 Major depressive disorder, recurrent, mild: Secondary | ICD-10-CM

## 2018-09-13 DIAGNOSIS — I10 Essential (primary) hypertension: Secondary | ICD-10-CM | POA: Diagnosis not present

## 2018-09-13 LAB — POCT GLYCOSYLATED HEMOGLOBIN (HGB A1C): HEMOGLOBIN A1C: 5.2 % (ref 4.0–5.6)

## 2018-09-13 MED ORDER — ONDANSETRON 4 MG PO TBDP: 4.0000 mg | ORAL_TABLET | Freq: Three times a day (TID) | ORAL | 0 refills | Status: DC | PRN

## 2018-09-13 MED ORDER — TRIAMCINOLONE ACETONIDE 0.1 % EX CREA: TOPICAL_CREAM | Freq: Two times a day (BID) | CUTANEOUS | 0 refills | Status: DC

## 2018-09-13 MED ORDER — AMLODIPINE BESYLATE 10 MG PO TABS: 10.0000 mg | ORAL_TABLET | Freq: Every day | ORAL | 0 refills | Status: DC

## 2018-09-13 MED ORDER — ZOLPIDEM TARTRATE 10 MG PO TABS: 10.0000 mg | ORAL_TABLET | Freq: Every day | ORAL | 0 refills | Status: DC

## 2018-09-13 MED ORDER — OMEPRAZOLE 20 MG PO CPDR: 20.0000 mg | DELAYED_RELEASE_CAPSULE | Freq: Every day | ORAL | 0 refills | Status: DC

## 2018-09-13 NOTE — Progress Notes (Signed)
Name: Shannon Soto   MRN: [id number]    DOB: March 26, 1971   Date:09/13/2018       Progress Note  Subjective  Chief Complaint  Chief Complaint  Patient presents with  . Diabetes  . Anxiety  . Depression  . Hyperlipidemia    HPI  Bariatric Surgery: she had sleeve surgery done by Dr. Hassell Done on March 14th, 2016, she has achieved her goal weight of145 lbs, today weight is down to 142.6 lbs  but she now wants a normal BMI and has a few pounds to lose.Weight prior to surgery was 242 lbsShe is doing well,hgbA1C started to go up, and we started her on Ozempic 02/2017 she has lost29.5 lbs since started on medication 02/2017. She has recurrent rash under breast( but that part is better)and abdominal folds from the significant weight loss and has to use Nystatin prn and medication helps,  She has a routine follow up with Dr. Hassell Done and she will discuss repair with plastic surgery to control symptoms   Hyperlipidemia: she is off Pravastatin since surgery. LDL is okay , had bariatric surgery. Last LDL was at goal for her, however because she has DM, advised to take statin at least once a week   HTN: she is on Norvasc for Raynaud's and Losartan for microalbuminuria, bp is elevated again today and it was high during urgent care visit, we will increase dose of Norvasc today   Insomnia: taking medication and is tolerating it well, she has been on medication for many years, she used to take high dose, now she is trying to make it last and at times takes half of one with a gabapentin and is going to bed a little earlier - around 10 pm and is able to sleep 5-6 . She denies grogginess during the day.   DMII with peripheral neuropathy and nephropathy: off metformin since surgery - bariatric surgery in 10/2014 , no polyphagia, but has episodes of polydipsia but no polyuria an, but has nocturia times one every night.. On Gabapentin seems to help with neuropathy, taking one and a half 600 mg tablets at  night only,She is back on Losartan , for 100 microalbuminuria. hgba1C 5.5%it was up to 6.2% July 2018 , she was started on Ozempic and hgbA1C is down 5.4%, today it is 5.2%  negative urine micro. Foot exam is up to date.   Major depression: chronic symptoms since her son was born. She has been taking Fluoxetine, one daily, occasionally takes one extra pill during the day. No side effects. Denies crying spells, or anhedonia, just worries constantly about her son that has special needs .He is now in 7 th grade but seems overwhelmed with the environment, now also worried about middle son that is starting to drive .Stable, she thinks medication helps  Migraines: she has tried multiple medications, but could not tolerate,  episodes about 2-3 times months Pain is behind her eyes, throbbing like, associated with nausea when she has a severe episode and photophobia. It can last up to 4 hours with medication ( Tylenol ) she is also takingGoody powders and ibuprofen.  Reminded her again that NSAID's are very dangerous and can cause GI bleed in patients that had bariatric surgery.  Raynaud's: symptoms of  fingers on both hands gets white , painful and numb, not sure of the triggers at this time. Cold is make it much worse, she is now on Norvasc and we will increase the dose today , she was seen by  Dr. Meda Coffee, tried topical nitroglycerine without improvement of symptoms so she stopped medication She states symptoms are much worse winter months. She did not keep follow up with Dr. Meda Coffee.   Patient Active Problem List   Diagnosis Date Noted  . Controlled type 2 diabetes mellitus with neuropathy ([place]) 05/12/2016  . Raynaud's phenomenon 05/12/2016  . Intertrigo 09/06/2015  . Migraine headache without aura 02/28/2015  . History of obesity 02/28/2015  . Hyperlipidemia 02/28/2015  . GERD (gastroesophageal reflux disease) 02/28/2015  . Vitamin D deficiency 02/28/2015  . Chronic constipation 02/28/2015  .  Chronic insomnia 02/28/2015  . Peripheral neuropathy 02/28/2015  . Vitiligo 02/28/2015  . Lower leg mass 02/28/2015  . Depression with anxiety 02/28/2015  . History of bariatric surgery 11/05/2014    Past Surgical History:  Procedure Laterality Date  . ABDOMINAL HYSTERECTOMY    . APPENDECTOMY    . BREAST SURGERY    . BREATH TEK H PYLORI N/A 08/27/2014   Procedure: BREATH TEK H PYLORI;  Surgeon: Pedro Earls, MD;  Location: [facility];  Service: General;  Laterality: N/A;  . HIATAL HERNIA REPAIR  11/05/2014   Procedure: LAPAROSCOPIC REPAIR OF HIATAL HERNIA;  Surgeon: Johnathan Hausen, MD;  Location: [facility];  Service: General;;  . LAPAROSCOPIC GASTRIC SLEEVE RESECTION N/A 11/05/2014   Procedure: LAPAROSCOPIC GASTRIC SLEEVE RESECTION;  Surgeon: Johnathan Hausen, MD;  Location: [facility];  Service: General;  Laterality: N/A;  . REDUCTION MAMMAPLASTY Bilateral 1997  . UPPER GI ENDOSCOPY  11/05/2014   Procedure: UPPER GI ENDOSCOPY;  Surgeon: Johnathan Hausen, MD;  Location: [facility];  Service: General;;    Family History  Problem Relation Age of Onset  . Diabetes Paternal Uncle   . Stroke Father   . Hypertension Other   . Breast cancer Maternal Grandmother 57    Social History   Socioeconomic History  . Marital status: Single    Spouse name: Not on file  . Number of children: 3  . Years of education: Not on file  . Highest education level: Associate degree: occupational, Hotel manager, or vocational program  Occupational History  . Not on file  Social Needs  . Financial resource strain: Not hard at all  . Food insecurity:    Worry: Never true    Inability: Never true  . Transportation needs:    Medical: No    Non-medical: No  Tobacco Use  . Smoking status: Never Smoker  . Smokeless tobacco: Never Used  Substance and Sexual Activity  . Alcohol use: No    Alcohol/week: 0.0 standard drinks    Comment: occasional   . Drug use: No  . Sexual activity: Yes  Lifestyle  . Physical  activity:    Days per week: 2 days    Minutes per session: 20 min  . Stress: To some extent  Relationships  . Social connections:    Talks on phone: Not on file    Gets together: Not on file    Attends religious service: Not on file    Active member of club or organization: Not on file    Attends meetings of clubs or organizations: Not on file    Relationship status: Not on file  . Intimate partner violence:    Fear of current or ex partner: No    Emotionally abused: No    Physically abused: No    Forced sexual activity: No  Other Topics Concern  . Not on file  Social History Narrative  . Not on  file     Current Outpatient Medications:  .  amLODipine (NORVASC) 10 MG tablet, Take 1 tablet (10 mg total) by mouth daily., Disp: 90 tablet, Rfl: 0 .  Calcium Carb-Cholecalciferol (CALCIUM-VITAMIN D3) 500-400 MG-UNIT TABS, Take 1 tablet by mouth 3 (three) times daily., Disp: , Rfl:  .  cyanocobalamin 500 MCG tablet, Take 1 mcg by mouth every morning., Disp: , Rfl:  .  ferrous gluconate ([employer] IRON) 240 (27 FE) MG tablet, Take 1 tablet (240 mg total) by mouth 3 (three) times daily with meals., Disp: 30 tablet, Rfl: 0 .  FLUoxetine (PROZAC) 10 MG capsule, Take 1 capsule (10 mg total) by mouth daily., Disp: 90 capsule, Rfl: 1 .  gabapentin (NEURONTIN) 600 MG tablet, Take 1.5 tablets (900 mg total) by mouth at bedtime., Disp: 135 tablet, Rfl: 1 .  losartan (COZAAR) 100 MG tablet, Take 1 tablet (100 mg total) by mouth daily., Disp: 90 tablet, Rfl: 1 .  Multiple Vitamin (MULTIVITAMIN WITH MINERALS) TABS tablet, Take 1 tablet by mouth 2 (two) times daily., Disp: , Rfl:  .  nystatin (NYSTATIN) powder, Apply topically 4 (four) times daily., Disp: 60 g, Rfl: 2 .  ondansetron (ZOFRAN ODT) 4 MG disintegrating tablet, Take 1 tablet (4 mg total) by mouth every 8 (eight) hours as needed for nausea or vomiting., Disp: 20 tablet, Rfl: 0 .  polyethylene glycol powder (GLYCOLAX/MIRALAX) powder, Take 17 g by  mouth 2 (two) times daily as needed., Disp: 3350 g, Rfl: 1 .  pravastatin (PRAVACHOL) 20 MG tablet, Take 1 tablet (20 mg total) by mouth daily., Disp: 90 tablet, Rfl: 1 .  Semaglutide, 1 MG/DOSE, (OZEMPIC, 1 MG/DOSE,) 2 MG/1.5ML SOPN, Inject 1 mg into the skin once a week., Disp: 3 mL, Rfl: 5 .  triamcinolone cream (KENALOG) 0.1 %, Apply topically 2 (two) times daily., Disp: 80 g, Rfl: 0 .  zolpidem (AMBIEN) 10 MG tablet, Take 1 tablet (10 mg total) by mouth at bedtime., Disp: 90 tablet, Rfl: 0 .  omeprazole (PRILOSEC) 20 MG capsule, Take 1 capsule (20 mg total) by mouth daily., Disp: 90 capsule, Rfl: 0  No Known Allergies  I personally reviewed active problem list, medication list, allergies, family history, social history with the patient/caregiver today.   ROS  Constitutional: Negative for fever or weight change.  Respiratory: Negative for cough and shortness of breath.   Cardiovascular: Negative for chest pain or palpitations.  Gastrointestinal: Negative for abdominal pain, no bowel changes.  Musculoskeletal: Negative for gait problem or joint swelling.  Skin: Negative for rash.  Neurological: Negative for dizziness or headache.  No other specific complaints in a complete review of systems (except as listed in HPI above).  Objective  Vitals:   09/13/18 1326  BP: 140/90  Pulse: 75  Resp: 16  Temp: 98.2 F (36.8 C)  TempSrc: Oral  SpO2: 98%  Weight: 142 lb 9.6 oz (64.7 kg)  Height: 5\' 3"  (1.6 m)    Body mass index is 25.26 kg/m.  Physical Exam  Constitutional: Patient appears well-developed and well-nourished.  No distress.  HEENT: head atraumatic, normocephalic, pupils equal and reactive to light,  neck supple, throat within normal limits Cardiovascular: Normal rate, regular rhythm and normal heart sounds.  No murmur heard. No BLE edema. Pulmonary/Chest: Effort normal and breath sounds normal. No respiratory distress. Abdominal: Soft.  There is no tenderness.  Skin:  large abdominal folds that causes friction, mild erythema noticed today, worse during the Summer  Psychiatric: Patient has a  normal mood and affect. behavior is normal. Judgment and thought content normal.  Recent Results (from the past 2160 hour(s))  POCT HgB A1C     Status: Normal   Collection Time: 09/13/18  1:41 PM  Result Value Ref Range   Hemoglobin A1C 5.2 4.0 - 5.6 %   HbA1c POC (<> result, manual entry)     HbA1c, POC (prediabetic range)     HbA1c, POC (controlled diabetic range)      Diabetic Foot Exam: Diabetic Foot Exam - Simple   Simple Foot Form Visual Inspection No deformities, no ulcerations, no other skin breakdown bilaterally:  Yes Sensation Testing Intact to touch and monofilament testing bilaterally:  Yes Pulse Check Posterior Tibialis and Dorsalis pulse intact bilaterally:  Yes Comments      PHQ2/9: Depression screen [facility] 2/9 09/13/2018 05/13/2018 12/29/2017 09/21/2017 12/08/2016  Decreased Interest 1 - 0 1 1  Down, Depressed, Hopeless 0 1 1 1  0  PHQ - 2 Score 1 1 1 2 1   Altered sleeping 1 3 1 1  -  Tired, decreased energy 1 1 1 1  -  Change in appetite 0 0 0 1 -  Feeling bad or failure about yourself  0 0 0 0 -  Trouble concentrating 0 0 0 0 -  Moving slowly or fidgety/restless 0 0 0 0 -  Suicidal thoughts 0 0 0 0 -  PHQ-9 Score 3 5 3 5  -  Difficult doing work/chores Somewhat difficult Not difficult at all Somewhat difficult Somewhat difficult -    Fall Risk: Fall Risk  09/13/2018 05/13/2018 12/29/2017 09/21/2017 06/22/2017  Falls in the past year? 0 No No No No  Number falls in past yr: 0 - - - -  Injury with Fall? 0 - - - -     Assessment & Plan  1. Controlled type 2 diabetes mellitus with neuropathy ([facility])  - POCT HgB A1C  2. Chronic insomnia  - zolpidem (AMBIEN) 10 MG tablet; Take 1 tablet (10 mg total) by mouth at bedtime.  Dispense: 90 tablet; Refill: 0  3. Hypertension, benign  - amLODipine (NORVASC) 10 MG tablet; Take 1 tablet (10 mg  total) by mouth daily.  Dispense: 90 tablet; Refill: 0  4. Raynaud's disease without gangrene  - amLODipine (NORVASC) 10 MG tablet; Take 1 tablet (10 mg total) by mouth daily.  Dispense: 90 tablet; Refill: 0  5. History of bariatric surgery   6. Mild recurrent major depression ([place])   7. Type 2 diabetes with nephropathy ([place])   8. GERD with esophagitis  - omeprazole (PRILOSEC) 20 MG capsule; Take 1 capsule (20 mg total) by mouth daily.  Dispense: 90 capsule; Refill: 0  9. Migraine without aura and without status migrainosus, not intractable  - ondansetron (ZOFRAN ODT) 4 MG disintegrating tablet; Take 1 tablet (4 mg total) by mouth every 8 (eight) hours as needed for nausea or vomiting.  Dispense: 20 tablet; Refill: 0  10. Other eczema  - triamcinolone cream (KENALOG) 0.1 %; Apply topically 2 (two) times daily.  Dispense: 80 g; Refill: 0

## 2018-12-02 ENCOUNTER — Other Ambulatory Visit: Payer: Self-pay | Admitting: Family Medicine

## 2018-12-02 ENCOUNTER — Encounter: Payer: Self-pay | Admitting: Family Medicine

## 2018-12-02 DIAGNOSIS — K2100 Gastro-esophageal reflux disease with esophagitis, without bleeding: Secondary | ICD-10-CM

## 2018-12-02 MED ORDER — OMEPRAZOLE 20 MG PO CPDR: 20.0000 mg | DELAYED_RELEASE_CAPSULE | Freq: Every day | ORAL | 0 refills | Status: DC

## 2018-12-05 MED FILL — OZEMPIC 1 MG/DOSE SOPN: 2 | 28 days supply | Qty: 3 | Fill #0

## 2019-01-12 MED FILL — OZEMPIC 1 MG/DOSE SOPN: 2 | 28 days supply | Qty: 3 | Fill #1

## 2019-01-17 ENCOUNTER — Other Ambulatory Visit: Payer: Self-pay

## 2019-01-17 ENCOUNTER — Telehealth: Payer: Self-pay | Admitting: Family Medicine

## 2019-01-17 ENCOUNTER — Ambulatory Visit (INDEPENDENT_AMBULATORY_CARE_PROVIDER_SITE_OTHER): Payer: No Typology Code available for payment source | Admitting: Family Medicine

## 2019-01-17 ENCOUNTER — Encounter: Payer: Self-pay | Admitting: Family Medicine

## 2019-01-17 VITALS — BP 118/82 | HR 98 | Temp 98.4°F | Resp 14 | Wt 144.7 lb

## 2019-01-17 DIAGNOSIS — Z9884 Bariatric surgery status: Secondary | ICD-10-CM

## 2019-01-17 DIAGNOSIS — G629 Polyneuropathy, unspecified: Secondary | ICD-10-CM

## 2019-01-17 DIAGNOSIS — K2100 Gastro-esophageal reflux disease with esophagitis, without bleeding: Secondary | ICD-10-CM

## 2019-01-17 DIAGNOSIS — F5104 Psychophysiologic insomnia: Secondary | ICD-10-CM

## 2019-01-17 DIAGNOSIS — E7800 Pure hypercholesterolemia, unspecified: Secondary | ICD-10-CM

## 2019-01-17 DIAGNOSIS — E1140 Type 2 diabetes mellitus with diabetic neuropathy, unspecified: Secondary | ICD-10-CM | POA: Diagnosis not present

## 2019-01-17 DIAGNOSIS — I7300 Raynaud's syndrome without gangrene: Secondary | ICD-10-CM

## 2019-01-17 DIAGNOSIS — E559 Vitamin D deficiency, unspecified: Secondary | ICD-10-CM

## 2019-01-17 DIAGNOSIS — E538 Deficiency of other specified B group vitamins: Secondary | ICD-10-CM

## 2019-01-17 DIAGNOSIS — I10 Essential (primary) hypertension: Secondary | ICD-10-CM

## 2019-01-17 DIAGNOSIS — F330 Major depressive disorder, recurrent, mild: Secondary | ICD-10-CM

## 2019-01-17 DIAGNOSIS — G43009 Migraine without aura, not intractable, without status migrainosus: Secondary | ICD-10-CM

## 2019-01-17 DIAGNOSIS — Z1211 Encounter for screening for malignant neoplasm of colon: Secondary | ICD-10-CM

## 2019-01-17 DIAGNOSIS — E1121 Type 2 diabetes mellitus with diabetic nephropathy: Secondary | ICD-10-CM

## 2019-01-17 MED ORDER — SEMAGLUTIDE (1 MG/DOSE) 2 MG/1.5ML ~~LOC~~ SOPN: 1.0000 mg | PEN_INJECTOR | SUBCUTANEOUS | 5 refills | Status: DC

## 2019-01-17 MED ORDER — LOSARTAN POTASSIUM 100 MG PO TABS: 100.0000 mg | ORAL_TABLET | Freq: Every day | ORAL | 1 refills | Status: DC

## 2019-01-17 MED ORDER — ONDANSETRON 4 MG PO TBDP: 4.0000 mg | ORAL_TABLET | Freq: Three times a day (TID) | ORAL | 0 refills | Status: DC | PRN

## 2019-01-17 MED ORDER — GABAPENTIN 600 MG PO TABS: 900.0000 mg | ORAL_TABLET | Freq: Every day | ORAL | 1 refills | Status: DC

## 2019-01-17 MED ORDER — PRAVASTATIN SODIUM 20 MG PO TABS: 20.0000 mg | ORAL_TABLET | Freq: Every day | ORAL | 1 refills | Status: DC

## 2019-01-17 MED ORDER — OMEPRAZOLE 20 MG PO CPDR: 20.0000 mg | DELAYED_RELEASE_CAPSULE | Freq: Every day | ORAL | 1 refills | Status: DC

## 2019-01-17 MED ORDER — FLUOXETINE HCL 10 MG PO CAPS: 10.0000 mg | ORAL_CAPSULE | Freq: Every day | ORAL | 1 refills | Status: DC

## 2019-01-17 MED ORDER — ZOLPIDEM TARTRATE 10 MG PO TABS: 10.0000 mg | ORAL_TABLET | Freq: Every day | ORAL | 0 refills | Status: DC

## 2019-01-17 MED ORDER — AMLODIPINE BESYLATE 10 MG PO TABS: 10.0000 mg | ORAL_TABLET | Freq: Every day | ORAL | 0 refills | Status: DC

## 2019-01-17 NOTE — Progress Notes (Signed)
Name: Shannon Soto   MRN: [id number]    DOB: [birth year]   Date:01/17/2019       Progress Note  Subjective  Chief Complaint  Chief Complaint  Patient presents with  . Follow-up  . Hypertension  . Depression  . Gastroesophageal Reflux  . Hyperlipidemia    HPI  Bariatric Surgery: she had sleeve surgery done by Dr. Hassell Done on March 14th, 2016, she has achieved her goal weight of145 lbs, today weight is down to 142.6 lbs  but she now wants a normal BMI and has a few pounds to lose.Weight prior to surgery was 242 lbsShe is doing well,hgbA1C started to go up, and we started her on Ozempic 02/2017 weight is stable in the 140's . She has recurrent rash under breast( but that part is better)and abdominal folds from the significant weight loss and has to use Nystatin prn and medication helps, she states still has burning sensation, medication seems to help but sometimes it has flares with redness and irritation.   Hyperlipidemia: she is off Pravastatin since surgery. LDL is okay , had bariatric surgery.Last LDL was at goal for her, however because she has DM, she is taking pravastatin daily .   HTN: she is on Norvasc for Raynaud's and Losartan for microalbuminuria, bp is at goal now, no dizziness or chest pain   Insomnia: taking medication and is tolerating it well, she has been on medication for many years, she used to take high dose, now she is trying to make it last and at times takes half of one with a gabapentin and is going to bed a little earlier - around 10 pm and is able to sleep 5-6 . Unchanged    DMII with peripheral neuropathy and nephropathy: off metformin since surgery - bariatric surgery in 10/2014 , no polyphagia, but has episodes of polydipsia but no polyuria an, but has nocturia times once  every night.. On Gabapentin seems to help with neuropathy, taking one and a half 600 mg tablets at night only,She is back on Losartan , for 100 microalbuminuria. hgba1C 5.5%it  was up to 6.2% July 2018 , she was started on Ozempic and hgbA1C is down 5.4%, last visit it was 5.2%  negative urine micro. Foot exam was done today, she will get an appointment with ophthalmologist for yearly eye exam  Major depression:chronic symptoms since her son was born. She has beentaking Fluoxetine, one daily, occasionally takes one extra pill during the day. No side effects. 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   Migraines: she has tried multiple medications, but could not tolerate,  episodes about 2-3 times monthsPain is behind her eyes, throbbing like, associated with nausea when she has a severe episode and photophobia. It can last up to 4 hours with medication ( Tylenol ) she is also takingGoody powders intermittently but no longer taking ibuprofen.  Reminded her again that NSAID's are very dangerous and can cause GI bleed in patients that had bariatric surgery. Usually triggered by stress.   Raynaud's: symptoms of  fingers on both hands gets white , painful and numb, not sure of the triggers at this time. Cold is make it much worse, she is now on Norvasc she is on higher dose of Norvasc since January 2020 and is tolerating it well.   Patient Active Problem List  Diagnosis Date Noted  . Controlled type 2 diabetes mellitus with neuropathy ([facility]) 05/12/2016  . Raynaud's phenomenon 05/12/2016  . Intertrigo 09/06/2015  . Migraine headache without aura 02/28/2015  . History of obesity 02/28/2015  . Hyperlipidemia 02/28/2015  . GERD (gastroesophageal reflux disease) 02/28/2015  . Vitamin D deficiency 02/28/2015  . Chronic constipation 02/28/2015  . Chronic insomnia 02/28/2015  . Peripheral neuropathy 02/28/2015  . Vitiligo 02/28/2015  . Lower leg mass 02/28/2015  . Depression with anxiety 02/28/2015  . History of bariatric  surgery 11/05/2014    Past Surgical History:  Procedure Laterality Date  . ABDOMINAL HYSTERECTOMY    . APPENDECTOMY    . BREAST SURGERY    . BREATH TEK H PYLORI N/A 08/27/2014   Procedure: BREATH TEK H PYLORI;  Surgeon: Pedro Earls, MD;  Location: [facility];  Service: General;  Laterality: N/A;  . HIATAL HERNIA REPAIR  11/05/2014   Procedure: LAPAROSCOPIC REPAIR OF HIATAL HERNIA;  Surgeon: Johnathan Hausen, MD;  Location: [facility];  Service: General;;  . LAPAROSCOPIC GASTRIC SLEEVE RESECTION N/A 11/05/2014   Procedure: LAPAROSCOPIC GASTRIC SLEEVE RESECTION;  Surgeon: Johnathan Hausen, MD;  Location: [facility];  Service: General;  Laterality: N/A;  . REDUCTION MAMMAPLASTY Bilateral 1997  . UPPER GI ENDOSCOPY  11/05/2014   Procedure: UPPER GI ENDOSCOPY;  Surgeon: Johnathan Hausen, MD;  Location: [facility];  Service: General;;    Family History  Problem Relation Age of Onset  . Diabetes Paternal Uncle   . Stroke Father   . Hypertension Other   . Breast cancer Maternal Grandmother 74    Social History   Socioeconomic History  . Marital status: Single    Spouse name: Not on file  . Number of children: 3  . Years of education: Not on file  . Highest education level: Associate degree: occupational, Hotel manager, or vocational program  Occupational History  . Not on file  Social Needs  . Financial resource strain: Not hard at all  . Food insecurity:    Worry: Never true    Inability: Never true  . Transportation needs:    Medical: No    Non-medical: No  Tobacco Use  . Smoking status: Never Smoker  . Smokeless tobacco: Never Used  Substance and Sexual Activity  . Alcohol use: No    Alcohol/week: 0.0 standard drinks    Comment: occasional   . Drug use: No  . Sexual activity: Yes  Lifestyle  . Physical activity:    Days per week: 2 days    Minutes per session: 20 min  . Stress: To some extent  Relationships  . Social connections:    Talks on phone: Not on file    Gets together:  Not on file    Attends religious service: Not on file    Active member of club or organization: Not on file    Attends meetings of clubs or organizations: Not on file    Relationship status: Not on file  . Intimate partner violence:    Fear of current or ex partner: No    Emotionally abused: No    Physically abused: No    Forced sexual activity: No  Other Topics Concern  . Not on file  Social History Narrative  . Not on file     Current Outpatient Medications:  .  amLODipine (NORVASC) 10 MG tablet, Take 1 tablet (10 mg total) by mouth daily., Disp: 90 tablet, Rfl: 0 .  Calcium Carb-Cholecalciferol (CALCIUM-VITAMIN D3) 500-400 MG-UNIT  TABS, Take 1 tablet by mouth 3 (three) times daily., Disp: , Rfl:  .  cyanocobalamin 500 MCG tablet, Take 1 mcg by mouth every morning., Disp: , Rfl:  .  ferrous gluconate ([employer] IRON) 240 (27 FE) MG tablet, Take 1 tablet (240 mg total) by mouth 3 (three) times daily with meals., Disp: 30 tablet, Rfl: 0 .  FLUoxetine (PROZAC) 10 MG capsule, Take 1 capsule (10 mg total) by mouth daily., Disp: 90 capsule, Rfl: 1 .  gabapentin (NEURONTIN) 600 MG tablet, Take 1.5 tablets (900 mg total) by mouth at bedtime., Disp: 135 tablet, Rfl: 1 .  losartan (COZAAR) 100 MG tablet, Take 1 tablet (100 mg total) by mouth daily., Disp: 90 tablet, Rfl: 1 .  Multiple Vitamin (MULTIVITAMIN WITH MINERALS) TABS tablet, Take 1 tablet by mouth 2 (two) times daily., Disp: , Rfl:  .  nystatin (NYSTATIN) powder, Apply topically 4 (four) times daily., Disp: 60 g, Rfl: 2 .  omeprazole (PRILOSEC) 20 MG capsule, Take 1 capsule (20 mg total) by mouth daily., Disp: 90 capsule, Rfl: 0 .  ondansetron (ZOFRAN ODT) 4 MG disintegrating tablet, Take 1 tablet (4 mg total) by mouth every 8 (eight) hours as needed for nausea or vomiting., Disp: 20 tablet, Rfl: 0 .  polyethylene glycol powder (GLYCOLAX/MIRALAX) powder, Take 17 g by mouth 2 (two) times daily as needed., Disp: 3350 g, Rfl: 1 .  pravastatin  (PRAVACHOL) 20 MG tablet, Take 1 tablet (20 mg total) by mouth daily., Disp: 90 tablet, Rfl: 1 .  Semaglutide, 1 MG/DOSE, (OZEMPIC, 1 MG/DOSE,) 2 MG/1.5ML SOPN, Inject 1 mg into the skin once a week., Disp: 3 mL, Rfl: 5 .  triamcinolone cream (KENALOG) 0.1 %, Apply topically 2 (two) times daily., Disp: 80 g, Rfl: 0 .  zolpidem (AMBIEN) 10 MG tablet, Take 1 tablet (10 mg total) by mouth at bedtime., Disp: 90 tablet, Rfl: 0  No Known Allergies  I personally reviewed active problem list, medication list, allergies, family history, social history with the patient/caregiver today.   ROS  Constitutional: Negative for fever or weight change.  Respiratory: Negative for cough and shortness of breath.   Cardiovascular: Negative for chest pain or palpitations.  Gastrointestinal: Negative for abdominal pain, no bowel changes.  Musculoskeletal: Negative for gait problem or joint swelling.  Skin: intermittent for rash  Neurological: Negative for dizziness, positive for intermittent  headache.  No other specific complaints in a complete review of systems (except as listed in HPI above).  Objective  Vitals:   01/17/19 1333  BP: 118/82  Pulse: 98  Resp: 14  Temp: 98.4 F (36.9 C)  TempSrc: Oral  SpO2: 95%  Weight: 144 lb 11.2 oz (65.6 kg)    Body mass index is 25.63 kg/m.  Physical Exam  Constitutional: Patient appears well-developed and well-nourished. Overweight.  No distress.  HEENT: head atraumatic, normocephalic, pupils equal and reactive to ligh, neck supple, wearing facial mask  Cardiovascular: Normal rate, regular rhythm and normal heart sounds.  No murmur heard. No BLE edema. Pulmonary/Chest: Effort normal and breath sounds normal. No respiratory distress. Abdominal: Soft.  There is no tenderness. Skin: large abdominal skin fold, has hyperpigmentation under skin fold secondary to recurrent yeast infection  Psychiatric: Patient has a normal mood and affect. behavior is normal.  Judgment and thought content normal.  Diabetic Foot Exam: Diabetic Foot Exam - Simple   Simple Foot Form Diabetic Foot exam was performed with the following findings:  Yes 01/17/2019  1:42 PM  Visual Inspection No deformities, no ulcerations, no other skin breakdown bilaterally:  Yes Sensation Testing Intact to touch and monofilament testing bilaterally:  Yes Pulse Check Posterior Tibialis and Dorsalis pulse intact bilaterally:  Yes Comments      PHQ2/9: Depression screen [facility] 2/9 01/17/2019 09/13/2018 05/13/2018 12/29/2017 09/21/2017  Decreased Interest 0 1 - 0 1  Down, Depressed, Hopeless 0 0 1 1 1   PHQ - 2 Score 0 1 1 1 2   Altered sleeping 0 1 3 1 1   Tired, decreased energy 0 1 1 1 1   Change in appetite 0 0 0 0 1  Feeling bad or failure about yourself  0 0 0 0 0  Trouble concentrating 0 0 0 0 0  Moving slowly or fidgety/restless 0 0 0 0 0  Suicidal thoughts 0 0 0 0 0  PHQ-9 Score 0 3 5 3 5   Difficult doing work/chores Not difficult at all Somewhat difficult Not difficult at all Somewhat difficult Somewhat difficult    phq 9 is negative   Fall Risk: Fall Risk  01/17/2019 09/13/2018 05/13/2018 12/29/2017 09/21/2017  Falls in the past year? 0 0 No No No  Number falls in past yr: 0 0 - - -  Injury with Fall? 0 0 - - -    Functional Status Survey: Is the patient deaf or have difficulty hearing?: No Does the patient have difficulty seeing, even when wearing glasses/contacts?: No Does the patient have difficulty concentrating, remembering, or making decisions?: No Does the patient have difficulty walking or climbing stairs?: No Does the patient have difficulty dressing or bathing?: No Does the patient have difficulty doing errands alone such as visiting a doctor's office or shopping?: No    Assessment & Plan  1. GERD with esophagitis  - omeprazole (PRILOSEC) 20 MG capsule; Take 1 capsule (20 mg total) by mouth daily.  Dispense: 90 capsule; Refill: 1  2. History of bariatric  surgery  Doing well   3. Controlled type 2 diabetes mellitus with neuropathy ([facility])  - gabapentin (NEURONTIN) 600 MG tablet; Take 1.5 tablets (900 mg total) by mouth at bedtime.  Dispense: 135 tablet; Refill: 1 - Semaglutide, 1 MG/DOSE, (OZEMPIC, 1 MG/DOSE,) 2 MG/1.5ML SOPN; Inject 1 mg into the skin once a week.  Dispense: 3 mL; Refill: 5  4. Chronic insomnia  - zolpidem (AMBIEN) 10 MG tablet; Take 1 tablet (10 mg total) by mouth at bedtime.  Dispense: 90 tablet; Refill: 0  5. Hypertension, benign  - amLODipine (NORVASC) 10 MG tablet; Take 1 tablet (10 mg total) by mouth daily.  Dispense: 90 tablet; Refill: 0  6. Colon cancer screening  Sent a message to Dr. Hassell Done to see if he can do it   7. Mild recurrent major depression (HCC)  - FLUoxetine (PROZAC) 10 MG capsule; Take 1 capsule (10 mg total) by mouth daily.  Dispense: 90 capsule; Refill: 1  8. Migraine without aura and without status migrainosus, not intractable  - ondansetron (ZOFRAN ODT) 4 MG disintegrating tablet; Take 1 tablet (4 mg total) by mouth every 8 (eight) hours as needed for nausea or vomiting.  Dispense: 20 tablet; Refill: 0  9. Peripheral polyneuropathy  - gabapentin (NEURONTIN) 600 MG tablet; Take 1.5 tablets (900 mg total) by mouth at bedtime.  Dispense: 135 tablet; Refill: 1  10. Pure hypercholesterolemia  - pravastatin (PRAVACHOL) 20 MG tablet; Take 1 tablet (20 mg total) by mouth daily.  Dispense: 90 tablet; Refill: 1  11. B12 deficiency   12.  Vitamin D deficiency  Continue vitamin D supplementation  13. Raynaud's disease without gangrene  - amLODipine (NORVASC) 10 MG tablet; Take 1 tablet (10 mg total) by mouth daily.  Dispense: 90 tablet; Refill: 0  14. Type 2 diabetes with nephropathy (HCC)  - losartan (COZAAR) 100 MG tablet; Take 1 tablet (100 mg total) by mouth daily.  Dispense: 90 tablet; Refill: 1

## 2019-01-24 ENCOUNTER — Ambulatory Visit (INDEPENDENT_AMBULATORY_CARE_PROVIDER_SITE_OTHER): Payer: No Typology Code available for payment source | Admitting: Family Medicine

## 2019-01-24 ENCOUNTER — Other Ambulatory Visit: Payer: Self-pay

## 2019-01-24 ENCOUNTER — Encounter: Payer: Self-pay | Admitting: Family Medicine

## 2019-01-24 VITALS — BP 120/78 | HR 82 | Temp 98.4°F | Resp 14 | Ht 62.0 in | Wt 144.3 lb

## 2019-01-24 DIAGNOSIS — E1140 Type 2 diabetes mellitus with diabetic neuropathy, unspecified: Secondary | ICD-10-CM

## 2019-01-24 DIAGNOSIS — Z1239 Encounter for other screening for malignant neoplasm of breast: Secondary | ICD-10-CM

## 2019-01-24 DIAGNOSIS — Z0000 Encounter for general adult medical examination without abnormal findings: Secondary | ICD-10-CM

## 2019-01-24 DIAGNOSIS — Z1211 Encounter for screening for malignant neoplasm of colon: Secondary | ICD-10-CM | POA: Diagnosis not present

## 2019-01-24 NOTE — Patient Instructions (Signed)
Preventive Care 40-64 Years, Female Preventive care refers to lifestyle choices and visits with your health care provider that can promote health and wellness. What does preventive care include?   A yearly physical exam. This is also called an annual well check.  Dental exams once or twice a year.  Routine eye exams. Ask your health care provider how often you should have your eyes checked.  Personal lifestyle choices, including: ? Daily care of your teeth and gums. ? Regular physical activity. ? Eating a healthy diet. ? Avoiding tobacco and drug use. ? Limiting alcohol use. ? Practicing safe sex. ? Taking low-dose aspirin daily starting at age 50. ? Taking vitamin and mineral supplements as recommended by your health care provider. What happens during an annual well check? The services and screenings done by your health care provider during your annual well check will depend on your age, overall health, lifestyle risk factors, and family history of disease. Counseling Your health care provider may ask you questions about your:  Alcohol use.  Tobacco use.  Drug use.  Emotional well-being.  Home and relationship well-being.  Sexual activity.  Eating habits.  Work and work environment.  Method of birth control.  Menstrual cycle.  Pregnancy history. Screening You may have the following tests or measurements:  Height, weight, and BMI.  Blood pressure.  Lipid and cholesterol levels. These may be checked every 5 years, or more frequently if you are over 50 years old.  Skin check.  Lung cancer screening. You may have this screening every year starting at age 55 if you have a 30-pack-year history of smoking and currently smoke or have quit within the past 15 years.  Colorectal cancer screening. All adults should have this screening starting at age 50 and continuing until age 75. Your health care provider may recommend screening at age 45. You will have tests every  1-10 years, depending on your results and the type of screening test. People at increased risk should start screening at an earlier age. Screening tests may include: ? Guaiac-based fecal occult blood testing. ? Fecal immunochemical test (FIT). ? Stool DNA test. ? Virtual colonoscopy. ? Sigmoidoscopy. During this test, a flexible tube with a tiny camera (sigmoidoscope) is used to examine your rectum and lower colon. The sigmoidoscope is inserted through your anus into your rectum and lower colon. ? Colonoscopy. During this test, a long, thin, flexible tube with a tiny camera (colonoscope) is used to examine your entire colon and rectum.  Hepatitis C blood test.  Hepatitis B blood test.  Sexually transmitted disease (STD) testing.  Diabetes screening. This is done by checking your blood sugar (glucose) after you have not eaten for a while (fasting). You may have this done every 1-3 years.  Mammogram. This may be done every 1-2 years. Talk to your health care provider about when you should start having regular mammograms. This may depend on whether you have a family history of breast cancer.  BRCA-related cancer screening. This may be done if you have a family history of breast, ovarian, tubal, or peritoneal cancers.  Pelvic exam and Pap test. This may be done every 3 years starting at age 21. Starting at age 30, this may be done every 5 years if you have a Pap test in combination with an HPV test.  Bone density scan. This is done to screen for osteoporosis. You may have this scan if you are at high risk for osteoporosis. Discuss your test results, treatment options,   and if necessary, the need for more tests with your health care provider. Vaccines Your health care provider may recommend certain vaccines, such as:  Influenza vaccine. This is recommended every year.  Tetanus, diphtheria, and acellular pertussis (Tdap, Td) vaccine. You may need a Td booster every 10 years.  Varicella  vaccine. You may need this if you have not been vaccinated.  Zoster vaccine. You may need this after age 38.  Measles, mumps, and rubella (MMR) vaccine. You may need at least one dose of MMR if you were born in 1957 or later. You may also need a second dose.  Pneumococcal 13-valent conjugate (PCV13) vaccine. You may need this if you have certain conditions and were not previously vaccinated.  Pneumococcal polysaccharide (PPSV23) vaccine. You may need one or two doses if you smoke cigarettes or if you have certain conditions.  Meningococcal vaccine. You may need this if you have certain conditions.  Hepatitis A vaccine. You may need this if you have certain conditions or if you travel or work in places where you may be exposed to hepatitis A.  Hepatitis B vaccine. You may need this if you have certain conditions or if you travel or work in places where you may be exposed to hepatitis B.  Haemophilus influenzae type b (Hib) vaccine. You may need this if you have certain conditions. Talk to your health care provider about which screenings and vaccines you need and how often you need them. This information is not intended to replace advice given to you by your health care provider. Make sure you discuss any questions you have with your health care provider. Document Released: 09/06/2015 Document Revised: 09/30/2017 Document Reviewed: 06/11/2015 Elsevier Interactive Patient Education  2019 [employer].

## 2019-01-24 NOTE — Progress Notes (Signed)
Name: Shannon Soto   MRN: [id number]    DOB: 1971-01-20   Date:01/24/2019       Progress Note  Subjective  Chief Complaint  Chief Complaint  Patient presents with  . Annual Exam    HPI   Patient presents for annual CPE   Diet: eats small meals and has a high protein diet , she tries to have 65-70 g per day  Exercise: walking and line dancing 25-30 minutes twice weekly   USPSTF grade A and B recommendations    Office Visit from 01/24/2019 in [facility]  AUDIT-C Score  0     Depression: Phq 9 is  negative Depression screen [facility] 2/9 01/24/2019 01/17/2019 09/13/2018 05/13/2018 12/29/2017  Decreased Interest 0 0 1 - 0  Down, Depressed, Hopeless 0 0 0 1 1  PHQ - 2 Score 0 0 _0 Altered sleeping 1 0 _1 Tired, decreased energy 1 0 _2 Change in appetite 0 0 0 0 0  Feeling bad or failure about yourself  0 0 0 0 0  Trouble concentrating 0 0 0 0 0  Moving slowly or fidgety/restless 0 0 0 0 0  Suicidal thoughts 0 0 0 0 0  PHQ-9 Score 2 0 _3 Difficult doing work/chores Not difficult at all Not difficult at all Somewhat difficult Not difficult at all Somewhat difficult   Hypertension: BP Readings from Last 3 Encounters:  01/24/19 120/78  01/17/19 118/82  09/13/18 140/90   Obesity: Wt Readings from Last 3 Encounters:  01/24/19 144 lb 4.8 oz (65.5 kg)  01/17/19 144 lb 11.2 oz (65.6 kg)  09/13/18 142 lb 9.6 oz (64.7 kg)   BMI Readings from Last 3 Encounters:  01/24/19 26.39 kg/m  01/17/19 25.63 kg/m  09/13/18 25.26 kg/m    Hep C Screening: up to date  STD testing and prevention (HIV/chl/gon/syphilis): refused, she states same partner for over one year Intimate partner violence: negative screen  Sexual History/Pain during Intercourse: no pain during intercourse , no vaginal problems Menstrual History/LMP/Abnormal Bleeding: no cycles, s/p hysterectomy  Incontinence Symptoms: none   Advanced Care Planning: A voluntary discussion about advance  care planning including the explanation and discussion of advance directives.  Discussed health care proxy and Living will, and the patient was able to identify a health care proxy as son.  Patient does not have a living will at present time.   Breast cancer: order placed today  BRCA gene screening: N/A Cervical cancer screening: s/p hysterectomy   Osteoporosis Screening: discussed importance of physical activity and high calcium and vitamin D diet   Lipids:  Lab Results  Component Value Date   CHOL 179 02/08/2018   CHOL 175 12/08/2016   CHOL 192 06/24/2015   Lab Results  Component Value Date   HDL 47 02/08/2018   HDL 45 12/08/2016   HDL 40 (L) 06/24/2015   Lab Results  Component Value Date   LDLCALC 122 (H) 02/08/2018   LDLCALC 121 (H) 12/08/2016   LDLCALC 138 (H) 06/24/2015   Lab Results  Component Value Date   TRIG 49 02/08/2018   TRIG 43 12/08/2016   TRIG 72 06/24/2015   Lab Results  Component Value Date   CHOLHDL 3.8 02/08/2018   CHOLHDL 3.9 12/08/2016   CHOLHDL 4.8 06/24/2015   No results found for: LDLDIRECT  Glucose:  Glucose  Date Value Ref Range Status  05/22/2014 114 (H) 65 -  99 mg/dL Final  06/14/2013 84 65 - 99 mg/dL Final  05/27/2012 81 65 - 99 mg/dL Final   Glucose, Bld  Date Value Ref Range Status  02/08/2018 80 65 - 99 mg/dL Final  12/08/2016 103 (H) 65 - 99 mg/dL Final  06/24/2015 106 (H) 65 - 99 mg/dL Final   Glucose-Capillary  Date Value Ref Range Status  11/08/2014 103 (H) 70 - 99 mg/dL Final  11/05/2014 186 (H) 70 - 99 mg/dL Final  11/05/2014 100 (H) 70 - 99 mg/dL Final    Skin cancer: discussed atypical lesions Colorectal cancer: discussed options such as colonoscopy, cologuard and hemoccult cards Lung cancer:   Low Dose CT Chest recommended if Age 97-80 years, 30 pack-year currently smoking OR have quit w/in 15years. Patient does not qualify.   VEL:[id number]  Patient Active Problem List   Diagnosis Date Noted  . Controlled type 2  diabetes mellitus with neuropathy ([place]) 05/12/2016  . Raynaud's phenomenon 05/12/2016  . Intertrigo 09/06/2015  . Migraine headache without aura 02/28/2015  . History of obesity 02/28/2015  . Hyperlipidemia 02/28/2015  . GERD (gastroesophageal reflux disease) 02/28/2015  . Vitamin D deficiency 02/28/2015  . Chronic constipation 02/28/2015  . Chronic insomnia 02/28/2015  . Peripheral neuropathy 02/28/2015  . Vitiligo 02/28/2015  . Lower leg mass 02/28/2015  . Depression with anxiety 02/28/2015  . History of bariatric surgery 11/05/2014    Past Surgical History:  Procedure Laterality Date  . ABDOMINAL HYSTERECTOMY    . APPENDECTOMY    . BREAST SURGERY    . BREATH TEK H PYLORI N/A 08/27/2014   Procedure: BREATH TEK H PYLORI;  Surgeon: Pedro Earls, MD;  Location: [facility];  Service: General;  Laterality: N/A;  . HIATAL HERNIA REPAIR  11/05/2014   Procedure: LAPAROSCOPIC REPAIR OF HIATAL HERNIA;  Surgeon: Johnathan Hausen, MD;  Location: [facility];  Service: General;;  . LAPAROSCOPIC GASTRIC SLEEVE RESECTION N/A 11/05/2014   Procedure: LAPAROSCOPIC GASTRIC SLEEVE RESECTION;  Surgeon: Johnathan Hausen, MD;  Location: [facility];  Service: General;  Laterality: N/A;  . REDUCTION MAMMAPLASTY Bilateral 1997  . UPPER GI ENDOSCOPY  11/05/2014   Procedure: UPPER GI ENDOSCOPY;  Surgeon: Johnathan Hausen, MD;  Location: [facility];  Service: General;;    Family History  Problem Relation Age of Onset  . Diabetes Paternal Uncle   . Stroke Father   . Hypertension Other   . Breast cancer Maternal Grandmother 41    Social History   Socioeconomic History  . Marital status: Single    Spouse name: Not on file  . Number of children: 3  . Years of education: Not on file  . Highest education level: Associate degree: occupational, Hotel manager, or vocational program  Occupational History  . Not on file  Social Needs  . Financial resource strain: Not hard at all  . Food insecurity:    Worry: Never true     Inability: Never true  . Transportation needs:    Medical: No    Non-medical: No  Tobacco Use  . Smoking status: Never Smoker  . Smokeless tobacco: Never Used  Substance and Sexual Activity  . Alcohol use: No    Alcohol/week: 0.0 standard drinks    Comment: occasional   . Drug use: No  . Sexual activity: Yes  Lifestyle  . Physical activity:    Days per week: 2 days    Minutes per session: 20 min  . Stress: To some extent  Relationships  . Social connections:  Talks on phone: Not on file    Gets together: Not on file    Attends religious service: Not on file    Active member of club or organization: Not on file    Attends meetings of clubs or organizations: Not on file    Relationship status: Not on file  . Intimate partner violence:    Fear of current or ex partner: No    Emotionally abused: No    Physically abused: No    Forced sexual activity: No  Other Topics Concern  . Not on file  Social History Narrative  . Not on file     Current Outpatient Medications:  .  amLODipine (NORVASC) 10 MG tablet, Take 1 tablet (10 mg total) by mouth daily., Disp: 90 tablet, Rfl: 0 .  Calcium Carb-Cholecalciferol (CALCIUM-VITAMIN D3) 500-400 MG-UNIT TABS, Take 1 tablet by mouth 3 (three) times daily., Disp: , Rfl:  .  cyanocobalamin 500 MCG tablet, Take 1 mcg by mouth every morning., Disp: , Rfl:  .  ferrous gluconate ([employer] IRON) 240 (27 FE) MG tablet, Take 1 tablet (240 mg total) by mouth 3 (three) times daily with meals., Disp: 30 tablet, Rfl: 0 .  FLUoxetine (PROZAC) 10 MG capsule, Take 1 capsule (10 mg total) by mouth daily., Disp: 90 capsule, Rfl: 1 .  gabapentin (NEURONTIN) 600 MG tablet, Take 1.5 tablets (900 mg total) by mouth at bedtime., Disp: 135 tablet, Rfl: 1 .  losartan (COZAAR) 100 MG tablet, Take 1 tablet (100 mg total) by mouth daily., Disp: 90 tablet, Rfl: 1 .  Multiple Vitamin (MULTIVITAMIN WITH MINERALS) TABS tablet, Take 1 tablet by mouth 2 (two) times daily.,  Disp: , Rfl:  .  nystatin (NYSTATIN) powder, Apply topically 4 (four) times daily., Disp: 60 g, Rfl: 2 .  omeprazole (PRILOSEC) 20 MG capsule, Take 1 capsule (20 mg total) by mouth daily., Disp: 90 capsule, Rfl: 1 .  ondansetron (ZOFRAN ODT) 4 MG disintegrating tablet, Take 1 tablet (4 mg total) by mouth every 8 (eight) hours as needed for nausea or vomiting., Disp: 20 tablet, Rfl: 0 .  polyethylene glycol powder (GLYCOLAX/MIRALAX) powder, Take 17 g by mouth 2 (two) times daily as needed., Disp: 3350 g, Rfl: 1 .  pravastatin (PRAVACHOL) 20 MG tablet, Take 1 tablet (20 mg total) by mouth daily., Disp: 90 tablet, Rfl: 1 .  Semaglutide, 1 MG/DOSE, (OZEMPIC, 1 MG/DOSE,) 2 MG/1.5ML SOPN, Inject 1 mg into the skin once a week., Disp: 3 mL, Rfl: 5 .  triamcinolone cream (KENALOG) 0.1 %, Apply topically 2 (two) times daily., Disp: 80 g, Rfl: 0 .  zolpidem (AMBIEN) 10 MG tablet, Take 1 tablet (10 mg total) by mouth at bedtime., Disp: 90 tablet, Rfl: 0  No Known Allergies   ROS  Constitutional: Negative for fever or weight change.  Respiratory: Negative for cough and shortness of breath.   Cardiovascular: Negative for chest pain or palpitations.  Gastrointestinal: Negative for abdominal pain, no bowel changes.  Musculoskeletal: Negative for gait problem or joint swelling.  Skin: Negative for rash.  Neurological: Negative for dizziness or headache.  No other specific complaints in a complete review of systems (except as listed in HPI above).   Objective  Vitals:   01/24/19 1323  BP: 120/78  Pulse: 82  Resp: 14  Temp: 98.4 F (36.9 C)  TempSrc: Oral  SpO2: 98%  Weight: 144 lb 4.8 oz (65.5 kg)  Height: _0  (1.575 m)    Body mass index  is 26.39 kg/m.  Physical Exam  Constitutional: Patient appears well-developed and well-nourished. No distress.  HENT: Head: Normocephalic and atraumatic. Ears: B TMs ok, no erythema or effusion; Nose: Nose normal. Mouth/Throat: Oropharynx is clear and  moist. No oropharyngeal exudate.  Eyes: Conjunctivae and EOM are normal. Pupils are equal, round, and reactive to light. No scleral icterus.  Neck: Normal range of motion. Neck supple. No JVD present. No thyromegaly present.  Cardiovascular: Normal rate, regular rhythm and normal heart sounds.  No murmur heard. No BLE edema. Pulmonary/Chest: Effort normal and breath sounds normal. No respiratory distress. Abdominal: Soft. Bowel sounds are normal, no distension. There is no tenderness. no masses Breast: lumpy breast, scars from breast reduction surgery  FEMALE GENITALIA:  Not done RECTAL: not done Musculoskeletal: Normal range of motion, no joint effusions. No gross deformities Neurological: he is alert and oriented to person, place, and time. No cranial nerve deficit. Coordination, balance, strength, speech and gait are normal.  Skin: Skin is warm and dry. No rash noted. No erythema. she has vitiligo seen dermatologist in the past  Psychiatric: Patient has a normal mood and affect. behavior is normal. Judgment and thought content normal.  PHQ2/9: Depression screen [facility] 2/9 01/24/2019 01/17/2019 09/13/2018 05/13/2018 12/29/2017  Decreased Interest 0 0 1 - 0  Down, Depressed, Hopeless 0 0 0 1 1  PHQ - 2 Score 0 0 _0 Altered sleeping 1 0 _1 Tired, decreased energy 1 0 _2 Change in appetite 0 0 0 0 0  Feeling bad or failure about yourself  0 0 0 0 0  Trouble concentrating 0 0 0 0 0  Moving slowly or fidgety/restless 0 0 0 0 0  Suicidal thoughts 0 0 0 0 0  PHQ-9 Score 2 0 _3 Difficult doing work/chores Not difficult at all Not difficult at all Somewhat difficult Not difficult at all Somewhat difficult     Fall Risk: Fall Risk  01/17/2019 09/13/2018 05/13/2018 12/29/2017 09/21/2017  Falls in the past year? 0 0 No No No  Number falls in past yr: 0 0 - - -  Injury with Fall? 0 0 - - -     Assessment & Plan  1. Well woman exam  - Urine Microalbumin w/creat. ratio - Lipid  panel - B12 and Folate Panel - Hemoglobin A1c - VITAMIN D 25 Hydroxy (Vit-D Deficiency, Fractures) - COMPLETE METABOLIC PANEL WITH GFR - CBC with Differential/Platelet - Iron, TIBC and Ferritin Panel  2. Colon cancer screening  Colonoscopy ordered   3. Controlled type 2 diabetes mellitus with neuropathy (H[facility])  - Urine Microalbumin w/creat. ratio  4. Breast cancer screening  - MM Digital Screening; Future  -USPSTF grade A and B recommendations reviewed with patient; age-appropriate recommendations, preventive care, screening tests, etc discussed and encouraged; healthy living encouraged; see AVS for patient education given to patient -Discussed importance of 150 minutes of physical activity weekly, eat two servings of fish weekly, eat one serving of tree nuts ( cashews, pistachios, pecans, almonds.Marland Kitchen) every other day, eat 6 servings of fruit/vegetables daily and drink plenty of water and avoid sweet beverages.

## 2019-01-24 NOTE — Telephone Encounter (Signed)
Erroneous entry

## 2019-01-25 ENCOUNTER — Telehealth: Payer: Self-pay

## 2019-01-25 ENCOUNTER — Other Ambulatory Visit: Payer: Self-pay

## 2019-01-25 DIAGNOSIS — Z1211 Encounter for screening for malignant neoplasm of colon: Secondary | ICD-10-CM

## 2019-01-25 LAB — COMPLETE METABOLIC PANEL WITH GFR
AG Ratio: 2 (calc) (ref 1.0–2.5)
ALT: 13 U/L (ref 6–29)
AST: 13 U/L (ref 10–35)
Albumin: 4.3 g/dL (ref 3.6–5.1)
Alkaline phosphatase (APISO): 92 U/L (ref 37–153)
BUN: 23 mg/dL (ref 7–25)
CO2: 29 mmol/L (ref 20–32)
Calcium: 9.2 mg/dL (ref 8.6–10.4)
Chloride: 104 mmol/L (ref 98–110)
Creat: 0.72 mg/dL (ref 0.50–1.05)
GFR, Est African American: 113 mL/min/{1.73_m2} (ref >=60)
GFR, Est Non African American: 98 mL/min/{1.73_m2} (ref >=60)
Globulin: 2.2 g/dL (calc) (ref 1.9–3.7)
Glucose, Bld: 84 mg/dL (ref 65–99)
Potassium: 4 mmol/L (ref 3.5–5.3)
Sodium: 140 mmol/L (ref 135–146)
Total Bilirubin: 0.3 mg/dL (ref 0.2–1.2)
Total Protein: 6.5 g/dL (ref 6.1–8.1)

## 2019-01-25 LAB — CBC WITH DIFFERENTIAL/PLATELET
Absolute Monocytes: 502 cells/uL (ref 200–950)
Basophils Absolute: 68 cells/uL (ref 0–200)
Basophils Relative: 0.9 %
Eosinophils Absolute: 137 cells/uL (ref 15–500)
Eosinophils Relative: 1.8 %
HCT: 39.1 % (ref 35.0–45.0)
Hemoglobin: 12.2 g/dL (ref 11.7–15.5)
Lymphs Abs: 3093 cells/uL (ref 850–3900)
MCH: 25.8 pg — ABNORMAL LOW (ref 27.0–33.0)
MCHC: 31.2 g/dL — ABNORMAL LOW (ref 32.0–36.0)
MCV: 82.7 fL (ref 80.0–100.0)
MPV: 10.5 fL (ref 7.5–12.5)
Monocytes Relative: 6.6 %
Neutro Abs: 3800 cells/uL (ref 1500–7800)
Neutrophils Relative %: 50 %
Platelets: 365 10*3/uL (ref 140–400)
RBC: 4.73 10*6/uL (ref 3.80–5.10)
RDW: 13 % (ref 11.0–15.0)
Total Lymphocyte: 40.7 %
WBC: 7.6 10*3/uL (ref 3.8–10.8)

## 2019-01-25 LAB — B12 AND FOLATE PANEL
Folate: >24 ng/mL
Vitamin B-12: 528 pg/mL (ref 200–1100)

## 2019-01-25 LAB — LIPID PANEL
Cholesterol: 147 mg/dL (ref <200)
HDL: 55 mg/dL (ref >=50)
LDL Cholesterol (Calc): 79 mg/dL (calc)
Non-HDL Cholesterol (Calc): 92 mg/dL (calc) (ref <130)
Total CHOL/HDL Ratio: 2.7 (calc) (ref <5.0)
Triglycerides: 47 mg/dL (ref <150)

## 2019-01-25 LAB — HEMOGLOBIN A1C
Hgb A1c MFr Bld: 5.3 % of total Hgb (ref <5.7)
Mean Plasma Glucose: 105 (calc)
eAG (mmol/L): 5.8 (calc)

## 2019-01-25 LAB — IRON,TIBC AND FERRITIN PANEL
%SAT: 18 % (calc) (ref 16–45)
Ferritin: 101 ng/mL (ref 16–232)
Iron: 59 ug/dL (ref 45–160)
TIBC: 330 mcg/dL (calc) (ref 250–450)

## 2019-01-25 LAB — MICROALBUMIN / CREATININE URINE RATIO
Creatinine, Urine: 149 mg/dL (ref 20–275)
Microalb Creat Ratio: 1 mcg/mg creat (ref <30)
Microalb, Ur: 0.2 mg/dL

## 2019-01-25 LAB — VITAMIN D 25 HYDROXY (VIT D DEFICIENCY, FRACTURES): Vit D, 25-Hydroxy: 27 ng/mL — ABNORMAL LOW (ref 30–100)

## 2019-01-25 NOTE — Telephone Encounter (Signed)
Gastroenterology Pre-Procedure Review  Request Date: 02/17/19 Requesting Physician: Dr. Allen Norris  PATIENT REVIEW QUESTIONS: The patient responded to the following health history questions as indicated:    1. Are you having any GI issues? No 2. Do you have a personal history of Polyps? No 3. Do you have a family history of Colon Cancer or Polyps?No  4. Diabetes Mellitus?  5. Joint replacements in the past 12 months?No 6. Major health problems in the past 3 months?No 7. Any artificial heart valves, MVP, or defibrillator?No    MEDICATIONS & ALLERGIES:    Patient reports the following regarding taking any anticoagulation/antiplatelet therapy:   Plavix, Coumadin, Eliquis, Xarelto, Lovenox, Pradaxa, Brilinta, or Effient? No Aspirin? No  Patient confirms/reports the following medications:  Current Outpatient Medications  Medication Sig Dispense Refill  . amLODipine (NORVASC) 10 MG tablet Take 1 tablet (10 mg total) by mouth daily. 90 tablet 0  . Calcium Carb-Cholecalciferol (CALCIUM-VITAMIN D3) 500-400 MG-UNIT TABS Take 1 tablet by mouth 3 (three) times daily.    . cyanocobalamin 500 MCG tablet Take 1 mcg by mouth every morning.    . ferrous gluconate ([employer] IRON) 240 (27 FE) MG tablet Take 1 tablet (240 mg total) by mouth 3 (three) times daily with meals. 30 tablet 0  . FLUoxetine (PROZAC) 10 MG capsule Take 1 capsule (10 mg total) by mouth daily. 90 capsule 1  . gabapentin (NEURONTIN) 600 MG tablet Take 1.5 tablets (900 mg total) by mouth at bedtime. 135 tablet 1  . losartan (COZAAR) 100 MG tablet Take 1 tablet (100 mg total) by mouth daily. 90 tablet 1  . Multiple Vitamin (MULTIVITAMIN WITH MINERALS) TABS tablet Take 1 tablet by mouth 2 (two) times daily.    Marland Kitchen nystatin (NYSTATIN) powder Apply topically 4 (four) times daily. 60 g 2  . omeprazole (PRILOSEC) 20 MG capsule Take 1 capsule (20 mg total) by mouth daily. 90 capsule 1  . ondansetron (ZOFRAN ODT) 4 MG disintegrating tablet Take 1 tablet  (4 mg total) by mouth every 8 (eight) hours as needed for nausea or vomiting. 20 tablet 0  . polyethylene glycol powder (GLYCOLAX/MIRALAX) powder Take 17 g by mouth 2 (two) times daily as needed. 3350 g 1  . pravastatin (PRAVACHOL) 20 MG tablet Take 1 tablet (20 mg total) by mouth daily. 90 tablet 1  . Semaglutide, 1 MG/DOSE, (OZEMPIC, 1 MG/DOSE,) 2 MG/1.5ML SOPN Inject 1 mg into the skin once a week. 3 mL 5  . triamcinolone cream (KENALOG) 0.1 % Apply topically 2 (two) times daily. 80 g 0  . zolpidem (AMBIEN) 10 MG tablet Take 1 tablet (10 mg total) by mouth at bedtime. 90 tablet 0   No current facility-administered medications for this visit.     Patient confirms/reports the following allergies:  No Known Allergies  No orders of the defined types were placed in this encounter.   AUTHORIZATION INFORMATION Primary Insurance: 1D#: Group #:  Secondary Insurance: 1D#: Group #:  SCHEDULE INFORMATION: Date: 02/17/19 Time: Location:[facility]

## 2019-02-07 ENCOUNTER — Telehealth: Payer: Self-pay

## 2019-02-07 NOTE — Telephone Encounter (Signed)
Returned patients call.  She said she forgot to mention that she had a gastric sleeve in 2016 and she may not be able to drink all of the bowel prep.  I informed her that the SuPrep Bowel Kit contains about 22 oz of fluids all together and she does not have to gulp it all at one time she can sip on it slowly.  This should be done at 5pm the evening before her procedure and 5 hours before her procedure.  Asked her to call the office back if she has any other concerns.  Thanks [employer]

## 2019-02-09 ENCOUNTER — Other Ambulatory Visit: Payer: Self-pay

## 2019-02-09 ENCOUNTER — Encounter: Payer: Self-pay | Admitting: *Deleted

## 2019-02-14 ENCOUNTER — Other Ambulatory Visit: Admission: RE | Admit: 2019-02-14 | Payer: No Typology Code available for payment source | Source: Ambulatory Visit

## 2019-02-15 ENCOUNTER — Other Ambulatory Visit
Admission: RE | Admit: 2019-02-15 | Discharge: 2019-02-15 | Disposition: A | Discharge status: Home / Self Care | Payer: No Typology Code available for payment source | Source: Ambulatory Visit | Attending: Gastroenterology | Admitting: Gastroenterology

## 2019-02-15 ENCOUNTER — Other Ambulatory Visit: Payer: Self-pay

## 2019-02-15 DIAGNOSIS — Z1159 Encounter for screening for other viral diseases: Secondary | ICD-10-CM | POA: Diagnosis not present

## 2019-02-16 LAB — NOVEL CORONAVIRUS, NAA (HOSP ORDER, SEND-OUT TO REF LAB; TAT 18-24 HRS): SARS-CoV-2, NAA: NOT DETECTED

## 2019-02-16 NOTE — Discharge Instructions (Signed)
General Anesthesia, Adult, Care After  This sheet gives you information about how to care for yourself after your procedure. Your health care provider may also give you more specific instructions. If you have problems or questions, contact your health care provider.  What can I expect after the procedure?  After the procedure, the following side effects are common:  Pain or discomfort at the IV site.  Nausea.  Vomiting.  Sore throat.  Trouble concentrating.  Feeling cold or chills.  Weak or tired.  Sleepiness and fatigue.  Soreness and body aches. These side effects can affect parts of the body that were not involved in surgery.  Follow these instructions at home:    For at least 24 hours after the procedure:  Have a responsible adult stay with you. It is important to have someone help care for you until you are awake and alert.  Rest as needed.  Do not:  Participate in activities in which you could fall or become injured.  Drive.  Use heavy machinery.  Drink alcohol.  Take sleeping pills or medicines that cause drowsiness.  Make important decisions or sign legal documents.  Take care of children on your own.  Eating and drinking  Follow any instructions from your health care provider about eating or drinking restrictions.  When you feel hungry, start by eating small amounts of foods that are soft and easy to digest (bland), such as toast. Gradually return to your regular diet.  Drink enough fluid to keep your urine pale yellow.  If you vomit, rehydrate by drinking water, juice, or clear broth.  General instructions  If you have sleep apnea, surgery and certain medicines can increase your risk for breathing problems. Follow instructions from your health care provider about wearing your sleep device:  Anytime you are sleeping, including during daytime naps.  While taking prescription pain medicines, sleeping medicines, or medicines that make you drowsy.  Return to your normal activities as told by your health care  provider. Ask your health care provider what activities are safe for you.  Take over-the-counter and prescription medicines only as told by your health care provider.  If you smoke, do not smoke without supervision.  Keep all follow-up visits as told by your health care provider. This is important.  Contact a health care provider if:  You have nausea or vomiting that does not get better with medicine.  You cannot eat or drink without vomiting.  You have pain that does not get better with medicine.  You are unable to pass urine.  You develop a skin rash.  You have a fever.  You have redness around your IV site that gets worse.  Get help right away if:  You have difficulty breathing.  You have chest pain.  You have blood in your urine or stool, or you vomit blood.  Summary  After the procedure, it is common to have a sore throat or nausea. It is also common to feel tired.  Have a responsible adult stay with you for the first 24 hours after general anesthesia. It is important to have someone help care for you until you are awake and alert.  When you feel hungry, start by eating small amounts of foods that are soft and easy to digest (bland), such as toast. Gradually return to your regular diet.  Drink enough fluid to keep your urine pale yellow.  Return to your normal activities as told by your health care provider. Ask your health care   provider what activities are safe for you.  This information is not intended to replace advice given to you by your health care provider. Make sure you discuss any questions you have with your health care provider.  Document Released: 11/16/2000 Document Revised: 03/26/2017 Document Reviewed: 03/26/2017  Elsevier Interactive Patient Education  2019 [employer]

## 2019-02-17 ENCOUNTER — Other Ambulatory Visit: Payer: Self-pay

## 2019-02-17 ENCOUNTER — Ambulatory Visit: Payer: No Typology Code available for payment source | Admitting: Anesthesiology

## 2019-02-17 ENCOUNTER — Ambulatory Visit
Admission: RE | Admit: 2019-02-17 | Discharge: 2019-02-17 | Disposition: A | Discharge status: Home / Self Care | Payer: No Typology Code available for payment source | Attending: Gastroenterology | Admitting: Gastroenterology

## 2019-02-17 ENCOUNTER — Encounter
Admission: RE | Disposition: A | Discharge status: Home / Self Care | Payer: Self-pay | Source: Home / Self Care | Attending: Gastroenterology

## 2019-02-17 DIAGNOSIS — Z90710 Acquired absence of both cervix and uterus: Secondary | ICD-10-CM | POA: Diagnosis not present

## 2019-02-17 DIAGNOSIS — Z833 Family history of diabetes mellitus: Secondary | ICD-10-CM | POA: Diagnosis not present

## 2019-02-17 DIAGNOSIS — Z803 Family history of malignant neoplasm of breast: Secondary | ICD-10-CM | POA: Insufficient documentation

## 2019-02-17 DIAGNOSIS — E119 Type 2 diabetes mellitus without complications: Secondary | ICD-10-CM | POA: Insufficient documentation

## 2019-02-17 DIAGNOSIS — E785 Hyperlipidemia, unspecified: Secondary | ICD-10-CM | POA: Diagnosis not present

## 2019-02-17 DIAGNOSIS — K641 Second degree hemorrhoids: Secondary | ICD-10-CM | POA: Insufficient documentation

## 2019-02-17 DIAGNOSIS — Z79899 Other long term (current) drug therapy: Secondary | ICD-10-CM | POA: Insufficient documentation

## 2019-02-17 DIAGNOSIS — I7300 Raynaud's syndrome without gangrene: Secondary | ICD-10-CM | POA: Diagnosis not present

## 2019-02-17 DIAGNOSIS — K219 Gastro-esophageal reflux disease without esophagitis: Secondary | ICD-10-CM | POA: Diagnosis not present

## 2019-02-17 DIAGNOSIS — Z1211 Encounter for screening for malignant neoplasm of colon: Secondary | ICD-10-CM

## 2019-02-17 DIAGNOSIS — G43909 Migraine, unspecified, not intractable, without status migrainosus: Secondary | ICD-10-CM | POA: Insufficient documentation

## 2019-02-17 DIAGNOSIS — Z823 Family history of stroke: Secondary | ICD-10-CM | POA: Insufficient documentation

## 2019-02-17 DIAGNOSIS — Z8249 Family history of ischemic heart disease and other diseases of the circulatory system: Secondary | ICD-10-CM | POA: Insufficient documentation

## 2019-02-17 DIAGNOSIS — K635 Polyp of colon: Secondary | ICD-10-CM | POA: Diagnosis not present

## 2019-02-17 DIAGNOSIS — D125 Benign neoplasm of sigmoid colon: Secondary | ICD-10-CM | POA: Diagnosis not present

## 2019-02-17 DIAGNOSIS — I10 Essential (primary) hypertension: Secondary | ICD-10-CM | POA: Diagnosis not present

## 2019-02-17 HISTORY — DX: Reserved for inherently not codable concepts without codable children: IMO0001

## 2019-02-17 HISTORY — DX: Migraine, unspecified, not intractable, without status migrainosus: G43.909

## 2019-02-17 HISTORY — PX: POLYPECTOMY: SHX5525

## 2019-02-17 HISTORY — DX: Gastro-esophageal reflux disease without esophagitis: K21.9

## 2019-02-17 HISTORY — DX: Encounter for fitting and adjustment of orthodontic device: Z46.4

## 2019-02-17 HISTORY — PX: COLONOSCOPY WITH PROPOFOL: SHX5780

## 2019-02-17 HISTORY — DX: Raynaud's syndrome without gangrene: I73.00

## 2019-02-17 SURGERY — COLONOSCOPY WITH PROPOFOL
Anesthesia: General | Site: Rectum

## 2019-02-17 MED ORDER — ACETAMINOPHEN 160 MG/5ML PO SOLN: 325.0000 mg | ORAL | Status: DC | PRN

## 2019-02-17 MED ORDER — STERILE WATER FOR IRRIGATION IR SOLN
Status: DC | PRN
  Administered 2019-02-17: 3 mL

## 2019-02-17 MED ORDER — LIDOCAINE HCL (CARDIAC) PF 100 MG/5ML IV SOSY
PREFILLED_SYRINGE | INTRAVENOUS | Status: DC | PRN
  Administered 2019-02-17: 50 mg via INTRAVENOUS

## 2019-02-17 MED ORDER — LACTATED RINGERS IV SOLN
INTRAVENOUS | Status: DC
  Administered 2019-02-17: 11:00:00 via INTRAVENOUS

## 2019-02-17 MED ORDER — PROPOFOL 10 MG/ML IV BOLUS
INTRAVENOUS | Status: DC | PRN
  Administered 2019-02-17 (×2): 100 mg via INTRAVENOUS

## 2019-02-17 MED ORDER — ACETAMINOPHEN 325 MG PO TABS: 325.0000 mg | ORAL_TABLET | ORAL | Status: DC | PRN

## 2019-02-17 MED ORDER — SODIUM CHLORIDE 0.9 % IV SOLN: INTRAVENOUS | Status: DC

## 2019-02-17 SURGICAL SUPPLY — 24 items
CANISTER SUCT 1200ML W/VALVE (MISCELLANEOUS) ×3 IMPLANT
CLIP HMST 235XBRD CATH ROT (MISCELLANEOUS) IMPLANT
CLIP RESOLUTION 360 11X235 (MISCELLANEOUS)
ELECT REM PT RETURN 9FT ADLT (ELECTROSURGICAL)
ELECTRODE REM PT RTRN 9FT ADLT (ELECTROSURGICAL) IMPLANT
FCP ESCP3.2XJMB 240X2.8X (MISCELLANEOUS) ×2
FORCEPS BIOP RAD 4 LRG CAP 4 (CUTTING FORCEPS) IMPLANT
FORCEPS BIOP RJ4 240 W/NDL (MISCELLANEOUS) ×3
FORCEPS ESCP3.2XJMB 240X2.8X (MISCELLANEOUS) IMPLANT
GOWN CVR UNV OPN BCK APRN NK (MISCELLANEOUS) ×4 IMPLANT
GOWN ISOL THUMB LOOP REG UNIV (MISCELLANEOUS) ×6
INJECTOR VARIJECT VIN23 (MISCELLANEOUS) IMPLANT
KIT DEFENDO VALVE AND CONN (KITS) IMPLANT
KIT ENDO PROCEDURE OLY (KITS) ×3 IMPLANT
MARKER SPOT ENDO TATTOO 5ML (MISCELLANEOUS) IMPLANT
PROBE APC STR FIRE (PROBE) IMPLANT
RETRIEVER NET ROTH 2.5X230 LF (MISCELLANEOUS) IMPLANT
SNARE SHORT THROW 13M SML OVAL (MISCELLANEOUS) IMPLANT
SNARE SHORT THROW 30M LRG OVAL (MISCELLANEOUS) IMPLANT
SNARE SNG USE RND 15MM (INSTRUMENTS) IMPLANT
SPOT EX ENDOSCOPIC TATTOO (MISCELLANEOUS)
TRAP ETRAP POLY (MISCELLANEOUS) IMPLANT
VARIJECT INJECTOR VIN23 (MISCELLANEOUS)
WATER STERILE IRR 250ML POUR (IV SOLUTION) ×3 IMPLANT

## 2019-02-17 NOTE — Anesthesia Procedure Notes (Signed)
Performed by: Izetta Dakin, CRNA Pre-anesthesia Checklist: Timeout performed, Suction available, Patient being monitored, Patient identified and Emergency Drugs available Patient Re-evaluated:Patient Re-evaluated prior to induction Oxygen Delivery Method: Nasal cannula

## 2019-02-17 NOTE — H&P (Signed)
Lucilla Lame, MD [facility] [address], [place],  [address] Phone: [phone] Fax : [phone]  Primary Care Physician:  Steele Sizer, MD Primary Gastroenterologist:  Dr. Allen Norris  Pre-Procedure History & Physical: HPI:  Shannon Soto is a 48 y.o. female is here for a screening colonoscopy.   Past Medical History:  Diagnosis Date  . Diabetes mellitus without complication ([place])    improved after gastric sleeve  . GERD (gastroesophageal reflux disease)   . Hyperlipidemia   . Hypertension   . Migraine headache    2x/mo  . Orthodontics    braces  . Raynaud's syndrome     Past Surgical History:  Procedure Laterality Date  . ABDOMINAL HYSTERECTOMY    . APPENDECTOMY    . BREAST SURGERY    . BREATH TEK H PYLORI N/A 08/27/2014   Procedure: BREATH TEK H PYLORI;  Surgeon: Pedro Earls, MD;  Location: [facility];  Service: General;  Laterality: N/A;  . HIATAL HERNIA REPAIR  11/05/2014   Procedure: LAPAROSCOPIC REPAIR OF HIATAL HERNIA;  Surgeon: Johnathan Hausen, MD;  Location: [facility];  Service: General;;  . LAPAROSCOPIC GASTRIC SLEEVE RESECTION N/A 11/05/2014   Procedure: LAPAROSCOPIC GASTRIC SLEEVE RESECTION;  Surgeon: Johnathan Hausen, MD;  Location: [facility];  Service: General;  Laterality: N/A;  . REDUCTION MAMMAPLASTY Bilateral 1997  . UPPER GI ENDOSCOPY  11/05/2014   Procedure: UPPER GI ENDOSCOPY;  Surgeon: Johnathan Hausen, MD;  Location: [facility];  Service: General;;    Prior to Admission medications   Medication Sig Start Date End Date Taking? Authorizing Provider  amLODipine (NORVASC) 10 MG tablet Take 1 tablet (10 mg total) by mouth daily. 01/17/19  Yes Sowles, Drue Stager, MD  Calcium Carb-Cholecalciferol (CALCIUM-VITAMIN D3) 500-400 MG-UNIT TABS Take 1 tablet by mouth 3 (three) times daily.   Yes [provider]  cyanocobalamin 500 MCG tablet Take 1 mcg by mouth every morning.   Yes [provider]  ferrous gluconate ([facility] IRON) 240 (27 FE) MG  tablet Take 1 tablet (240 mg total) by mouth 3 (three) times daily with meals. 02/28/15  Yes Sowles, Drue Stager, MD  FLUoxetine (PROZAC) 10 MG capsule Take 1 capsule (10 mg total) by mouth daily. 01/17/19  Yes Sowles, Drue Stager, MD  gabapentin (NEURONTIN) 600 MG tablet Take 1.5 tablets (900 mg total) by mouth at bedtime. 01/17/19  Yes Sowles, Drue Stager, MD  losartan (COZAAR) 100 MG tablet Take 1 tablet (100 mg total) by mouth daily. 01/17/19  Yes Sowles, Drue Stager, MD  Multiple Vitamin (MULTIVITAMIN WITH MINERALS) TABS tablet Take 1 tablet by mouth 2 (two) times daily.   Yes [provider]  nystatin (NYSTATIN) powder Apply topically 4 (four) times daily. 05/13/18  Yes Sowles, Drue Stager, MD  omeprazole (PRILOSEC) 20 MG capsule Take 1 capsule (20 mg total) by mouth daily. 01/17/19  Yes Sowles, Drue Stager, MD  ondansetron (ZOFRAN ODT) 4 MG disintegrating tablet Take 1 tablet (4 mg total) by mouth every 8 (eight) hours as needed for nausea or vomiting. 01/17/19  Yes Sowles, Drue Stager, MD  polyethylene glycol powder (GLYCOLAX/MIRALAX) powder Take 17 g by mouth 2 (two) times daily as needed. 09/15/16  Yes Sowles, Drue Stager, MD  Semaglutide, 1 MG/DOSE, (OZEMPIC, 1 MG/DOSE,) 2 MG/1.5ML SOPN Inject 1 mg into the skin once a week. 01/17/19  Yes Sowles, Drue Stager, MD  triamcinolone cream (KENALOG) 0.1 % Apply topically 2 (two) times daily. 09/13/18  Yes Sowles, Drue Stager, MD  zolpidem (AMBIEN) 10 MG tablet Take 1 tablet (10 mg total)  by mouth at bedtime. 01/17/19  Yes Sowles, Drue Stager, MD  pravastatin (PRAVACHOL) 20 MG tablet Take 1 tablet (20 mg total) by mouth daily. 01/17/19   Steele Sizer, MD    Allergies as of 01/25/2019  . (No Known Allergies)    Family History  Problem Relation Age of Onset  . Diabetes Paternal Uncle   . Stroke Father   . Hypertension Other   . Breast cancer Maternal Grandmother 54    Social History   Socioeconomic History  . Marital status: Single    Spouse name: Not on file  . Number of  children: 3  . Years of education: Not on file  . Highest education level: Associate degree: occupational, Hotel manager, or vocational program  Occupational History  . Not on file  Social Needs  . Financial resource strain: Not hard at all  . Food insecurity    Worry: Never true    Inability: Never true  . Transportation needs    Medical: No    Non-medical: No  Tobacco Use  . Smoking status: Never Smoker  . Smokeless tobacco: Never Used  Substance and Sexual Activity  . Alcohol use: No    Alcohol/week: 0.0 standard drinks    Comment: occasional   . Drug use: No  . Sexual activity: Yes  Lifestyle  . Physical activity    Days per week: 2 days    Minutes per session: 20 min  . Stress: To some extent  Relationships  . Social Herbalist on phone: Not on file    Gets together: Not on file    Attends religious service: Not on file    Active member of club or organization: Not on file    Attends meetings of clubs or organizations: Not on file    Relationship status: Not on file  . Intimate partner violence    Fear of current or ex partner: No    Emotionally abused: No    Physically abused: No    Forced sexual activity: No  Other Topics Concern  . Not on file  Social History Narrative  . Not on file    Review of Systems: See HPI, otherwise negative ROS  Physical Exam: Ht 5\' 2"  (1.575 m)   Wt 64 kg   BMI 25.79 kg/m  General:   Alert,  pleasant and cooperative in NAD Head:  Normocephalic and atraumatic. Neck:  Supple; no masses or thyromegaly. Lungs:  Clear throughout to auscultation.    Heart:  Regular rate and rhythm. Abdomen:  Soft, nontender and nondistended. Normal bowel sounds, without guarding, and without rebound.   Neurologic:  Alert and  oriented x4;  grossly normal neurologically.  Impression/Plan: Shannon Soto is now here to undergo a screening colonoscopy.  Risks, benefits, and alternatives regarding colonoscopy have been reviewed with  the patient.  Questions have been answered.  All parties agreeable.

## 2019-02-17 NOTE — Transfer of Care (Signed)
Immediate Anesthesia Transfer of Care Note  Patient: Shannon Soto  Procedure(s) Performed: COLONOSCOPY WITH PROPOFOL (N/A Rectum) POLYPECTOMY (Rectum)  Patient Location: PACU  Anesthesia Type: General  Level of Consciousness: awake, alert  and patient cooperative  Airway and Oxygen Therapy: Patient Spontanous Breathing and Patient connected to supplemental oxygen  Post-op Assessment: Post-op Vital signs reviewed, Patient's Cardiovascular Status Stable, Respiratory Function Stable, Patent Airway and No signs of Nausea or vomiting  Post-op Vital Signs: Reviewed and stable  Complications: No apparent anesthesia complications

## 2019-02-17 NOTE — Op Note (Signed)
[facility]astroenter[facility] Patient Name: Shannon Soto Procedure Date: 02/17/2019 11:09 AM MRN: [id number] Account #: [id number] Date of Birth: [birth year] Admit Type: Outpatient Age: 48 Room: [facility] OR ROOM 01 Gender: Female Note Status: Finalized Procedure:            Colonoscopy Indications:          Screening for colorectal malignant neoplasm Providers:            Lucilla Lame MD, MD Medicines:            Propofol per Anesthesia Complications:        No immediate complications. Procedure:            Pre-Anesthesia Assessment:                       - Prior to the procedure, a History and Physical was                        performed, and patient medications and allergies were                        reviewed. The patient's tolerance of previous                        anesthesia was also reviewed. The risks and benefits of                        the procedure and the sedation options and risks were                        discussed with the patient. All questions were                        answered, and informed consent was obtained. Prior                        Anticoagulants: The patient has taken no previous                        anticoagulant or antiplatelet agents. ASA Grade                        Assessment: II - A patient with mild systemic disease.                        After reviewing the risks and benefits, the patient was                        deemed in satisfactory condition to undergo the                        procedure.                       After obtaining informed consent, the colonoscope was                        passed under direct vision. Throughout the procedure,                        the patient's blood pressure,  pulse, and oxygen                        saturations were monitored continuously. The                        Colonoscope was introduced through the anus and                        advanced to the the cecum, identified by  appendiceal                        orifice and ileocecal valve. The colonoscopy was                        performed without difficulty. The patient tolerated the                        procedure well. The quality of the bowel preparation                        was excellent. Findings:      The perianal and digital rectal examinations were normal.      A 3 mm polyp was found in the sigmoid colon. The polyp was sessile. The       polyp was removed with a cold biopsy forceps. Resection and retrieval       were complete.      Non-bleeding internal hemorrhoids were found during retroflexion. The       hemorrhoids were Grade II (internal hemorrhoids that prolapse but reduce       spontaneously). Impression:           - One 3 mm polyp in the sigmoid colon, removed with a                        cold biopsy forceps. Resected and retrieved.                       - Non-bleeding internal hemorrhoids. Recommendation:       - Discharge patient to home.                       - Resume previous diet.                       - Continue present medications.                       - Await pathology results. Procedure Code(s):    --- Professional ---                       [phone], Colonoscopy, flexible; with biopsy, single or                        multiple Diagnosis Code(s):    --- Professional ---                       Z12.11, Encounter for screening for malignant neoplasm                        of colon  K63.5, Polyp of colon CPT copyright 2019 American Medical Association. All rights reserved. The codes documented in this report are preliminary and upon coder review may  be revised to meet current compliance requirements. Lucilla Lame MD, MD 02/17/2019 11:39:38 AM This report has been signed electronically. Number of Addenda: 0 Note Initiated On: 02/17/2019 11:09 AM Scope Withdrawal Time: 0 hours 8 minutes 15 seconds  Total Procedure Duration: 0 hours 12 minutes 34 seconds   Estimated Blood Loss: Estimated blood loss: none.      [facility]

## 2019-02-17 NOTE — Anesthesia Preprocedure Evaluation (Signed)
Anesthesia Evaluation  Patient identified by MRN, date of birth, ID band Patient awake    Reviewed: Allergy & Precautions, H&P , NPO status , Patient's Chart, lab work & pertinent test results, reviewed documented beta blocker date and time   Airway Mallampati: II  TM Distance: >3 FB Neck ROM: full    Dental no notable dental hx.    Pulmonary neg pulmonary ROS,    Pulmonary exam normal breath sounds clear to auscultation       Cardiovascular Exercise Tolerance: Good hypertension, Normal cardiovascular exam Rhythm:regular Rate:Normal     Neuro/Psych negative neurological ROS  negative psych ROS   GI/Hepatic Neg liver ROS, GERD  ,  Endo/Other  negative endocrine ROSdiabetes  Renal/GU negative Renal ROS  negative genitourinary   Musculoskeletal   Abdominal   Peds  Hematology negative hematology ROS (+)   Anesthesia Other Findings   Reproductive/Obstetrics negative OB ROS                             Anesthesia Physical Anesthesia Plan  ASA: II  Anesthesia Plan: General   Post-op Pain Management:    Induction:   PONV Risk Score and Plan:   Airway Management Planned:   Additional Equipment:   Intra-op Plan:   Post-operative Plan:   Informed Consent: I have reviewed the patients History and Physical, chart, labs and discussed the procedure including the risks, benefits and alternatives for the proposed anesthesia with the patient or authorized representative who has indicated his/her understanding and acceptance.     Dental Advisory Given  Plan Discussed with: CRNA and Anesthesiologist  Anesthesia Plan Comments:         Anesthesia Quick Evaluation

## 2019-02-17 NOTE — Anesthesia Postprocedure Evaluation (Signed)
Anesthesia Post Note  Patient: Shannon Soto  Procedure(s) Performed: COLONOSCOPY WITH PROPOFOL (N/A Rectum) POLYPECTOMY (Rectum)  Patient location during evaluation: PACU Anesthesia Type: General Level of consciousness: awake and alert Pain management: pain level controlled Vital Signs Assessment: post-procedure vital signs reviewed and stable Respiratory status: spontaneous breathing, nonlabored ventilation, respiratory function stable and patient connected to nasal cannula oxygen Cardiovascular status: blood pressure returned to baseline and stable Postop Assessment: no apparent nausea or vomiting Anesthetic complications: no    Trecia Rogers

## 2019-02-20 ENCOUNTER — Encounter: Payer: Self-pay | Admitting: Gastroenterology

## 2019-02-21 ENCOUNTER — Encounter: Payer: Self-pay | Admitting: Gastroenterology

## 2019-02-21 LAB — SURGICAL PATHOLOGY

## 2019-05-05 ENCOUNTER — Ambulatory Visit
Admission: RE | Admit: 2019-05-05 | Discharge: 2019-05-05 | Disposition: A | Discharge status: Home / Self Care | Payer: No Typology Code available for payment source | Source: Ambulatory Visit | Attending: Family Medicine | Admitting: Family Medicine

## 2019-05-05 DIAGNOSIS — Z1231 Encounter for screening mammogram for malignant neoplasm of breast: Secondary | ICD-10-CM | POA: Diagnosis not present

## 2019-05-05 DIAGNOSIS — Z1239 Encounter for other screening for malignant neoplasm of breast: Secondary | ICD-10-CM

## 2019-05-30 ENCOUNTER — Ambulatory Visit (INDEPENDENT_AMBULATORY_CARE_PROVIDER_SITE_OTHER): Payer: No Typology Code available for payment source | Admitting: Family Medicine

## 2019-05-30 ENCOUNTER — Encounter: Payer: Self-pay | Admitting: Family Medicine

## 2019-05-30 ENCOUNTER — Other Ambulatory Visit: Payer: Self-pay

## 2019-05-30 VITALS — BP 110/80 | HR 73 | Temp 97.3°F | Resp 16 | Ht 62.0 in | Wt 150.5 lb

## 2019-05-30 DIAGNOSIS — E1140 Type 2 diabetes mellitus with diabetic neuropathy, unspecified: Secondary | ICD-10-CM

## 2019-05-30 DIAGNOSIS — I7300 Raynaud's syndrome without gangrene: Secondary | ICD-10-CM

## 2019-05-30 DIAGNOSIS — I10 Essential (primary) hypertension: Secondary | ICD-10-CM | POA: Diagnosis not present

## 2019-05-30 DIAGNOSIS — G43009 Migraine without aura, not intractable, without status migrainosus: Secondary | ICD-10-CM

## 2019-05-30 DIAGNOSIS — F5104 Psychophysiologic insomnia: Secondary | ICD-10-CM

## 2019-05-30 DIAGNOSIS — E1121 Type 2 diabetes mellitus with diabetic nephropathy: Secondary | ICD-10-CM

## 2019-05-30 DIAGNOSIS — F330 Major depressive disorder, recurrent, mild: Secondary | ICD-10-CM

## 2019-05-30 LAB — POCT GLYCOSYLATED HEMOGLOBIN (HGB A1C): Hemoglobin A1C: 5.3 % (ref 4.0–5.6)

## 2019-05-30 MED ORDER — OZEMPIC (1 MG/DOSE) 2 MG/1.5ML ~~LOC~~ SOPN: 1.0000 mg | PEN_INJECTOR | SUBCUTANEOUS | 5 refills | Status: DC

## 2019-05-30 MED ORDER — ZOLPIDEM TARTRATE 10 MG PO TABS: 10.0000 mg | ORAL_TABLET | Freq: Every day | ORAL | 0 refills | Status: DC

## 2019-05-30 MED ORDER — AMLODIPINE BESYLATE 10 MG PO TABS: 10.0000 mg | ORAL_TABLET | Freq: Every day | ORAL | 1 refills | Status: DC

## 2019-05-30 MED ORDER — ONDANSETRON 4 MG PO TBDP: 4.0000 mg | ORAL_TABLET | Freq: Three times a day (TID) | ORAL | 0 refills | Status: DC | PRN

## 2019-05-30 NOTE — Progress Notes (Signed)
Name: Shannon Soto   MRN: [id number]    DOB: 1971-01-07   Date:05/30/2019       Progress Note  Subjective  Chief Complaint  Chief Complaint  Patient presents with  . Medication Refill  . Diabetes  . Hyperlipidemia  . Hypertension  . Insomnia  . Depression  . Migraines  . Bariatric Surgery    HPI  Bariatric Surgery: she had sleeve surgery done by Dr. Hassell Done on March 14th, 2016, she has achieved her goal weight of145 lbs,today weight is up to 150 lbs, she states she is very stressed, skipping meals and not as active since COVI-19 Weight prior to surgery was 242 lbsShe is doing well,hgbA1C started to go up, and we started her on Ozempic 02/2017 weight was in the 140 range until recently.  She has recurrent rash under breast( but that part is better with topical medication)and abdominal folds from the significant weight loss and has to use Nystatin prn and medication helps, she states still has burning sensation, medication seems to help but sometimes it has flares with redness and irritation.   Hyperlipidemia: she is off Pravastatin since surgery. had bariatric surgery.Last LDL was at goal for her, however because she has DM, she is taking pravastatin daily . HDL was very good   HTN: she is on Norvasc for Raynaud's and Losartan for microalbuminuria, bp is towards low end of normal, but she does not have any dizziness or chest pain   Insomnia: taking medication and is tolerating it well,she has been on medication for many years, she used to take high dose, now she is trying to make it last and at times takes half . She has been going to bed later because has to help her youngest son with school work after she gets home from school   DMII with peripheral neuropathy and nephropathy: off metformin since surgery - bariatric surgery in 10/2014 , no polyphagia, but has episodes of polydipsia but no polyuria an, but has nocturia times once  every night.. On Gabapentin seems to help  with neuropathy, taking one and a half 600 mg tablets at night only,She is back on Losartan , for 100 microalbuminuria. hgba1C 5.5%it was up to 6.2% July 2018 , she was started on Ozempic and hgbA1C is still at goal.   Major depression:chronic symptoms since her son was born. She has beentaking Fluoxetine, one daily, occasionally takes one extra pill during the day. No side effects. 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    Migraines: she has tried multiple medications, but could not tolerate, episodes about 1-2 times monthsPain is behind her eyes, throbbing like, associated with nauseawhen she has a severe episode and photophobia. It can last up to 4 hours with medication ( Tylenol ) she is also takingGoody powders intermittently but no longer taking ibuprofen.  Reminded her again that NSAID's are very dangerous and can cause GI bleed in patients that had bariatric surgery. Usually triggered by stress. She is having an episode right now, took zofran but is still nauseated.   Raynaud's: symptoms offingers on both hands gets white , painful and numb, not sure of the triggers at this time. Cold is make it much worse, she is now on Norvasc she is  on higher dose of Norvasc since January 2020 and is tolerating it well. Unchanged    Patient Active Problem List   Diagnosis Date Noted  . Encounter for screening colonoscopy   . Polyp of sigmoid colon   . Controlled type 2 diabetes mellitus with neuropathy ([place]) 05/12/2016  . Raynaud's phenomenon 05/12/2016  . Intertrigo 09/06/2015  . Migraine headache without aura 02/28/2015  . History of obesity 02/28/2015  . Hyperlipidemia 02/28/2015  . GERD (gastroesophageal reflux disease) 02/28/2015  . Vitamin D deficiency 02/28/2015   . Chronic constipation 02/28/2015  . Chronic insomnia 02/28/2015  . Peripheral neuropathy 02/28/2015  . Vitiligo 02/28/2015  . Lower leg mass 02/28/2015  . Depression with anxiety 02/28/2015  . History of bariatric surgery 11/05/2014    Past Surgical History:  Procedure Laterality Date  . ABDOMINAL HYSTERECTOMY    . APPENDECTOMY    . BREAST SURGERY    . BREATH TEK H PYLORI N/A 08/27/2014   Procedure: BREATH TEK H PYLORI;  Surgeon: Pedro Earls, MD;  Location: [facility];  Service: General;  Laterality: N/A;  . COLONOSCOPY WITH PROPOFOL N/A 02/17/2019   Procedure: COLONOSCOPY WITH PROPOFOL;  Surgeon: Lucilla Lame, MD;  Location: [facility];  Service: Endoscopy;  Laterality: N/A;  . HIATAL HERNIA REPAIR  11/05/2014   Procedure: LAPAROSCOPIC REPAIR OF HIATAL HERNIA;  Surgeon: Johnathan Hausen, MD;  Location: [facility];  Service: General;;  . LAPAROSCOPIC GASTRIC SLEEVE RESECTION N/A 11/05/2014   Procedure: LAPAROSCOPIC GASTRIC SLEEVE RESECTION;  Surgeon: Johnathan Hausen, MD;  Location: [facility];  Service: General;  Laterality: N/A;  . POLYPECTOMY  02/17/2019   Procedure: POLYPECTOMY;  Surgeon: Lucilla Lame, MD;  Location: [place];  Service: Endoscopy;;  . REDUCTION MAMMAPLASTY Bilateral 1997  . UPPER GI ENDOSCOPY  11/05/2014   Procedure: UPPER GI ENDOSCOPY;  Surgeon: Johnathan Hausen, MD;  Location: [facility];  Service: General;;    Family History  Problem Relation Age of Onset  . Diabetes Paternal Uncle   . Stroke Father   . Hypertension Other   . Breast cancer Maternal Grandmother 31    Social History   Socioeconomic History  . Marital status: Single    Spouse name: Not on file  . Number of children: 3  . Years of education: Not on file  . Highest education level: Associate degree: occupational, Hotel manager, or vocational program  Occupational History  . Not on file  Social Needs  . Financial resource strain: Not hard at all  . Food insecurity    Worry: Never  true    Inability: Never true  . Transportation needs    Medical: No    Non-medical: No  Tobacco Use  . Smoking status: Never Smoker  . Smokeless tobacco: Never Used  Substance and Sexual Activity  . Alcohol use: No    Alcohol/week: 0.0 standard drinks    Comment: occasional   . Drug use: No  . Sexual activity: Yes  Lifestyle  . Physical activity    Days per week: 2 days    Minutes per session: 20 min  . Stress: To some extent  Relationships  . Social connections    Talks on phone: More than three times a week    Gets together: More than three times a week    Attends religious service: More than 4 times per year    Active member of club or organization: No    Attends meetings of clubs or organizations: Never    Relationship  status: Patient refused  . Intimate partner violence    Fear of current or ex partner: No    Emotionally abused: No    Physically abused: No    Forced sexual activity: No  Other Topics Concern  . Not on file  Social History Narrative  . Not on file     Current Outpatient Medications:  .  amLODipine (NORVASC) 10 MG tablet, Take 1 tablet (10 mg total) by mouth daily., Disp: 90 tablet, Rfl: 1 .  Calcium Carb-Cholecalciferol (CALCIUM-VITAMIN D3) 500-400 MG-UNIT TABS, Take 1 tablet by mouth 3 (three) times daily., Disp: , Rfl:  .  cyanocobalamin 500 MCG tablet, Take 1 mcg by mouth every morning., Disp: , Rfl:  .  ferrous gluconate ([employer] IRON) 240 (27 FE) MG tablet, Take 1 tablet (240 mg total) by mouth 3 (three) times daily with meals., Disp: 30 tablet, Rfl: 0 .  FLUoxetine (PROZAC) 10 MG capsule, Take 1 capsule (10 mg total) by mouth daily., Disp: 90 capsule, Rfl: 1 .  gabapentin (NEURONTIN) 600 MG tablet, Take 1.5 tablets (900 mg total) by mouth at bedtime., Disp: 135 tablet, Rfl: 1 .  losartan (COZAAR) 100 MG tablet, Take 1 tablet (100 mg total) by mouth daily., Disp: 90 tablet, Rfl: 1 .  Multiple Vitamin (MULTIVITAMIN WITH MINERALS) TABS tablet, Take 1  tablet by mouth 2 (two) times daily., Disp: , Rfl:  .  nystatin (NYSTATIN) powder, Apply topically 4 (four) times daily., Disp: 60 g, Rfl: 2 .  omeprazole (PRILOSEC) 20 MG capsule, Take 1 capsule (20 mg total) by mouth daily., Disp: 90 capsule, Rfl: 1 .  ondansetron (ZOFRAN ODT) 4 MG disintegrating tablet, Take 1 tablet (4 mg total) by mouth every 8 (eight) hours as needed for nausea or vomiting., Disp: 20 tablet, Rfl: 0 .  polyethylene glycol powder (GLYCOLAX/MIRALAX) powder, Take 17 g by mouth 2 (two) times daily as needed., Disp: 3350 g, Rfl: 1 .  pravastatin (PRAVACHOL) 20 MG tablet, Take 1 tablet (20 mg total) by mouth daily., Disp: 90 tablet, Rfl: 1 .  Semaglutide, 1 MG/DOSE, (OZEMPIC, 1 MG/DOSE,) 2 MG/1.5ML SOPN, Inject 1 mg into the skin once a week., Disp: 3 mL, Rfl: 5 .  triamcinolone cream (KENALOG) 0.1 %, Apply topically 2 (two) times daily., Disp: 80 g, Rfl: 0 .  zolpidem (AMBIEN) 10 MG tablet, Take 1 tablet (10 mg total) by mouth at bedtime., Disp: 90 tablet, Rfl: 0  No Known Allergies  I personally reviewed active problem list, medication list, allergies, family history, social history, health maintenance with the patient/caregiver today.   ROS  Constitutional: Negative for fever or significant weight change.  Respiratory: Negative for cough and shortness of breath.   Cardiovascular: Negative for chest pain or palpitations.  Gastrointestinal: Negative for abdominal pain, no bowel changes.  Musculoskeletal: Negative for gait problem or joint swelling.  Skin: Negative for rash.  Neurological: Negative for dizziness , positive for headache.  No other specific complaints in a complete review of systems (except as listed in HPI above).   Objective  Vitals:   05/30/19 1327  BP: 110/80  Pulse: 73  Resp: 16  Temp: (!) 97.3 F (36.3 C)  TempSrc: Temporal  SpO2: 98%  Weight: 150 lb 8 oz (68.3 kg)  Height: 5\' 2"  (1.575 m)    Body mass index is 27.53 kg/m.  Physical  Exam  Constitutional: Patient appears well-developed and well-nourished. Overweight. No distress.  HEENT: head atraumatic, normocephalic, pupils equal and reactive to light  Cardiovascular: Normal rate, regular rhythm and normal heart sounds.  No murmur heard. No BLE edema. Pulmonary/Chest: Effort normal and breath sounds normal. No respiratory distress. Abdominal: Soft.  There is no tenderness. Psychiatric: Patient has a normal mood and affect. behavior is normal. Judgment and thought content normal.  Recent Results (from the past 2160 hour(s))  POCT HgB A1C     Status: Normal   Collection Time: 05/30/19  1:36 PM  Result Value Ref Range   Hemoglobin A1C 5.3 4.0 - 5.6 %   HbA1c POC (<> result, manual entry)     HbA1c, POC (prediabetic range)     HbA1c, POC (controlled diabetic range)       PHQ2/9: Depression screen [facility] 2/9 05/30/2019 05/30/2019 01/24/2019 01/17/2019 09/13/2018  Decreased Interest 0 0 0 0 1  Down, Depressed, Hopeless 0 0 0 0 0  PHQ - 2 Score 0 0 0 0 1  Altered sleeping 0 0 1 0 1  Tired, decreased energy 0 0 1 0 1  Change in appetite 0 0 0 0 0  Feeling bad or failure about yourself  0 0 0 0 0  Trouble concentrating 0 0 0 0 0  Moving slowly or fidgety/restless 0 0 0 0 0  Suicidal thoughts 0 0 0 0 0  PHQ-9 Score 0 0 2 0 3  Difficult doing work/chores - - Not difficult at all Not difficult at all Somewhat difficult  Some recent data might be hidden    phq 9 is negative -she states secondary to stress   Fall Risk: Fall Risk  05/30/2019 05/30/2019 01/17/2019 09/13/2018 05/13/2018  Falls in the past year? 0 0 0 0 No  Number falls in past yr: 0 0 0 0 -  Injury with Fall? 0 0 0 0 -     Functional Status Survey: Is the patient deaf or have difficulty hearing?: No Does the patient have difficulty seeing, even when wearing glasses/contacts?: No Does the patient have difficulty concentrating, remembering, or making decisions?: No Does the patient have difficulty walking or  climbing stairs?: No Does the patient have difficulty dressing or bathing?: No Does the patient have difficulty doing errands alone such as visiting a doctor's office or shopping?: No    Assessment & Plan   1. Controlled type 2 diabetes mellitus with neuropathy (H[facility])  - POCT HgB A1C - Semaglutide, 1 MG/DOSE, (OZEMPIC, 1 MG/DOSE,) 2 MG/1.5ML SOPN; Inject 1 mg into the skin once a week.  Dispense: 3 mL; Refill: 5  2. Hypertension, benign  - amLODipine (NORVASC) 10 MG tablet; Take 1 tablet (10 mg total) by mouth daily.  Dispense: 90 tablet; Refill: 1  3. Raynaud's disease without gangrene  - amLODipine (NORVASC) 10 MG tablet; Take 1 tablet (10 mg total) by mouth daily.  Dispense: 90 tablet; Refill: 1  4. Type 2 diabetes with nephropathy ([place])   5. Mild recurrent major depression ([place])  Under more stress, does not want medication, worried about her son that is at home but is special needs   6. Migraine without aura and without status migrainosus, not intractable  - ondansetron (ZOFRAN ODT) 4 MG disintegrating tablet; Take 1 tablet (4 mg total) by mouth every 8 (eight) hours as needed for nausea or vomiting.  Dispense: 20 tablet; Refill: 0  7. Chronic insomnia  - zolpidem (AMBIEN) 10 MG tablet; Take 1 tablet (10 mg total) by mouth at bedtime.  Dispense: 90 tablet; Refill: 0

## 2019-06-02 ENCOUNTER — Other Ambulatory Visit: Payer: Self-pay | Admitting: Family Medicine

## 2019-06-02 DIAGNOSIS — G43009 Migraine without aura, not intractable, without status migrainosus: Secondary | ICD-10-CM

## 2019-07-30 ENCOUNTER — Encounter: Payer: Self-pay | Admitting: Emergency Medicine

## 2019-07-30 ENCOUNTER — Other Ambulatory Visit: Payer: Self-pay

## 2019-07-30 ENCOUNTER — Ambulatory Visit
Admission: EM | Admit: 2019-07-30 | Discharge: 2019-07-30 | Disposition: A | Discharge status: Home / Self Care | Payer: No Typology Code available for payment source | Attending: Emergency Medicine | Admitting: Emergency Medicine

## 2019-07-30 DIAGNOSIS — T7840XA Allergy, unspecified, initial encounter: Secondary | ICD-10-CM | POA: Diagnosis not present

## 2019-07-30 DIAGNOSIS — R21 Rash and other nonspecific skin eruption: Secondary | ICD-10-CM | POA: Diagnosis not present

## 2019-07-30 MED ORDER — HYDROXYZINE HCL 25 MG PO TABS: 25.0000 mg | ORAL_TABLET | Freq: Four times a day (QID) | ORAL | 0 refills | Status: DC

## 2019-07-30 MED ORDER — PREDNISONE 10 MG (21) PO TBPK: ORAL_TABLET | Freq: Every day | ORAL | 0 refills | Status: DC

## 2019-07-30 NOTE — ED Provider Notes (Signed)
[name]    CSN: [id number] Arrival date & time: 07/30/19  0808      History   Chief Complaint Chief Complaint  Patient presents with  . APPT: 830am  . Allergic Reaction    HPI Shannon Soto is a 48 y.o. female history diabetes, hypertension presenting for scalp irritation, itching, tingling status post hair dye use on Thursday.  Patient had similar reaction 2 years ago when she last had her hair.  States she thought a professional "would do the same thing ".  Patient denies purulent discharge, malodor, hair falling out, fever, headache.    Past Medical History:  Diagnosis Date  . Diabetes mellitus without complication ([place])    improved after gastric sleeve  . GERD (gastroesophageal reflux disease)   . Hyperlipidemia   . Hypertension   . Migraine headache    2x/mo  . Orthodontics    braces  . Raynaud's syndrome     Patient Active Problem List   Diagnosis Date Noted  . Encounter for screening colonoscopy   . Polyp of sigmoid colon   . Controlled type 2 diabetes mellitus with neuropathy ([place]) 05/12/2016  . Raynaud's phenomenon 05/12/2016  . Intertrigo 09/06/2015  . Migraine headache without aura 02/28/2015  . History of obesity 02/28/2015  . Hyperlipidemia 02/28/2015  . GERD (gastroesophageal reflux disease) 02/28/2015  . Vitamin D deficiency 02/28/2015  . Chronic constipation 02/28/2015  . Chronic insomnia 02/28/2015  . Peripheral neuropathy 02/28/2015  . Vitiligo 02/28/2015  . Lower leg mass 02/28/2015  . Depression with anxiety 02/28/2015  . History of bariatric surgery 11/05/2014    Past Surgical History:  Procedure Laterality Date  . ABDOMINAL HYSTERECTOMY    . APPENDECTOMY    . BREAST SURGERY    . BREATH TEK H PYLORI N/A 08/27/2014   Procedure: BREATH TEK H PYLORI;  Surgeon: Pedro Earls, MD;  Location: [facility];  Service: General;  Laterality: N/A;  . COLONOSCOPY WITH PROPOFOL N/A 02/17/2019   Procedure: COLONOSCOPY WITH  PROPOFOL;  Surgeon: Lucilla Lame, MD;  Location: [facility];  Service: Endoscopy;  Laterality: N/A;  . HIATAL HERNIA REPAIR  11/05/2014   Procedure: LAPAROSCOPIC REPAIR OF HIATAL HERNIA;  Surgeon: Johnathan Hausen, MD;  [facility];  Service: General;;  . LAPAROSCOPIC GASTRIC SLEEVE RESECTION N/A 11/05/2014   Procedure: LAPAROSCOPIC GASTRIC SLEEVE RESECTION;  Surgeon: Johnathan Hausen, MD;  Location[facility];  Service: General;  Laterality: N/A;  . POLYPECTOMY  02/17/2019   Procedure: POLYPECTOMY;  Surgeon: Lucilla Lame, MD;  Location: [facility];  Service: Endoscopy;;  . REDUCTION MAMMAPLASTY Bilateral 1997  . UPPER GI ENDOSCOPY  11/05/2014   Procedure: UPPER GI ENDOSCOPY;  Surgeon: Johnathan Hausen, MD;  [facility];  Service: General;;    OB History   No obstetric history on file.      Home Medications    Prior to Admission medications   Medication Sig Start Date End Date Taking? Authorizing Provider  amLODipine (NORVASC) 10 MG tablet Take 1 tablet (10 mg total) by mouth daily. 05/30/19   Steele Sizer, MD  Calcium Carb-Cholecalciferol (CALCIUM-VITAMIN D3) 500-400 MG-UNIT TABS Take 1 tablet by mouth 3 (three) times daily.    [provider]  cyanocobalamin 500 MCG tablet Take 1 mcg by mouth every morning.    [provider]  ferrous gluconate ([employer]S IRON) 240 (27 FE) MG tablet Take 1 tablet (240 mg total) by mouth 3 (three) times daily with meals. 02/28/15  Steele Sizer, MD  FLUoxetine (PROZAC) 10 MG capsule Take 1 capsule (10 mg total) by mouth daily. 01/17/19   Steele Sizer, MD  gabapentin (NEURONTIN) 600 MG tablet Take 1.5 tablets (900 mg total) by mouth at bedtime. 01/17/19   Steele Sizer, MD  hydrOXYzine (ATARAX/VISTARIL) 25 MG tablet Take 1 tablet (25 mg total) by mouth every 6 (six) hours. 07/30/19   Hall-Potvin, [place], PA-C  losartan (COZAAR) 100 MG tablet Take 1 tablet (100 mg total) by mouth daily. 01/17/19   Steele Sizer, MD   Multiple Vitamin (MULTIVITAMIN WITH MINERALS) TABS tablet Take 1 tablet by mouth 2 (two) times daily.    [provider]  nystatin (NYSTATIN) powder Apply topically 4 (four) times daily. 05/13/18   Steele Sizer, MD  omeprazole (PRILOSEC) 20 MG capsule Take 1 capsule (20 mg total) by mouth daily. 01/17/19   Steele Sizer, MD  ondansetron (ZOFRAN ODT) 4 MG disintegrating tablet Take 1 tablet (4 mg total) by mouth every 8 (eight) hours as needed for nausea or vomiting. 05/30/19   Steele Sizer, MD  pravastatin (PRAVACHOL) 20 MG tablet Take 1 tablet (20 mg total) by mouth daily. 01/17/19   Steele Sizer, MD  predniSONE (STERAPRED UNI-PAK 21 TAB) 10 MG (21) TBPK tablet Take by mouth daily. Take steroid taper as written 07/30/19   Hall-Potvin, [place], PA-C  Semaglutide, 1 MG/DOSE, (OZEMPIC, 1 MG/DOSE,) 2 MG/1.5ML SOPN Inject 1 mg into the skin once a week. 05/30/19   Steele Sizer, MD  triamcinolone cream (KENALOG) 0.1 % Apply topically 2 (two) times daily. 09/13/18   Steele Sizer, MD  zolpidem (AMBIEN) 10 MG tablet Take 1 tablet (10 mg total) by mouth at bedtime. 05/30/19   Steele Sizer, MD    Family History Family History  Problem Relation Age of Onset  . Diabetes Paternal Uncle   . Stroke Father   . Hypertension Other   . Breast cancer Maternal Grandmother 65    Social History Social History   Tobacco Use  . Smoking status: Never Smoker  . Smokeless tobacco: Never Used  Substance Use Topics  . Alcohol use: No    Alcohol/week: 0.0 standard drinks    Comment: occasional   . Drug use: No     Allergies   Patient has no known allergies.   Review of Systems Review of Systems  Constitutional: Negative for fatigue and fever.  HENT: Negative for ear pain, sinus pain, sore throat and voice change.   Eyes: Negative for pain, redness and visual disturbance.  Respiratory: Negative for cough and shortness of breath.   Cardiovascular: Negative for chest pain and  palpitations.  Gastrointestinal: Negative for abdominal pain, diarrhea and vomiting.  Musculoskeletal: Negative for arthralgias and myalgias.  Skin: Positive for rash. Negative for wound.  Neurological: Negative for syncope and headaches.     Physical Exam Triage Vital Signs ED Triage Vitals  Enc Vitals Group     BP      Pulse      Resp      Temp      Temp src      SpO2      Weight      Height      Head Circumference      Peak Flow      Pain Score      Pain Loc      Pain Edu?      Excl. in [place]?    No data found.  Updated Vital Signs BP  129/85 (BP Location: Left Arm)   Pulse 69   Temp 97.6 F (36.4 C) (Temporal)   Resp 16   SpO2 98%   Visual Acuity Right Eye Distance:   Left Eye Distance:   Bilateral Distance:    Right Eye Near:   Left Eye Near:    Bilateral Near:     Physical Exam Constitutional:      General: She is not in acute distress.    Appearance: She is normal weight. She is not ill-appearing.  HENT:     Head: Normocephalic and atraumatic.  Eyes:     General: No scleral icterus.    Pupils: Pupils are equal, round, and reactive to light.  Neck:     Musculoskeletal: No muscular tenderness.  Cardiovascular:     Rate and Rhythm: Normal rate.  Pulmonary:     Effort: Pulmonary effort is normal. No respiratory distress.     Breath sounds: No wheezing.  Lymphadenopathy:     Cervical: No cervical adenopathy.  Skin:    Comments: Scalp with diffuse erythematous dry, scaling rash.  Nontender to palpation with minimal edema.  No open wounds, active discharge, warmth, fluctuance.  Neurological:     Mental Status: She is alert and oriented to person, place, and time.      UC Treatments / Results  Labs (all labs ordered are listed, but only abnormal results are displayed) Labs Reviewed - No data to display  EKG   Radiology No results found.  Procedures Procedures (including critical care time)  Medications Ordered in UC Medications - No  data to display  Initial Impression / Assessment and Plan / UC Course  I have reviewed the triage vital signs and the nursing notes.  Pertinent labs & imaging results that were available during my care of the patient were reviewed by me and considered in my medical decision making (see chart for details).     Patient febrile, nontoxic, no acute distress.  We will do prednisone taper, hydroxyzine for additional itch relief as patient is tolerating these well in the past.  Return precautions discussed, patient verbalized understanding and is agreeable to plan. Final Clinical Impressions(s) / UC Diagnoses   Final diagnoses:  Allergic reaction, initial encounter     Discharge Instructions     Steroid taper as prescribed. May take hydroxyzine for additional itching relief: Do not drink, drive while taking this medication, or take Benadryl as this can increase your risk of sleepiness. Return for worsening pain, swelling, fever.    ED Prescriptions    Medication Sig Dispense Auth. Provider   predniSONE (STERAPRED UNI-PAK 21 TAB) 10 MG (21) TBPK tablet Take by mouth daily. Take steroid taper as written 21 tablet Hall-Potvin, [place], PA-C   hydrOXYzine (ATARAX/VISTARIL) 25 MG tablet Take 1 tablet (25 mg total) by mouth every 6 (six) hours. 12 tablet Hall-Potvin, [place], PA-C     PDMP not reviewed this encounter.   Hall-Potvin, [place], [place] 07/30/19 [phone]

## 2019-07-30 NOTE — ED Triage Notes (Signed)
Pt presents to [facility] for assessment of tingling, sores, and allergic reaction to hair dye done Thursday, rash began Friday night.  Hx of similar reaction.

## 2019-07-30 NOTE — ED Notes (Signed)
Patient able to ambulate independently  

## 2019-07-30 NOTE — Discharge Instructions (Signed)
Steroid taper as prescribed. May take hydroxyzine for additional itching relief: Do not drink, drive while taking this medication, or take Benadryl as this can increase your risk of sleepiness. Return for worsening pain, swelling, fever.

## 2019-07-31 ENCOUNTER — Other Ambulatory Visit: Payer: Self-pay

## 2019-07-31 ENCOUNTER — Encounter: Payer: Self-pay | Admitting: Emergency Medicine

## 2019-07-31 ENCOUNTER — Ambulatory Visit
Admission: EM | Admit: 2019-07-31 | Discharge: 2019-07-31 | Disposition: A | Discharge status: Home / Self Care | Payer: No Typology Code available for payment source | Attending: Family Medicine | Admitting: Family Medicine

## 2019-07-31 DIAGNOSIS — T7840XA Allergy, unspecified, initial encounter: Secondary | ICD-10-CM

## 2019-07-31 MED ORDER — DEXAMETHASONE SODIUM PHOSPHATE 10 MG/ML IJ SOLN
10.0000 mg | Freq: Once | INTRAMUSCULAR | Status: AC
  Administered 2019-07-31: 17:00:00 10 mg via INTRAMUSCULAR

## 2019-07-31 MED ORDER — FLUCONAZOLE 150 MG PO TABS: 150.0000 mg | ORAL_TABLET | Freq: Every day | ORAL | 0 refills | Status: DC

## 2019-07-31 MED ORDER — CEPHALEXIN 500 MG PO CAPS: 500.0000 mg | ORAL_CAPSULE | Freq: Four times a day (QID) | ORAL | 0 refills | Status: DC

## 2019-07-31 NOTE — Discharge Instructions (Addendum)
Steroid injection given here in clinic.  Continue taking the prednisone daily. If your symptoms worsen or do not improve by the end of the week please give [place] a call

## 2019-07-31 NOTE — ED Provider Notes (Signed)
Roderic Palau    CSN: [id number] Arrival date & time: 07/31/19  1625      History   Chief Complaint Chief Complaint  Patient presents with  . Rash    HPI Shannon Soto is a 48 y.o. female.   Patient is a 48 year old female who presents today for worsening symptoms due to allergic reaction from hair dye.  She was seen yesterday and given prednisone taper which she started yesterday.  She has taken 2 doses of the steroid taper.  Woke up this morning with facial swelling around the left eye  and oozing from the scalp.  Reporting moderate pain to scalp with irritation.  Unable to take the hydroxyzine at work due to making her drowsy.  Has felt mildly feverish.  She has low-grade temp here of 99.3 and is mildly tachycardic.  Patient very upset about situation.  ROS per HPI      Past Medical History:  Diagnosis Date  . Diabetes mellitus without complication ([place])    improved after gastric sleeve  . GERD (gastroesophageal reflux disease)   . Hyperlipidemia   . Hypertension   . Migraine headache    2x/mo  . Orthodontics    braces  . Raynaud's syndrome     Patient Active Problem List   Diagnosis Date Noted  . Encounter for screening colonoscopy   . Polyp of sigmoid colon   . Controlled type 2 diabetes mellitus with neuropathy ([place]) 05/12/2016  . Raynaud's phenomenon 05/12/2016  . Intertrigo 09/06/2015  . Migraine headache without aura 02/28/2015  . History of obesity 02/28/2015  . Hyperlipidemia 02/28/2015  . GERD (gastroesophageal reflux disease) 02/28/2015  . Vitamin D deficiency 02/28/2015  . Chronic constipation 02/28/2015  . Chronic insomnia 02/28/2015  . Peripheral neuropathy 02/28/2015  . Vitiligo 02/28/2015  . Lower leg mass 02/28/2015  . Depression with anxiety 02/28/2015  . History of bariatric surgery 11/05/2014    Past Surgical History:  Procedure Laterality Date  . ABDOMINAL HYSTERECTOMY    . APPENDECTOMY    . BREAST SURGERY    .  BREATH TEK H PYLORI N/A 08/27/2014   Procedure: BREATH TEK H PYLORI;  Surgeon: Pedro Earls, MD;  Location: [facility];  Service: General;  Laterality: N/A;  . COLONOSCOPY WITH PROPOFOL N/A 02/17/2019   Procedure: COLONOSCOPY WITH PROPOFOL;  Surgeon: Lucilla Lame, MD;  Location: [facility];  Service: Endoscopy;  Laterality: N/A;  . HIATAL HERNIA REPAIR  11/05/2014   Procedure: LAPAROSCOPIC REPAIR OF HIATAL HERNIA;  Surgeon: Johnathan Hausen, MD;  Location: [facility];  Service: General;;  . LAPAROSCOPIC GASTRIC SLEEVE RESECTION N/A 11/05/2014   Procedure: LAPAROSCOPIC GASTRIC SLEEVE RESECTION;  Surgeon: Johnathan Hausen, MD;  Location: [facility];  Service: General;  Laterality: N/A;  . POLYPECTOMY  02/17/2019   Procedure: POLYPECTOMY;  Surgeon: Lucilla Lame, MD;  Location: [facility];  Service: Endoscopy;;  . REDUCTION MAMMAPLASTY Bilateral 1997  . UPPER GI ENDOSCOPY  11/05/2014   Procedure: UPPER GI ENDOSCOPY;  Surgeon: Johnathan Hausen, MD;  Location: [facility];  Service: General;;    OB History   No obstetric history on file.      Home Medications    Prior to Admission medications   Medication Sig Start Date End Date Taking? Authorizing Provider  amLODipine (NORVASC) 10 MG tablet Take 1 tablet (10 mg total) by mouth daily. 05/30/19   Steele Sizer, MD  Calcium Carb-Cholecalciferol (CALCIUM-VITAMIN D3) 500-400 MG-UNIT TABS Take 1 tablet by mouth 3 (three)  times daily.    [provider]  cephALEXin (KEFLEX) 500 MG capsule Take 1 capsule (500 mg total) by mouth 4 (four) times daily. 07/31/19   Loura Halt A, NP  cyanocobalamin 500 MCG tablet Take 1 mcg by mouth every morning.    [provider]  ferrous gluconate ([facility] IRON) 240 (27 FE) MG tablet Take 1 tablet (240 mg total) by mouth 3 (three) times daily with meals. 02/28/15   Steele Sizer, MD  fluconazole (DIFLUCAN) 150 MG tablet Take 1 tablet (150 mg total) by mouth daily. 07/31/19   Loura Halt A, NP  FLUoxetine  (PROZAC) 10 MG capsule Take 1 capsule (10 mg total) by mouth daily. 01/17/19   Steele Sizer, MD  gabapentin (NEURONTIN) 600 MG tablet Take 1.5 tablets (900 mg total) by mouth at bedtime. 01/17/19   Steele Sizer, MD  hydrOXYzine (ATARAX/VISTARIL) 25 MG tablet Take 1 tablet (25 mg total) by mouth every 6 (six) hours. 07/30/19   Hall-Potvin, [place], PA-C  losartan (COZAAR) 100 MG tablet Take 1 tablet (100 mg total) by mouth daily. 01/17/19   Steele Sizer, MD  Multiple Vitamin (MULTIVITAMIN WITH MINERALS) TABS tablet Take 1 tablet by mouth 2 (two) times daily.    [provider]  nystatin (NYSTATIN) powder Apply topically 4 (four) times daily. 05/13/18   Steele Sizer, MD  omeprazole (PRILOSEC) 20 MG capsule Take 1 capsule (20 mg total) by mouth daily. 01/17/19   Steele Sizer, MD  ondansetron (ZOFRAN ODT) 4 MG disintegrating tablet Take 1 tablet (4 mg total) by mouth every 8 (eight) hours as needed for nausea or vomiting. 05/30/19   Steele Sizer, MD  pravastatin (PRAVACHOL) 20 MG tablet Take 1 tablet (20 mg total) by mouth daily. 01/17/19   Steele Sizer, MD  predniSONE (STERAPRED UNI-PAK 21 TAB) 10 MG (21) TBPK tablet Take by mouth daily. Take steroid taper as written 07/30/19   Hall-Potvin, [place], PA-C  Semaglutide, 1 MG/DOSE, (OZEMPIC, 1 MG/DOSE,) 2 MG/1.5ML SOPN Inject 1 mg into the skin once a week. 05/30/19   Steele Sizer, MD  triamcinolone cream (KENALOG) 0.1 % Apply topically 2 (two) times daily. 09/13/18   Steele Sizer, MD  zolpidem (AMBIEN) 10 MG tablet Take 1 tablet (10 mg total) by mouth at bedtime. 05/30/19   Steele Sizer, MD    Family History Family History  Problem Relation Age of Onset  . Diabetes Paternal Uncle   . Stroke Father   . Hypertension Other   . Breast cancer Maternal Grandmother 48    Social History Social History   Tobacco Use  . Smoking status: Never Smoker  . Smokeless tobacco: Never Used  Substance Use Topics  . Alcohol use: No     Alcohol/week: 0.0 standard drinks    Comment: occasional   . Drug use: No     Allergies   Patient has no known allergies.   Review of Systems Review of Systems   Physical Exam Triage Vital Signs ED Triage Vitals  Enc Vitals Group     BP 07/31/19 1640 130/88     Pulse Rate 07/31/19 1640 (!) 105     Resp 07/31/19 1640 18     Temp 07/31/19 1640 99.3 F (37.4 C)     Temp Source 07/31/19 1640 Oral     SpO2 07/31/19 1644 98 %     Weight 07/31/19 1638 141 lb (64 kg)     Height --      Head Circumference --  Peak Flow --      Pain Score 07/31/19 1638 4     Pain Loc --      Pain Edu? --      Excl. in [place]? --    No data found.  Updated Vital Signs BP 130/88   Pulse (!) 105   Temp 99.3 F (37.4 C) (Oral)   Resp 18   Wt 141 lb (64 kg)   SpO2 98%   BMI 25.79 kg/m   Visual Acuity Right Eye Distance:   Left Eye Distance:   Bilateral Distance:    Right Eye Near:   Left Eye Near:    Bilateral Near:     Physical Exam Vitals signs and nursing note reviewed.  Constitutional:      General: She is not in acute distress.    Appearance: Normal appearance. She is not ill-appearing, toxic-appearing or diaphoretic.  HENT:     Head: Normocephalic.     Nose: Nose normal.     Mouth/Throat:     Pharynx: Oropharynx is clear.  Eyes:     Conjunctiva/sclera: Conjunctivae normal.  Neck:     Musculoskeletal: Normal range of motion.  Pulmonary:     Effort: Pulmonary effort is normal.  Musculoskeletal: Normal range of motion.  Skin:    General: Skin is warm and dry.     Findings: Rash present.     Comments: Dry, scaly, erythematous rash generalized throughout entire scalp with some open areas.  Mild left facial swelling around left eye Tender to touch.   Neurological:     Mental Status: She is alert.  Psychiatric:        Mood and Affect: Mood normal.      UC Treatments / Results  Labs (all labs ordered are listed, but only abnormal results are displayed) Labs  Reviewed - No data to display  EKG   Radiology No results found.  Procedures Procedures (including critical care time)  Medications Ordered in UC Medications  dexamethasone (DECADRON) injection 10 mg (10 mg Intramuscular Given 07/31/19 1707)    Initial Impression / Assessment and Plan / UC Course  I have reviewed the triage vital signs and the nursing notes.  Pertinent labs & imaging results that were available during my care of the patient were reviewed by me and considered in my medical decision making (see chart for details).     Allergic reaction- pt having worsening symptoms despite prednisone Will give steroid injection as requested.  Will have her continue the prednisone taper.  Sent in prescription for Keflex, in case of secondary infection,  to start taking if symptoms have not improved or have worsened by the end of the week. Follow up as needed for continued or worsening symptoms  Final Clinical Impressions(s) / UC Diagnoses   Final diagnoses:  Allergic reaction, initial encounter     Discharge Instructions     Steroid injection given here in clinic.  Continue taking the prednisone daily. If your symptoms worsen or do not improve by the end of the week please give [place] a call    ED Prescriptions    Medication Sig Dispense Auth. Provider   cephALEXin (KEFLEX) 500 MG capsule Take 1 capsule (500 mg total) by mouth 4 (four) times daily. 28 capsule Breely Panik A, NP   fluconazole (DIFLUCAN) 150 MG tablet Take 1 tablet (150 mg total) by mouth daily. 2 tablet Loura Halt A, NP     PDMP not reviewed this encounter.   Rozanna Box,  Leopoldo Mazzie A, NP 07/31/19 1711

## 2019-07-31 NOTE — ED Triage Notes (Signed)
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

## 2019-08-31 ENCOUNTER — Other Ambulatory Visit: Payer: Self-pay | Admitting: Family Medicine

## 2019-08-31 ENCOUNTER — Encounter: Payer: Self-pay | Admitting: Family Medicine

## 2019-08-31 DIAGNOSIS — E1140 Type 2 diabetes mellitus with diabetic neuropathy, unspecified: Secondary | ICD-10-CM

## 2019-08-31 DIAGNOSIS — G629 Polyneuropathy, unspecified: Secondary | ICD-10-CM

## 2019-08-31 MED ORDER — GABAPENTIN 600 MG PO TABS: 900.0000 mg | ORAL_TABLET | Freq: Every day | ORAL | 1 refills | Status: DC

## 2019-09-11 ENCOUNTER — Other Ambulatory Visit: Payer: Self-pay

## 2019-09-11 ENCOUNTER — Encounter: Payer: Self-pay | Admitting: Family Medicine

## 2019-09-11 ENCOUNTER — Other Ambulatory Visit: Payer: Self-pay | Admitting: Family Medicine

## 2019-09-11 DIAGNOSIS — K2100 Gastro-esophageal reflux disease with esophagitis, without bleeding: Secondary | ICD-10-CM

## 2019-09-11 MED ORDER — OMEPRAZOLE 40 MG PO CPDR: 40.0000 mg | DELAYED_RELEASE_CAPSULE | Freq: Every day | ORAL | 0 refills | Status: DC

## 2019-09-12 ENCOUNTER — Encounter: Payer: Self-pay | Admitting: Ophthalmology

## 2019-09-12 ENCOUNTER — Other Ambulatory Visit: Payer: Self-pay

## 2019-09-18 ENCOUNTER — Other Ambulatory Visit
Admission: RE | Admit: 2019-09-18 | Discharge: 2019-09-18 | Disposition: A | Discharge status: Home / Self Care | Payer: No Typology Code available for payment source | Source: Ambulatory Visit | Attending: Ophthalmology | Admitting: Ophthalmology

## 2019-09-18 ENCOUNTER — Other Ambulatory Visit: Payer: Self-pay

## 2019-09-18 DIAGNOSIS — Z01812 Encounter for preprocedural laboratory examination: Secondary | ICD-10-CM | POA: Diagnosis present

## 2019-09-18 DIAGNOSIS — Z1383 Encounter for screening for respiratory disorder NEC: Secondary | ICD-10-CM | POA: Diagnosis not present

## 2019-09-18 DIAGNOSIS — Z20822 Contact with and (suspected) exposure to covid-19: Secondary | ICD-10-CM | POA: Insufficient documentation

## 2019-09-18 NOTE — Discharge Instructions (Signed)

## 2019-09-19 LAB — SARS CORONAVIRUS 2 (TAT 6-24 HRS): SARS Coronavirus 2: NEGATIVE

## 2019-09-20 ENCOUNTER — Ambulatory Visit: Payer: No Typology Code available for payment source | Admitting: Anesthesiology

## 2019-09-20 ENCOUNTER — Other Ambulatory Visit: Payer: Self-pay

## 2019-09-20 ENCOUNTER — Encounter
Admission: RE | Disposition: A | Discharge status: Home / Self Care | Payer: Self-pay | Source: Home / Self Care | Attending: Ophthalmology

## 2019-09-20 ENCOUNTER — Ambulatory Visit
Admission: RE | Admit: 2019-09-20 | Discharge: 2019-09-20 | Disposition: A | Discharge status: Home / Self Care | Payer: No Typology Code available for payment source | Attending: Ophthalmology | Admitting: Ophthalmology

## 2019-09-20 ENCOUNTER — Encounter: Payer: Self-pay | Admitting: Ophthalmology

## 2019-09-20 DIAGNOSIS — F419 Anxiety disorder, unspecified: Secondary | ICD-10-CM | POA: Insufficient documentation

## 2019-09-20 DIAGNOSIS — D649 Anemia, unspecified: Secondary | ICD-10-CM | POA: Diagnosis not present

## 2019-09-20 DIAGNOSIS — I7300 Raynaud's syndrome without gangrene: Secondary | ICD-10-CM | POA: Insufficient documentation

## 2019-09-20 DIAGNOSIS — K219 Gastro-esophageal reflux disease without esophagitis: Secondary | ICD-10-CM | POA: Diagnosis not present

## 2019-09-20 DIAGNOSIS — F329 Major depressive disorder, single episode, unspecified: Secondary | ICD-10-CM | POA: Insufficient documentation

## 2019-09-20 DIAGNOSIS — E7800 Pure hypercholesterolemia, unspecified: Secondary | ICD-10-CM | POA: Insufficient documentation

## 2019-09-20 DIAGNOSIS — I10 Essential (primary) hypertension: Secondary | ICD-10-CM | POA: Insufficient documentation

## 2019-09-20 DIAGNOSIS — Z79899 Other long term (current) drug therapy: Secondary | ICD-10-CM | POA: Insufficient documentation

## 2019-09-20 DIAGNOSIS — Z951 Presence of aortocoronary bypass graft: Secondary | ICD-10-CM | POA: Diagnosis not present

## 2019-09-20 DIAGNOSIS — H2512 Age-related nuclear cataract, left eye: Secondary | ICD-10-CM | POA: Insufficient documentation

## 2019-09-20 HISTORY — PX: CATARACT EXTRACTION W/PHACO: SHX586

## 2019-09-20 SURGERY — PHACOEMULSIFICATION, CATARACT, WITH IOL INSERTION
Anesthesia: Monitor Anesthesia Care | Site: Eye | Laterality: Left

## 2019-09-20 MED ORDER — LACTATED RINGERS IV SOLN: 100.0000 mL/h | INTRAVENOUS | Status: DC

## 2019-09-20 MED ORDER — NA HYALUR & NA CHOND-NA HYALUR 0.4-0.35 ML IO KIT
PACK | INTRAOCULAR | Status: DC | PRN
  Administered 2019-09-20: 1 mL via INTRAOCULAR

## 2019-09-20 MED ORDER — LIDOCAINE HCL (PF) 2 % IJ SOLN
INTRAOCULAR | Status: DC | PRN
  Administered 2019-09-20: 13:00:00 2 mL

## 2019-09-20 MED ORDER — ONDANSETRON HCL 4 MG/2ML IJ SOLN: 4.0000 mg | Freq: Once | INTRAMUSCULAR | Status: DC | PRN

## 2019-09-20 MED ORDER — ACETAMINOPHEN 325 MG PO TABS: 325.0000 mg | ORAL_TABLET | ORAL | Status: DC | PRN

## 2019-09-20 MED ORDER — BRIMONIDINE TARTRATE-TIMOLOL 0.2-0.5 % OP SOLN
OPHTHALMIC | Status: DC | PRN
  Administered 2019-09-20: 1 [drp] via OPHTHALMIC

## 2019-09-20 MED ORDER — MIDAZOLAM HCL 2 MG/2ML IJ SOLN
INTRAMUSCULAR | Status: DC | PRN
  Administered 2019-09-20: 2 mg via INTRAVENOUS

## 2019-09-20 MED ORDER — FENTANYL CITRATE (PF) 100 MCG/2ML IJ SOLN
INTRAMUSCULAR | Status: DC | PRN
  Administered 2019-09-20: 100 ug via INTRAVENOUS

## 2019-09-20 MED ORDER — TETRACAINE HCL 0.5 % OP SOLN
1.0000 [drp] | OPHTHALMIC | Status: DC | PRN
  Administered 2019-09-20 (×3): 1 [drp] via OPHTHALMIC

## 2019-09-20 MED ORDER — MOXIFLOXACIN HCL 0.5 % OP SOLN
1.0000 [drp] | OPHTHALMIC | Status: DC | PRN
  Administered 2019-09-20 (×3): 1 [drp] via OPHTHALMIC

## 2019-09-20 MED ORDER — ARMC OPHTHALMIC DILATING DROPS
1.0000 "application " | OPHTHALMIC | Status: DC | PRN
  Administered 2019-09-20 (×3): 1 via OPHTHALMIC

## 2019-09-20 MED ORDER — CEFUROXIME OPHTHALMIC INJECTION 1 MG/0.1 ML
INJECTION | OPHTHALMIC | Status: DC | PRN
  Administered 2019-09-20: 0.1 mL via INTRACAMERAL

## 2019-09-20 MED ORDER — EPINEPHRINE PF 1 MG/ML IJ SOLN
INTRAOCULAR | Status: DC | PRN
  Administered 2019-09-20: 40 mL via OPHTHALMIC

## 2019-09-20 MED ORDER — ACETAMINOPHEN 160 MG/5ML PO SOLN: 325.0000 mg | ORAL | Status: DC | PRN

## 2019-09-20 SURGICAL SUPPLY — 19 items
CANNULA ANT/CHMB 27G (MISCELLANEOUS) ×1 IMPLANT
CANNULA ANT/CHMB 27GA (MISCELLANEOUS) ×3 IMPLANT
GLOVE SURG LX 7.5 STRW (GLOVE) ×2
GLOVE SURG LX STRL 7.5 STRW (GLOVE) ×1 IMPLANT
GLOVE SURG TRIUMPH 8.0 PF LTX (GLOVE) ×3 IMPLANT
GOWN STRL REUS W/ TWL LRG LVL3 (GOWN DISPOSABLE) ×2 IMPLANT
GOWN STRL REUS W/TWL LRG LVL3 (GOWN DISPOSABLE) ×6
LENS IOL TECNIS ITEC 20.0 (Intraocular Lens) ×2 IMPLANT
MARKER SKIN DUAL TIP RULER LAB (MISCELLANEOUS) ×3 IMPLANT
NDL FILTER BLUNT 18X1 1/2 (NEEDLE) IMPLANT
NEEDLE FILTER BLUNT 18X 1/2SAF (NEEDLE) ×4
NEEDLE FILTER BLUNT 18X1 1/2 (NEEDLE) ×2 IMPLANT
PACK CATARACT BRASINGTON (MISCELLANEOUS) ×3 IMPLANT
PACK EYE AFTER SURG (MISCELLANEOUS) ×3 IMPLANT
PACK OPTHALMIC (MISCELLANEOUS) ×3 IMPLANT
SYR 3ML LL SCALE MARK (SYRINGE) ×5 IMPLANT
SYR TB 1ML LUER SLIP (SYRINGE) ×3 IMPLANT
WATER STERILE IRR 250ML POUR (IV SOLUTION) ×2 IMPLANT
WIPE NON LINTING 3.25X3.25 (MISCELLANEOUS) ×3 IMPLANT

## 2019-09-20 NOTE — Op Note (Signed)
OPERATIVE NOTE  Shannon Soto [id number] 09/20/2019   PREOPERATIVE DIAGNOSIS:  Nuclear sclerotic cataract left eye. H25.12   POSTOPERATIVE DIAGNOSIS:    Nuclear sclerotic cataract left eye.     PROCEDURE:  Phacoemusification with posterior chamber intraocular lens placement of the left eye  Ultrasound time: Procedure(s): CATARACT EXTRACTION PHACO AND INTRAOCULAR LENS PLACEMENT (IOC) LEFT 0.74 00:12.3 6.0% (Left)  LENS:   Implant Name Type Inv. Item Serial No. Manufacturer Lot No. LRB No. Used Action  LENS IOL DIOP 20.0 - [id number] Intraocular Lens LENS IOL DIOP 20.0 [id number]  Left 1 Implanted      SURGEON:  Wyonia Hough, MD   ANESTHESIA:  Topical with tetracaine drops and 2% Xylocaine jelly, augmented with 1% preservative-free intracameral lidocaine.    COMPLICATIONS:  None.   DESCRIPTION OF PROCEDURE:  The patient was identified in the holding room and transported to the operating room and placed in the supine position under the operating microscope.  The left eye was identified as the operative eye and it was prepped and draped in the usual sterile ophthalmic fashion.   A 1 millimeter clear-corneal paracentesis was made at the 1:30 position.  0.5 ml of preservative-free 1% lidocaine was injected into the anterior chamber.  The anterior chamber was filled with Viscoat viscoelastic.  A 2.4 millimeter keratome was used to make a near-clear corneal incision at the 10:30 position.  .  A curvilinear capsulorrhexis was made with a cystotome and capsulorrhexis forceps.  Balanced salt solution was used to hydrodissect and hydrodelineate the nucleus.   Phacoemulsification was then used in stop and chop fashion to remove the lens nucleus and epinucleus.  The remaining cortex was then removed using the irrigation and aspiration handpiece. Provisc was then placed into the capsular bag to distend it for lens placement.  A lens was then injected into the capsular bag.  The  remaining viscoelastic was aspirated.   Wounds were hydrated with balanced salt solution.  The anterior chamber was inflated to a physiologic pressure with balanced salt solution.  No wound leaks were noted. Cefuroxime 0.1 ml of a 10mg /ml solution was injected into the anterior chamber for a dose of 1 mg of intracameral antibiotic at the completion of the case.   Timolol and Brimonidine drops were applied to the eye.  The patient was taken to the recovery room in stable condition without complications of anesthesia or surgery.  [name] 09/20/2019, 1:01 PM

## 2019-09-20 NOTE — Transfer of Care (Signed)
Immediate Anesthesia Transfer of Care Note  Patient: Shannon Soto  Procedure(s) Performed: CATARACT EXTRACTION PHACO AND INTRAOCULAR LENS PLACEMENT (IOC) LEFT 0.74 00:12.3 6.0% (Left Eye)  Patient Location: PACU  Anesthesia Type: MAC  Level of Consciousness: awake, alert  and patient cooperative  Airway and Oxygen Therapy: Patient Spontanous Breathing and Patient connected to supplemental oxygen  Post-op Assessment: Post-op Vital signs reviewed, Patient's Cardiovascular Status Stable, Respiratory Function Stable, Patent Airway and No signs of Nausea or vomiting  Post-op Vital Signs: Reviewed and stable  Complications: No apparent anesthesia complications

## 2019-09-20 NOTE — Anesthesia Postprocedure Evaluation (Signed)
Anesthesia Post Note  Patient: Shannon Soto  Procedure(s) Performed: CATARACT EXTRACTION PHACO AND INTRAOCULAR LENS PLACEMENT (IOC) LEFT 0.74 00:12.3 6.0% (Left Eye)     Patient location during evaluation: Phase II Anesthesia Type: MAC Level of consciousness: awake and alert and oriented Pain management: pain level controlled Vital Signs Assessment: post-procedure vital signs reviewed and stable Respiratory status: spontaneous breathing, nonlabored ventilation and respiratory function stable Cardiovascular status: blood pressure returned to baseline and stable Postop Assessment: no headache and no backache Anesthetic complications: no    Sinda Du

## 2019-09-20 NOTE — Anesthesia Preprocedure Evaluation (Signed)
Anesthesia Evaluation  Patient identified by MRN, date of birth, ID band Patient awake    Reviewed: Allergy & Precautions, NPO status , Patient's Chart, lab work & pertinent test results  History of Anesthesia Complications Negative for: history of anesthetic complications  Airway Mallampati: II  TM Distance: >3 FB Neck ROM: Full    Dental no notable dental hx.    Pulmonary    Pulmonary exam normal breath sounds clear to auscultation       Cardiovascular Exercise Tolerance: Good hypertension, Normal cardiovascular exam Rhythm:Regular Rate:Normal     Neuro/Psych  Headaches, PSYCHIATRIC DISORDERS Anxiety Depression  Neuromuscular disease    GI/Hepatic Neg liver ROS, GERD  ,  Endo/Other  diabetes  Renal/GU negative Renal ROS  negative genitourinary   Musculoskeletal negative musculoskeletal ROS (+)   Abdominal   Peds  Hematology negative hematology ROS (+)   Anesthesia Other Findings   Reproductive/Obstetrics negative OB ROS                             Anesthesia Physical Anesthesia Plan  ASA: II  Anesthesia Plan: MAC   Post-op Pain Management:    Induction: Intravenous  PONV Risk Score and Plan: 2 and TIVA, Midazolam and Treatment may vary due to age or medical condition  Airway Management Planned: Natural Airway  Additional Equipment:   Intra-op Plan:   Post-operative Plan:   Informed Consent: I have reviewed the patients History and Physical, chart, labs and discussed the procedure including the risks, benefits and alternatives for the proposed anesthesia with the patient or authorized representative who has indicated his/her understanding and acceptance.     Dental advisory given  Plan Discussed with: CRNA and Anesthesiologist  Anesthesia Plan Comments:         Anesthesia Quick Evaluation  Patient Active Problem List   Diagnosis Date Noted  . Encounter for  screening colonoscopy   . Polyp of sigmoid colon   . Controlled type 2 diabetes mellitus with neuropathy ([place]) 05/12/2016  . Raynaud's phenomenon 05/12/2016  . Intertrigo 09/06/2015  . Migraine headache without aura 02/28/2015  . History of obesity 02/28/2015  . Hyperlipidemia 02/28/2015  . GERD (gastroesophageal reflux disease) 02/28/2015  . Vitamin D deficiency 02/28/2015  . Chronic constipation 02/28/2015  . Chronic insomnia 02/28/2015  . Peripheral neuropathy 02/28/2015  . Vitiligo 02/28/2015  . Lower leg mass 02/28/2015  . Depression with anxiety 02/28/2015  . History of bariatric surgery 11/05/2014   CBC Latest Ref Rng & Units 01/24/2019 02/08/2018 12/08/2016  WBC 3.8 - 10.8 Thousand/uL 7.6 7.0 7.9  Hemoglobin 11.7 - 15.5 g/dL 12.2 12.6 13.0  Hematocrit 35.0 - 45.0 % 39.1 36.7 41.7  Platelets 140 - 400 Thousand/uL 365 339 346   BMP Latest Ref Rng & Units 01/24/2019 02/08/2018 12/08/2016  Glucose 65 - 99 mg/dL 84 80 103(H)  BUN 7 - 25 mg/dL _0 Creatinine 0.50 - 1.05 mg/dL 0.72 0.52 0.70  BUN/Creat Ratio 6 - 22 (calc) NOT APPLICABLE - -  Sodium 469 - 146 mmol/L 140 140 137  Potassium 3.5 - 5.3 mmol/L 4.0 3.3(L) 3.5  Chloride 98 - 110 mmol/L 104 105 104  CO2 20 - 32 mmol/L _1 Calcium 8.6 - 10.4 mg/dL 9.2 8.4(L) 8.5(L)   Risks and benefits of anesthesia discussed at length, patient or surrogate demonstrates understanding. Appropriately NPO. Plan to proceed with anesthesia.  Champ Mungo, MD 09/20/19

## 2019-09-20 NOTE — H&P (Signed)

## 2019-09-21 ENCOUNTER — Encounter: Payer: Self-pay | Admitting: *Deleted

## 2019-09-28 ENCOUNTER — Ambulatory Visit: Payer: No Typology Code available for payment source | Attending: Internal Medicine

## 2019-09-28 DIAGNOSIS — Z23 Encounter for immunization: Secondary | ICD-10-CM | POA: Insufficient documentation

## 2019-10-03 ENCOUNTER — Encounter: Payer: Self-pay | Admitting: Family Medicine

## 2019-10-03 ENCOUNTER — Ambulatory Visit (INDEPENDENT_AMBULATORY_CARE_PROVIDER_SITE_OTHER): Payer: No Typology Code available for payment source | Admitting: Family Medicine

## 2019-10-03 ENCOUNTER — Other Ambulatory Visit: Payer: Self-pay

## 2019-10-03 VITALS — BP 110/78 | HR 104 | Temp 96.9°F | Resp 16 | Ht 62.0 in | Wt 150.4 lb

## 2019-10-03 DIAGNOSIS — I7300 Raynaud's syndrome without gangrene: Secondary | ICD-10-CM

## 2019-10-03 DIAGNOSIS — L80 Vitiligo: Secondary | ICD-10-CM

## 2019-10-03 DIAGNOSIS — E1121 Type 2 diabetes mellitus with diabetic nephropathy: Secondary | ICD-10-CM

## 2019-10-03 DIAGNOSIS — K219 Gastro-esophageal reflux disease without esophagitis: Secondary | ICD-10-CM

## 2019-10-03 DIAGNOSIS — G43009 Migraine without aura, not intractable, without status migrainosus: Secondary | ICD-10-CM

## 2019-10-03 DIAGNOSIS — E1140 Type 2 diabetes mellitus with diabetic neuropathy, unspecified: Secondary | ICD-10-CM

## 2019-10-03 DIAGNOSIS — E559 Vitamin D deficiency, unspecified: Secondary | ICD-10-CM

## 2019-10-03 DIAGNOSIS — I10 Essential (primary) hypertension: Secondary | ICD-10-CM | POA: Diagnosis not present

## 2019-10-03 DIAGNOSIS — L308 Other specified dermatitis: Secondary | ICD-10-CM

## 2019-10-03 DIAGNOSIS — Z9884 Bariatric surgery status: Secondary | ICD-10-CM

## 2019-10-03 DIAGNOSIS — F5104 Psychophysiologic insomnia: Secondary | ICD-10-CM

## 2019-10-03 DIAGNOSIS — F330 Major depressive disorder, recurrent, mild: Secondary | ICD-10-CM

## 2019-10-03 DIAGNOSIS — G629 Polyneuropathy, unspecified: Secondary | ICD-10-CM | POA: Diagnosis not present

## 2019-10-03 DIAGNOSIS — E538 Deficiency of other specified B group vitamins: Secondary | ICD-10-CM

## 2019-10-03 LAB — POCT GLYCOSYLATED HEMOGLOBIN (HGB A1C): Hemoglobin A1C: 5.2 % (ref 4.0–5.6)

## 2019-10-03 MED ORDER — ONDANSETRON 4 MG PO TBDP: 4.0000 mg | ORAL_TABLET | Freq: Three times a day (TID) | ORAL | 0 refills | Status: DC | PRN

## 2019-10-03 MED ORDER — AMLODIPINE BESYLATE 10 MG PO TABS: 10.0000 mg | ORAL_TABLET | Freq: Every day | ORAL | 1 refills | Status: DC

## 2019-10-03 MED ORDER — OMEPRAZOLE 40 MG PO CPDR: 40.0000 mg | DELAYED_RELEASE_CAPSULE | Freq: Every day | ORAL | 0 refills | Status: DC

## 2019-10-03 MED ORDER — HYDROXYZINE HCL 10 MG PO TABS: 10.0000 mg | ORAL_TABLET | Freq: Every day | ORAL | 0 refills | Status: DC

## 2019-10-03 MED ORDER — TRIAMCINOLONE ACETONIDE 0.1 % EX CREA: TOPICAL_CREAM | Freq: Two times a day (BID) | CUTANEOUS | 0 refills | Status: DC

## 2019-10-03 MED ORDER — ZOLPIDEM TARTRATE 10 MG PO TABS: 10.0000 mg | ORAL_TABLET | Freq: Every day | ORAL | 0 refills | Status: DC

## 2019-10-03 NOTE — Progress Notes (Addendum)
Name: Shannon Soto   MRN: [id number]    DOB: Jul 16, 1971   Date:10/03/2019       Progress Note  Subjective  Chief Complaint  Chief Complaint  Patient presents with  . Diabetes  . Depression  . Hypertension  . Hyperlipidemia    HPI  Bariatric Surgery: she had sleeve surgery done by Dr. Hassell Done on March 14th, 2016, she has achieved her goal weight of145 lbs,today weight is up to 150 lbs, she states she is very stressed, skipping meals and not as active since COVI-19 Weight prior to surgery was 242 lbsShe is doing well,hgbA1C started to go up, and we started her on Ozempic 07/2018weight was in the 140 range until Fall 2020 when weight went up to 150 lbs  She has recurrent rash under breast( but that part is better with topical medication)and abdominal folds from the significant weight loss and has to use Nystatin prn and medication helps,she states still has burning sensation, medication seems to help but sometimes it has flares with redness and irritation.She is considering tummy tuck to help with the rash   Eczema: she has recurrent symptoms during winter months when skin is dry, triamcinolone helps and she needs a refill  GERD: she has a long history of reflux, however about 3-4 weeks ago symptoms got worse, she was on Omeprazole 20 mg and we increased to 40 mg and since on higher dose she is doing better, no recent episodes of heartburn or regurgitation   Hyperlipidemia: she is off Pravastatin since surgery. had bariatric surgery.Last LDL was at goal for her, however because she has DM,she is taking pravastatin daily .HDL was very good  Recheck labs yearly   HTN: she is on Norvasc for Raynaud's and Losartan for microalbuminuria,bp is towards low end of normal, but she does not have any dizziness or chest pain Unchanged   Insomnia: taking medication and is tolerating it well,she has been on medication for many years, she used to take high dose, now she is trying to make  it last and at times takes half . She has been going to bed later because has to help her youngest son with school work after she gets home from school We will add hydroxyzine for her to take on weekends when she takes half dose of Ambien and cannot fall back asleep after a few hours.   DMII with peripheral neuropathy and nephropathy: off metformin since surgery - bariatric surgery in 10/2014 , no polyphagia, but has episodes of polydipsia but no polyuria an, but has nocturia timesonceevery night.. On Gabapentin seems to help with neuropathy, taking one and a half 600 mg tablets at night only,She is back on Losartan , for 100 microalbuminuria. hgba1C 5.5%it was up to 6.2% July 2018 , she was started on Ozempic and hgbA1C has been normal since. She denies hypoglycemic episodes.   Major depression:chronic symptoms since her son was born. She has beentaking Fluoxetine, one daily, occasionally takes one extra pill during the day. No side effects. Denies crying spells, or anhedonia, just worries constantly about her son that has special needs .He is now in8th gradeon AIP classes, and with COVID-19 she is the one having to teach him, staying up late to be able to do her chores after helping him out . She is always tired, phq 9 negative but she states it is because she is used to it   Migraines: she has tried multiple medications, but could not tolerate, episodes about 1-2 times  months. Episodes are described as pain is behind her eyes, throbbing like, associated with nauseawhen she has a severe episode and photophobia. It can last up to 4 hours with medication ( Tylenol ) she is also takingGoody powdersintermittently but no longer taking ibuprofen.Reminded her again that NSAID's are very dangerous and can cause GI bleed in patients that had bariatric surgery. Usually triggered by stress. She has noticed some recent dull headaches from vision problems, she recently had cataract surgery on  left eye and is going back for right side next week, Dr. Wallace Going stated that is expected   Raynaud's: symptoms offingers on both hands gets white , painful and numb, not sure of the triggers at this time.  Worse this time of the year due to cold weather.   Vitiligo: she has noticed that is getting progressively worse   Patient Active Problem List   Diagnosis Date Noted  . Encounter for screening colonoscopy   . Polyp of sigmoid colon   . Controlled type 2 diabetes mellitus with neuropathy ([place]) 05/12/2016  . Raynaud's phenomenon 05/12/2016  . Intertrigo 09/06/2015  . Migraine headache without aura 02/28/2015  . History of obesity 02/28/2015  . Hyperlipidemia 02/28/2015  . GERD (gastroesophageal reflux disease) 02/28/2015  . Vitamin D deficiency 02/28/2015  . Chronic constipation 02/28/2015  . Chronic insomnia 02/28/2015  . Peripheral neuropathy 02/28/2015  . Vitiligo 02/28/2015  . Lower leg mass 02/28/2015  . Depression with anxiety 02/28/2015  . History of bariatric surgery 11/05/2014    Past Surgical History:  Procedure Laterality Date  . ABDOMINAL HYSTERECTOMY    . APPENDECTOMY    . BREAST SURGERY    . BREATH TEK H PYLORI N/A 08/27/2014   Procedure: BREATH TEK H PYLORI;  Surgeon: Pedro Earls, MD;  Location: [facility];  Service: General;  Laterality: N/A;  . CATARACT EXTRACTION W/PHACO Left 09/20/2019   Procedure: CATARACT EXTRACTION PHACO AND INTRAOCULAR LENS PLACEMENT (IOC) LEFT 0.74 00:12.3 6.0%;  Surgeon: Leandrew Koyanagi, MD;  Location: [facility];  Service: Ophthalmology;  Laterality: Left;  . COLONOSCOPY WITH PROPOFOL N/A 02/17/2019   Procedure: COLONOSCOPY WITH PROPOFOL;  Surgeon: Lucilla Lame, MD;  Location: [facility];  Service: Endoscopy;  Laterality: N/A;  . HIATAL HERNIA REPAIR  11/05/2014   Procedure: LAPAROSCOPIC REPAIR OF HIATAL HERNIA;  Surgeon: Johnathan Hausen, MD;  Location: [facility];  Service: General;;  . LAPAROSCOPIC GASTRIC  SLEEVE RESECTION N/A 11/05/2014   Procedure: LAPAROSCOPIC GASTRIC SLEEVE RESECTION;  Surgeon: Johnathan Hausen, MD;  Location: [facility];  Service: General;  Laterality: N/A;  . POLYPECTOMY  02/17/2019   Procedure: POLYPECTOMY;  Surgeon: Lucilla Lame, MD;  Location: [facility];  Service: Endoscopy;;  . REDUCTION MAMMAPLASTY Bilateral 1997  . UPPER GI ENDOSCOPY  11/05/2014   Procedure: UPPER GI ENDOSCOPY;  Surgeon: Johnathan Hausen, MD;  Location: [facility];  Service: General;;    Family History  Problem Relation Age of Onset  . Diabetes Paternal Uncle   . Stroke Father   . Hypertension Other   . Breast cancer Maternal Grandmother 72     Current Outpatient Medications:  .  amLODipine (NORVASC) 10 MG tablet, Take 1 tablet (10 mg total) by mouth daily., Disp: 90 tablet, Rfl: 1 .  Calcium Carb-Cholecalciferol (CALCIUM-VITAMIN D3) 500-400 MG-UNIT TABS, Take 1 tablet by mouth 3 (three) times daily., Disp: , Rfl:  .  cyanocobalamin 500 MCG tablet, Take 1 mcg by mouth every morning., Disp: , Rfl:  .  ferrous gluconate ([employer]  IRON) 240 (27 FE) MG tablet, Take 1 tablet (240 mg total) by mouth 3 (three) times daily with meals., Disp: 30 tablet, Rfl: 0 .  FLUoxetine (PROZAC) 10 MG capsule, Take 1 capsule (10 mg total) by mouth daily., Disp: 90 capsule, Rfl: 1 .  gabapentin (NEURONTIN) 600 MG tablet, Take 1.5 tablets (900 mg total) by mouth at bedtime., Disp: 135 tablet, Rfl: 1 .  losartan (COZAAR) 100 MG tablet, Take 1 tablet (100 mg total) by mouth daily., Disp: 90 tablet, Rfl: 1 .  nystatin (NYSTATIN) powder, Apply topically 4 (four) times daily., Disp: 60 g, Rfl: 2 .  omeprazole (PRILOSEC) 40 MG capsule, Take 1 capsule (40 mg total) by mouth daily., Disp: 90 capsule, Rfl: 0 .  ondansetron (ZOFRAN ODT) 4 MG disintegrating tablet, Take 1 tablet (4 mg total) by mouth every 8 (eight) hours as needed for nausea or vomiting., Disp: 20 tablet, Rfl: 0 .  pravastatin (PRAVACHOL) 20 MG tablet, Take 1 tablet (20  mg total) by mouth daily., Disp: 90 tablet, Rfl: 1 .  Semaglutide, 1 MG/DOSE, (OZEMPIC, 1 MG/DOSE,) 2 MG/1.5ML SOPN, Inject 1 mg into the skin once a week., Disp: 3 mL, Rfl: 5 .  triamcinolone cream (KENALOG) 0.1 %, Apply topically 2 (two) times daily., Disp: 453.9 g, Rfl: 0 .  zolpidem (AMBIEN) 10 MG tablet, Take 1 tablet (10 mg total) by mouth at bedtime., Disp: 90 tablet, Rfl: 0 .  hydrOXYzine (ATARAX/VISTARIL) 10 MG tablet, Take 1 tablet (10 mg total) by mouth at bedtime., Disp: 30 tablet, Rfl: 0 .  Multiple Vitamins-Minerals (MULTIVITAMIN ADULTS PO), Take 1 tablet by mouth daily., Disp: , Rfl:   No Known Allergies  I personally reviewed active problem list, medication list, allergies, family history, social history, health maintenance with the patient/caregiver today.   ROS  Constitutional: Negative for fever or weight change.  Respiratory: Negative for cough and shortness of breath.   Cardiovascular: Negative for chest pain or palpitations.  Gastrointestinal: Negative for abdominal pain, no bowel changes.  Musculoskeletal: Negative for gait problem or joint swelling.  Skin: Positive for rash.  Neurological: Negative for dizziness or headache.  No other specific complaints in a complete review of systems (except as listed in HPI above).  Objective  Vitals:   10/03/19 1323  BP: 110/78  Pulse: (!) 104  Resp: 16  Temp: (!) 96.9 F (36.1 C)  TempSrc: Temporal  SpO2: 97%  Weight: 150 lb 6.4 oz (68.2 kg)  Height: 5\' 2"  (1.575 m)    Body mass index is 27.51 kg/m.  Physical Exam  Constitutional: Patient appears well-developed and well-nourished.  No distress.  HEENT: head atraumatic, normocephalic, pupils equal and reactive to light Cardiovascular: Normal rate, regular rhythm and normal heart sounds.  No murmur heard. No BLE edema. Pulmonary/Chest: Effort normal and breath sounds normal. No respiratory distress. Abdominal: Soft.  There is no tenderness. Psychiatric:  Patient has a normal mood and affect. behavior is normal. Judgment and thought content normal. Skin: rash, eczematous on trunk, vitiligo hands, face   Recent Results (from the past 2160 hour(s))  SARS CORONAVIRUS 2 (TAT 6-24 HRS) Nasopharyngeal Nasopharyngeal Swab     Status: None   Collection Time: 09/18/19  1:19 PM   Specimen: Nasopharyngeal Swab  Result Value Ref Range   SARS Coronavirus 2 NEGATIVE NEGATIVE    Comment: (NOTE) SARS-CoV-2 target nucleic acids are NOT DETECTED. The SARS-CoV-2 RNA is generally detectable in upper and lower respiratory specimens during the acute phase of infection.  Negative results do not preclude SARS-CoV-2 infection, do not rule out co-infections with other pathogens, and should not be used as the sole basis for treatment or other patient management decisions. Negative results must be combined with clinical observations, patient history, and epidemiological information. The expected result is Negative. Fact Sheet for Patients: SugarRoll.be Fact Sheet for Healthcare Providers: https://www.woods-mathews.com/ This test is not yet approved or cleared by the [place] FDA and  has been authorized for detection and/or diagnosis of SARS-CoV-2 by FDA under an Emergency Use Authorization (EUA). This EUA will remain  in effect (meaning this test can be used) for the duration of the COVID-19 declaration under Section 56 4(b)(1) of the Act, 21 U.S.C. section 360bbb-3(b)(1), unless the authorization is terminated or revoked sooner. Performed at [facility], [place] [address] [place], [place] [address]   POCT HgB A1C     Status: Normal   Collection Time: 10/03/19  1:37 PM  Result Value Ref Range   Hemoglobin A1C 5.2 4.0 - 5.6 %   HbA1c POC (<> result, manual entry)     HbA1c, POC (prediabetic range)     HbA1c, POC (controlled diabetic range)        PHQ2/9: Depression screen [facility] 2/9 10/03/2019 05/30/2019  05/30/2019 01/24/2019 01/17/2019  Decreased Interest 0 0 0 0 0  Down, Depressed, Hopeless 0 0 0 0 0  PHQ - 2 Score 0 0 0 0 0  Altered sleeping 0 0 0 1 0  Tired, decreased energy 0 0 0 1 0  Change in appetite 0 0 0 0 0  Feeling bad or failure about yourself  0 0 0 0 0  Trouble concentrating 0 0 0 0 0  Moving slowly or fidgety/restless 0 0 0 0 0  Suicidal thoughts 0 0 0 0 0  PHQ-9 Score 0 0 0 2 0  Difficult doing work/chores - - - Not difficult at all Not difficult at all  Some recent data might be hidden    phq 9 is negative   Fall Risk: Fall Risk  10/03/2019 05/30/2019 05/30/2019 01/17/2019 09/13/2018  Falls in the past year? 0 0 0 0 0  Number falls in past yr: 0 0 0 0 0  Injury with Fall? 0 0 0 0 0     Assessment & Plan  1. Controlled type 2 diabetes mellitus with neuropathy (H[facility])  - POCT HgB A1C  2. Hypertension, benign  - amLODipine (NORVASC) 10 MG tablet; Take 1 tablet (10 mg total) by mouth daily.  Dispense: 90 tablet; Refill: 1 At goal   3. Raynaud's disease without gangrene  - amLODipine (NORVASC) 10 MG tablet; Take 1 tablet (10 mg total) by mouth daily.  Dispense: 90 tablet; Refill: 1  4. Peripheral polyneuropathy   5. Type 2 diabetes with nephropathy ([place])   6. Mild recurrent major depression ([place])  Taking Prozac   7. Migraine without aura and without status migrainosus, not intractable  - ondansetron (ZOFRAN ODT) 4 MG disintegrating tablet; Take 1 tablet (4 mg total) by mouth every 8 (eight) hours as needed for nausea or vomiting.  Dispense: 20 tablet; Refill: 0  8. Chronic insomnia  - zolpidem (AMBIEN) 10 MG tablet; Take 1 tablet (10 mg total) by mouth at bedtime.  Dispense: 90 tablet; Refill: 0 - hydrOXYzine (ATARAX/VISTARIL) 10 MG tablet; Take 1 tablet (10 mg total) by mouth at bedtime.  Dispense: 30 tablet; Refill: 0  9. B12 deficiency  Taking supplementation   10. History  of bariatric surgery  - Multiple Vitamins-Minerals (MULTIVITAMIN ADULTS  PO); Take 1 tablet by mouth daily.  11. Vitamin D deficiency  Taking supplementation   12. GERD without esophagitis  - omeprazole (PRILOSEC) 40 MG capsule; Take 1 capsule (40 mg total) by mouth daily.  Dispense: 90 capsule; Refill: 0  13. Other eczema  - triamcinolone cream (KENALOG) 0.1 %; Apply topically 2 (two) times daily.  Dispense: 453.9 g; Refill: 0

## 2019-10-03 NOTE — Addendum Note (Signed)
Addended by: Steele Sizer F on: 10/03/2019 02:01 PM   Modules accepted: Orders

## 2019-10-04 ENCOUNTER — Encounter: Payer: Self-pay | Admitting: Ophthalmology

## 2019-10-09 ENCOUNTER — Other Ambulatory Visit: Payer: Self-pay

## 2019-10-09 ENCOUNTER — Other Ambulatory Visit
Admission: RE | Admit: 2019-10-09 | Discharge: 2019-10-09 | Disposition: A | Discharge status: Home / Self Care | Payer: No Typology Code available for payment source | Source: Ambulatory Visit | Attending: Ophthalmology | Admitting: Ophthalmology

## 2019-10-09 DIAGNOSIS — Z20822 Contact with and (suspected) exposure to covid-19: Secondary | ICD-10-CM | POA: Diagnosis not present

## 2019-10-09 DIAGNOSIS — Z01812 Encounter for preprocedural laboratory examination: Secondary | ICD-10-CM | POA: Insufficient documentation

## 2019-10-10 LAB — SARS CORONAVIRUS 2 (TAT 6-24 HRS): SARS Coronavirus 2: NEGATIVE

## 2019-10-10 NOTE — Discharge Instructions (Signed)

## 2019-10-11 ENCOUNTER — Encounter
Admission: RE | Disposition: A | Discharge status: Home / Self Care | Payer: Self-pay | Source: Home / Self Care | Attending: Ophthalmology

## 2019-10-11 ENCOUNTER — Ambulatory Visit
Admission: RE | Admit: 2019-10-11 | Discharge: 2019-10-11 | Disposition: A | Discharge status: Home / Self Care | Payer: No Typology Code available for payment source | Attending: Ophthalmology | Admitting: Ophthalmology

## 2019-10-11 ENCOUNTER — Encounter: Payer: Self-pay | Admitting: Ophthalmology

## 2019-10-11 ENCOUNTER — Ambulatory Visit: Payer: No Typology Code available for payment source | Admitting: Anesthesiology

## 2019-10-11 ENCOUNTER — Other Ambulatory Visit: Payer: Self-pay

## 2019-10-11 DIAGNOSIS — K219 Gastro-esophageal reflux disease without esophagitis: Secondary | ICD-10-CM | POA: Insufficient documentation

## 2019-10-11 DIAGNOSIS — E119 Type 2 diabetes mellitus without complications: Secondary | ICD-10-CM | POA: Insufficient documentation

## 2019-10-11 DIAGNOSIS — F419 Anxiety disorder, unspecified: Secondary | ICD-10-CM | POA: Diagnosis not present

## 2019-10-11 DIAGNOSIS — Z79899 Other long term (current) drug therapy: Secondary | ICD-10-CM | POA: Diagnosis not present

## 2019-10-11 DIAGNOSIS — F329 Major depressive disorder, single episode, unspecified: Secondary | ICD-10-CM | POA: Diagnosis not present

## 2019-10-11 DIAGNOSIS — I10 Essential (primary) hypertension: Secondary | ICD-10-CM | POA: Insufficient documentation

## 2019-10-11 DIAGNOSIS — H25011 Cortical age-related cataract, right eye: Secondary | ICD-10-CM | POA: Insufficient documentation

## 2019-10-11 DIAGNOSIS — H269 Unspecified cataract: Secondary | ICD-10-CM | POA: Diagnosis present

## 2019-10-11 HISTORY — PX: CATARACT EXTRACTION W/PHACO: SHX586

## 2019-10-11 LAB — GLUCOSE, CAPILLARY
Glucose-Capillary: 114 mg/dL — ABNORMAL HIGH (ref 70–99)
Glucose-Capillary: 62 mg/dL — ABNORMAL LOW (ref 70–99)
Glucose-Capillary: 81 mg/dL (ref 70–99)

## 2019-10-11 SURGERY — PHACOEMULSIFICATION, CATARACT, WITH IOL INSERTION
Anesthesia: Monitor Anesthesia Care | Site: Eye | Laterality: Right

## 2019-10-11 MED ORDER — LIDOCAINE HCL (PF) 2 % IJ SOLN
INTRAOCULAR | Status: DC | PRN
  Administered 2019-10-11: 2 mL

## 2019-10-11 MED ORDER — ARMC OPHTHALMIC DILATING DROPS
1.0000 "application " | OPHTHALMIC | Status: DC | PRN
  Administered 2019-10-11 (×3): 1 via OPHTHALMIC

## 2019-10-11 MED ORDER — NA HYALUR & NA CHOND-NA HYALUR 0.4-0.35 ML IO KIT
PACK | INTRAOCULAR | Status: DC | PRN
  Administered 2019-10-11: 1 mL via INTRAOCULAR

## 2019-10-11 MED ORDER — BRIMONIDINE TARTRATE-TIMOLOL 0.2-0.5 % OP SOLN
OPHTHALMIC | Status: DC | PRN
  Administered 2019-10-11: 1 [drp] via OPHTHALMIC

## 2019-10-11 MED ORDER — TETRACAINE HCL 0.5 % OP SOLN
1.0000 [drp] | OPHTHALMIC | Status: DC | PRN
  Administered 2019-10-11 (×3): 1 [drp] via OPHTHALMIC

## 2019-10-11 MED ORDER — ACETAMINOPHEN 325 MG PO TABS: 325.0000 mg | ORAL_TABLET | ORAL | Status: DC | PRN

## 2019-10-11 MED ORDER — FENTANYL CITRATE (PF) 100 MCG/2ML IJ SOLN
INTRAMUSCULAR | Status: DC | PRN
  Administered 2019-10-11: 100 ug via INTRAVENOUS

## 2019-10-11 MED ORDER — MOXIFLOXACIN HCL 0.5 % OP SOLN
1.0000 [drp] | OPHTHALMIC | Status: DC | PRN
  Administered 2019-10-11 (×3): 1 [drp] via OPHTHALMIC

## 2019-10-11 MED ORDER — MIDAZOLAM HCL 2 MG/2ML IJ SOLN
INTRAMUSCULAR | Status: DC | PRN
  Administered 2019-10-11: 2 mg via INTRAVENOUS

## 2019-10-11 MED ORDER — DEXTROSE 50 % IV SOLN
25.0000 mL | Freq: Once | INTRAVENOUS | Status: AC
  Administered 2019-10-11: 25 mL via INTRAVENOUS

## 2019-10-11 MED ORDER — CEFUROXIME OPHTHALMIC INJECTION 1 MG/0.1 ML
INJECTION | OPHTHALMIC | Status: DC | PRN
  Administered 2019-10-11: 0.1 mL via INTRACAMERAL

## 2019-10-11 MED ORDER — ACETAMINOPHEN 160 MG/5ML PO SOLN: 325.0000 mg | ORAL | Status: DC | PRN

## 2019-10-11 MED ORDER — EPINEPHRINE PF 1 MG/ML IJ SOLN
INTRAOCULAR | Status: DC | PRN
  Administered 2019-10-11: 46 mL via OPHTHALMIC

## 2019-10-11 SURGICAL SUPPLY — 22 items
CANNULA ANT/CHMB 27G (MISCELLANEOUS) ×1 IMPLANT
CANNULA ANT/CHMB 27GA (MISCELLANEOUS) ×3 IMPLANT
GLOVE SURG LX 7.5 STRW (GLOVE) ×2
GLOVE SURG LX STRL 7.5 STRW (GLOVE) ×1 IMPLANT
GLOVE SURG TRIUMPH 8.0 PF LTX (GLOVE) ×3 IMPLANT
GOWN STRL REUS W/ TWL LRG LVL3 (GOWN DISPOSABLE) ×2 IMPLANT
GOWN STRL REUS W/TWL LRG LVL3 (GOWN DISPOSABLE) ×6
LENS IOL TECNIS ITEC 19.5 (Intraocular Lens) ×2 IMPLANT
MARKER SKIN DUAL TIP RULER LAB (MISCELLANEOUS) ×3 IMPLANT
NDL CAPSULORHEX 25GA (NEEDLE) ×1 IMPLANT
NDL FILTER BLUNT 18X1 1/2 (NEEDLE) ×2 IMPLANT
NEEDLE CAPSULORHEX 25GA (NEEDLE) ×3 IMPLANT
NEEDLE FILTER BLUNT 18X 1/2SAF (NEEDLE) ×4
NEEDLE FILTER BLUNT 18X1 1/2 (NEEDLE) ×2 IMPLANT
PACK CATARACT BRASINGTON (MISCELLANEOUS) ×3 IMPLANT
PACK EYE AFTER SURG (MISCELLANEOUS) ×3 IMPLANT
PACK OPTHALMIC (MISCELLANEOUS) ×3 IMPLANT
SOLUTION OPHTHALMIC SALT (MISCELLANEOUS) ×3 IMPLANT
SYR 3ML LL SCALE MARK (SYRINGE) ×6 IMPLANT
SYR TB 1ML LUER SLIP (SYRINGE) ×3 IMPLANT
WATER STERILE IRR 250ML POUR (IV SOLUTION) ×3 IMPLANT
WIPE NON LINTING 3.25X3.25 (MISCELLANEOUS) ×3 IMPLANT

## 2019-10-11 NOTE — Anesthesia Postprocedure Evaluation (Signed)
Anesthesia Post Note  Patient: Shannon Soto  Procedure(s) Performed: CATARACT EXTRACTION PHACO AND INTRAOCULAR LENS PLACEMENT (IOC)  RIGHT 1.55 00:24.1 6.4% (Right Eye)     Patient location during evaluation: PACU Anesthesia Type: MAC Level of consciousness: awake and alert Pain management: pain level controlled Vital Signs Assessment: post-procedure vital signs reviewed and stable Respiratory status: spontaneous breathing, nonlabored ventilation, respiratory function stable and patient connected to nasal cannula oxygen Cardiovascular status: stable and blood pressure returned to baseline Postop Assessment: no apparent nausea or vomiting Anesthetic complications: no    Wanda Plump Antoria Lanza

## 2019-10-11 NOTE — Op Note (Signed)
LOCATION:  [facility]   PREOPERATIVE DIAGNOSIS:    cortical cataract right eye. H25.011   POSTOPERATIVE DIAGNOSIS:  cortical cataract right eye.     PROCEDURE:  Phacoemusification with posterior chamber intraocular lens placement of the right eye   ULTRASOUND TIME: Procedure(s) with comments: CATARACT EXTRACTION PHACO AND INTRAOCULAR LENS PLACEMENT (IOC)  RIGHT 1.55 00:24.1 6.4% (Right) - diabetic  LENS:   Implant Name Type Inv. Item Serial No. Manufacturer Lot No. LRB No. Used Action  LENS IOL DIOP 19.5 - [id number] Intraocular Lens LENS IOL DIOP 19.5 [id number]  Right 1 Implanted         SURGEON:  Wyonia Hough, MD   ANESTHESIA:  Topical with tetracaine drops and 2% Xylocaine jelly, augmented with 1% preservative-free intracameral lidocaine.    COMPLICATIONS:  None.   DESCRIPTION OF PROCEDURE:  The patient was identified in the holding room and transported to the operating room and placed in the supine position under the operating microscope.  The right eye was identified as the operative eye and it was prepped and draped in the usual sterile ophthalmic fashion.   A 1 millimeter clear-corneal paracentesis was made at the 12:00 position.  0.5 ml of preservative-free 1% lidocaine was injected into the anterior chamber. The anterior chamber was filled with Viscoat viscoelastic.  A 2.4 millimeter keratome was used to make a near-clear corneal incision at the 9:00 position.  A curvilinear capsulorrhexis was made with a cystotome and capsulorrhexis forceps.  Balanced salt solution was used to hydrodissect and hydrodelineate the nucleus.   Phacoemulsification was then used in stop and chop fashion to remove the lens nucleus and epinucleus.  The remaining cortex was then removed using the irrigation and aspiration handpiece. Provisc was then placed into the capsular bag to distend it for lens placement.  A lens was then injected into the capsular bag.  The remaining  viscoelastic was aspirated.   Wounds were hydrated with balanced salt solution.  The anterior chamber was inflated to a physiologic pressure with balanced salt solution.  No wound leaks were noted. Cefuroxime 0.1 ml of a 10mg /ml solution was injected into the anterior chamber for a dose of 1 mg of intracameral antibiotic at the completion of the case.   Timolol and Brimonidine drops were applied to the eye.  The patient was taken to the recovery room in stable condition without complications of anesthesia or surgery.   Phoebe Marter 10/11/2019, 9:04 AM

## 2019-10-11 NOTE — Anesthesia Preprocedure Evaluation (Signed)
Anesthesia Evaluation  Patient identified by MRN, date of birth, ID band Patient awake    Reviewed: Allergy & Precautions, NPO status , Patient's Chart, lab work & pertinent test results  Airway Mallampati: II  TM Distance: >3 FB Neck ROM: Limited    Dental   Pulmonary    Pulmonary exam normal        Cardiovascular hypertension, Normal cardiovascular exam     Neuro/Psych  Headaches, PSYCHIATRIC DISORDERS Anxiety Depression    GI/Hepatic GERD  ,  Endo/Other  diabetes  Renal/GU      Musculoskeletal   Abdominal   Peds  Hematology   Anesthesia Other Findings   Reproductive/Obstetrics                             Anesthesia Physical Anesthesia Plan  ASA: II  Anesthesia Plan: MAC   Post-op Pain Management:    Induction: Intravenous  PONV Risk Score and Plan: TIVA and Midazolam  Airway Management Planned: Natural Airway and Nasal Cannula  Additional Equipment:   Intra-op Plan:   Post-operative Plan:   Informed Consent: I have reviewed the patients History and Physical, chart, labs and discussed the procedure including the risks, benefits and alternatives for the proposed anesthesia with the patient or authorized representative who has indicated his/her understanding and acceptance.       Plan Discussed with: CRNA, Anesthesiologist and Surgeon  Anesthesia Plan Comments:         Anesthesia Quick Evaluation

## 2019-10-11 NOTE — Transfer of Care (Signed)
Immediate Anesthesia Transfer of Care Note  Patient: Shannon Soto  Procedure(s) Performed: CATARACT EXTRACTION PHACO AND INTRAOCULAR LENS PLACEMENT (IOC)  RIGHT 1.55 00:24.1 6.4% (Right Eye)  Patient Location: PACU  Anesthesia Type: MAC  Level of Consciousness: awake, alert  and patient cooperative  Airway and Oxygen Therapy: Patient Spontanous Breathing and Patient connected to supplemental oxygen  Post-op Assessment: Post-op Vital signs reviewed, Patient's Cardiovascular Status Stable, Respiratory Function Stable, Patent Airway and No signs of Nausea or vomiting  Post-op Vital Signs: Reviewed and stable  Complications: No apparent anesthesia complications

## 2019-10-11 NOTE — Anesthesia Procedure Notes (Signed)
Procedure Name: MAC Performed by: Williom Cedar, CRNA Pre-anesthesia Checklist: Patient identified, Emergency Drugs available, Suction available, Timeout performed and Patient being monitored Patient Re-evaluated:Patient Re-evaluated prior to induction Oxygen Delivery Method: Nasal cannula Placement Confirmation: positive ETCO2       

## 2019-10-11 NOTE — H&P (Signed)

## 2019-10-12 ENCOUNTER — Encounter: Payer: Self-pay | Admitting: *Deleted

## 2019-10-24 ENCOUNTER — Ambulatory Visit: Payer: No Typology Code available for payment source | Attending: Internal Medicine

## 2019-10-24 DIAGNOSIS — Z23 Encounter for immunization: Secondary | ICD-10-CM | POA: Insufficient documentation

## 2019-10-24 NOTE — Progress Notes (Signed)
   Covid-19 Vaccination Clinic  Name:  Shannon Soto    MRN: [id number] DOB: 09-20-1970  10/24/2019  [name] was observed post Covid-19 immunization for 15 minutes without incident. She was provided with Vaccine Information Sheet and instruction to access the V-Safe system.   [name] was instructed to call 911 with any severe reactions post vaccine: Marland Kitchen Difficulty breathing  . Swelling of face and throat  . A fast heartbeat  . A bad rash all over body  . Dizziness and weakness   Immunizations Administered    Name Date Dose VIS Date Route   Pfizer COVID-19 Vaccine 10/24/2019  8:10 AM 0.3 mL 08/04/2019 Intramuscular   Manufacturer: [place]   Lot: [id number]   [place]: [id number]

## 2019-12-04 ENCOUNTER — Encounter: Payer: Self-pay | Admitting: Family Medicine

## 2019-12-04 ENCOUNTER — Other Ambulatory Visit: Payer: Self-pay

## 2019-12-04 DIAGNOSIS — K219 Gastro-esophageal reflux disease without esophagitis: Secondary | ICD-10-CM

## 2019-12-04 MED ORDER — OMEPRAZOLE 40 MG PO CPDR: 40.0000 mg | DELAYED_RELEASE_CAPSULE | Freq: Every day | ORAL | 0 refills | Status: DC

## 2020-02-05 ENCOUNTER — Encounter: Payer: Self-pay | Admitting: Family Medicine

## 2020-02-05 ENCOUNTER — Other Ambulatory Visit: Payer: Self-pay | Admitting: Family Medicine

## 2020-02-05 DIAGNOSIS — I7300 Raynaud's syndrome without gangrene: Secondary | ICD-10-CM

## 2020-02-05 DIAGNOSIS — I10 Essential (primary) hypertension: Secondary | ICD-10-CM

## 2020-02-05 DIAGNOSIS — F330 Major depressive disorder, recurrent, mild: Secondary | ICD-10-CM

## 2020-02-05 DIAGNOSIS — E1121 Type 2 diabetes mellitus with diabetic nephropathy: Secondary | ICD-10-CM

## 2020-02-05 DIAGNOSIS — E7800 Pure hypercholesterolemia, unspecified: Secondary | ICD-10-CM

## 2020-02-05 MED ORDER — FLUOXETINE HCL 10 MG PO CAPS: 10.0000 mg | ORAL_CAPSULE | Freq: Every day | ORAL | 1 refills | Status: DC

## 2020-02-05 MED ORDER — LOSARTAN POTASSIUM 100 MG PO TABS: 100.0000 mg | ORAL_TABLET | Freq: Every day | ORAL | 1 refills | Status: DC

## 2020-02-05 MED ORDER — PRAVASTATIN SODIUM 20 MG PO TABS: 20.0000 mg | ORAL_TABLET | Freq: Every day | ORAL | 1 refills | Status: DC

## 2020-02-06 ENCOUNTER — Encounter: Payer: No Typology Code available for payment source | Admitting: Family Medicine

## 2020-02-08 ENCOUNTER — Encounter: Payer: Self-pay | Admitting: Family Medicine

## 2020-02-08 ENCOUNTER — Ambulatory Visit (INDEPENDENT_AMBULATORY_CARE_PROVIDER_SITE_OTHER): Payer: No Typology Code available for payment source | Admitting: Family Medicine

## 2020-02-08 ENCOUNTER — Other Ambulatory Visit: Payer: Self-pay | Admitting: Family Medicine

## 2020-02-08 ENCOUNTER — Other Ambulatory Visit: Payer: Self-pay

## 2020-02-08 VITALS — BP 120/86 | HR 92 | Temp 98.1°F | Resp 14 | Ht 62.0 in | Wt 137.2 lb

## 2020-02-08 DIAGNOSIS — E538 Deficiency of other specified B group vitamins: Secondary | ICD-10-CM

## 2020-02-08 DIAGNOSIS — Z23 Encounter for immunization: Secondary | ICD-10-CM | POA: Diagnosis not present

## 2020-02-08 DIAGNOSIS — Z0000 Encounter for general adult medical examination without abnormal findings: Secondary | ICD-10-CM

## 2020-02-08 DIAGNOSIS — E7800 Pure hypercholesterolemia, unspecified: Secondary | ICD-10-CM

## 2020-02-08 DIAGNOSIS — I10 Essential (primary) hypertension: Secondary | ICD-10-CM | POA: Diagnosis not present

## 2020-02-08 DIAGNOSIS — F5104 Psychophysiologic insomnia: Secondary | ICD-10-CM

## 2020-02-08 DIAGNOSIS — G43009 Migraine without aura, not intractable, without status migrainosus: Secondary | ICD-10-CM

## 2020-02-08 DIAGNOSIS — Z1159 Encounter for screening for other viral diseases: Secondary | ICD-10-CM

## 2020-02-08 DIAGNOSIS — F320 Major depressive disorder, single episode, mild: Secondary | ICD-10-CM

## 2020-02-08 DIAGNOSIS — Z9884 Bariatric surgery status: Secondary | ICD-10-CM

## 2020-02-08 DIAGNOSIS — K219 Gastro-esophageal reflux disease without esophagitis: Secondary | ICD-10-CM

## 2020-02-08 DIAGNOSIS — E559 Vitamin D deficiency, unspecified: Secondary | ICD-10-CM

## 2020-02-08 DIAGNOSIS — G629 Polyneuropathy, unspecified: Secondary | ICD-10-CM

## 2020-02-08 DIAGNOSIS — I7300 Raynaud's syndrome without gangrene: Secondary | ICD-10-CM

## 2020-02-08 DIAGNOSIS — E1121 Type 2 diabetes mellitus with diabetic nephropathy: Secondary | ICD-10-CM

## 2020-02-08 DIAGNOSIS — L80 Vitiligo: Secondary | ICD-10-CM

## 2020-02-08 DIAGNOSIS — E1140 Type 2 diabetes mellitus with diabetic neuropathy, unspecified: Secondary | ICD-10-CM

## 2020-02-08 LAB — POCT GLYCOSYLATED HEMOGLOBIN (HGB A1C): Hemoglobin A1C: 5.6 % (ref 4.0–5.6)

## 2020-02-08 MED ORDER — GABAPENTIN 600 MG PO TABS: 900.0000 mg | ORAL_TABLET | Freq: Every day | ORAL | 1 refills | Status: DC

## 2020-02-08 MED ORDER — OZEMPIC (1 MG/DOSE) 2 MG/1.5ML ~~LOC~~ SOPN: 1.0000 mg | PEN_INJECTOR | SUBCUTANEOUS | 5 refills | Status: DC

## 2020-02-08 MED ORDER — HYDROXYZINE HCL 10 MG PO TABS: 10.0000 mg | ORAL_TABLET | Freq: Three times a day (TID) | ORAL | 0 refills | Status: DC | PRN

## 2020-02-08 MED ORDER — ZOLPIDEM TARTRATE 10 MG PO TABS: 10.0000 mg | ORAL_TABLET | Freq: Every day | ORAL | 0 refills | Status: DC

## 2020-02-08 MED ORDER — NURTEC 75 MG PO TBDP: 1.0000 | ORAL_TABLET | Freq: Every day | ORAL | 1 refills | Status: DC | PRN

## 2020-02-08 MED ORDER — OMEPRAZOLE 20 MG PO CPDR: 20.0000 mg | DELAYED_RELEASE_CAPSULE | Freq: Two times a day (BID) | ORAL | 1 refills | Status: DC

## 2020-02-08 MED ORDER — ONDANSETRON 4 MG PO TBDP: 4.0000 mg | ORAL_TABLET | Freq: Three times a day (TID) | ORAL | 0 refills | Status: DC | PRN

## 2020-02-08 NOTE — Patient Instructions (Signed)
Ask insurance about [employer] coverage   Preventive Care 51-51 Years Old, Female Preventive care refers to visits with your health care provider and lifestyle choices that can promote health and wellness. This includes:  A yearly physical exam. This may also be called an annual well check.  Regular dental visits and eye exams.  Immunizations.  Screening for certain conditions.  Healthy lifestyle choices, such as eating a healthy diet, getting regular exercise, not using drugs or products that contain nicotine and tobacco, and limiting alcohol use. What can I expect for my preventive care visit? Physical exam Your health care provider will check your:  Height and weight. This may be used to calculate body mass index (BMI), which tells if you are at a healthy weight.  Heart rate and blood pressure.  Skin for abnormal spots. Counseling Your health care provider may ask you questions about your:  Alcohol, tobacco, and drug use.  Emotional well-being.  Home and relationship well-being.  Sexual activity.  Eating habits.  Work and work Statistician.  Method of birth control.  Menstrual cycle.  Pregnancy history. What immunizations do I need?  Influenza (flu) vaccine  This is recommended every year. Tetanus, diphtheria, and pertussis (Tdap) vaccine  You may need a Td booster every 10 years. Varicella (chickenpox) vaccine  You may need this if you have not been vaccinated. Zoster (shingles) vaccine  You may need this after age 22. Measles, mumps, and rubella (MMR) vaccine  You may need at least one dose of MMR if you were born in 1957 or later. You may also need a second dose. Pneumococcal conjugate (PCV13) vaccine  You may need this if you have certain conditions and were not previously vaccinated. Pneumococcal polysaccharide (PPSV23) vaccine  You may need one or two doses if you smoke cigarettes or if you have certain conditions. Meningococcal conjugate  (MenACWY) vaccine  You may need this if you have certain conditions. Hepatitis A vaccine  You may need this if you have certain conditions or if you travel or work in places where you may be exposed to hepatitis A. Hepatitis B vaccine  You may need this if you have certain conditions or if you travel or work in places where you may be exposed to hepatitis B. Haemophilus influenzae type b (Hib) vaccine  You may need this if you have certain conditions. Human papillomavirus (HPV) vaccine  If recommended by your health care provider, you may need three doses over 6 months. You may receive vaccines as individual doses or as more than one vaccine together in one shot (combination vaccines). Talk with your health care provider about the risks and benefits of combination vaccines. What tests do I need? Blood tests  Lipid and cholesterol levels. These may be checked every 5 years, or more frequently if you are over 67 years old.  Hepatitis C test.  Hepatitis B test. Screening  Lung cancer screening. You may have this screening every year starting at age 16 if you have a 30-pack-year history of smoking and currently smoke or have quit within the past 15 years.  Colorectal cancer screening. All adults should have this screening starting at age 85 and continuing until age 29. Your health care provider may recommend screening at age 48 if you are at increased risk. You will have tests every 1-10 years, depending on your results and the type of screening test.  Diabetes screening. This is done by checking your blood sugar (glucose) after you have not eaten for  a while (fasting). You may have this done every 1-3 years.  Mammogram. This may be done every 1-2 years. Talk with your health care provider about when you should start having regular mammograms. This may depend on whether you have a family history of breast cancer.  BRCA-related cancer screening. This may be done if you have a family  history of breast, ovarian, tubal, or peritoneal cancers.  Pelvic exam and Pap test. This may be done every 3 years starting at age 12. Starting at age 53, this may be done every 5 years if you have a Pap test in combination with an HPV test. Other tests  Sexually transmitted disease (STD) testing.  Bone density scan. This is done to screen for osteoporosis. You may have this scan if you are at high risk for osteoporosis. Follow these instructions at home: Eating and drinking  Eat a diet that includes fresh fruits and vegetables, whole grains, lean protein, and low-fat dairy.  Take vitamin and mineral supplements as recommended by your health care provider.  Do not drink alcohol if: ? Your health care provider tells you not to drink. ? You are pregnant, may be pregnant, or are planning to become pregnant.  If you drink alcohol: ? Limit how much you have to 0-1 drink a day. ? Be aware of how much alcohol is in your drink. In the [place] one drink equals one 12 oz bottle of beer (355 mL), one 5 oz glass of wine (148 mL), or one 1 oz glass of hard liquor (44 mL). Lifestyle  Take daily care of your teeth and gums.  Stay active. Exercise for at least 30 minutes on 5 or more days each week.  Do not use any products that contain nicotine or tobacco, such as cigarettes, e-cigarettes, and chewing tobacco. If you need help quitting, ask your health care provider.  If you are sexually active, practice safe sex. Use a condom or other form of birth control (contraception) in order to prevent pregnancy and STIs (sexually transmitted infections).  If told by your health care provider, take low-dose aspirin daily starting at age 69. What's next?  Visit your health care provider once a year for a well check visit.  Ask your health care provider how often you should have your eyes and teeth checked.  Stay up to date on all vaccines. This information is not intended to replace advice given to you  by your health care provider. Make sure you discuss any questions you have with your health care provider. Document Revised: 04/21/2018 Document Reviewed: 04/21/2018 [employer]vier Patient Education  2020 [employer].

## 2020-02-08 NOTE — Progress Notes (Signed)
Name: Shannon Soto   MRN: [id number]    DOB: 1971/06/23   Date:02/08/2020       Progress Note  Subjective  Chief Complaint  Chief Complaint  Patient presents with   Annual Exam   Diabetes   Follow-up    HPI  Patient presents for annual CPE and follow up  Bariatric Surgery: she had sleeve surgery done by Dr. Hassell Done on March 14th, 2016, she has achieved her goal weight of145 lbs,today weight isup to 150 lbs, she states she is very stressed, skipping meals and not as active since COVI-19Weight prior to surgery was 242 lbsShe is doing well,hgbA1C started to go up, and we started her on Ozempic 07/2018weightwas in the 140 range until Fall 2020 when weight went up to 150 lbs on her last visit, since Feb she lost down to 137 lbs again by walking more to keep weight down and is happy with results. She has recurrent rash under breast( but that part is betterwith topical medication)and abdominal folds from the significant weight loss and has to use Nystatin prn and medication helps,she states still has burning sensation, medication seems to help but sometimes it has flares with redness and irritation.She is considering tummy tuck, but wait until next year since she had a lot of medical expenses today   Eczema: she has recurrent symptoms during winter months when skin is dry, triamcinolone helps and she needs a refill  Vitiligo: getting more prominent , spreading and the ones she has are getting larger, but she does not want to go to dermatologist at this time  GERD: she has a long history of reflux, symptoms were controlled with 20 mg, however over the past 4 months she has been on 40 mg to control symptoms, no heartburn or indigestion at this time.   Hyperlipidemia: , however because she has DM,she is taking pravastatin daily .HDL was very good Recheck labs today   HTN: she is on Norvasc for Raynaud's and Losartan for microalbuminuria,bp isat goal today. No chest pain  or  dizziness. She has intermittent palpitation lasts a few seconds , not associated with nausea or diaphoresis   Insomnia: she takes Ambien, she has been on medication for many years, she used to take high dose, now she is trying to make it last and at times takes half. She is going to bed around 11 pm We will add hydroxyzine for her to take on weekends when she takes half dose of Ambien and cannot fall back asleep after a few hours. The current regiment seems to help   DMII with peripheral neuropathy and nephropathy: off metformin since bariatric surgery in 10/2014 , no polyphagia, but has episodes of polydipsia but no polyuria  but has nocturia timesonceevery night.. On Gabapentin seems to help with neuropathy, taking one and a half 600 mg tablets at night only,She is back on Losartan , because last urine micro was  100. Today A1C is 5.6 % , she has been tolerating Ozempic, no side effects   Major depression:chronic symptoms since her son was born. She has beentaking Fluoxetine, one daily, occasionally takes one extra pill during the day, discussed taking hydroxizine prn for anxiety . No side effects. 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 Unchanged   Migraines: she has tried multiple medications, but could not tolerate, episodes about1-2times months. Episodes are described as pain is behind her eyes, throbbing like, associated with nauseawhen she has a severe episode and photophobia. It can last up to 4 hours with medication ( Tylenol ) she is also takingGoody powdersintermittently but no longer taking ibuprofen.Reminded her again that NSAID's are very dangerous and can cause  GI bleed in patients that had bariatric surgery. Usually triggered by stress. Unchanged . Discussed Roselyn Meier but she is afraid of trying   Raynaud's: symptoms offingers on both hands gets white , painful and numb, not sure of the triggers at this time.  Worse this time of the year due to cold weather. Unchanged    Diet: she eats a balanced diet  Exercise:   USPSTF grade A and B recommendations    Office Visit from 02/08/2020 in [facility]  AUDIT-C Score 0     Depression: Phq 9 is  negative Depression screen [facility] 2/9 02/08/2020 10/03/2019 05/30/2019 05/30/2019 01/24/2019  Decreased Interest 0 0 0 0 0  Down, Depressed, Hopeless 0 0 0 0 0  PHQ - 2 Score 0 0 0 0 0  Altered sleeping 3 0 0 0 1  Tired, decreased energy 0 0 0 0 1  Change in appetite 0 0 0 0 0  Feeling bad or failure about yourself  0 0 0 0 0  Trouble concentrating 0 0 0 0 0  Moving slowly or fidgety/restless 0 0 0 0 0  Suicidal thoughts 0 0 0 0 0  PHQ-9 Score 3 0 0 0 2  Difficult doing work/chores Not difficult at all - - - Not difficult at all  Some recent data might be hidden   Hypertension: BP Readings from Last 3 Encounters:  02/08/20 120/86  10/11/19 113/82  10/03/19 110/78   Obesity: Wt Readings from Last 3 Encounters:  02/08/20 137 lb 3.2 oz (62.2 kg)  10/11/19 149 lb 6.4 oz (67.8 kg)  10/03/19 150 lb 6.4 oz (68.2 kg)   BMI Readings from Last 3 Encounters:  02/08/20 25.09 kg/m  10/11/19 26.47 kg/m  10/03/19 27.51 kg/m     Hep C Screening: today  STD testing and prevention (HIV/chl/gon/syphilis): not interested  Intimate partner violence: negative  Sexual History (Partners/Practices/Protection from [employer] hx STI/Pregnancy Plans): she had a hysterectomy  Pain during Intercourse: no pain  Menstrual History/LMP/Abnormal Bleeding: s/p hysterectomy  Incontinence Symptoms: none   Breast cancer:  - Last Mammogram: repeat 04/2020  - BRCA gene screening: N/A  Osteoporosis: Discussed  high calcium and vitamin D supplementation, weight bearing exercises  Cervical cancer screening: not indicated   Skin cancer: Discussed monitoring for atypical lesions  Colorectal cancer: repeat in 2030  Lung cancer:   Low Dose CT Chest recommended if Age 40-80 years, 30 pack-year currently smoking OR have quit w/in 15years. Patient does not qualify.   ECG: 2016   Advanced Care Planning: A voluntary discussion about advance care planning including the explanation and discussion of advance directives.  Discussed health care proxy and Living will, and the patient was able to identify a health care proxy as oldest son.  Patient does not have a living will at present time. If patient does have living will, I have requested they bring this to the clinic to be scanned in to their chart.  Lipids: Lab Results  Component Value Date   CHOL 147 01/24/2019   CHOL 179 02/08/2018   CHOL 175 12/08/2016   Lab Results  Component Value Date   HDL 55 01/24/2019   HDL 47 02/08/2018   HDL 45 12/08/2016   Lab Results  Component Value Date   LDLCALC 79 01/24/2019   LDLCALC 122 (H) 02/08/2018   LDLCALC 121 (H) 12/08/2016   Lab Results  Component Value Date   TRIG 47 01/24/2019   TRIG 49 02/08/2018   TRIG 43 12/08/2016   Lab Results  Component Value Date   CHOLHDL 2.7 01/24/2019   CHOLHDL 3.8 02/08/2018   CHOLHDL 3.9 12/08/2016   No results found for: LDLDIRECT  Glucose: Glucose  Date Value Ref Range Status  05/22/2014 114 (H) 65 - 99 mg/dL Final  06/14/2013 84 65 - 99 mg/dL Final  05/27/2012 81 65 - 99 mg/dL Final   Glucose, Bld  Date Value Ref Range Status  01/24/2019 84 65 - 99 mg/dL Final    Comment:    .            Fasting reference interval .   02/08/2018 80 65 - 99 mg/dL Final  12/08/2016 103 (H) 65 - 99 mg/dL Final   Glucose-Capillary  Date Value Ref Range Status  10/11/2019 81 70 - 99 mg/dL Final  10/11/2019 114 (H) 70 - 99 mg/dL Final  10/11/2019 62 (L) 70 - 99  mg/dL Final    Patient Active Problem List   Diagnosis Date Noted   Encounter for screening colonoscopy    Polyp of sigmoid colon    Controlled type 2 diabetes mellitus with neuropathy ([place]) 05/12/2016   Raynaud's phenomenon 05/12/2016   Intertrigo 09/06/2015   Migraine headache without aura 02/28/2015   History of obesity 02/28/2015   Hyperlipidemia 02/28/2015   GERD (gastroesophageal reflux disease) 02/28/2015   Vitamin D deficiency 02/28/2015   Chronic constipation 02/28/2015   Chronic insomnia 02/28/2015   Peripheral neuropathy 02/28/2015   Vitiligo 02/28/2015   Lower leg mass 02/28/2015   Depression with anxiety 02/28/2015   History of bariatric surgery 11/05/2014    Past Surgical History:  Procedure Laterality Date   ABDOMINAL HYSTERECTOMY     APPENDECTOMY     BREAST SURGERY     BREATH TEK H PYLORI N/A 08/27/2014   Procedure: BREATH TEK H PYLORI;  Surgeon: Pedro Earls, MD;  Location: [facility];  Service: General;  Laterality: N/A;   CATARACT EXTRACTION W/PHACO Left 09/20/2019   Procedure: CATARACT EXTRACTION PHACO AND INTRAOCULAR LENS PLACEMENT (IOC) LEFT 0.74 00:12.3 6.0%;  Surgeon: Leandrew Koyanagi, MD;  Location: [place];  Service: Ophthalmology;  Laterality: Left;   CATARACT EXTRACTION W/PHACO Right 10/11/2019   Procedure: CATARACT EXTRACTION PHACO AND INTRAOCULAR LENS PLACEMENT (IOC)  RIGHT 1.55 00:24.1 6.4%;  Surgeon: Leandrew Koyanagi, MD;  Location: [place];  Service: Ophthalmology;  Laterality: Right;  diabetic   COLONOSCOPY WITH PROPOFOL N/A 02/17/2019   Procedure: COLONOSCOPY WITH PROPOFOL;  Surgeon: Lucilla Lame, MD;  Location: [place];  Service: Endoscopy;  Laterality: N/A;   HIATAL HERNIA REPAIR  11/05/2014   Procedure: LAPAROSCOPIC REPAIR OF HIATAL HERNIA;  Surgeon: Johnathan Hausen, MD;  Location: [facility];  Service: General;;   LAPAROSCOPIC GASTRIC SLEEVE RESECTION N/A 11/05/2014    Procedure: LAPAROSCOPIC GASTRIC SLEEVE RESECTION;  Surgeon: Johnathan Hausen, MD;  Location: [facility];  Service: General;  Laterality: N/A;   POLYPECTOMY  02/17/2019   Procedure: POLYPECTOMY;  Surgeon: Lucilla Lame, MD;  Location: [place] [facility];  Service: Endoscopy;;   REDUCTION MAMMAPLASTY Bilateral 1997   UPPER GI ENDOSCOPY  11/05/2014   Procedure: UPPER GI ENDOSCOPY;  Surgeon: Johnathan Hausen, MD;  Location: [facility];  Service: General;;    Family History  Problem Relation Age of Onset   Diabetes Paternal Uncle    Stroke Father    Hypertension Other    Breast cancer Maternal Grandmother 52    Social History   Socioeconomic History   Marital status: Single    Spouse name: Not on file   Number of children: 3   Years of education: Not on file   Highest education level: Associate degree: occupational, Hotel manager, or vocational program  Occupational History   Not on file  Tobacco Use   Smoking status: Never Smoker   Smokeless tobacco: Never Used  Scientific laboratory technician Use: Never used  Substance and Sexual Activity   Alcohol use: No    Alcohol/week: 0.0 standard drinks    Comment: occasional    Drug use: No   Sexual activity: Yes  Other Topics Concern   Not on file  Social History Narrative   Not on file   Social Determinants of Health   Financial Resource Strain: Low Risk    Difficulty of Paying Living Expenses: Not hard at all  Food Insecurity: No Food Insecurity   Worried About Charity fundraiser in the Last Year: Never true   [place] in the Last Year: Never true  Transportation Needs: No Transportation Needs   Lack of Transportation (Medical): No   Lack of Transportation (Non-Medical): No  Physical Activity: Insufficiently Active   Days of Exercise per Week: 2 days   Minutes of Exercise per Session: 30 min  Stress: No Stress Concern Present   Feeling of Stress : Only a little  Social Connections: Psychologist, educational of Communication with Friends and Family: More than three times a week   Frequency of Social Gatherings with Friends and Family: More than three times a week   Attends Religious Services: More than 4 times per year   Active Member of [employer] or Organizations: Yes   Attends Archivist Meetings: Never   Marital Status: Married  Human resources officer Violence: Not At Risk   Fear of Current or Ex-Partner: No   Emotionally Abused: No   Physically Abused: No   Sexually Abused: No     Current Outpatient Medications:    amLODipine (NORVASC) 10 MG tablet, Take 1 tablet (10 mg total) by mouth daily., Disp: 90 tablet, Rfl: 1   Calcium Carb-Cholecalciferol (CALCIUM-VITAMIN D3) 500-400 MG-UNIT TABS, Take 1 tablet by mouth 3 (three) times daily., Disp: , Rfl:    cyanocobalamin 500 MCG tablet, Take 1 mcg by mouth every morning., Disp: , Rfl:    ferrous gluconate ([employer] IRON) 240 (27 FE) MG tablet, Take 1 tablet (240 mg total) by mouth 3 (three) times daily with meals., Disp: 30 tablet, Rfl: 0   FLUoxetine (PROZAC) 10 MG capsule, Take 1 capsule (10 mg total) by mouth daily., Disp: 90 capsule, Rfl: 1   gabapentin (NEURONTIN) 600 MG tablet, Take 1.5 tablets (900 mg total) by mouth at bedtime., Disp: 135 tablet, Rfl: 1   hydrOXYzine (ATARAX/VISTARIL) 10 MG tablet, Take 1 tablet (10 mg total) by mouth every 8 (eight) hours as needed., Disp: 90 tablet, Rfl: 0   losartan (COZAAR) 100 MG tablet, Take 1 tablet (100 mg total) by mouth  daily., Disp: 90 tablet, Rfl: 1   Multiple Vitamins-Minerals (MULTIVITAMIN ADULTS PO), Take 1 tablet by mouth daily., Disp: , Rfl:    nystatin (NYSTATIN) powder, Apply topically 4 (four) times daily., Disp: 60 g, Rfl: 2   omeprazole (PRILOSEC) 20 MG capsule, Take 1 capsule (20 mg total) by mouth 2 (two) times daily., Disp: 180 capsule, Rfl: 1   ondansetron (ZOFRAN ODT) 4 MG disintegrating tablet, Take 1 tablet (4 mg total) by mouth every 8 (eight) hours  as needed for nausea or vomiting., Disp: 20 tablet, Rfl: 0   pravastatin (PRAVACHOL) 20 MG tablet, Take 1 tablet (20 mg total) by mouth daily., Disp: 90 tablet, Rfl: 1   Semaglutide, 1 MG/DOSE, (OZEMPIC, 1 MG/DOSE,) 2 MG/1.5ML SOPN, Inject 0.75 mLs (1 mg total) into the skin once a week., Disp: 3 mL, Rfl: 5   triamcinolone cream (KENALOG) 0.1 %, Apply topically 2 (two) times daily., Disp: 453.9 g, Rfl: 0   zolpidem (AMBIEN) 10 MG tablet, Take 1 tablet (10 mg total) by mouth at bedtime., Disp: 90 tablet, Rfl: 0   Rimegepant Sulfate (NURTEC) 75 MG TBDP, Take 1 tablet by mouth daily as needed., Disp: 8 tablet, Rfl: 1  No Known Allergies   ROS  Constitutional: Negative for fever , positive for weight change.  Respiratory: Negative for cough and shortness of breath.   Cardiovascular: Negative for chest pain , positive for  palpitations.  Gastrointestinal: Negative for abdominal pain, no bowel changes.  Musculoskeletal: Negative for gait problem or joint swelling.  Skin: Negative for rash.  Neurological: Negative for dizziness , positive for intermittent headache.  No other specific complaints in a complete review of systems (except as listed in HPI above).  Objective  Vitals:   02/08/20 1318  BP: 120/86  Pulse: 92  Resp: 14  Temp: 98.1 F (36.7 C)  TempSrc: Temporal  SpO2: 99%  Weight: 137 lb 3.2 oz (62.2 kg)  Height: _0  (1.575 m)    Body mass index is 25.09 kg/m.  Physical Exam  Constitutional: Patient appears well-developed and well-nourished. No distress.  HENT: Head: Normocephalic and atraumatic. Ears: B TMs ok, no erythema or effusion; Nose: Nose normal. Mouth/Throat: Oropharynx is clear and moist. No oropharyngeal exudate.  Eyes: Conjunctivae and EOM are normal. Pupils are equal, round, and reactive to light. No scleral icterus.  Neck: Normal range of motion. Neck supple. No JVD present. No thyromegaly present.  Cardiovascular: Normal rate, regular rhythm and  normal heart sounds.  No murmur heard. No BLE edema. Pulmonary/Chest: Effort normal and breath sounds normal. No respiratory distress. Abdominal: Soft. Bowel sounds are normal, no distension. There is no tenderness. no masses Breast: no lumps or masses, no nipple discharge or rashes FEMALE GENITALIA:  Not done  RECTAL: not done  Musculoskeletal: Normal range of motion, no joint effusions. No gross deformities Neurological: he is alert and oriented to person, place, and time. No cranial nerve deficit. Coordination, balance, strength, speech and gait are normal.  Skin: Skin is warm and dry. No rash noted. No erythema. she has vitiligo  Psychiatric: Patient has a normal mood and affect. behavior is normal. Judgment and thought content normal.   Fall Risk: Fall Risk  02/08/2020 10/03/2019 05/30/2019 05/30/2019 01/17/2019  Falls in the past year? 0 0 0 0 0  Number falls in past yr: 0 0 0 0 0  Injury with Fall? 0 0 0 0 0     Functional Status Survey: Is the patient deaf or have  difficulty hearing?: No Does the patient have difficulty seeing, even when wearing glasses/contacts?: No Does the patient have difficulty concentrating, remembering, or making decisions?: No Does the patient have difficulty walking or climbing stairs?: No Does the patient have difficulty dressing or bathing?: No Does the patient have difficulty doing errands alone such as visiting a doctor's office or shopping?: No   Assessment & Plan   1. Type 2 diabetes with nephropathy (HCC)  - Hemoglobin A1c - TSH - Urine Microalbumin w/creat. ratio - POCT glycosylated hemoglobin (Hb A1C)  2. Well adult exam   3. Need for hepatitis C screening test  - Hepatitis C antibody  4. Need for Tdap vaccination  - Tdap vaccine greater than or equal to 7yo IM  5. Hypertension, benign  - CBC with Differential/Platelet - COMPLETE METABOLIC PANEL WITH GFR  6. Controlled type 2 diabetes mellitus with neuropathy (HCC)  -  Semaglutide, 1 MG/DOSE, (OZEMPIC, 1 MG/DOSE,) 2 MG/1.5ML SOPN; Inject 0.75 mLs (1 mg total) into the skin once a week.  Dispense: 3 mL; Refill: 5 - gabapentin (NEURONTIN) 600 MG tablet; Take 1.5 tablets (900 mg total) by mouth at bedtime.  Dispense: 135 tablet; Refill: 1  7. Raynaud's disease without gangrene   8. B12 deficiency  - Vitamin B12  9. GERD without esophagitis  - omeprazole (PRILOSEC) 20 MG capsule; Take 1 capsule (20 mg total) by mouth 2 (two) times daily.  Dispense: 180 capsule; Refill: 1  10. Vitiligo   11. Vitamin D deficiency  - VITAMIN D 25 Hydroxy (Vit-D Deficiency, Fractures)  12. Migraine without aura and without status migrainosus, not intractable  - ondansetron (ZOFRAN ODT) 4 MG disintegrating tablet; Take 1 tablet (4 mg total) by mouth every 8 (eight) hours as needed for nausea or vomiting.  Dispense: 20 tablet; Refill: 0 - Rimegepant Sulfate (NURTEC) 75 MG TBDP; Take 1 tablet by mouth daily as needed.  Dispense: 8 tablet; Refill: 1  13. History of bariatric surgery   14. Chronic insomnia  - hydrOXYzine (ATARAX/VISTARIL) 10 MG tablet; Take 1 tablet (10 mg total) by mouth every 8 (eight) hours as needed.  Dispense: 90 tablet; Refill: 0 - zolpidem (AMBIEN) 10 MG tablet; Take 1 tablet (10 mg total) by mouth at bedtime.  Dispense: 90 tablet; Refill: 0  15. Mild major depression, single episode ([place])   16. Pure hypercholesterolemia  - Lipid panel  17. Peripheral polyneuropathy  - gabapentin (NEURONTIN) 600 MG tablet; Take 1.5 tablets (900 mg total) by mouth at bedtime.  Dispense: 135 tablet; Refill: 1  -USPSTF grade A and B recommendations reviewed with patient; age-appropriate recommendations, preventive care, screening tests, etc discussed and encouraged; healthy living encouraged; see AVS for patient education given to patient -Discussed importance of 150 minutes of physical activity weekly, eat two servings of fish weekly, eat one serving of tree  nuts ( cashews, pistachios, pecans, almonds.Marland Kitchen) every other day, eat 6 servings of fruit/vegetables daily and drink plenty of water and avoid sweet beverages.

## 2020-02-22 ENCOUNTER — Telehealth: Payer: Self-pay

## 2020-02-22 NOTE — Telephone Encounter (Signed)
Prior Authorization was initiated for Nurtec ODT 75 mg and Approved. The medication is approved through 02/21/2020-08/21/2020.

## 2020-04-15 ENCOUNTER — Observation Stay (HOSPITAL_COMMUNITY)
Admission: EM | Admit: 2020-04-15 | Discharge: 2020-04-16 | Disposition: A | Discharge status: Home / Self Care | Payer: No Typology Code available for payment source | Attending: Internal Medicine | Admitting: Internal Medicine

## 2020-04-15 ENCOUNTER — Other Ambulatory Visit: Payer: Self-pay

## 2020-04-15 ENCOUNTER — Encounter (HOSPITAL_COMMUNITY): Payer: Self-pay

## 2020-04-15 ENCOUNTER — Emergency Department (HOSPITAL_COMMUNITY): Payer: No Typology Code available for payment source

## 2020-04-15 DIAGNOSIS — R200 Anesthesia of skin: Secondary | ICD-10-CM | POA: Diagnosis not present

## 2020-04-15 DIAGNOSIS — I7300 Raynaud's syndrome without gangrene: Secondary | ICD-10-CM | POA: Insufficient documentation

## 2020-04-15 DIAGNOSIS — R55 Syncope and collapse: Secondary | ICD-10-CM | POA: Insufficient documentation

## 2020-04-15 DIAGNOSIS — E1140 Type 2 diabetes mellitus with diabetic neuropathy, unspecified: Secondary | ICD-10-CM | POA: Diagnosis not present

## 2020-04-15 DIAGNOSIS — Z79899 Other long term (current) drug therapy: Secondary | ICD-10-CM | POA: Insufficient documentation

## 2020-04-15 DIAGNOSIS — G459 Transient cerebral ischemic attack, unspecified: Secondary | ICD-10-CM

## 2020-04-15 DIAGNOSIS — Z20822 Contact with and (suspected) exposure to covid-19: Secondary | ICD-10-CM | POA: Insufficient documentation

## 2020-04-15 DIAGNOSIS — I10 Essential (primary) hypertension: Secondary | ICD-10-CM | POA: Diagnosis not present

## 2020-04-15 DIAGNOSIS — R202 Paresthesia of skin: Secondary | ICD-10-CM | POA: Diagnosis not present

## 2020-04-15 LAB — COMPREHENSIVE METABOLIC PANEL
ALT: 16 U/L (ref 0–44)
AST: 18 U/L (ref 15–41)
Albumin: 4.6 g/dL (ref 3.5–5.0)
Alkaline Phosphatase: 84 U/L (ref 38–126)
Anion gap: 9 (ref 5–15)
BUN: 19 mg/dL (ref 6–20)
CO2: 25 mmol/L (ref 22–32)
Calcium: 8.9 mg/dL (ref 8.9–10.3)
Chloride: 106 mmol/L (ref 98–111)
Creatinine, Ser: 0.74 mg/dL (ref 0.44–1.00)
GFR calc Af Amer: >60 mL/min (ref >60)
GFR calc non Af Amer: >60 mL/min (ref >60)
Glucose, Bld: 104 mg/dL — ABNORMAL HIGH (ref 70–99)
Potassium: 3.6 mmol/L (ref 3.5–5.1)
Sodium: 140 mmol/L (ref 135–145)
Total Bilirubin: 0.2 mg/dL — ABNORMAL LOW (ref 0.3–1.2)
Total Protein: 7.7 g/dL (ref 6.5–8.1)

## 2020-04-15 LAB — CBC
HCT: 42.3 % (ref 36.0–46.0)
Hemoglobin: 12.3 g/dL (ref 12.0–15.0)
MCH: 25.7 pg — ABNORMAL LOW (ref 26.0–34.0)
MCHC: 29.1 g/dL — ABNORMAL LOW (ref 30.0–36.0)
MCV: 88.3 fL (ref 80.0–100.0)
Platelets: 418 10*3/uL — ABNORMAL HIGH (ref 150–400)
RBC: 4.79 MIL/uL (ref 3.87–5.11)
RDW: 15 % (ref 11.5–15.5)
WBC: 9.6 10*3/uL (ref 4.0–10.5)
nRBC: 0 % (ref 0.0–0.2)

## 2020-04-15 LAB — PROTIME-INR
INR: 0.9 (ref 0.8–1.2)
Prothrombin Time: 12 seconds (ref 11.4–15.2)

## 2020-04-15 LAB — APTT: aPTT: 31 seconds (ref 24–36)

## 2020-04-15 LAB — DIFFERENTIAL
Abs Immature Granulocytes: 0.03 10*3/uL (ref 0.00–0.07)
Basophils Absolute: 0.1 10*3/uL (ref 0.0–0.1)
Basophils Relative: 1 %
Eosinophils Absolute: 0.1 10*3/uL (ref 0.0–0.5)
Eosinophils Relative: 1 %
Immature Granulocytes: 0 %
Lymphocytes Relative: 28 %
Lymphs Abs: 2.6 10*3/uL (ref 0.7–4.0)
Monocytes Absolute: 0.7 10*3/uL (ref 0.1–1.0)
Monocytes Relative: 7 %
Neutro Abs: 6.1 10*3/uL (ref 1.7–7.7)
Neutrophils Relative %: 63 %

## 2020-04-15 LAB — CBG MONITORING, ED: Glucose-Capillary: 63 mg/dL — ABNORMAL LOW (ref 70–99)

## 2020-04-15 LAB — SARS CORONAVIRUS 2 BY RT PCR (HOSPITAL ORDER, PERFORMED IN ~~LOC~~ HOSPITAL LAB): SARS Coronavirus 2: NEGATIVE

## 2020-04-15 LAB — ETHANOL: Alcohol, Ethyl (B): <10 mg/dL (ref <10)

## 2020-04-15 MED ORDER — KETOROLAC TROMETHAMINE 30 MG/ML IJ SOLN
30.0000 mg | Freq: Once | INTRAMUSCULAR | Status: AC
  Administered 2020-04-15: 30 mg via INTRAVENOUS
  Filled 2020-04-15: qty 1

## 2020-04-15 MED ORDER — METOCLOPRAMIDE HCL 5 MG/ML IJ SOLN
10.0000 mg | Freq: Once | INTRAMUSCULAR | Status: AC
  Administered 2020-04-15: 10 mg via INTRAVENOUS
  Filled 2020-04-15: qty 2

## 2020-04-15 MED ORDER — DIPHENHYDRAMINE HCL 50 MG/ML IJ SOLN
25.0000 mg | Freq: Once | INTRAMUSCULAR | Status: AC
  Administered 2020-04-15: 25 mg via INTRAVENOUS
  Filled 2020-04-15: qty 1

## 2020-04-15 MED ORDER — NITROGLYCERIN 2 % TD OINT
0.5000 [in_us] | TOPICAL_OINTMENT | Freq: Once | TRANSDERMAL | Status: DC
  Filled 2020-04-15: qty 1

## 2020-04-15 NOTE — ED Triage Notes (Signed)
Pt to er, pt states that she is here because "I feel funny"  Pt states that tonight she was going to take a bath and started running some bath water and noticed her L middle finger was blue and had some tingling in her L arm. Pt also reports decreased sensation in L leg

## 2020-04-15 NOTE — ED Notes (Signed)
Pt family member came behind nurses station to inform Nira Conn, RN that pt was experiencing chest pain. This nurse kindly requested family member to stay out of the nurses station due to sensitive patient information and medications. Family member informed me "I am a nurse." She then explained that patient was experiencing chest pain to which I told her I would inform the doctor and perform and EKG. Family stated "No, I do not want you to do anything." MD Zammit notified.

## 2020-04-15 NOTE — Consult Note (Signed)
[facility] Tele[facility]urology Consult Services   Date of Service:   04/15/2020 21:39:35  Impression:     .  R20.2 - Paresthesia of skin  Comments/Sign-Out: 49 year old female with history phenomenon, hypertension, diabetes, presents to the emergency department with a headache and left-sided paresthesias. She also has tingling specifically in the left index finger and she reports that it does appear to be a different color and throbbing in nature. Overall, her symptoms do not appear to be consistent with acute ischemic stroke. Not a TPA candidate anyways due to minimal nondisabling symptoms; I discussed the risk and benefits of TPA, patient was agreeable not to proceed with IV thrombolysis at this point time. However, the paresthesias in her left arm and left leg could be secondary to migraine phenomenon. Another possible etiology for her symptoms in her left neck stinger could be a vascular phenomenon, as it does appear to be discolored when compared to the other fingers, and there is a possibility that she could be having either rainouts phenomenon versus some other vascular issue. Therefore, I would recommend evaluation and possible vascular imaging of the left upper extremity. Headache cocktail. If she continues to have symptoms, recommend admission for stroke work-up as well.  Metrics: Last Known Well: 04/15/2020 19:45:00 [facility] Notification Time: 04/15/2020 21:39:35 Arrival Time: 04/15/2020 21:19:00 Stamp Time: 04/15/2020 21:39:35 Time First Login Attempt: 04/15/2020 21:42:16 Symptoms: Paresthesias. NIHSS Start Assessment Time: 04/15/2020 21:45:12 Patient is not a candidate for Thrombolytic. Thrombolytic Medical Decision: 04/15/2020 21:47:03 Patient was not deemed candidate for Thrombolytic because of following reasons: No disabling symptoms. Other Diagnosis suspected.  CT head showed no acute hemorrhage or acute core infarct.  ED Physician notified of  diagnostic impression and management plan on 04/15/2020 21:58:30  Advanced Imaging: Advanced Imaging Not Recommended because:  Clinical Presentation is not Suggestive of LVO and NIHSS is <6   Our recommendations are outlined below.  Recommendations:     .  Activate Stroke Protocol Admission/Order Set     .  Stroke/Telemetry Floor     .  Neuro Checks     .  Bedside Swallow Eval     .  DVT Prophylaxis     .  IV Fluids, Normal Saline     .  Head of Bed 30 Degrees     .  Euglycemia and Avoid Hyperthermia (PRN Acetaminophen)     .  Initiate Aspirin 81 MG Daily     .  MRI brain without IV contrast     .  MRA head/neck     .  TSH, A1c, lipid profile     .  Transthoracic echocardiogram     .  Continuous telemetry     .  Physical, occupational, and speech therapies     .  q4h neuro checks/NIHSS     .  NPO until bedside swallow     .  Neurology follow-up  Routine Consultation with [facility] for Follow up Care  Sign Out:     .  Discussed with Emergency Department Provider    ------------------------------------------------------------------------------  History of Present Illness: Patient is a 49 year old Female.  Patient was brought by private transportation with symptoms of Paresthesias.  This is a 49 year old female history of Raynaud's phenomenon, migraine aura, neuropathy, proteinuria, hypertension, diabetes. She presents to the emergency department due to left-sided paresthesias. She began having a headache at around noon today, however at around 7:45 PM she began having issues with her left index finger she felt like  it was "throbbing" and it turned "blue". She has had similar symptoms in the past with Raynaud's, however usually turns white and it goes away after a few minutes, however this was different and seems to have persisted. She then felt like the dorsum of her left forearm was tingling, followed by tingling in the left leg. On exam, she has no clear focal  deficits other than mildly decreased sensation in the left forearm when compared to the right, about 10% decreased sensation for the right. She has no drift of the upper or lower extremities, no other focal deficits. She denies any history of strokes or TIAs. She is not normally on aspirin or blood thinners. She did take an aspirin earlier today when she began having symptoms. She reports that her migraine headaches, or other sensory auras with her migraines. Her current headache is her typical migraine.   Past Medical History:     . Hypertension     . Diabetes Mellitus     . migraine neuropathy Raynaud's Proteinuria  Anticoagulant use:  No  Antiplatelet use: No    Examination: BP(145/79), Pulse(87), Blood Glucose(63) 1A: Level of Consciousness - Alert; keenly responsive + 0 1B: Ask Month and Age - Both Questions Right + 0 1C: Blink Eyes & Squeeze Hands - Performs Both Tasks + 0 2: Test Horizontal Extraocular Movements - Normal + 0 3: Test Visual Fields - No Visual Loss + 0 4: Test Facial Palsy (Use Grimace if Obtunded) - Normal symmetry + 0 5A: Test Left Arm Motor Drift - No Drift for 10 Seconds + 0 5B: Test Right Arm Motor Drift - No Drift for 10 Seconds + 0 6A: Test Left Leg Motor Drift - No Drift for 5 Seconds + 0 6B: Test Right Leg Motor Drift - No Drift for 5 Seconds + 0 7: Test Limb Ataxia (FNF/Heel-Shin) - No Ataxia + 0 8: Test Sensation - Mild-Moderate Loss: Less Sharp/More Dull + 1 9: Test Language/Aphasia - Normal; No aphasia + 0 10: Test Dysarthria - Normal + 0 11: Test Extinction/Inattention - No abnormality + 0  NIHSS Score: 1  Pre-Morbid Modified Rankin Scale: 0 Points = No symptoms at all   Patient/Family was informed the Neurology Consult would occur via TeleHealth consult by way of interactive audio and video telecommunications and consented to receiving care in this manner.   Patient is being evaluated for possible acute neurologic impairment and high  probability of imminent or life-threatening deterioration. I spent total of 35 minutes providing care to this patient, including time for face to face visit via telemedicine, review of medical records, imaging studies and discussion of findings with providers, the patient and/or family.   Dr Knox Royalty   [facility] [phone]  Case [id number]

## 2020-04-15 NOTE — ED Provider Notes (Signed)
[facility] EMERGENCY DEPARTMENT Provider Note   CSN: [id number] Arrival date & time: 04/15/20  2119     History Chief Complaint  Patient presents with  . Hand Pain  . Near Syncope    Shannon Soto is a 49 y.o. female.  Patient complains of left arm numbness and left leg numbness.  She also has blueness to her left middle finger.  Patient has a history of Raynaud's disease.  The history is provided by the patient and a relative.  Hand Pain This is a recurrent problem. The current episode started 1 to 2 hours ago. The problem occurs constantly. The problem has not changed since onset.Pertinent negatives include no chest pain, no abdominal pain and no headaches. Nothing aggravates the symptoms. Nothing relieves the symptoms. She has tried nothing for the symptoms. The treatment provided no relief.       Past Medical History:  Diagnosis Date  . Diabetes mellitus without complication ([place])    improved after gastric sleeve  . GERD (gastroesophageal reflux disease)   . Hyperlipidemia   . Hypertension   . Migraine headache    2x/mo  . Orthodontics    braces  . Raynaud's syndrome     Patient Active Problem List   Diagnosis Date Noted  . Encounter for screening colonoscopy   . Polyp of sigmoid colon   . Controlled type 2 diabetes mellitus with neuropathy ([place]) 05/12/2016  . Raynaud's phenomenon 05/12/2016  . Intertrigo 09/06/2015  . Migraine headache without aura 02/28/2015  . History of obesity 02/28/2015  . Hyperlipidemia 02/28/2015  . GERD (gastroesophageal reflux disease) 02/28/2015  . Vitamin D deficiency 02/28/2015  . Chronic constipation 02/28/2015  . Chronic insomnia 02/28/2015  . Peripheral neuropathy 02/28/2015  . Vitiligo 02/28/2015  . Lower leg mass 02/28/2015  . Depression with anxiety 02/28/2015  . History of bariatric surgery 11/05/2014    Past Surgical History:  Procedure Laterality Date  . ABDOMINAL HYSTERECTOMY    . APPENDECTOMY    .  BREAST SURGERY    . BREATH TEK H PYLORI N/A 08/27/2014   Procedure: BREATH TEK H PYLORI;  Surgeon: Pedro Earls, MD;  Location: [facility];  Service: General;  Laterality: N/A;  . CATARACT EXTRACTION W/PHACO Left 09/20/2019   Procedure: CATARACT EXTRACTION PHACO AND INTRAOCULAR LENS PLACEMENT (IOC) LEFT 0.74 00:12.3 6.0%;  Surgeon: Leandrew Koyanagi, MD;  Location: [facility];  Service: Ophthalmology;  Laterality: Left;  . CATARACT EXTRACTION W/PHACO Right 10/11/2019   Procedure: CATARACT EXTRACTION PHACO AND INTRAOCULAR LENS PLACEMENT (IOC)  RIGHT 1.55 00:24.1 6.4%;  Surgeon: Leandrew Koyanagi, MD;  Location: [place];  Service: Ophthalmology;  Laterality: Right;  diabetic  . COLONOSCOPY WITH PROPOFOL N/A 02/17/2019   Procedure: COLONOSCOPY WITH PROPOFOL;  Surgeon: Lucilla Lame, MD;  Location: [place];  Service: Endoscopy;  Laterality: N/A;  . HIATAL HERNIA REPAIR  11/05/2014   Procedure: LAPAROSCOPIC REPAIR OF HIATAL HERNIA;  Surgeon: Johnathan Hausen, MD;  Location: [facility];  Service: General;;  . LAPAROSCOPIC GASTRIC SLEEVE RESECTION N/A 11/05/2014   Procedure: LAPAROSCOPIC GASTRIC SLEEVE RESECTION;  Surgeon: Johnathan Hausen, MD;  Location: [facility];  Service: General;  Laterality: N/A;  . POLYPECTOMY  02/17/2019   Procedure: POLYPECTOMY;  Surgeon: Lucilla Lame, MD;  Location: [place];  Service: Endoscopy;;  . REDUCTION MAMMAPLASTY Bilateral 1997  . UPPER GI ENDOSCOPY  11/05/2014   Procedure: UPPER GI ENDOSCOPY;  Surgeon: Johnathan Hausen, MD;  Location: [facility];  Service: General;;  OB History   No obstetric history on file.     Family History  Problem Relation Age of Onset  . Diabetes Paternal Uncle   . Stroke Father   . Hypertension Other   . Breast cancer Maternal Grandmother 72    Social History   Tobacco Use  . Smoking status: Never Smoker  . Smokeless tobacco: Never Used  Vaping Use  . Vaping Use: Never used  Substance Use  Topics  . Alcohol use: No    Alcohol/week: 0.0 standard drinks    Comment: occasional   . Drug use: No    Home Medications Prior to Admission medications   Medication Sig Start Date End Date Taking? Authorizing Provider  amLODipine (NORVASC) 10 MG tablet Take 1 tablet (10 mg total) by mouth daily. 10/03/19   Steele Sizer, MD  Calcium Carb-Cholecalciferol (CALCIUM-VITAMIN D3) 500-400 MG-UNIT TABS Take 1 tablet by mouth 3 (three) times daily.    [provider]  cyanocobalamin 500 MCG tablet Take 1 mcg by mouth every morning.    [provider]  ferrous gluconate ([employer] IRON) 240 (27 FE) MG tablet Take 1 tablet (240 mg total) by mouth 3 (three) times daily with meals. 02/28/15   Steele Sizer, MD  FLUoxetine (PROZAC) 10 MG capsule Take 1 capsule (10 mg total) by mouth daily. 02/05/20   Steele Sizer, MD  gabapentin (NEURONTIN) 600 MG tablet Take 1.5 tablets (900 mg total) by mouth at bedtime. 02/08/20   Steele Sizer, MD  hydrOXYzine (ATARAX/VISTARIL) 10 MG tablet Take 1 tablet (10 mg total) by mouth every 8 (eight) hours as needed. 02/08/20   Steele Sizer, MD  losartan (COZAAR) 100 MG tablet Take 1 tablet (100 mg total) by mouth daily. 02/05/20   Steele Sizer, MD  Multiple Vitamins-Minerals (MULTIVITAMIN ADULTS PO) Take 1 tablet by mouth daily. 08/22/19   [provider]  nystatin (NYSTATIN) powder Apply topically 4 (four) times daily. 05/13/18   Steele Sizer, MD  omeprazole (PRILOSEC) 20 MG capsule Take 1 capsule (20 mg total) by mouth 2 (two) times daily. 02/08/20   Steele Sizer, MD  ondansetron (ZOFRAN ODT) 4 MG disintegrating tablet Take 1 tablet (4 mg total) by mouth every 8 (eight) hours as needed for nausea or vomiting. 02/08/20   Steele Sizer, MD  pravastatin (PRAVACHOL) 20 MG tablet Take 1 tablet (20 mg total) by mouth daily. 02/05/20   Steele Sizer, MD  Rimegepant Sulfate (NURTEC) 75 MG TBDP Take 1 tablet by mouth daily as needed. 02/08/20    Steele Sizer, MD  Semaglutide, 1 MG/DOSE, (OZEMPIC, 1 MG/DOSE,) 2 MG/1.5ML SOPN Inject 0.75 mLs (1 mg total) into the skin once a week. 02/08/20   Steele Sizer, MD  triamcinolone cream (KENALOG) 0.1 % Apply topically 2 (two) times daily. 10/03/19   Steele Sizer, MD  zolpidem (AMBIEN) 10 MG tablet Take 1 tablet (10 mg total) by mouth at bedtime. 02/08/20   Steele Sizer, MD    Allergies    Patient has no known allergies.  Review of Systems   Review of Systems  Constitutional: Negative for appetite change and fatigue.  HENT: Negative for congestion, ear discharge and sinus pressure.   Eyes: Negative for discharge.  Respiratory: Negative for cough.   Cardiovascular: Positive for near-syncope. Negative for chest pain.  Gastrointestinal: Negative for abdominal pain and diarrhea.  Genitourinary: Negative for frequency and hematuria.  Musculoskeletal: Negative for back pain.       Blue discoloration of left middle finger  Skin:  Negative for rash.  Neurological: Negative for seizures and headaches.       Numbness left arm and left leg.  Psychiatric/Behavioral: Negative for hallucinations.    Physical Exam Updated Vital Signs BP (!) 145/79 (BP Location: Right Arm)   Pulse 87   Temp 97.9 F (36.6 C) (Oral)   Resp 18   Ht 5\' 3"  (1.6 m)   Wt 61.1 kg   SpO2 99%   BMI 23.88 kg/m   Physical Exam Vitals and nursing note reviewed.  Constitutional:      Appearance: She is well-developed.  HENT:     Head: Normocephalic.     Right Ear: Tympanic membrane normal.     Nose: Nose normal.  Eyes:     General: No scleral icterus.    Conjunctiva/sclera: Conjunctivae normal.  Neck:     Thyroid: No thyromegaly.  Cardiovascular:     Rate and Rhythm: Normal rate and regular rhythm.     Heart sounds: No murmur heard.  No friction rub. No gallop.   Pulmonary:     Breath sounds: No stridor. No wheezing or rales.  Chest:     Chest wall: No tenderness.  Abdominal:     General: There is  no distension.     Tenderness: There is no abdominal tenderness. There is no rebound.  Musculoskeletal:        General: Normal range of motion.     Cervical back: Neck supple.     Comments: Blue discoloration of the left middle finger.  Capillary refill normal pulses normal and hand  Lymphadenopathy:     Cervical: No cervical adenopathy.  Skin:    Findings: No erythema or rash.  Neurological:     Mental Status: She is alert and oriented to person, place, and time.     Motor: No abnormal muscle tone.     Coordination: Coordination normal.  Psychiatric:        Behavior: Behavior normal.           ED Results / Procedures / Treatments   Labs (all labs ordered are listed, but only abnormal results are displayed) Labs Reviewed  CBC - Abnormal; Notable for the following components:      Result Value   MCH 25.7 (*)    MCHC 29.1 (*)    Platelets 418 (*)    All other components within normal limits  CBG MONITORING, ED - Abnormal; Notable for the following components:   Glucose-Capillary 63 (*)    All other components within normal limits  SARS CORONAVIRUS 2 BY RT PCR (HOSPITAL ORDER, [facility])  PROTIME-INR  APTT  DIFFERENTIAL  ETHANOL  COMPREHENSIVE METABOLIC PANEL  RAPID URINE DRUG SCREEN, HOSP PERFORMED  URINALYSIS, ROUTINE W REFLEX MICROSCOPIC  I-STAT CHEM 8, ED  POC URINE PREG, ED    EKG None  Radiology CT HEAD CODE STROKE WO CONTRAST  Result Date: 04/15/2020 CLINICAL DATA:  Code stroke.  Acute neuro deficit. EXAM: CT HEAD WITHOUT CONTRAST TECHNIQUE: Contiguous axial images were obtained from the base of the skull through the vertex without intravenous contrast. COMPARISON:  None. FINDINGS: Brain: No evidence of acute infarction, hemorrhage, hydrocephalus, extra-axial collection or mass lesion/mass effect. Vascular: Negative for hyperdense vessel Skull: Negative Sinuses/Orbits: Mild mucosal edema paranasal sinuses. Bilateral cataract  extraction. Other: None ASPECTS ([place] Stroke Program Early CT Score) - Ganglionic level infarction (caudate, lentiform nuclei, internal capsule, insula, M1-M3 cortex): 7 - Supraganglionic infarction (M4-M6 cortex): 3 Total score (0-10  with 10 being normal): 10 IMPRESSION: 1. Negative CT head 2. ASPECTS is 10 3. These results were called by telephone at the time of interpretation on 04/15/2020 at 9:40 pm to provider [facility] Tahj Lindseth , who verbally acknowledged these results. Electronically Signed   By: Franchot Gallo M.D.   On: 04/15/2020 21:41    Procedures Procedures (including critical care time)  Medications Ordered in ED Medications  ketorolac (TORADOL) 30 MG/ML injection 30 mg (has no administration in time range)  metoCLOPramide (REGLAN) injection 10 mg (has no administration in time range)  diphenhydrAMINE (BENADRYL) injection 25 mg (has no administration in time range)  CRITICAL CARE Performed by: Milton Ferguson Total critical care time: 45 minutes Critical care time was exclusive of separately billable procedures and treating other patients. Critical care was necessary to treat or prevent imminent or life-threatening deterioration. Critical care was time spent personally by me on the following activities: development of treatment plan with patient and/or surrogate as well as nursing, discussions with consultants, evaluation of patient's response to treatment, examination of patient, obtaining history from patient or surrogate, ordering and performing treatments and interventions, ordering and review of laboratory studies, ordering and review of radiographic studies, pulse oximetry and re-evaluation of patient's condition.   ED Course  I have reviewed the triage vital signs and the nursing notes.  Pertinent labs & imaging results that were available during my care of the patient were reviewed by me and considered in my medical decision making (see chart for details).    Neurology saw  the patient and stated she is not a TPA candidate and would recommend admission for stroke work-up and MRI tomorrow.  I believe her left hand has Raynaud's syndrome and we will place nitroglycerin ointment on it        This patient presents to the ED for concern of numbness, this involves an extensive number of treatment options, and is a complaint that carries with it a high risk of complications and morbidity.  The differential diagnosis includes stroke paresthesias   Lab Tests:   I Ordered, reviewed, and interpreted labs, which included CBC and chemistries which showed a mild anemia  Medicines ordered:   I ordered medication nitroglycerin ointment for Raynaud's  Imaging Studies ordered:   I ordered imaging studies which included CT head unremarkable  I independently visualized and interpreted imaging which showed normal  Additional history obtained:   Additional history obtained from sister  Previous records obtained and reviewed.  Consultations Obtained:   I consulted neurology and hospitalist and discussed lab and imaging findings  Reevaluation:  After the interventions stated above, I reevaluated the patient and found somewhat improved  Critical Interventions:  Marland Kitchen     MDM Rules/Calculators/A&P                          Patient with TIA symptoms to her arm and leg with Raynaud's and left middle finger Final Clinical Impression(s) / ED Diagnoses Final diagnoses:  None    Rx / DC Orders ED Discharge Orders    None       Milton Ferguson, MD 04/18/20 1503

## 2020-04-16 ENCOUNTER — Observation Stay (HOSPITAL_BASED_OUTPATIENT_CLINIC_OR_DEPARTMENT_OTHER): Payer: No Typology Code available for payment source

## 2020-04-16 ENCOUNTER — Observation Stay (HOSPITAL_COMMUNITY): Payer: No Typology Code available for payment source

## 2020-04-16 ENCOUNTER — Encounter: Payer: Self-pay | Admitting: Family Medicine

## 2020-04-16 DIAGNOSIS — I361 Nonrheumatic tricuspid (valve) insufficiency: Secondary | ICD-10-CM

## 2020-04-16 DIAGNOSIS — I371 Nonrheumatic pulmonary valve insufficiency: Secondary | ICD-10-CM | POA: Diagnosis not present

## 2020-04-16 DIAGNOSIS — I7300 Raynaud's syndrome without gangrene: Secondary | ICD-10-CM | POA: Diagnosis not present

## 2020-04-16 DIAGNOSIS — R202 Paresthesia of skin: Secondary | ICD-10-CM | POA: Diagnosis not present

## 2020-04-16 DIAGNOSIS — R200 Anesthesia of skin: Secondary | ICD-10-CM

## 2020-04-16 LAB — COMPREHENSIVE METABOLIC PANEL
ALT: 14 U/L (ref 0–44)
AST: 13 U/L — ABNORMAL LOW (ref 15–41)
Albumin: 3.9 g/dL (ref 3.5–5.0)
Alkaline Phosphatase: 70 U/L (ref 38–126)
Anion gap: 9 (ref 5–15)
BUN: 14 mg/dL (ref 6–20)
CO2: 23 mmol/L (ref 22–32)
Calcium: 8.5 mg/dL — ABNORMAL LOW (ref 8.9–10.3)
Chloride: 107 mmol/L (ref 98–111)
Creatinine, Ser: 0.54 mg/dL (ref 0.44–1.00)
GFR calc Af Amer: >60 mL/min (ref >60)
GFR calc non Af Amer: >60 mL/min (ref >60)
Glucose, Bld: 100 mg/dL — ABNORMAL HIGH (ref 70–99)
Potassium: 3.7 mmol/L (ref 3.5–5.1)
Sodium: 139 mmol/L (ref 135–145)
Total Bilirubin: 0.4 mg/dL (ref 0.3–1.2)
Total Protein: 6.6 g/dL (ref 6.5–8.1)

## 2020-04-16 LAB — TROPONIN I (HIGH SENSITIVITY)
Troponin I (High Sensitivity): <2 ng/L (ref <18)
Troponin I (High Sensitivity): <2 ng/L (ref <18)

## 2020-04-16 LAB — LIPID PANEL
Cholesterol: 141 mg/dL (ref 0–200)
HDL: 50 mg/dL (ref >40)
LDL Cholesterol: 86 mg/dL (ref 0–99)
Total CHOL/HDL Ratio: 2.8 RATIO
Triglycerides: 24 mg/dL (ref <150)
VLDL: 5 mg/dL (ref 0–40)

## 2020-04-16 LAB — ECHOCARDIOGRAM COMPLETE
AR max vel: 2.23 cm2
AV Area VTI: 2.19 cm2
AV Area mean vel: 2.26 cm2
AV Mean grad: 3.7 mmHg
AV Peak grad: 8.6 mmHg
Ao pk vel: 1.47 m/s
Area-P 1/2: 3.02 cm2
Height: 63 in
Weight: 2156.8 oz

## 2020-04-16 LAB — FERRITIN: Ferritin: 87 ng/mL (ref 11–307)

## 2020-04-16 LAB — TSH: TSH: 2.697 u[IU]/mL (ref 0.350–4.500)

## 2020-04-16 LAB — CBC
HCT: 36.8 % (ref 36.0–46.0)
Hemoglobin: 10.9 g/dL — ABNORMAL LOW (ref 12.0–15.0)
MCH: 25 pg — ABNORMAL LOW (ref 26.0–34.0)
MCHC: 29.6 g/dL — ABNORMAL LOW (ref 30.0–36.0)
MCV: 84.4 fL (ref 80.0–100.0)
Platelets: 403 10*3/uL — ABNORMAL HIGH (ref 150–400)
RBC: 4.36 MIL/uL (ref 3.87–5.11)
RDW: 14.9 % (ref 11.5–15.5)
WBC: 9.8 10*3/uL (ref 4.0–10.5)
nRBC: 0 % (ref 0.0–0.2)

## 2020-04-16 LAB — VITAMIN B12: Vitamin B-12: 518 pg/mL (ref 180–914)

## 2020-04-16 LAB — VITAMIN D 25 HYDROXY (VIT D DEFICIENCY, FRACTURES): Vit D, 25-Hydroxy: 24.37 ng/mL — ABNORMAL LOW (ref 30–100)

## 2020-04-16 LAB — HEMOGLOBIN A1C
Hgb A1c MFr Bld: 5.3 % (ref 4.8–5.6)
Mean Plasma Glucose: 105 mg/dL

## 2020-04-16 LAB — MAGNESIUM: Magnesium: 2.1 mg/dL (ref 1.7–2.4)

## 2020-04-16 LAB — FOLATE: Folate: 12 ng/mL (ref >5.9)

## 2020-04-16 LAB — HIV ANTIBODY (ROUTINE TESTING W REFLEX): HIV Screen 4th Generation wRfx: NONREACTIVE

## 2020-04-16 LAB — GLUCOSE, CAPILLARY: Glucose-Capillary: 93 mg/dL (ref 70–99)

## 2020-04-16 LAB — CBG MONITORING, ED: Glucose-Capillary: 92 mg/dL (ref 70–99)

## 2020-04-16 LAB — TRANSFERRIN: Transferrin: 263 mg/dL (ref 192–382)

## 2020-04-16 LAB — PHOSPHORUS: Phosphorus: 2.5 mg/dL (ref 2.5–4.6)

## 2020-04-16 MED ORDER — PRAVASTATIN SODIUM 10 MG PO TABS
20.0000 mg | ORAL_TABLET | Freq: Every day | ORAL | Status: DC
  Filled 2020-04-16: qty 1

## 2020-04-16 MED ORDER — STROKE: EARLY STAGES OF RECOVERY BOOK
Freq: Once | Status: AC
  Filled 2020-04-16: qty 1

## 2020-04-16 MED ORDER — VITAMIN D 25 MCG (1000 UNIT) PO TABS
1000.0000 [IU] | ORAL_TABLET | Freq: Every day | ORAL | Status: DC
  Administered 2020-04-16: 1000 [IU] via ORAL
  Filled 2020-04-16: qty 1

## 2020-04-16 MED ORDER — FLUOXETINE HCL 10 MG PO CAPS
10.0000 mg | ORAL_CAPSULE | Freq: Every day | ORAL | Status: DC
  Administered 2020-04-16: 10 mg via ORAL
  Filled 2020-04-16: qty 1

## 2020-04-16 MED ORDER — ACETAMINOPHEN 650 MG RE SUPP: 650.0000 mg | RECTAL | Status: DC | PRN

## 2020-04-16 MED ORDER — FERROUS GLUCONATE 324 (38 FE) MG PO TABS
324.0000 mg | ORAL_TABLET | Freq: Three times a day (TID) | ORAL | Status: DC
  Administered 2020-04-16: 324 mg via ORAL
  Filled 2020-04-16 (×6): qty 1

## 2020-04-16 MED ORDER — PANTOPRAZOLE SODIUM 40 MG PO TBEC
40.0000 mg | DELAYED_RELEASE_TABLET | Freq: Every day | ORAL | Status: DC
  Administered 2020-04-16: 40 mg via ORAL
  Filled 2020-04-16: qty 1

## 2020-04-16 MED ORDER — HYDROXYZINE HCL 10 MG PO TABS: 10.0000 mg | ORAL_TABLET | Freq: Three times a day (TID) | ORAL | Status: DC | PRN

## 2020-04-16 MED ORDER — NITROGLYCERIN 2 % TD OINT
0.5000 [in_us] | TOPICAL_OINTMENT | Freq: Four times a day (QID) | TRANSDERMAL | Status: DC
  Administered 2020-04-16: 0.5 [in_us] via TOPICAL
  Filled 2020-04-16: qty 1

## 2020-04-16 MED ORDER — OXYCODONE HCL 5 MG PO TABS: 5.0000 mg | ORAL_TABLET | ORAL | Status: DC | PRN

## 2020-04-16 MED ORDER — NITROGLYCERIN 2 % TD OINT: 0.5000 [in_us] | TOPICAL_OINTMENT | Freq: Four times a day (QID) | TRANSDERMAL | 0 refills | Status: DC

## 2020-04-16 MED ORDER — LORAZEPAM 2 MG/ML IJ SOLN: 0.5000 mg | Freq: Four times a day (QID) | INTRAMUSCULAR | Status: DC | PRN

## 2020-04-16 MED ORDER — AMLODIPINE BESYLATE 5 MG PO TABS
10.0000 mg | ORAL_TABLET | Freq: Every day | ORAL | Status: DC
  Administered 2020-04-16: 10 mg via ORAL
  Filled 2020-04-16: qty 2

## 2020-04-16 MED ORDER — HEPARIN SODIUM (PORCINE) 5000 UNIT/ML IJ SOLN: 5000.0000 [IU] | Freq: Three times a day (TID) | INTRAMUSCULAR | Status: DC

## 2020-04-16 MED ORDER — ACETAMINOPHEN 160 MG/5ML PO SOLN: 650.0000 mg | ORAL | Status: DC | PRN

## 2020-04-16 MED ORDER — LOSARTAN POTASSIUM 50 MG PO TABS
100.0000 mg | ORAL_TABLET | Freq: Every day | ORAL | Status: DC
  Administered 2020-04-16: 100 mg via ORAL
  Filled 2020-04-16: qty 2

## 2020-04-16 MED ORDER — ASPIRIN EC 81 MG PO TBEC: 81.0000 mg | DELAYED_RELEASE_TABLET | Freq: Every day | ORAL | 0 refills | Status: AC

## 2020-04-16 MED ORDER — SODIUM CHLORIDE 0.9 % IV SOLN: INTRAVENOUS | Status: DC

## 2020-04-16 MED ORDER — INSULIN ASPART 100 UNIT/ML ~~LOC~~ SOLN: 0.0000 [IU] | Freq: Three times a day (TID) | SUBCUTANEOUS | Status: DC

## 2020-04-16 MED ORDER — SENNOSIDES-DOCUSATE SODIUM 8.6-50 MG PO TABS: 1.0000 | ORAL_TABLET | Freq: Every evening | ORAL | Status: DC | PRN

## 2020-04-16 MED ORDER — ACETAMINOPHEN 325 MG PO TABS: 650.0000 mg | ORAL_TABLET | ORAL | Status: DC | PRN

## 2020-04-16 NOTE — Progress Notes (Signed)
CODE STROKE  Call time  2126 Beeper   2128 Start   2129 End   2132  Soc   2132 Epic   2133  Rad Called  2135

## 2020-04-16 NOTE — Evaluation (Addendum)
Physical Therapy Evaluation Patient Details Name: Shannon Soto MRN: [id number] DOB: [birth year] Today's Date: 04/16/2020   History of Present Illness  Shannon Soto  is a 49 y.o. female, with history of Raynaud's, migraines, HLD, GERD, DMII, and gastric bypass presents to the ER with a chief complaint of left hand tingling. Patient reports that the paresthesias started at 7:45Pm while she was preparing herself a bath. Within a few minutes the paresthesias had radiated up her left arm, and started in the upper part of the left leg. Patient reports that she also noticed a blue color to her middle finger. She has had Raynaud's for years, and doesn't think that this feels like her normal Raynaud's. Patient had no chest pain, palpitations, change in vision, change in hearing, difficulty with speech, difficulty with swallowing, difficulty with ambulation, or facial droop. She waited for 90 minutes, and when the symptoms wouldn't resolve she had her aunt drive her to the ER. In the ER - teleneuro reported that this is more likely complex migraine or Raynaud's than TIA, but recommended TIA workup. At the time of my exam, symptoms are almost gone, and she reports that they were entirely gone before reglan was administered. At the time that reglan was administered, patient started having chest pain, palpitations, and again with paresthesias. These symptoms were improved at the time of my exam.    Clinical Impression  The patient demonstrated full independence with supine to sit, EOB sitting balance, sit to stand, and ambulation 120 feet during today's evaluation. The patient reported LE sensory abnormalities that differed from the usual neuropathy, although they were not detected upon gross sensation testing. There was general lower extremity weakness present, but this did not follow a stroke like or radicular pattern and was within functional limits for ambulation. The patient is currently at baseline  functioning for mobility, so patient discharged from physical therapy to care of nursing for ambulation daily as tolerated for length of stay at the venue listed below. Patient tolerated sitting up in chair with sister present at bedside after therapy. The patient received education on the importance of frequent ambulation during her stay in the hospital for vascular health and to reach out if any of her symptoms that we discussed today change.       Follow Up Recommendations No PT follow up    Equipment Recommendations  None recommended by PT    Recommendations for Other Services   None recommended by PT.     Precautions / Restrictions Precautions Precautions: None Restrictions Weight Bearing Restrictions: No      Mobility  Bed Mobility Overal bed mobility: Independent             General bed mobility comments: pt stated she was "anxious to lay flat in bed"  Transfers Overall transfer level: Independent Equipment used: None                Ambulation/Gait Ambulation/Gait assistance: Independent Gait Distance (Feet): 120 Feet Assistive device: None Gait Pattern/deviations: WFL(Within Functional Limits)     General Gait Details: pt reports the way she walks today is how she generally walks at her baseline  Stairs            Wheelchair Mobility    Modified Rankin (Stroke Patients Only)       Balance Overall balance assessment: Independent  Pertinent Vitals/Pain Pain Assessment: No/denies pain    Home Living Family/patient expects to be discharged to:: Private residence Living Arrangements: Children Available Help at Discharge: Family;Available PRN/intermittently Type of Home: House Home Access: Stairs to enter Entrance Stairs-Rails: Can reach both Entrance Stairs-Number of Steps: 7 Home Layout: One level Home Equipment: None      Prior Function Level of Independence:  Independent         Comments: shops, drives, ADL's all independent     Hand Dominance   Dominant Hand: Right    Extremity/Trunk Assessment   Upper Extremity Assessment Upper Extremity Assessment: Defer to OT evaluation    Lower Extremity Assessment Lower Extremity Assessment: RLE deficits/detail;LLE deficits/detail RLE Deficits / Details: hip flex 4/5, knee extension 4/5, DF 5/5 RLE Sensation: WNL LLE Deficits / Details: hip flex 5/5, knee ext 5/5, DF 4/5 LLE Sensation: WNL    Cervical / Trunk Assessment Cervical / Trunk Assessment: Normal  Communication   Communication: No difficulties  Cognition Arousal/Alertness: Awake/alert Behavior During Therapy: WFL for tasks assessed/performed Overall Cognitive Status: Within Functional Limits for tasks assessed                                        General Comments      Exercises     Assessment/Plan    PT Assessment Patent does not need any further PT services  PT Problem List         PT Treatment Interventions      PT Goals (Current goals can be found in the Care Plan section)  Acute Rehab PT Goals Patient Stated Goal: go home PT Goal Formulation: With patient Time For Goal Achievement: 04/19/20 Potential to Achieve Goals: Good    Frequency     Barriers to discharge        Co-evaluation               AM-PAC PT "6 Clicks" Mobility  Outcome Measure Help needed turning from your back to your side while in a flat bed without using bedrails?: None Help needed moving from lying on your back to sitting on the side of a flat bed without using bedrails?: None Help needed moving to and from a bed to a chair (including a wheelchair)?: None Help needed standing up from a chair using your arms (e.g., wheelchair or bedside chair)?: None Help needed to walk in hospital room?: None Help needed climbing 3-5 steps with a railing? : None 6 Click Score: 24    End of Session Equipment Utilized  During Treatment: Gait belt Activity Tolerance: Patient tolerated treatment well Patient left: in chair;with call bell/phone within reach;with family/visitor present Nurse Communication: Mobility status PT Visit Diagnosis: Unsteadiness on feet (R26.81);Muscle weakness (generalized) (M62.81);Other symptoms and signs involving the nervous system (R29.898)    Time: 2620-3559 PT Time Calculation (min) (ACUTE ONLY): 20 min   Charges:   PT Evaluation $PT Eval Moderate Complexity: 1 Mod PT Treatments $Therapeutic Activity: 8-22 mins        3:31 PM , 04/16/20 Karlyn Agee, SPT Physical Therapy with [facility] [phone] office   During this treatment session, the therapist was present, participating in and directing the treatment.  3:31 PM, 04/16/20 Lonell Grandchild, MPT Physical Therapist with [facility] [phone] office [phone] mobile phone

## 2020-04-16 NOTE — Progress Notes (Signed)
49 year old with history of Raynaud's, HLD, migraine, DM2, gastric bypass comes with left-sided hand tingling, reported did not feel like her Raynaud's. Seen by telemetry neuro who thought this was secondary to TIA versus complex migraine.  Patient is still having a lot of pain and cyanosis of her left middle finger.  States typically last less than an hour with this time it has been greater than a day.  Tells me is slightly better compared to yesterday today  Her vital signs are overall stable, afebrile, saturating 100% on room air. On physical exam her left middle finger is still cyanotic and slightly swollen.  No evidence of necrosis or gangrene.  Has some capillary refill.  Mild distress secondary to pain, clear to auscultation bilaterally, normal sinus rhythm.  Assessment and plan: -Left hand numbness and tingling -atypical chest pain -Raynaud's phenomenon -diabetes mellitus type 2 -history of gastric bypass/sleeve in 2016  -Unclear exact etiology TIA versus complex migraine. B12, folate and TSH are within normal limits. MRI brain- Negative. -Echocardiogram EF 65 to 70% -Vitamin D levels low, supplements ordered   Time spent-15 minutes Gerlean Ren MD [facility]

## 2020-04-16 NOTE — Progress Notes (Signed)
SLP Cancellation Note  Patient Details Name: Shannon Soto MRN: [id number] DOB: [birth year]   Cancelled treatment:       Reason Eval/Treat Not Completed: SLP screened, no needs identified, will sign off; SLP screened Pt in room. Pt denies any changes in swallowing, speech, language, or cognition. MRI negative for acute changes. SLE will be deferred at this time. Reconsult if indicated. SLP will sign off.   Thank you,  Genene Churn, [place]    [place]man 04/16/2020, 2:27 PM

## 2020-04-16 NOTE — Discharge Summary (Signed)
Physician Discharge Summary  Shannon Soto ZWC:[id number] DOB: [birth year] DOA: 04/15/2020  PCP: Steele Sizer, MD  Admit date: 04/15/2020 Discharge date: 04/16/2020  Admitted From: Home Disposition:  Home  Recommendations for Outpatient Follow-up:  1. Follow up with PCP in 1-2 weeks 2. Please obtain BMP/CBC in one week your next doctors visit.  3. Nitroglycerine ointment prescribed 4.  ASA daily 81mg  po started 5. Would benefit from outpatient follow-up with rheumatology.   Discharge Condition: Stable CODE STATUS: Full code Diet recommendation: Heart healthy  Brief/Interim Summary: 49 year old with history of Raynaud's, HLD, migraine, DM2, gastric bypass comes with left-sided hand tingling, reported did not feel like her Raynaud's. Seen by telemetry neuro who thought this was secondary to TIA versus complex migraine.  Patient CVA work-up was negative including MRI brain, echocardiogram. Her symptoms were likely secondary to Raynaud's given her left finger cyanosis and tingling.  Improved with nitroglycerin ointment.  Patient will monitor to go home after improvement therefore discharged home in stable condition.  She will follow-up outpatient with PCP and dermatology.  Nitroglycerin ointment has been prescribed, baby aspirin prescribed as well.  Medically stable for discharge.   Body mass index is 23.88 kg/m.         Discharge Diagnoses:  Active Problems:   Numbness and tingling in left hand     Subjective: In the morning patient was having left middle finger cyanosis with some tingling, after applying nitroglycerin it improved.  Patient wanted to go home with outpatient follow-up as recommended above.   Discharge Exam: Vitals:   04/16/20 1023 04/16/20 1100  BP: 125/67 133/60  Pulse: 72 82  Resp: 16 16  Temp: 98.3 F (36.8 C) 99 F (37.2 C)  SpO2: 100% 100%   Vitals:   04/16/20 0545 04/16/20 0730 04/16/20 1023 04/16/20 1100  BP: 117/64 121/68 125/67  133/60  Pulse:   72 82  Resp: 12 17 16 16   Temp:   98.3 F (36.8 C) 99 F (37.2 C)  TempSrc:   Oral Oral  SpO2:   100% 100%  Weight:      Height:        General: Pt is alert, awake, not in acute distress Cardiovascular: RRR, S1/S2 +, no rubs, no gallops Respiratory: CTA bilaterally, no wheezing, no rhonchi Abdominal: Soft, NT, ND, bowel sounds + Extremities: Left middle finger cyanosis which is improved with nitroglycerin.  Discharge Instructions  Discharge Instructions    Discharge patient   Complete by: As directed    Discharge disposition: 01-Home or Self Care   Discharge patient date: 04/16/2020     Allergies as of 04/16/2020   No Known Allergies     Medication List    TAKE these medications   amLODipine 10 MG tablet Commonly known as: NORVASC Take 1 tablet (10 mg total) by mouth daily.   aspirin EC 81 MG tablet Take 1 tablet (81 mg total) by mouth daily. Swallow whole.   Calcium-Vitamin D3 500-400 MG-UNIT Tabs Take 1 tablet by mouth 3 (three) times daily.   ferrous gluconate 240 (27 FE) MG tablet Commonly known as: [employer] Iron Take 1 tablet (240 mg total) by mouth 3 (three) times daily with meals.   FLUoxetine 10 MG capsule Commonly known as: PROZAC Take 1 capsule (10 mg total) by mouth daily.   gabapentin 600 MG tablet Commonly known as: NEURONTIN Take 1.5 tablets (900 mg total) by mouth at bedtime.   hydrOXYzine 10 MG tablet Commonly known as: ATARAX/VISTARIL Take 1 tablet (  10 mg total) by mouth every 8 (eight) hours as needed.   losartan 100 MG tablet Commonly known as: COZAAR Take 1 tablet (100 mg total) by mouth daily.   MULTIVITAMIN ADULTS PO Take 1 tablet by mouth daily.   nitroGLYCERIN 2 % ointment Commonly known as: NITROGLYN Apply 0.5 inches topically every 6 (six) hours.   Nurtec 75 MG Tbdp Generic drug: Rimegepant Sulfate Take 1 tablet by mouth daily as needed.   nystatin powder Commonly known as: nystatin Apply topically 4  (four) times daily.   omeprazole 20 MG capsule Commonly known as: PRILOSEC Take 1 capsule (20 mg total) by mouth 2 (two) times daily.   ondansetron 4 MG disintegrating tablet Commonly known as: Zofran ODT Take 1 tablet (4 mg total) by mouth every 8 (eight) hours as needed for nausea or vomiting.   Ozempic (1 MG/DOSE) 2 MG/1.5ML Sopn Generic drug: Semaglutide (1 MG/DOSE) Inject 0.75 mLs (1 mg total) into the skin once a week.   pravastatin 20 MG tablet Commonly known as: PRAVACHOL Take 1 tablet (20 mg total) by mouth daily.   triamcinolone cream 0.1 % Commonly known as: KENALOG Apply topically 2 (two) times daily.   vitamin B-12 500 MCG tablet Commonly known as: CYANOCOBALAMIN Take 1 mcg by mouth every morning.   zolpidem 10 MG tablet Commonly known as: AMBIEN Take 1 tablet (10 mg total) by mouth at bedtime.       Follow-up Information    Steele Sizer, MD. Schedule an appointment as soon as possible for a visit in 1 day(s).   Specialty: Family Medicine Contact information: [address] [place] [address] [phone]              No Known Allergies  You were cared for by a hospitalist during your hospital stay. If you have any questions about your discharge medications or the care you received while you were in the hospital after you are discharged, you can call the unit and asked to speak with the hospitalist on call if the hospitalist that took care of you is not available. Once you are discharged, your primary care physician will handle any further medical issues. Please note that no refills for any discharge medications will be authorized once you are discharged, as it is imperative that you return to your primary care physician (or establish a relationship with a primary care physician if you do not have one) for your aftercare needs so that they can reassess your need for medications and monitor your lab values.   Procedures/Studies: MR ANGIO HEAD  WO CONTRAST  Result Date: 04/16/2020 CLINICAL DATA:  49 year old female with code stroke presentation yesterday. Altered mental status and weakness. EXAM: MRA HEAD WITHOUT CONTRAST TECHNIQUE: Angiographic images of the Circle of Willis were obtained using MRA technique without intravenous contrast. COMPARISON:  Brain MRI today reported separately. Head CT yesterday. FINDINGS: Metal susceptibility artifact from the dental braces also affects this exam, although the study remains diagnostic. There is antegrade flow in the posterior circulation with codominant appearing distal vertebral arteries. Patent PICA origins. Patent vertebrobasilar junction and basilar artery. Patent SCA and PCA origins. A right posterior communicating artery is present with a mildly tortuous right P1. The left posterior communicating artery appears diminutive or absent. Bilateral PCA branches are within normal limits. Antegrade flow in both ICA siphons. No definite siphon stenosis. Normal right posterior communicating artery origin. Patent carotid termini. Patent MCA and ACA origins. The right A1 appears dominant, the left diminutive.  Some of the ACA branch detail is degraded by susceptibility artifact, but visible ACA branches appear within normal limits. Bilateral MCA M1 segments and MCA bifurcations are patent with mild tortuosity. Visible bilateral MCA branches are within normal limits. IMPRESSION: Negative intracranial MRA, when allowing for susceptibility artifact from dental braces. Electronically Signed   By: Genevie Ann M.D.   On: 04/16/2020 09:09   MR BRAIN WO CONTRAST  Result Date: 04/16/2020 CLINICAL DATA:  49 year old female with code stroke presentation yesterday. Altered mental status and weakness. EXAM: MRI HEAD WITHOUT CONTRAST TECHNIQUE: Multiplanar, multiecho pulse sequences of the brain and surrounding structures were obtained without intravenous contrast. COMPARISON:  Plain head CT yesterday. FINDINGS: Brain: Metal  susceptibility artifact likely from dental braces hardware adversely affects portions of the study, especially DWI and T2 * imaging, despite attempts to minimize the artifact. No restricted diffusion is evident. No midline shift, mass effect, or evidence of intracranial mass lesion. No ventriculomegaly. No encephalomalacia is evident. No chronic cerebral blood products are evident. Allowing for susceptibility artifact gray and white matter signal appears within normal limits for age throughout the brain. Negative pituitary and cervicomedullary junction. Vascular: Major intracranial vascular flow voids are preserved. Skull and upper cervical spine: Negative visible cervical spine and bone marrow signal. Sinuses/Orbits: Detail of the orbits and paranasal sinuses is largely obscured. But visible portions are within normal limits. Other: Mastoids are clear. Grossly normal visible internal auditory structures. IMPRESSION: No acute intracranial abnormality is evident. Allowing for significant artifact from dental braces, the brain appears within normal limits for age. Electronically Signed   By: Genevie Ann M.D.   On: 04/16/2020 09:06   US Carotid Bilateral (at [facility] and [facility] only)  Result Date: 04/16/2020 CLINICAL DATA:  Left-sided paresthesias, hypertension, stroke symptoms, diabetes EXAM: BILATERAL CAROTID DUPLEX ULTRASOUND TECHNIQUE: Pearline Cables scale imaging, color Doppler and duplex ultrasound were performed of bilateral carotid and vertebral arteries in the neck. COMPARISON:  None. FINDINGS: Criteria: Quantification of carotid stenosis is based on velocity parameters that correlate the residual internal carotid diameter with NASCET-based stenosis levels, using the diameter of the distal internal carotid lumen as the denominator for stenosis measurement. The following velocity measurements were obtained: RIGHT ICA: 98/40 cm/sec CCA: 811/91 cm/sec SYSTOLIC ICA/CCA RATIO:  1.0 ECA: 104 cm/sec LEFT ICA: 108/38 cm/sec CCA: 47/82  cm/sec SYSTOLIC ICA/CCA RATIO:  1.1 ECA: 87 cm/sec RIGHT CAROTID ARTERY: Minor intimal thickening and very minimal hypoechoic plaque formation. Despite this, no hemodynamically significant right ICA stenosis, velocity elevation, turbulent flow. Degree of narrowing estimated less than 50% by ultrasound criteria. RIGHT VERTEBRAL ARTERY:  Normal antegrade flow LEFT CAROTID ARTERY: Similar minor intimal thickening and minimal hypoechoic plaque formation. No hemodynamically significant left ICA stenosis, velocity elevation, turbulent flow. Degree of narrowing also less than 50% by ultrasound criteria. LEFT VERTEBRAL ARTERY:  Normal antegrade flow IMPRESSION: Minor carotid atherosclerosis and intimal thickening. No hemodynamically significant ICA stenosis. Degree of narrowing less than 50% bilaterally by ultrasound criteria. Patent normal antegrade vertebral flow bilaterally Electronically Signed   By: Jerilynn Mages.  Shick M.D.   On: 04/16/2020 11:41   ECHOCARDIOGRAM COMPLETE  Result Date: 04/16/2020    ECHOCARDIOGRAM REPORT   Patient Name:   Shannon Soto Date of Exam: 04/16/2020 Medical Rec #:  [id number]         Height:       63.0 in Accession #:    [id number]        Weight:       134.8 lb  Date of Birth:  08/25/1970          BSA:          1.635 m Patient Age:    20 years          BP:           117/64 mmHg Patient Gender: F                 HR:           79 bpm. Exam Location:  [facility] Procedure: 2D Echo Indications:    Stroke 434.91 / I163.9  History:        Patient has no prior history of Echocardiogram examinations.                 Risk Factors:Non-Smoker, Dyslipidemia and Diabetes. Raynaud's                 phenomenon, Numbness and tingling in left hand.  Sonographer:    Leavy Cella RDCS (AE) Referring Phys: [id number] ASIA B Pelzer  1. Left ventricular ejection fraction, by estimation, is 65 to 70%. The left ventricle has normal function. The left ventricle has no regional wall motion abnormalities.  Left ventricular diastolic parameters were normal.  2. Right ventricular systolic function is normal. The right ventricular size is normal. There is mildly elevated pulmonary artery systolic pressure.  3. The mitral valve is normal in structure. No evidence of mitral valve regurgitation. No evidence of mitral stenosis.  4. The aortic valve is tricuspid. Aortic valve regurgitation is not visualized. No aortic stenosis is present.  5. The inferior vena cava is normal in size with greater than 50% respiratory variability, suggesting right atrial pressure of 3 mmHg. FINDINGS  Left Ventricle: Left ventricular ejection fraction, by estimation, is 65 to 70%. The left ventricle has normal function. The left ventricle has no regional wall motion abnormalities. The left ventricular internal cavity size was normal in size. There is  no left ventricular hypertrophy. Left ventricular diastolic parameters were normal. Right Ventricle: The right ventricular size is normal. No increase in right ventricular wall thickness. Right ventricular systolic function is normal. There is mildly elevated pulmonary artery systolic pressure. The tricuspid regurgitant velocity is 2.66  m/s, and with an assumed right atrial pressure of 10 mmHg, the estimated right ventricular systolic pressure is 66.2 mmHg. Left Atrium: Left atrial size was normal in size. Right Atrium: Right atrial size was normal in size. Pericardium: There is no evidence of pericardial effusion. Mitral Valve: The mitral valve is normal in structure. No evidence of mitral valve regurgitation. No evidence of mitral valve stenosis. Tricuspid Valve: The tricuspid valve is normal in structure. Tricuspid valve regurgitation is mild . No evidence of tricuspid stenosis. Aortic Valve: The aortic valve is tricuspid. Aortic valve regurgitation is not visualized. No aortic stenosis is present. Aortic valve mean gradient measures 3.7 mmHg. Aortic valve peak gradient measures 8.6 mmHg.  Aortic valve area, by VTI measures 2.19 cm. Pulmonic Valve: The pulmonic valve was not well visualized. Pulmonic valve regurgitation is mild. No evidence of pulmonic stenosis. Aorta: The aortic root is normal in size and structure. Pulmonary Artery: Indeterminant PASP, inadequate TR jet. Venous: The inferior vena cava is normal in size with greater than 50% respiratory variability, suggesting right atrial pressure of 3 mmHg. IAS/Shunts: The interatrial septum was not well visualized.  LEFT VENTRICLE PLAX 2D LVOT diam:     1.80 cm  Diastology LV SV:  58       LV e' lateral:   12.70 cm/s LV SV Index:   38       LV E/e' lateral: 7.5 LVOT Area:     2.54 cm LV e' medial:    9.03 cm/s                         LV E/e' medial:  10.5  RIGHT VENTRICLE RV S prime:     13.40 cm/s TAPSE (M-mode): 2.2 cm LEFT ATRIUM             Index       RIGHT ATRIUM          Index LA Vol (A2C):   28.0 ml 17.12 ml/m RA Area:     8.86 cm LA Vol (A4C):   23.0 ml 14.07 ml/m RA Volume:   18.50 ml 11.31 ml/m LA Biplane Vol: 26.5 ml 16.21 ml/m  AORTIC VALVE AV Area (Vmax):    2.23 cm AV Area (Vmean):   2.26 cm AV Area (VTI):     2.19 cm AV Vmax:           146.81 cm/s AV Vmean:          87.759 cm/s AV VTI:            0.287 m AV Peak Grad:      8.6 mmHg AV Mean Grad:      3.7 mmHg LVOT Vmax:         128.54 cm/s LVOT Vmean:        78.025 cm/s LVOT VTI:          0.247 m LVOT/AV VTI ratio: 0.86  AORTA Ao Root diam: 2.70 cm MITRAL VALVE               TRICUSPID VALVE MV Area (PHT): 3.02 cm    TR Peak grad:   28.3 mmHg MV Decel Time: 251 msec    TR Vmax:        266.00 cm/s MV E velocity: 95.10 cm/s MV A velocity: 82.70 cm/s  SHUNTS MV E/A ratio:  1.15        Systemic VTI:  0.25 m                            Systemic Diam: 1.80 cm Carlyle Dolly MD Electronically signed by Carlyle Dolly MD Signature Date/Time: 04/16/2020/12:09:55 PM    Final    CT HEAD CODE STROKE WO CONTRAST  Result Date: 04/15/2020 CLINICAL DATA:  Code stroke.  Acute  neuro deficit. EXAM: CT HEAD WITHOUT CONTRAST TECHNIQUE: Contiguous axial images were obtained from the base of the skull through the vertex without intravenous contrast. COMPARISON:  None. FINDINGS: Brain: No evidence of acute infarction, hemorrhage, hydrocephalus, extra-axial collection or mass lesion/mass effect. Vascular: Negative for hyperdense vessel Skull: Negative Sinuses/Orbits: Mild mucosal edema paranasal sinuses. Bilateral cataract extraction. Other: None ASPECTS (Valentine Stroke Program Early CT Score) - Ganglionic level infarction (caudate, lentiform nuclei, internal capsule, insula, M1-M3 cortex): 7 - Supraganglionic infarction (M4-M6 cortex): 3 Total score (0-10 with 10 being normal): 10 IMPRESSION: 1. Negative CT head 2. ASPECTS is 10 3. These results were called by telephone at the time of interpretation on 04/15/2020 at 9:40 pm to provider [facility] ZAMMIT , who verbally acknowledged these results. Electronically Signed   By: Franchot Gallo M.D.   On: 04/15/2020 21:41  The results of significant diagnostics from this hospitalization (including imaging, microbiology, ancillary and laboratory) are listed below for reference.     Microbiology: Recent Results (from the past 240 hour(s))  SARS Coronavirus 2 by RT PCR (hospital order, performed in [facility] hospital lab) Nasopharyngeal Nasopharyngeal Swab     Status: None   Collection Time: 04/15/20  9:31 PM   Specimen: Nasopharyngeal Swab  Result Value Ref Range Status   SARS Coronavirus 2 NEGATIVE NEGATIVE Final    Comment: (NOTE) SARS-CoV-2 target nucleic acids are NOT DETECTED.  The SARS-CoV-2 RNA is generally detectable in upper and lower respiratory specimens during the acute phase of infection. The lowest concentration of SARS-CoV-2 viral copies this assay can detect is 250 copies / mL. A negative result does not preclude SARS-CoV-2 infection and should not be used as the sole basis for treatment or other patient  management decisions.  A negative result may occur with improper specimen collection / handling, submission of specimen other than nasopharyngeal swab, presence of viral mutation(s) within the areas targeted by this assay, and inadequate number of viral copies (<250 copies / mL). A negative result must be combined with clinical observations, patient history, and epidemiological information.  Fact Sheet for Patients:   StrictlyIdeas.no  Fact Sheet for Healthcare Providers: BankingDealers.co.za  This test is not yet approved or  cleared by the [place] FDA and has been authorized for detection and/or diagnosis of SARS-CoV-2 by FDA under an Emergency Use Authorization (EUA).  This EUA will remain in effect (meaning this test can be used) for the duration of the COVID-19 declaration under Section 564(b)(1) of the Act, 21 U.S.C. section 360bbb-3(b)(1), unless the authorization is terminated or revoked sooner.  Performed at [facility], [address] [place], [place] [address]      Labs: BNP (last 3 results) No results for input(s): BNP in the last 8760 hours. Basic Metabolic Panel: Recent Labs  Lab 04/15/20 2153 04/16/20 0426  NA 140 139  K 3.6 3.7  CL 106 107  CO2 25 23  GLUCOSE 104* 100*  BUN 19 14  CREATININE 0.74 0.54  CALCIUM 8.9 8.5*  MG  --  2.1  PHOS  --  2.5   Liver Function Tests: Recent Labs  Lab 04/15/20 2153 04/16/20 0426  AST 18 13*  ALT 16 14  ALKPHOS 84 70  BILITOT 0.2* 0.4  PROT 7.7 6.6  ALBUMIN 4.6 3.9   No results for input(s): LIPASE, AMYLASE in the last 168 hours. No results for input(s): AMMONIA in the last 168 hours. CBC: Recent Labs  Lab 04/15/20 2153 04/16/20 0426  WBC 9.6 9.8  NEUTROABS 6.1  --   HGB 12.3 10.9*  HCT 42.3 36.8  MCV 88.3 84.4  PLT 418* 403*   Cardiac Enzymes: No results for input(s): CKTOTAL, CKMB, CKMBINDEX, TROPONINI in the last 168 hours. BNP: Invalid  input(s): POCBNP CBG: Recent Labs  Lab 04/15/20 2146 04/16/20 0816 04/16/20 1154  GLUCAP 63* 92 93   D-Dimer No results for input(s): DDIMER in the last 72 hours. Hgb A1c No results for input(s): HGBA1C in the last 72 hours. Lipid Profile Recent Labs    04/16/20 0426  CHOL 141  HDL 50  LDLCALC 86  TRIG 24  CHOLHDL 2.8   Thyroid function studies Recent Labs    04/15/20 2153  TSH 2.697   Anemia work up Recent Labs    04/15/20 2153 04/16/20 0136  VITAMINB12 518  --   FOLATE  --  12.0  FERRITIN 87  --    Urinalysis    Component Value Date/Time   COLORURINE YELLOW 08/27/2014 0911   APPEARANCEUR CLEAR 08/27/2014 0911   LABSPEC 1.010 08/27/2014 0911   PHURINE 5.5 08/27/2014 0911   GLUCOSEU NEG 08/27/2014 0911   HGBUR NEG 08/27/2014 0911   BILIRUBINUR neg 01/18/2018 1350   KETONESUR NEG 08/27/2014 0911   PROTEINUR Negative 01/18/2018 1350   PROTEINUR NEG 08/27/2014 0911   UROBILINOGEN 0.2 01/18/2018 1350   UROBILINOGEN 0.2 08/27/2014 0911   NITRITE neg 01/18/2018 1350   NITRITE NEG 08/27/2014 0911   LEUKOCYTESUR Negative 01/18/2018 1350   Sepsis Labs Invalid input(s): PROCALCITONIN,  WBC,  LACTICIDVEN Microbiology Recent Results (from the past 240 hour(s))  SARS Coronavirus 2 by RT PCR (hospital order, performed in [facility] hospital lab) Nasopharyngeal Nasopharyngeal Swab     Status: None   Collection Time: 04/15/20  9:31 PM   Specimen: Nasopharyngeal Swab  Result Value Ref Range Status   SARS Coronavirus 2 NEGATIVE NEGATIVE Final    Comment: (NOTE) SARS-CoV-2 target nucleic acids are NOT DETECTED.  The SARS-CoV-2 RNA is generally detectable in upper and lower respiratory specimens during the acute phase of infection. The lowest concentration of SARS-CoV-2 viral copies this assay can detect is 250 copies / mL. A negative result does not preclude SARS-CoV-2 infection and should not be used as the sole basis for treatment or other patient management  decisions.  A negative result may occur with improper specimen collection / handling, submission of specimen other than nasopharyngeal swab, presence of viral mutation(s) within the areas targeted by this assay, and inadequate number of viral copies (<250 copies / mL). A negative result must be combined with clinical observations, patient history, and epidemiological information.  Fact Sheet for Patients:   StrictlyIdeas.no  Fact Sheet for Healthcare Providers: BankingDealers.co.za  This test is not yet approved or  cleared by the [place] FDA and has been authorized for detection and/or diagnosis of SARS-CoV-2 by FDA under an Emergency Use Authorization (EUA).  This EUA will remain in effect (meaning this test can be used) for the duration of the COVID-19 declaration under Section 564(b)(1) of the Act, 21 U.S.C. section 360bbb-3(b)(1), unless the authorization is terminated or revoked sooner.  Performed at [facility], [address], [place], [place] [address]      Time coordinating discharge:  I have spent 35 minutes face to face with the patient and on the ward discussing the patients care, assessment, plan and disposition with other care givers. >50% of the time was devoted counseling the patient about the risks and benefits of treatment/Discharge disposition and coordinating care.   SIGNED:   Damita Lack, MD  [facility] 04/16/2020, 3:14 PM   If 7PM-7AM, please contact night-coverage

## 2020-04-16 NOTE — H&P (Signed)
TRH H&P    Patient Demographics:    Shannon Soto, is a 49 y.o. female  MRN: [id number]  DOB - 1971/01/29  Admit Date - 04/15/2020  Referring MD/NP/PA: Roderic Palau  Outpatient Primary MD for the patient is Steele Sizer, MD  Patient coming from: Home  Chief complaint- Left hand tingling   HPI:    Shannon Soto  is a 49 y.o. female, with history of Raynaud's, migraines, HLD, GERD, DMII, and gastric bypass presents to the ER with a chief complaint of left hand tingling. Patient reports that the paresthesias started at 7:45Pm while she was preparing herself a bath. Within a few minutes the paresthesias had radiated up her left arm, and started in the upper part of the left leg. Patient reports that she also noticed a blue color to her middle finger. She has had Raynaud's for years, and doesn't think that this feels like her normal Raynaud's. Patient had no chest pain, palpitations, change in vision, change in hearing, difficulty with speech, difficulty with swallowing, difficulty with ambulation, or facial droop. She waited for 90 minutes, and when the symptoms wouldn't resolve she had her aunt drive her to the ER. In the ER - teleneuro reported that this is more likely complex migraine or Raynaud's than TIA, but recommended TIA workup. At the time of my exam, symptoms are almost gone, and she reports that they were entirely gone before reglan was administered. At the time that reglan was administered, patient started having chest pain, palpitations, and again with paresthesias. These symptoms were improved at the time of my exam.   In the ED T 97.9, P 114, R27, BP 133/83, 100% WBC 9.6 Plt 418 Chem panel is unremarkable CT head = Negative Headache cocktail with reglan, benadryl and toradol    Review of systems:    In addition to the HPI above,  Review of Systems  Constitutional: Negative for chills, fever and  malaise/fatigue.  HENT: Negative for congestion, ear pain, hearing loss, sinus pain, sore throat and tinnitus.   Eyes: Negative for blurred vision and double vision.  Respiratory: Positive for shortness of breath. Negative for cough and sputum production.   Cardiovascular: Positive for chest pain and palpitations. Negative for leg swelling.  Gastrointestinal: Negative for abdominal pain, constipation, diarrhea, nausea and vomiting.  Genitourinary: Negative for dysuria, frequency and urgency.  Musculoskeletal: Negative for myalgias.  Skin: Negative for itching and rash.  Neurological: Positive for tingling. Negative for dizziness, focal weakness and weakness.  Psychiatric/Behavioral: Negative for substance abuse.     All other systems reviewed and are negative.    Past History of the following :    Past Medical History:  Diagnosis Date  . Diabetes mellitus without complication ([place])    improved after gastric sleeve  . GERD (gastroesophageal reflux disease)   . Hyperlipidemia   . Hypertension   . Migraine headache    2x/mo  . Orthodontics    braces  . Raynaud's syndrome       Past Surgical History:  Procedure Laterality Date  .  ABDOMINAL HYSTERECTOMY    . APPENDECTOMY    . BREAST SURGERY    . BREATH TEK H PYLORI N/A 08/27/2014   Procedure: BREATH TEK H PYLORI;  Surgeon: Pedro Earls, MD;  Location: [facility];  Service: General;  Laterality: N/A;  . CATARACT EXTRACTION W/PHACO Left 09/20/2019   Procedure: CATARACT EXTRACTION PHACO AND INTRAOCULAR LENS PLACEMENT (IOC) LEFT 0.74 00:12.3 6.0%;  Surgeon: Leandrew Koyanagi, MD;  Location: [facility];  Service: Ophthalmology;  Laterality: Left;  . CATARACT EXTRACTION W/PHACO Right 10/11/2019   Procedure: CATARACT EXTRACTION PHACO AND INTRAOCULAR LENS PLACEMENT (IOC)  RIGHT 1.55 00:24.1 6.4%;  Surgeon: Leandrew Koyanagi, MD;  Location: [facility];  Service: Ophthalmology;  Laterality: Right;  diabetic  .  COLONOSCOPY WITH PROPOFOL N/A 02/17/2019   Procedure: COLONOSCOPY WITH PROPOFOL;  Surgeon: Lucilla Lame, MD;  Location: [facility];  Service: Endoscopy;  Laterality: N/A;  . HIATAL HERNIA REPAIR  11/05/2014   Procedure: LAPAROSCOPIC REPAIR OF HIATAL HERNIA;  Surgeon: Johnathan Hausen, MD;  Location: [facility];  Service: General;;  . LAPAROSCOPIC GASTRIC SLEEVE RESECTION N/A 11/05/2014   Procedure: LAPAROSCOPIC GASTRIC SLEEVE RESECTION;  Surgeon: Johnathan Hausen, MD;  Location: [facility];  Service: General;  Laterality: N/A;  . POLYPECTOMY  02/17/2019   Procedure: POLYPECTOMY;  Surgeon: Lucilla Lame, MD;  Location: [facility];  Service: Endoscopy;;  . REDUCTION MAMMAPLASTY Bilateral 1997  . UPPER GI ENDOSCOPY  11/05/2014   Procedure: UPPER GI ENDOSCOPY;  Surgeon: Johnathan Hausen, MD;  Location: [facility];  Service: General;;      Social History:      Social History   Tobacco Use  . Smoking status: Never Smoker  . Smokeless tobacco: Never Used  Substance Use Topics  . Alcohol use: No    Alcohol/week: 0.0 standard drinks    Comment: occasional        Family History :     Family History  Problem Relation Age of Onset  . Diabetes Paternal Uncle   . Stroke Father   . Hypertension Other   . Breast cancer Maternal Grandmother 72   reviewed   Home Medications:   Prior to Admission medications   Medication Sig Start Date End Date Taking? Authorizing Provider  amLODipine (NORVASC) 10 MG tablet Take 1 tablet (10 mg total) by mouth daily. 10/03/19   Steele Sizer, MD  Calcium Carb-Cholecalciferol (CALCIUM-VITAMIN D3) 500-400 MG-UNIT TABS Take 1 tablet by mouth 3 (three) times daily.    [provider]  cyanocobalamin 500 MCG tablet Take 1 mcg by mouth every morning.    [provider]  ferrous gluconate ([facility] IRON) 240 (27 FE) MG tablet Take 1 tablet (240 mg total) by mouth 3 (three) times daily with meals. 02/28/15   Steele Sizer, MD  FLUoxetine (PROZAC) 10 MG  capsule Take 1 capsule (10 mg total) by mouth daily. 02/05/20   Steele Sizer, MD  gabapentin (NEURONTIN) 600 MG tablet Take 1.5 tablets (900 mg total) by mouth at bedtime. 02/08/20   Steele Sizer, MD  hydrOXYzine (ATARAX/VISTARIL) 10 MG tablet Take 1 tablet (10 mg total) by mouth every 8 (eight) hours as needed. 02/08/20   Steele Sizer, MD  losartan (COZAAR) 100 MG tablet Take 1 tablet (100 mg total) by mouth daily. 02/05/20   Steele Sizer, MD  Multiple Vitamins-Minerals (MULTIVITAMIN ADULTS PO) Take 1 tablet by mouth daily. 08/22/19   [provider]  nystatin (NYSTATIN) powder Apply topically 4 (four) times daily. 05/13/18   Sowles,  Drue Stager, MD  omeprazole (PRILOSEC) 20 MG capsule Take 1 capsule (20 mg total) by mouth 2 (two) times daily. 02/08/20   Steele Sizer, MD  ondansetron (ZOFRAN ODT) 4 MG disintegrating tablet Take 1 tablet (4 mg total) by mouth every 8 (eight) hours as needed for nausea or vomiting. 02/08/20   Steele Sizer, MD  pravastatin (PRAVACHOL) 20 MG tablet Take 1 tablet (20 mg total) by mouth daily. 02/05/20   Steele Sizer, MD  Rimegepant Sulfate (NURTEC) 75 MG TBDP Take 1 tablet by mouth daily as needed. 02/08/20   Steele Sizer, MD  Semaglutide, 1 MG/DOSE, (OZEMPIC, 1 MG/DOSE,) 2 MG/1.5ML SOPN Inject 0.75 mLs (1 mg total) into the skin once a week. 02/08/20   Steele Sizer, MD  triamcinolone cream (KENALOG) 0.1 % Apply topically 2 (two) times daily. 10/03/19   Steele Sizer, MD  zolpidem (AMBIEN) 10 MG tablet Take 1 tablet (10 mg total) by mouth at bedtime. 02/08/20   Steele Sizer, MD     Allergies:    No Known Allergies   Physical Exam:   Vitals  Blood pressure 133/83, pulse 86, temperature 97.9 F (36.6 C), temperature source Oral, resp. rate (!) 27, height 5\' 3"  (1.6 m), weight 61.1 kg, SpO2 100 %.  1.  General: Sitting up in bed, appearing anxious  2. Psychiatric: Very little insight into own healthcare Cooperative with exam  3.  Neurologic: CN II-XII intact Moves all 4 extremities voluntarily Hypersensitivity to left hand No change in sensation on face  4. HEENMT:  Head is atraumatic normocephalic Neck is supple Trachea is midline Mucous membranes moist  5. Respiratory : LCTABL  6. Cardiovascular : Heart rate tachycardic, rhythm regular No murmur, rub, or gallop  7. Gastrointestinal:  Abd soft, nondistended, non tender to palpation  8. Skin:  Vitiligo present No acute lesions  9.Musculoskeletal:  Cyanotic hue to left third digit, barely noticeable Radial pulses normal BL Pedal pulses normal    Data Review:    CBC Recent Labs  Lab 04/15/20 2153  WBC 9.6  HGB 12.3  HCT 42.3  PLT 418*  MCV 88.3  MCH 25.7*  MCHC 29.1*  RDW 15.0  LYMPHSABS 2.6  MONOABS 0.7  EOSABS 0.1  BASOSABS 0.1   ------------------------------------------------------------------------------------------------------------------  Results for orders placed or performed during the hospital encounter of 04/15/20 (from the past 48 hour(s))  SARS Coronavirus 2 by RT PCR (hospital order, performed in [facility] hospital lab) Nasopharyngeal Nasopharyngeal Swab     Status: None   Collection Time: 04/15/20  9:31 PM   Specimen: Nasopharyngeal Swab  Result Value Ref Range   SARS Coronavirus 2 NEGATIVE NEGATIVE    Comment: (NOTE) SARS-CoV-2 target nucleic acids are NOT DETECTED.  The SARS-CoV-2 RNA is generally detectable in upper and lower respiratory specimens during the acute phase of infection. The lowest concentration of SARS-CoV-2 viral copies this assay can detect is 250 copies / mL. A negative result does not preclude SARS-CoV-2 infection and should not be used as the sole basis for treatment or other patient management decisions.  A negative result may occur with improper specimen collection / handling, submission of specimen other than nasopharyngeal swab, presence of viral mutation(s) within the areas  targeted by this assay, and inadequate number of viral copies (<250 copies / mL). A negative result must be combined with clinical observations, patient history, and epidemiological information.  Fact Sheet for Patients:   StrictlyIdeas.no  Fact Sheet for Healthcare Providers: BankingDealers.co.za  This test is not  yet approved or  cleared by the [place] and has been authorized for detection and/or diagnosis of SARS-CoV-2 by FDA under an Emergency Use Authorization (EUA).  This EUA will remain in effect (meaning this test can be used) for the duration of the COVID-19 declaration under Section 564(b)(1) of the Act, 21 U.S.C. section 360bbb-3(b)(1), unless the authorization is terminated or revoked sooner.  Performed at [facility], [address] [place], [place] [address]   CBG monitoring, ED     Status: Abnormal   Collection Time: 04/15/20  9:46 PM  Result Value Ref Range   Glucose-Capillary 63 (L) 70 - 99 mg/dL    Comment: Glucose reference range applies only to samples taken after fasting for at least 8 hours.  Ethanol     Status: None   Collection Time: 04/15/20  9:53 PM  Result Value Ref Range   Alcohol, Ethyl (B) <10 <10 mg/dL    Comment: (NOTE) Lowest detectable limit for serum alcohol is 10 mg/dL.  For medical purposes only. Performed at [facility], [address], [place], [place] [address]   Protime-INR     Status: None   Collection Time: 04/15/20  9:53 PM  Result Value Ref Range   Prothrombin Time 12.0 11.4 - 15.2 seconds   INR 0.9 0.8 - 1.2    Comment: (NOTE) INR goal varies based on device and disease states. Performed at [facility], [address] [place], [place] [address]   APTT     Status: None   Collection Time: 04/15/20  9:53 PM  Result Value Ref Range   aPTT 31 24 - 36 seconds    Comment: Performed at [facility], [address] [place], [place] [address]  CBC     Status: Abnormal    Collection Time: 04/15/20  9:53 PM  Result Value Ref Range   WBC 9.6 4.0 - 10.5 K/uL   RBC 4.79 3.87 - 5.11 MIL/uL   Hemoglobin 12.3 12.0 - 15.0 g/dL   HCT 42.3 36 - 46 %   MCV 88.3 80.0 - 100.0 fL   MCH 25.7 (L) 26.0 - 34.0 pg   MCHC 29.1 (L) 30.0 - 36.0 g/dL   RDW 15.0 11.5 - 15.5 %   Platelets 418 (H) 150 - 400 K/uL   nRBC 0.0 0.0 - 0.2 %    Comment: Performed at [facility], [address], [place], [place] [address]  Differential     Status: None   Collection Time: 04/15/20  9:53 PM  Result Value Ref Range   Neutrophils Relative % 63 %   Neutro Abs 6.1 1.7 - 7.7 K/uL   Lymphocytes Relative 28 %   Lymphs Abs 2.6 0.7 - 4.0 K/uL   Monocytes Relative 7 %   Monocytes Absolute 0.7 0 - 1 K/uL   Eosinophils Relative 1 %   Eosinophils Absolute 0.1 0 - 0 K/uL   Basophils Relative 1 %   Basophils Absolute 0.1 0 - 0 K/uL   Immature Granulocytes 0 %   Abs Immature Granulocytes 0.03 0.00 - 0.07 K/uL    Comment: Performed at [facility], [address], [place], [place] [address]  Comprehensive metabolic panel     Status: Abnormal   Collection Time: 04/15/20  9:53 PM  Result Value Ref Range   Sodium 140 135 - 145 mmol/L   Potassium 3.6 3.5 - 5.1 mmol/L   Chloride 106 98 - 111 mmol/L   CO2 25 22 - 32 mmol/L  Glucose, Bld 104 (H) 70 - 99 mg/dL    Comment: Glucose reference range applies only to samples taken after fasting for at least 8 hours.   BUN 19 6 - 20 mg/dL   Creatinine, Ser 0.74 0.44 - 1.00 mg/dL   Calcium 8.9 8.9 - 10.3 mg/dL   Total Protein 7.7 6.5 - 8.1 g/dL   Albumin 4.6 3.5 - 5.0 g/dL   AST 18 15 - 41 U/L   ALT 16 0 - 44 U/L   Alkaline Phosphatase 84 38 - 126 U/L   Total Bilirubin 0.2 (L) 0.3 - 1.2 mg/dL   GFR calc non Af Amer >60 >60 mL/min   GFR calc Af Amer >60 >60 mL/min   Anion gap 9 5 - 15    Comment: Performed at [facility], [address] [place], [place] [address]  Troponin I (High Sensitivity)     Status: None   Collection Time: 04/15/20  9:53 PM    Result Value Ref Range   Troponin I (High Sensitivity) <2 <18 ng/L    Comment: (NOTE) Elevated high sensitivity troponin I (hsTnI) values and significant  changes across serial measurements may suggest ACS but many other  chronic and acute conditions are known to elevate hsTnI results.  Refer to the "Links" section for chest pain algorithms and additional  guidance. Performed at [facility], [address], [place], [place] [address]     Chemistries  Recent Labs  Lab 04/15/20 2153  NA 140  K 3.6  CL 106  CO2 25  GLUCOSE 104*  BUN 19  CREATININE 0.74  CALCIUM 8.9  AST 18  ALT 16  ALKPHOS 84  BILITOT 0.2*   ------------------------------------------------------------------------------------------------------------------  ------------------------------------------------------------------------------------------------------------------ GFR: Estimated Creatinine Clearance: 68.8 mL/min (by C-G formula based on SCr of 0.74 mg/dL). Liver Function Tests: Recent Labs  Lab 04/15/20 2153  AST 18  ALT 16  ALKPHOS 84  BILITOT 0.2*  PROT 7.7  ALBUMIN 4.6   No results for input(s): LIPASE, AMYLASE in the last 168 hours. No results for input(s): AMMONIA in the last 168 hours. Coagulation Profile: Recent Labs  Lab 04/15/20 2153  INR 0.9   Cardiac Enzymes: No results for input(s): CKTOTAL, CKMB, CKMBINDEX, TROPONINI in the last 168 hours. BNP (last 3 results) No results for input(s): PROBNP in the last 8760 hours. HbA1C: No results for input(s): HGBA1C in the last 72 hours. CBG: Recent Labs  Lab 04/15/20 2146  GLUCAP 63*   Lipid Profile: No results for input(s): CHOL, HDL, LDLCALC, TRIG, CHOLHDL, LDLDIRECT in the last 72 hours. Thyroid Function Tests: No results for input(s): TSH, T4TOTAL, FREET4, T3FREE, THYROIDAB in the last 72 hours. Anemia Panel: No results for input(s): VITAMINB12, FOLATE, FERRITIN, TIBC, IRON, RETICCTPCT in the last 72  hours.  --------------------------------------------------------------------------------------------------------------- Urine analysis:    Component Value Date/Time   COLORURINE YELLOW 08/27/2014 0911   APPEARANCEUR CLEAR 08/27/2014 0911   LABSPEC 1.010 08/27/2014 0911   PHURINE 5.5 08/27/2014 0911   GLUCOSEU NEG 08/27/2014 0911   HGBUR NEG 08/27/2014 0911   BILIRUBINUR neg 01/18/2018 1350   KETONESUR NEG 08/27/2014 0911   PROTEINUR Negative 01/18/2018 1350   PROTEINUR NEG 08/27/2014 0911   UROBILINOGEN 0.2 01/18/2018 1350   UROBILINOGEN 0.2 08/27/2014 0911   NITRITE neg 01/18/2018 1350   NITRITE NEG 08/27/2014 0911   LEUKOCYTESUR Negative 01/18/2018 1350      Imaging Results:    CT HEAD CODE STROKE WO CONTRAST  Result Date: 04/15/2020 CLINICAL DATA:  Code stroke.  Acute neuro deficit. EXAM: CT HEAD WITHOUT CONTRAST TECHNIQUE: Contiguous axial images were obtained from the base of the skull through the vertex without intravenous contrast. COMPARISON:  None. FINDINGS: Brain: No evidence of acute infarction, hemorrhage, hydrocephalus, extra-axial collection or mass lesion/mass effect. Vascular: Negative for hyperdense vessel Skull: Negative Sinuses/Orbits: Mild mucosal edema paranasal sinuses. Bilateral cataract extraction. Other: None ASPECTS ([place] Stroke Program Early CT Score) - Ganglionic level infarction (caudate, lentiform nuclei, internal capsule, insula, M1-M3 cortex): 7 - Supraganglionic infarction (M4-M6 cortex): 3 Total score (0-10 with 10 being normal): 10 IMPRESSION: 1. Negative CT head 2. ASPECTS is 10 3. These results were called by telephone at the time of interpretation on 04/15/2020 at 9:40 pm to provider [facility] ZAMMIT , who verbally acknowledged these results. Electronically Signed   By: Franchot Gallo M.D.   On: 04/15/2020 21:41    My personal review of EKG: Rhythm NSR, Rate 81 /min, QTc 471 ,no Acute ST changes   Assessment & Plan:    Active Problems:    Numbness and tingling in left hand   1. Left side tingling  1. With associated blue middle finger 2. History of Raynaud's 3. Tele neuro believed this to be possible complex migraine? 4. Rec'd Stroke workup 5. MRI, Echo, Carotid US tomorrow 6. Passed swallow study - start heart healthy diet 7. Monitor on tele 2. Chest pain 1. Started after Reglan was administered 2. Patient became very anxious with HR up to 120 3. Repeat EKG ordered 4. Trops pending 5. Monitor on tele 6. Most likely 2/2 anxiety PRN ativan ordered 3. Raynaud's syndrome 1. Nitro paste applied to blue finger 2. Warming pack applied to left hand 3. Pulses are normal 4. Continue to monitor 4. Anxiety 1. Continue prozac, atarax, and PRN ativan 5. DMII 1. Last hgb A1C 5.6 2. Patient reports that she is on ozempic because her Hgb A1C "spikes" 3. Sliding scale  4. Continue to monitor 6. History of gastric sleeve 1. Done in 2016 2. Electrolyte/nutritional deficiencies could be contributing to paresthesias 3. Check routine nutrient levels and electrolytes associated with gastric bypass 7. Thrombocytosis 1. Baseline 350s 2. Today plt 418  3. Acute phase reactant 4. Continue to trend with CBC in the AM   DVT Prophylaxis-   heparin - SCDs   AM Labs Ordered, also please review Full Orders  Family Communication: Admission, patients condition and plan of care including tests being ordered have been discussed with the patient and sister who indicate understanding and agree with the plan and Code Status.  Code Status:  Full  Admission status: Observation  Time spent in minutes : [place]

## 2020-04-16 NOTE — Progress Notes (Signed)
*  PRELIMINARY RESULTS* Echocardiogram 2D Echocardiogram has been performed.  Shannon Soto 04/16/2020, 9:23 AM

## 2020-04-23 NOTE — Progress Notes (Addendum)
Name: Shannon Soto   MRN: [id number]    DOB: Apr 19, 1971   Date:04/26/2020       Progress Note  [facility] discharge follow up.  HPI  Admission date: 04/15/2020 Discharge date 04/16/2020   She states symptoms started on 04/15/2020 initially had a headache around noon, in the evening she was getting ready to take a bath and noticed pain and ecchymosis on left middle finger, she states within and hour she noticed left hand and arm was numbness, on her way to [facility] she noticed numbness on left lateral leg also. She was admitted , had multiple tests including MRI brain, carotid doppler that were normal. She states nitroglycerin paste helped with pain and ecchymosis, she states ecchymosis has resolved since the weekend but still goes back and forth, gets pale and purple at times, but not staying purple like it was for over one week. She states numbness on left resolved, but some paresthesia on forearm.  She went home on aspirin, NTG paste and advised to follow up with Rheumatologist and Neurologist ( she made appointment already ).   Elevated bp she is very nervous since she left the hospital, not taking Lorrin Mais because she is afraid of going to sleep, stopped taking Ambien, she states only able to sleep at most 2 hours per night. She has lost weight because of lack of appetite, feeling dizzy at times, she needs time off work and [employer] forms will be filled out today, going to see neurologist next week, and give her time to go back to work     Patient Active Problem List   Diagnosis Date Noted  . Numbness and tingling in left hand 04/16/2020  . Encounter for screening colonoscopy   . Polyp of sigmoid colon   . Controlled type 2 diabetes mellitus with neuropathy ([place]) 05/12/2016  . Raynaud's phenomenon 05/12/2016  . Intertrigo 09/06/2015  . Migraine headache without aura 02/28/2015  . History of obesity 02/28/2015  . Hyperlipidemia 02/28/2015  . GERD  (gastroesophageal reflux disease) 02/28/2015  . Vitamin D deficiency 02/28/2015  . Chronic constipation 02/28/2015  . Chronic insomnia 02/28/2015  . Peripheral neuropathy 02/28/2015  . Vitiligo 02/28/2015  . Lower leg mass 02/28/2015  . Depression with anxiety 02/28/2015  . History of bariatric surgery 11/05/2014    Past Surgical History:  Procedure Laterality Date  . ABDOMINAL HYSTERECTOMY    . APPENDECTOMY    . BREAST SURGERY    . BREATH TEK H PYLORI N/A 08/27/2014   Procedure: BREATH TEK H PYLORI;  Surgeon: Pedro Earls, MD;  Location: [facility];  Service: General;  Laterality: N/A;  . CATARACT EXTRACTION W/PHACO Left 09/20/2019   Procedure: CATARACT EXTRACTION PHACO AND INTRAOCULAR LENS PLACEMENT (IOC) LEFT 0.74 00:12.3 6.0%;  Surgeon: Leandrew Koyanagi, MD;  Location: [facility];  Service: Ophthalmology;  Laterality: Left;  . CATARACT EXTRACTION W/PHACO Right 10/11/2019   Procedure: CATARACT EXTRACTION PHACO AND INTRAOCULAR LENS PLACEMENT (IOC)  RIGHT 1.55 00:24.1 6.4%;  Surgeon: Leandrew Koyanagi, MD;  Location: [facility];  Service: Ophthalmology;  Laterality: Right;  diabetic  . COLONOSCOPY WITH PROPOFOL N/A 02/17/2019   Procedure: COLONOSCOPY WITH PROPOFOL;  Surgeon: Lucilla Lame, MD;  Location: [place];  Service: Endoscopy;  Laterality: N/A;  . HIATAL HERNIA REPAIR  11/05/2014   Procedure: LAPAROSCOPIC REPAIR OF HIATAL HERNIA;  Surgeon: Johnathan Hausen, MD;  Location[facility];  Service: General;;  . LAPAROSCOPIC GASTRIC SLEEVE RESECTION N/A 11/05/2014  Procedure: LAPAROSCOPIC GASTRIC SLEEVE RESECTION;  Surgeon: Johnathan Hausen, MD;  Location[facility];  Service: General;  Laterality: N/A;  . POLYPECTOMY  02/17/2019   Procedure: POLYPECTOMY;  Surgeon: Lucilla Lame, MD;  Location: [place];  Service: Endoscopy;;  . REDUCTION MAMMAPLASTY Bilateral 1997  . UPPER GI ENDOSCOPY  11/05/2014   Procedure: UPPER GI ENDOSCOPY;  Surgeon: Johnathan Hausen, MD;  [facility];  Service: General;;    Family History  Problem Relation Age of Onset  . Diabetes Paternal Uncle   . Stroke Father   . Hypertension Other   . Breast cancer Maternal Grandmother 54    Social History   Tobacco Use  . Smoking status: Never Smoker  . Smokeless tobacco: Never Used  Substance Use Topics  . Alcohol use: No    Alcohol/week: 0.0 standard drinks    Comment: occasional      Current Outpatient Medications:  .  amLODipine (NORVASC) 10 MG tablet, Take 1 tablet (10 mg total) by mouth daily., Disp: 90 tablet, Rfl: 1 .  aspirin EC 81 MG tablet, Take 1 tablet (81 mg total) by mouth daily. Swallow whole., Disp: 30 tablet, Rfl: 0 .  Calcium Carb-Cholecalciferol (CALCIUM-VITAMIN D3) 500-400 MG-UNIT TABS, Take 1 tablet by mouth 3 (three) times daily., Disp: , Rfl:  .  cyanocobalamin 500 MCG tablet, Take 1 mcg by mouth every morning., Disp: , Rfl:  .  ferrous gluconate ([employer] IRON) 240 (27 FE) MG tablet, Take 1 tablet (240 mg total) by mouth 3 (three) times daily with meals., Disp: 30 tablet, Rfl: 0 .  FLUoxetine (PROZAC) 10 MG capsule, Take 1 capsule (10 mg total) by mouth daily., Disp: 90 capsule, Rfl: 1 .  gabapentin (NEURONTIN) 600 MG tablet, Take 1.5 tablets (900 mg total) by mouth at bedtime., Disp: 135 tablet, Rfl: 1 .  hydrOXYzine (ATARAX/VISTARIL) 10 MG tablet, Take 1 tablet (10 mg total) by mouth every 8 (eight) hours as needed., Disp: 90 tablet, Rfl: 0 .  losartan (COZAAR) 100 MG tablet, Take 1 tablet (100 mg total) by mouth daily., Disp: 90 tablet, Rfl: 1 .  Multiple Vitamins-Minerals (MULTIVITAMIN ADULTS PO), Take 1 tablet by mouth daily., Disp: , Rfl:  .  nitroGLYCERIN (NITROGLYN) 2 % ointment, Apply 0.5 inches topically every 6 (six) hours., Disp: 30 g, Rfl: 0 .  nystatin (NYSTATIN) powder, Apply topically 4 (four) times daily., Disp: 60 g, Rfl: 2 .  omeprazole (PRILOSEC) 20 MG capsule, Take 1 capsule (20 mg total) by mouth 2 (two) times  daily., Disp: 180 capsule, Rfl: 1 .  ondansetron (ZOFRAN ODT) 4 MG disintegrating tablet, Take 1 tablet (4 mg total) by mouth every 8 (eight) hours as needed for nausea or vomiting., Disp: 20 tablet, Rfl: 0 .  pravastatin (PRAVACHOL) 20 MG tablet, Take 1 tablet (20 mg total) by mouth daily., Disp: 90 tablet, Rfl: 1 .  Rimegepant Sulfate (NURTEC) 75 MG TBDP, Take 1 tablet by mouth daily as needed., Disp: 8 tablet, Rfl: 1 .  Semaglutide, 1 MG/DOSE, (OZEMPIC, 1 MG/DOSE,) 2 MG/1.5ML SOPN, Inject 0.75 mLs (1 mg total) into the skin once a week., Disp: 3 mL, Rfl: 5 .  triamcinolone cream (KENALOG) 0.1 %, Apply topically 2 (two) times daily., Disp: 453.9 g, Rfl: 0 .  zolpidem (AMBIEN) 10 MG tablet, Take 1 tablet (10 mg total) by mouth at bedtime., Disp: 90 tablet, Rfl: 0  No Known Allergies  I personally reviewed active problem list, medication list, allergies, family history, social history,  health maintenance with the patient/caregiver today.   ROS  Constitutional: Negative for fever, positive for weight change.  Respiratory: Negative for cough and shortness of breath.   Cardiovascular: Negative for chest pain , positive for  palpitations.  Gastrointestinal: Negative for abdominal pain, no bowel changes.  Musculoskeletal: Negative for gait problem or joint swelling.  Skin: Negative for rash.  Neurological: Negative for dizziness, positive for intermittent  headache.  No other specific complaints in a complete review of systems (except as listed in HPI above).  Objective  Vitals:   04/25/20 1321  BP: (!) 150/90  Pulse: (!) 111  Resp: 16  Temp: 98.5 F (36.9 C)  TempSrc: Oral  SpO2: 95%  Weight: 131 lb (59.4 kg)  Height: 5\' 2"  (1.575 m)    Body mass index is 23.96 kg/m.  Physical Exam  Constitutional: Patient appears well-developed and well-nourished. No distress.  HEENT: head atraumatic, normocephalic, pupils equal and reactive to light,  neck supple Cardiovascular: Normal  rate, regular rhythm and normal heart sounds.  No murmur heard. No BLE edema. Pulmonary/Chest: Effort normal and breath sounds normal. No respiratory distress. Abdominal: Soft.  There is no tenderness. Skin: vitiligo, left hand normal exam, no ecchymosis  Psychiatric: Patient has a normal mood and affect. behavior is normal. Judgment and thought content normal.   Recent Results (from the past 2160 hour(s))  POCT glycosylated hemoglobin (Hb A1C)     Status: None   Collection Time: 02/08/20  1:35 PM  Result Value Ref Range   Hemoglobin A1C 5.6 4.0 - 5.6 %   HbA1c POC (<> result, manual entry)     HbA1c, POC (prediabetic range)     HbA1c, POC (controlled diabetic range)    SARS Coronavirus 2 by RT PCR (hospital order, performed in [facility] hospital lab) Nasopharyngeal Nasopharyngeal Swab     Status: None   Collection Time: 04/15/20  9:31 PM   Specimen: Nasopharyngeal Swab  Result Value Ref Range   SARS Coronavirus 2 NEGATIVE NEGATIVE    Comment: (NOTE) SARS-CoV-2 target nucleic acids are NOT DETECTED.  The SARS-CoV-2 RNA is generally detectable in upper and lower respiratory specimens during the acute phase of infection. The lowest concentration of SARS-CoV-2 viral copies this assay can detect is 250 copies / mL. A negative result does not preclude SARS-CoV-2 infection and should not be used as the sole basis for treatment or other patient management decisions.  A negative result may occur with improper specimen collection / handling, submission of specimen other than nasopharyngeal swab, presence of viral mutation(s) within the areas targeted by this assay, and inadequate number of viral copies (<250 copies / mL). A negative result must be combined with clinical observations, patient history, and epidemiological information.  Fact Sheet for Patients:   StrictlyIdeas.no  Fact Sheet for Healthcare  Providers: BankingDealers.co.za  This test is not yet approved or  cleared by the [place] FDA and has been authorized for detection and/or diagnosis of SARS-CoV-2 by FDA under an Emergency Use Authorization (EUA).  This EUA will remain in effect (meaning this test can be used) for the duration of the COVID-19 declaration under Section 564(b)(1) of the Act, 21 U.S.C. section 360bbb-3(b)(1), unless the authorization is terminated or revoked sooner.  Performed at [facility], [address] [place], [place] [address]   CBG monitoring, ED     Status: Abnormal   Collection Time: 04/15/20  9:46 PM  Result Value Ref Range   Glucose-Capillary 63 (L) 70 -  99 mg/dL    Comment: Glucose reference range applies only to samples taken after fasting for at least 8 hours.  Ethanol     Status: None   Collection Time: 04/15/20  9:53 PM  Result Value Ref Range   Alcohol, Ethyl (B) <10 <10 mg/dL    Comment: (NOTE) Lowest detectable limit for serum alcohol is 10 mg/dL.  For medical purposes only. Performed at [facility], [address] [place], [place] [address]   Protime-INR     Status: None   Collection Time: 04/15/20  9:53 PM  Result Value Ref Range   Prothrombin Time 12.0 11.4 - 15.2 seconds   INR 0.9 0.8 - 1.2    Comment: (NOTE) INR goal varies based on device and disease states. Performed at [facility], [address], [place], [place] [address]   APTT     Status: None   Collection Time: 04/15/20  9:53 PM  Result Value Ref Range   aPTT 31 24 - 36 seconds    Comment: Performed at [facility], [address], [place], [place] [address]  CBC     Status: Abnormal   Collection Time: 04/15/20  9:53 PM  Result Value Ref Range   WBC 9.6 4.0 - 10.5 K/uL   RBC 4.79 3.87 - 5.11 MIL/uL   Hemoglobin 12.3 12.0 - 15.0 g/dL   HCT 42.3 36 - 46 %   MCV 88.3 80.0 - 100.0 fL   MCH 25.7 (L) 26.0 - 34.0 pg   MCHC 29.1 (L) 30.0 - 36.0 g/dL   RDW 15.0 11.5 - 15.5 %    Platelets 418 (H) 150 - 400 K/uL   nRBC 0.0 0.0 - 0.2 %    Comment: Performed at [facility], [address], [place], [place] [address]  Differential     Status: None   Collection Time: 04/15/20  9:53 PM  Result Value Ref Range   Neutrophils Relative % 63 %   Neutro Abs 6.1 1.7 - 7.7 K/uL   Lymphocytes Relative 28 %   Lymphs Abs 2.6 0.7 - 4.0 K/uL   Monocytes Relative 7 %   Monocytes Absolute 0.7 0 - 1 K/uL   Eosinophils Relative 1 %   Eosinophils Absolute 0.1 0 - 0 K/uL   Basophils Relative 1 %   Basophils Absolute 0.1 0 - 0 K/uL   Immature Granulocytes 0 %   Abs Immature Granulocytes 0.03 0.00 - 0.07 K/uL    Comment: Performed at [facility], [address], [place], [place] [address]  Comprehensive metabolic panel     Status: Abnormal   Collection Time: 04/15/20  9:53 PM  Result Value Ref Range   Sodium 140 135 - 145 mmol/L   Potassium 3.6 3.5 - 5.1 mmol/L   Chloride 106 98 - 111 mmol/L   CO2 25 22 - 32 mmol/L   Glucose, Bld 104 (H) 70 - 99 mg/dL    Comment: Glucose reference range applies only to samples taken after fasting for at least 8 hours.   BUN 19 6 - 20 mg/dL   Creatinine, Ser 0.74 0.44 - 1.00 mg/dL   Calcium 8.9 8.9 - 10.3 mg/dL   Total Protein 7.7 6.5 - 8.1 g/dL   Albumin 4.6 3.5 - 5.0 g/dL   AST 18 15 - 41 U/L   ALT 16 0 - 44 U/L   Alkaline Phosphatase 84 38 - 126 U/L   Total Bilirubin 0.2 (L) 0.3 - 1.2 mg/dL   GFR  calc non Af Amer >60 >60 mL/min   GFR calc Af Amer >60 >60 mL/min   Anion gap 9 5 - 15    Comment: Performed at [facility], [address], [place], [place] [address]  Troponin I (High Sensitivity)     Status: None   Collection Time: 04/15/20  9:53 PM  Result Value Ref Range   Troponin I (High Sensitivity) <2 <18 ng/L    Comment: (NOTE) Elevated high sensitivity troponin I (hsTnI) values and significant  changes across serial measurements may suggest ACS but many other  chronic and acute conditions are known to elevate hsTnI results.  Refer  to the "Links" section for chest pain algorithms and additional  guidance. Performed at [facility], [address] [place], [place] [address]   Vitamin B12     Status: None   Collection Time: 04/15/20  9:53 PM  Result Value Ref Range   Vitamin B-12 518 180 - 914 pg/mL    Comment: (NOTE) This assay is not validated for testing neonatal or myeloproliferative syndrome specimens for Vitamin B12 levels. Performed at [facility], [address], [place], [place] [address]   TSH     Status: None   Collection Time: 04/15/20  9:53 PM  Result Value Ref Range   TSH 2.697 0.350 - 4.500 uIU/mL    Comment: Performed by a 3rd Generation assay with a functional sensitivity of <=0.01 uIU/mL. Performed at [facility], [address], [place], [place] [address]   Ferritin     Status: None   Collection Time: 04/15/20  9:53 PM  Result Value Ref Range   Ferritin 87 11 - 307 ng/mL    Comment: Performed at [facility], [address], [place], [place] [address]  Transferrin     Status: None   Collection Time: 04/15/20  9:53 PM  Result Value Ref Range   Transferrin 263 192 - 382 mg/dL    Comment: Performed at [facility], [address], [place], [place] [address]  Hemoglobin A1c     Status: None   Collection Time: 04/15/20  9:53 PM  Result Value Ref Range   Hgb A1c MFr Bld 5.3 4.8 - 5.6 %    Comment: (NOTE)         Prediabetes: 5.7 - 6.4         Diabetes: >6.4         Glycemic control for adults with diabetes: <7.0    Mean Plasma Glucose 105 mg/dL    Comment: (NOTE) Performed At: [facility] [place], [place] [id number] Rush Farmer MD [id number]   HIV Antibody (routine testing w rflx)     Status: None   Collection Time: 04/16/20  1:36 AM  Result Value Ref Range   HIV Screen 4th Generation wRfx Non Reactive Non Reactive    Comment: Performed at [facility], [place] [address], [place], [place] [address]  Folate, serum, performed at [facility] lab      Status: None   Collection Time: 04/16/20  1:36 AM  Result Value Ref Range   Folate 12.0 >5.9 ng/mL    Comment: Performed at [facility], [address], [place], [place] [address]  Troponin I (High Sensitivity)     Status: None   Collection Time: 04/16/20  1:36 AM  Result Value Ref Range   Troponin I (High Sensitivity) <2 <18 ng/L    Comment: (NOTE) Elevated high sensitivity troponin I (hsTnI) values and significant  changes across serial measurements may suggest ACS but many other  chronic and acute conditions are known to elevate hsTnI results.  Refer to the "Links" section for chest pain algorithms and additional  guidance. Performed at [facility], [address], [place], [place] [address]   Lipid panel     Status: None   Collection Time: 04/16/20  4:26 AM  Result Value Ref Range   Cholesterol 141 0 - 200 mg/dL   Triglycerides 24 <150 mg/dL   HDL 50 >40 mg/dL   Total CHOL/HDL Ratio 2.8 RATIO   VLDL 5 0 - 40 mg/dL   LDL Cholesterol 86 0 - 99 mg/dL    Comment:        Total Cholesterol/HDL:CHD Risk Coronary Heart Disease Risk Table                     Men   Women  1/2 Average Risk   3.4   3.3  Average Risk       5.0   4.4  2 X Average Risk   9.6   7.1  3 X Average Risk  23.4   11.0        Use the calculated Patient Ratio above and the CHD Risk Table to determine the patient's CHD Risk.        ATP III CLASSIFICATION (LDL):  <100     mg/dL   Optimal  100-129  mg/dL   Near or Above                    Optimal  130-159  mg/dL   Borderline  160-189  mg/dL   High  >190     mg/dL   Very High Performed at [facility], [address], [place], [place] [address]   CBC     Status: Abnormal   Collection Time: 04/16/20  4:26 AM  Result Value Ref Range   WBC 9.8 4.0 - 10.5 K/uL   RBC 4.36 3.87 - 5.11 MIL/uL   Hemoglobin 10.9 (L) 12.0 - 15.0 g/dL   HCT 36.8 36 - 46 %   MCV 84.4 80.0 - 100.0 fL   MCH 25.0 (L) 26.0 - 34.0 pg   MCHC 29.6 (L) 30.0 - 36.0 g/dL   RDW 14.9 11.5 -  15.5 %   Platelets 403 (H) 150 - 400 K/uL   nRBC 0.0 0.0 - 0.2 %    Comment: Performed at [facility], [address] [place], [place] [address]  Comprehensive metabolic panel     Status: Abnormal   Collection Time: 04/16/20  4:26 AM  Result Value Ref Range   Sodium 139 135 - 145 mmol/L   Potassium 3.7 3.5 - 5.1 mmol/L   Chloride 107 98 - 111 mmol/L   CO2 23 22 - 32 mmol/L   Glucose, Bld 100 (H) 70 - 99 mg/dL    Comment: Glucose reference range applies only to samples taken after fasting for at least 8 hours.   BUN 14 6 - 20 mg/dL   Creatinine, Ser 0.54 0.44 - 1.00 mg/dL   Calcium 8.5 (L) 8.9 - 10.3 mg/dL   Total Protein 6.6 6.5 - 8.1 g/dL   Albumin 3.9 3.5 - 5.0 g/dL   AST 13 (L) 15 - 41 U/L   ALT 14 0 - 44 U/L   Alkaline Phosphatase 70 38 - 126 U/L   Total Bilirubin 0.4 0.3 - 1.2 mg/dL   GFR calc non Af  Amer >60 >60 mL/min   GFR calc Af Amer >60 >60 mL/min   Anion gap 9 5 - 15    Comment: Performed at [facility], [address] [place], [place] [address]  Magnesium     Status: None   Collection Time: 04/16/20  4:26 AM  Result Value Ref Range   Magnesium 2.1 1.7 - 2.4 mg/dL    Comment: Performed at [facility], [address], [place], [place] [address]  Phosphorus     Status: None   Collection Time: 04/16/20  4:26 AM  Result Value Ref Range   Phosphorus 2.5 2.5 - 4.6 mg/dL    Comment: Performed at [facility], [address] [place], [place] [address]  VITAMIN D 25 Hydroxy (Vit-D Deficiency, Fractures)     Status: Abnormal   Collection Time: 04/16/20  4:26 AM  Result Value Ref Range   Vit D, 25-Hydroxy 24.37 (L) 30 - 100 ng/mL    Comment: (NOTE) Vitamin D deficiency has been defined by the [place] practice guideline as a level of serum 25-OH  vitamin D less than 20 ng/mL (1,2). The Endocrine Society went on to  further define vitamin D insufficiency as a level between 21 and 29  ng/mL (2).  1. IOM (Institute of Medicine).  2010. Dietary reference intakes for  calcium and D. [place]: The [employer]. 2. Holick MF, Binkley Olinda, Bischoff-Ferrari HA, et al. Evaluation,  treatment, and prevention of vitamin D deficiency: an Endocrine  Society clinical practice guideline, JCEM. 2011 Jul; 96(7): 1911-30.  Performed at [facility], [place] [address] [place], [place] [address]   CBG monitoring, ED     Status: None   Collection Time: 04/16/20  8:16 AM  Result Value Ref Range   Glucose-Capillary 92 70 - 99 mg/dL    Comment: Glucose reference range applies only to samples taken after fasting for at least 8 hours.  ECHOCARDIOGRAM COMPLETE     Status: None   Collection Time: 04/16/20  9:23 AM  Result Value Ref Range   Weight 2,156.8 oz   Height 63 in   BP 117/64 mmHg   Area-P 1/2 3.02 cm2   AR max vel 2.23 cm2   AV Area mean vel 2.26 cm2   AV Area VTI 2.19 cm2   Ao pk vel 1.47 m/s   AV Peak grad 8.6 mmHg   AV Mean grad 3.7 mmHg  Glucose, capillary     Status: None   Collection Time: 04/16/20 11:54 AM  Result Value Ref Range   Glucose-Capillary 93 70 - 99 mg/dL    Comment: Glucose reference range applies only to samples taken after fasting for at least 8 hours.  CBC with Differential/Platelet     Status: Abnormal   Collection Time: 04/25/20  2:31 PM  Result Value Ref Range   WBC 9.4 3.8 - 10.8 Thousand/uL   RBC 4.99 3.80 - 5.10 Million/uL   Hemoglobin 12.7 11.7 - 15.5 g/dL   HCT 41.1 35 - 45 %   MCV 82.4 80.0 - 100.0 fL   MCH 25.5 (L) 27.0 - 33.0 pg   MCHC 30.9 (L) 32.0 - 36.0 g/dL   RDW 13.4 11.0 - 15.0 %   Platelets 402 (H) 140 - 400 Thousand/uL   MPV 11.0 7.5 - 12.5 fL   Neutro Abs 5,866 1,500 - 7,800 cells/uL   Lymphs Abs 2,641 850 - 3,900 cells/uL   Absolute Monocytes  639 200 - 950 cells/uL   Eosinophils Absolute 150 15 - 500 cells/uL   Basophils Absolute 103 0 - 200 cells/uL   Neutrophils Relative % 62.4 %   Total Lymphocyte 28.1 %   Monocytes Relative 6.8 %    Eosinophils Relative 1.6 %   Basophils Relative 1.1 %  BASIC METABOLIC PANEL WITH GFR     Status: Abnormal   Collection Time: 04/25/20  2:31 PM  Result Value Ref Range   Glucose, Bld 84 65 - 99 mg/dL    Comment: .            Fasting reference interval .    BUN 18 7 - 25 mg/dL   Creat 0.78 0.50 - 1.05 mg/dL    Comment: For patients >41 years of age, the reference limit for Creatinine is approximately 13% higher for people identified as African-American. .    GFR, Est Non African American 88 > OR = 60 mL/min/1.60m2   GFR, Est African American 102 > OR = 60 mL/min/1.44m2   BUN/Creatinine Ratio NOT APPLICABLE 6 - 22 (calc)   Sodium 139 135 - 146 mmol/L   Potassium 5.4 (H) 3.5 - 5.3 mmol/L   Chloride 104 98 - 110 mmol/L   CO2 25 20 - 32 mmol/L   Calcium 9.6 8.6 - 10.4 mg/dL     PHQ2/9: Depression screen [facility] 2/9 04/25/2020 02/08/2020 10/03/2019 05/30/2019 05/30/2019  Decreased Interest 2 0 0 0 0  Down, Depressed, Hopeless 2 0 0 0 0  PHQ - 2 Score 4 0 0 0 0  Altered sleeping 2 3 0 0 0  Tired, decreased energy 2 0 0 0 0  Change in appetite 2 0 0 0 0  Feeling bad or failure about yourself  0 0 0 0 0  Trouble concentrating 1 0 0 0 0  Moving slowly or fidgety/restless 1 0 0 0 0  Suicidal thoughts 0 0 0 0 0  PHQ-9 Score 12 3 0 0 0  Difficult doing work/chores Very difficult Not difficult at all - - -  Some recent data might be hidden    phq 9 is positive - but secondary to admission to hospital and feeling scare    Fall Risk: Fall Risk  04/25/2020 02/08/2020 10/03/2019 05/30/2019 05/30/2019  Falls in the past year? 0 0 0 0 0  Number falls in past yr: 0 0 0 0 0  Injury with Fall? 0 0 0 0 0     Functional Status Survey: Is the patient deaf or have difficulty hearing?: No Does the patient have difficulty seeing, even when wearing glasses/contacts?: No Does the patient have difficulty concentrating, remembering, or making decisions?: Yes Does the patient have difficulty walking or  climbing stairs?: Yes Does the patient have difficulty dressing or bathing?: No Does the patient have difficulty doing errands alone such as visiting a doctor's office or shopping?: No    Assessment & Plan  1. Hospital discharge follow-up  - CBC with Differential/Platelet - BASIC METABOLIC PANEL WITH GFR  2. Anemia, unspecified type   3. Thrombocytosis ([place])   4. Raynaud's disease without gangrene  - Ambulatory referral to Rheumatology

## 2020-04-25 ENCOUNTER — Other Ambulatory Visit: Payer: Self-pay

## 2020-04-25 ENCOUNTER — Encounter: Payer: Self-pay | Admitting: Family Medicine

## 2020-04-25 ENCOUNTER — Ambulatory Visit (INDEPENDENT_AMBULATORY_CARE_PROVIDER_SITE_OTHER): Payer: No Typology Code available for payment source | Admitting: Family Medicine

## 2020-04-25 VITALS — BP 150/90 | HR 111 | Temp 98.5°F | Resp 16 | Ht 62.0 in | Wt 131.0 lb

## 2020-04-25 DIAGNOSIS — Z23 Encounter for immunization: Secondary | ICD-10-CM

## 2020-04-25 DIAGNOSIS — Z09 Encounter for follow-up examination after completed treatment for conditions other than malignant neoplasm: Secondary | ICD-10-CM | POA: Diagnosis not present

## 2020-04-25 DIAGNOSIS — D75839 Thrombocytosis, unspecified: Secondary | ICD-10-CM

## 2020-04-25 DIAGNOSIS — I7300 Raynaud's syndrome without gangrene: Secondary | ICD-10-CM | POA: Diagnosis not present

## 2020-04-25 DIAGNOSIS — D649 Anemia, unspecified: Secondary | ICD-10-CM | POA: Diagnosis not present

## 2020-04-25 DIAGNOSIS — I10 Essential (primary) hypertension: Secondary | ICD-10-CM

## 2020-04-25 DIAGNOSIS — D473 Essential (hemorrhagic) thrombocythemia: Secondary | ICD-10-CM | POA: Diagnosis not present

## 2020-04-26 ENCOUNTER — Encounter: Payer: Self-pay | Admitting: Family Medicine

## 2020-04-26 LAB — BASIC METABOLIC PANEL WITH GFR
BUN: 18 mg/dL (ref 7–25)
CO2: 25 mmol/L (ref 20–32)
Calcium: 9.6 mg/dL (ref 8.6–10.4)
Chloride: 104 mmol/L (ref 98–110)
Creat: 0.78 mg/dL (ref 0.50–1.05)
GFR, Est African American: 102 mL/min/{1.73_m2} (ref >=60)
GFR, Est Non African American: 88 mL/min/{1.73_m2} (ref >=60)
Glucose, Bld: 84 mg/dL (ref 65–99)
Potassium: 5.4 mmol/L — ABNORMAL HIGH (ref 3.5–5.3)
Sodium: 139 mmol/L (ref 135–146)

## 2020-04-26 LAB — CBC WITH DIFFERENTIAL/PLATELET
Absolute Monocytes: 639 cells/uL (ref 200–950)
Basophils Absolute: 103 cells/uL (ref 0–200)
Basophils Relative: 1.1 %
Eosinophils Absolute: 150 cells/uL (ref 15–500)
Eosinophils Relative: 1.6 %
HCT: 41.1 % (ref 35.0–45.0)
Hemoglobin: 12.7 g/dL (ref 11.7–15.5)
Lymphs Abs: 2641 cells/uL (ref 850–3900)
MCH: 25.5 pg — ABNORMAL LOW (ref 27.0–33.0)
MCHC: 30.9 g/dL — ABNORMAL LOW (ref 32.0–36.0)
MCV: 82.4 fL (ref 80.0–100.0)
MPV: 11 fL (ref 7.5–12.5)
Monocytes Relative: 6.8 %
Neutro Abs: 5866 cells/uL (ref 1500–7800)
Neutrophils Relative %: 62.4 %
Platelets: 402 10*3/uL — ABNORMAL HIGH (ref 140–400)
RBC: 4.99 10*6/uL (ref 3.80–5.10)
RDW: 13.4 % (ref 11.0–15.0)
Total Lymphocyte: 28.1 %
WBC: 9.4 10*3/uL (ref 3.8–10.8)

## 2020-04-30 ENCOUNTER — Encounter: Payer: Self-pay | Admitting: *Deleted

## 2020-04-30 ENCOUNTER — Other Ambulatory Visit: Payer: Self-pay | Admitting: *Deleted

## 2020-04-30 NOTE — Patient Outreach (Signed)
[facility]) Care Management  04/30/2020  ROSEALEE RECINOS May 02, 1971 [id number]   Transition of care call/case closure   Referral received:04/30/20 Initial outreach:04/30/20 Insurance: [place] [employer]   Subjective: Initial successful telephone call to patient's preferred number in order to complete transition of care assessment; 2 HIPAA identifiers verified. Explained purpose of call and completed transition of care assessment.  Loral states that she is still a little low working on getting her energy back. She discussed tiring easily and working on moving around more. Encouraged rest between activity. She reports improvement in tingling sensation at left hand, improvement discoloration of finger, she reports continuing to use nitroglycerin paste as prescribed. She voiced looking forward to follow appointment with neurologist on this and earliest appointentn available with rheumatology is October. Reviewed symptomsof TIA to seek medical attention for sooner. She reports following a healthy diet at home usually, but decrease in appetite since recent medical problems.   Patient mother and sister  assisting with her  recovery. Patient discussed having 73 year old son with autism currently in school at this time, she reports having support from her family. She discussed she is frequently asked about son,she states she is a strong mother and managing well with her son, she discussed that she faces other stress, current medical problem she faced was eye opening  ,discussed [place] benefits for support.  Reviewed accessing the following [place] care benefits.  She discussed  health issues of Diabetes, hypertension and hyperlipidemia being managed well  and says she does not need a referral to one of the [place] chronic disease management programs.  Discussed employee assistance counseling program and support services provided, provided contact number to arrange and appointment  she is agreeable to making a appointment for follow up.   She believes that she the hospital indemnity provided contact number to [employer] to file a claim if needed.  She uses a [facility] outpatient pharmacy at [place].  .    Objective:  Macaela Presas  was hospitalized at [facility]  From 8/23-8/24/21 F for Transient ischemic attack symptoms, numbness and tingling in left hand fingers Comorbidities include:Hyperlipidemia, Diabetes, Gastric bypass surgery, Raynauds phenomenon,  GERDS, migraine headache depression anxiety.  She was discharged to home on 04/16/20 without the need for home health services or DME.   Assessment:  Patient voices good understanding of all discharge instructions.  See transition of care flowsheet for assessment details.   Plan:  Reviewed hospital discharge diagnosis of Transient ischemic attack, numbness tingling left hand, Raynauds  and discharge treatment plan using hospital discharge instructions, assessing medication adherence, reviewing problems requiring provider notification, and discussing the importance of follow up with  primary care provider and/or specialists as directed.  Reviewed [place] healthy lifestyle program information to receive discounted premium for  2022   Step 1: Get  your annual physical  Step 2: Complete your health assessment  Step 3:Identify your current health status and complete the corresponding action step between January 1, and April 24, 2020.  Extended to October 1.     No ongoing care management needs identified so will close case to [facility] services and route successful outreach letter with [facility] [employer] pamphlet and 24 Hour Nurse Line Magnet to [facility] clinical pool to be mailed to patient's home address.  Thanked patient for their services to [facility].   Joylene Draft, RN, BSN  [facility]  Care  Management,Care Management Coordinator  [phone]- Mobile [phone] Toll Free Main Office

## 2020-05-06 DIAGNOSIS — G459 Transient cerebral ischemic attack, unspecified: Secondary | ICD-10-CM | POA: Insufficient documentation

## 2020-05-23 ENCOUNTER — Encounter: Payer: Self-pay | Admitting: Family Medicine

## 2020-06-18 ENCOUNTER — Encounter: Payer: Self-pay | Admitting: Family Medicine

## 2020-06-18 ENCOUNTER — Other Ambulatory Visit: Payer: Self-pay | Admitting: Family Medicine

## 2020-06-18 ENCOUNTER — Other Ambulatory Visit: Payer: Self-pay

## 2020-06-18 ENCOUNTER — Ambulatory Visit (INDEPENDENT_AMBULATORY_CARE_PROVIDER_SITE_OTHER): Payer: No Typology Code available for payment source | Admitting: Family Medicine

## 2020-06-18 VITALS — BP 126/72 | HR 93 | Temp 98.0°F | Resp 14 | Ht 62.0 in | Wt 134.5 lb

## 2020-06-18 DIAGNOSIS — K219 Gastro-esophageal reflux disease without esophagitis: Secondary | ICD-10-CM

## 2020-06-18 DIAGNOSIS — Z1231 Encounter for screening mammogram for malignant neoplasm of breast: Secondary | ICD-10-CM | POA: Diagnosis not present

## 2020-06-18 DIAGNOSIS — E7800 Pure hypercholesterolemia, unspecified: Secondary | ICD-10-CM

## 2020-06-18 DIAGNOSIS — Z9884 Bariatric surgery status: Secondary | ICD-10-CM

## 2020-06-18 DIAGNOSIS — D75839 Thrombocytosis, unspecified: Secondary | ICD-10-CM

## 2020-06-18 DIAGNOSIS — Z8673 Personal history of transient ischemic attack (TIA), and cerebral infarction without residual deficits: Secondary | ICD-10-CM

## 2020-06-18 DIAGNOSIS — L308 Other specified dermatitis: Secondary | ICD-10-CM

## 2020-06-18 DIAGNOSIS — E1121 Type 2 diabetes mellitus with diabetic nephropathy: Secondary | ICD-10-CM | POA: Diagnosis not present

## 2020-06-18 DIAGNOSIS — I7300 Raynaud's syndrome without gangrene: Secondary | ICD-10-CM

## 2020-06-18 DIAGNOSIS — F5104 Psychophysiologic insomnia: Secondary | ICD-10-CM

## 2020-06-18 DIAGNOSIS — I10 Essential (primary) hypertension: Secondary | ICD-10-CM

## 2020-06-18 DIAGNOSIS — L80 Vitiligo: Secondary | ICD-10-CM

## 2020-06-18 DIAGNOSIS — G43009 Migraine without aura, not intractable, without status migrainosus: Secondary | ICD-10-CM

## 2020-06-18 DIAGNOSIS — E538 Deficiency of other specified B group vitamins: Secondary | ICD-10-CM

## 2020-06-18 MED ORDER — ZOLPIDEM TARTRATE 10 MG PO TABS: 10.0000 mg | ORAL_TABLET | Freq: Every day | ORAL | 0 refills | Status: DC

## 2020-06-18 MED ORDER — ROSUVASTATIN CALCIUM 10 MG PO TABS: 10.0000 mg | ORAL_TABLET | Freq: Every day | ORAL | 0 refills | Status: DC

## 2020-06-18 NOTE — Progress Notes (Signed)
Name: Shannon Soto   MRN: [id number]    DOB: 04-Oct-1970   Date:06/18/2020       Progress Note  Subjective  Chief Complaint  Chief Complaint  Patient presents with  . Follow-up  . Diabetes    HPI   Bariatric Surgery: she had sleeve surgery done by Dr. Hassell Done on March 14th, 2016, she has achieved her goal weight of145 lbs,today weight isup to 150 lbs, she states she is very stressed, skipping meals and not as active since COVI-19Weight prior to surgery was 242 lbsShe is doing well,hgbA1C started to go up, and we started her on Ozempic 07/2018weightwas in the 140 range until Fall 2020 when weight went up to 150 lbs on her last visit, since Feb she lost down to 137 lbs by increasing her walks, weight was stable however when admitted to [facility] for possible TIA in August she lost her appetite and weight dropped to 131 lbs. She has increased protein intake since her last visit in September and her weight is 134.5 lbs , she is trying to get back to 137 - 140 lbs  She has recurrent rash under breast( but that part is betterwith topical medication)and abdominal folds from the significant weight loss and has to use Nystatin prn and medication helps,she states still has burning sensation, medication seems to help but sometimes it has flares with redness and irritation.She is considering tummy tuck, but is trying to wait for now   Eczema: she has recurrent symptoms during winter months when skin is dry, she continues to have outbreaks and using Triamcinolone, we will send her back to dermatologist   Vitiligo: getting more prominent , spreading and the ones she has are getting larger,she would like to go back to Dermatologist   GERD: she has a long history of reflux, symptoms were controlled with 20 mg,however over the past year she has been on higher dose to control symptoms, tolerating 40 mg without problems  Hyperlipidemia: , however because she has DM,she is taking pravastatin daily  .HDL was very good Reviewed last labs and LDL was 86, with a history of TIA, we will change to Crestor and she will return to do all her labs next month   HTN: she is on Norvasc for Raynaud's and Losartan for microalbuminuria,bp isat goal today. No chest pain or  dizziness. She has intermittent palpitation lasts a few seconds , not associated with nausea or diaphoresis . Unchanged   Insomnia: she takes Ambien, she has been on medication for many years, she used to take high dose, now she is trying to make it last and at times takes half. She is going to bed around 11 pm We will add hydroxyzine for her to take on weekends when she takes half dose of Ambien and cannot fall back asleep after a few hours. Stable , needs a refill today   DMII with peripheral neuropathy and nephropathy: off metformin since bariatric surgery in 10/2014 , no polyphagia, but has episodes of polydipsia but no polyuria  but has nocturia timesonceevery night.. On Gabapentin seems to help with neuropathy, taking one and a half 600 mg tablets at night only,She is back on Losartan , because last urine micro was  100. Last A1C was down to 5.3% , she has been tolerating Ozempic, no side effects , she does not want to stop medication   Major depression:chronic symptoms since her son was born. She has beentaking Fluoxetine, one daily, occasionally takes one extra pill during  the day, discussed taking hydroxizine prn for anxiety . No side effects. Denies crying spells, or anhedonia, just worries constantly about her son that has special needs .He is now switching to HS. She was very stressed after hospital stay but is now back to her normal self and trying to find a fun hobby to do  Migraines: she has tried multiple medications, but could not tolerate, episodes about1-2times months. Episodes are described as pain is behind her eyes, throbbing like, associated with nauseawhen she has a severe episode and photophobia.  It can last up to 4 hours with medication ( Tylenol ) she is also takingGoody powdersintermittently but no longer taking ibuprofen - she is responding to Nurteq and not taking it as often .  Raynaud's: symptoms offingers on both hands gets white , painful and numb, not sure of the triggers at this time.  Worse this time of the year due to cold weather, it is back to baseline  History of TIA: admitted back in August 2021 with left middle finger pain, intense, seen by neurologist, questionable TIA versus severe Raynaud's , symptoms lasted days, but is pain finally resolved, she has seen Rheumatologist and neurologist since discharge and is going to see cardiologist soon. She is back to work, appetite is improving.    Patient Active Problem List   Diagnosis Date Noted  . TIA (transient ischemic attack) 05/06/2020  . Numbness and tingling in left hand 04/16/2020  . Encounter for screening colonoscopy   . Polyp of sigmoid colon   . Controlled type 2 diabetes mellitus with neuropathy ([place]) 05/12/2016  . Raynaud's phenomenon 05/12/2016  . Intertrigo 09/06/2015  . Migraine headache without aura 02/28/2015  . History of obesity 02/28/2015  . Hyperlipidemia 02/28/2015  . GERD (gastroesophageal reflux disease) 02/28/2015  . Vitamin D deficiency 02/28/2015  . Chronic constipation 02/28/2015  . Chronic insomnia 02/28/2015  . Peripheral neuropathy 02/28/2015  . Vitiligo 02/28/2015  . Lower leg mass 02/28/2015  . Depression with anxiety 02/28/2015  . History of bariatric surgery 11/05/2014    Past Surgical History:  Procedure Laterality Date  . ABDOMINAL HYSTERECTOMY    . APPENDECTOMY    . BREAST SURGERY    . BREATH TEK H PYLORI N/A 08/27/2014   Procedure: BREATH TEK H PYLORI;  Surgeon: Pedro Earls, MD;  Location: [facility];  Service: General;  Laterality: N/A;  . CATARACT EXTRACTION W/PHACO Left 09/20/2019   Procedure: CATARACT EXTRACTION PHACO AND INTRAOCULAR LENS PLACEMENT (IOC)  LEFT 0.74 00:12.3 6.0%;  Surgeon: Leandrew Koyanagi, MD;  Location: [place];  Service: Ophthalmology;  Laterality: Left;  . CATARACT EXTRACTION W/PHACO Right 10/11/2019   Procedure: CATARACT EXTRACTION PHACO AND INTRAOCULAR LENS PLACEMENT (IOC)  RIGHT 1.55 00:24.1 6.4%;  Surgeon: Leandrew Koyanagi, MD;  Location: [place];  Service: Ophthalmology;  Laterality: Right;  diabetic  . COLONOSCOPY WITH PROPOFOL N/A 02/17/2019   Procedure: COLONOSCOPY WITH PROPOFOL;  Surgeon: Lucilla Lame, MD;  Location: [place];  Service: Endoscopy;  Laterality: N/A;  . HIATAL HERNIA REPAIR  11/05/2014   Procedure: LAPAROSCOPIC REPAIR OF HIATAL HERNIA;  Surgeon: Johnathan Hausen, MD;  Location: [facility];  Service: General;;  . LAPAROSCOPIC GASTRIC SLEEVE RESECTION N/A 11/05/2014   Procedure: LAPAROSCOPIC GASTRIC SLEEVE RESECTION;  Surgeon: Johnathan Hausen, MD;  Location: [facility];  Service: General;  Laterality: N/A;  . POLYPECTOMY  02/17/2019   Procedure: POLYPECTOMY;  Surgeon: Lucilla Lame, MD;  Location: [place];  Service: Endoscopy;;  . REDUCTION MAMMAPLASTY  Bilateral 1997  . UPPER GI ENDOSCOPY  11/05/2014   Procedure: UPPER GI ENDOSCOPY;  Surgeon: Johnathan Hausen, MD;  Location: [facility];  Service: General;;    Family History  Problem Relation Age of Onset  . Diabetes Paternal Uncle   . Stroke Father   . Hypertension Other   . Breast cancer Maternal Grandmother 26    Social History   Tobacco Use  . Smoking status: Never Smoker  . Smokeless tobacco: Never Used  Substance Use Topics  . Alcohol use: No    Alcohol/week: 0.0 standard drinks    Comment: occasional      Current Outpatient Medications:  .  amLODipine (NORVASC) 10 MG tablet, Take 1 tablet (10 mg total) by mouth daily., Disp: 90 tablet, Rfl: 1 .  Calcium Carb-Cholecalciferol (CALCIUM-VITAMIN D3) 500-400 MG-UNIT TABS, Take 1 tablet by mouth 3 (three) times daily., Disp: , Rfl:  .  cyanocobalamin 500 MCG  tablet, Take 1 mcg by mouth every morning., Disp: , Rfl:  .  ferrous gluconate ([employer] IRON) 240 (27 FE) MG tablet, Take 1 tablet (240 mg total) by mouth 3 (three) times daily with meals., Disp: 30 tablet, Rfl: 0 .  FLUoxetine (PROZAC) 10 MG capsule, Take 1 capsule (10 mg total) by mouth daily., Disp: 90 capsule, Rfl: 1 .  gabapentin (NEURONTIN) 600 MG tablet, Take 1.5 tablets (900 mg total) by mouth at bedtime., Disp: 135 tablet, Rfl: 1 .  hydrOXYzine (ATARAX/VISTARIL) 10 MG tablet, Take 1 tablet (10 mg total) by mouth every 8 (eight) hours as needed., Disp: 90 tablet, Rfl: 0 .  losartan (COZAAR) 100 MG tablet, Take 1 tablet (100 mg total) by mouth daily., Disp: 90 tablet, Rfl: 1 .  Multiple Vitamins-Minerals (MULTIVITAMIN ADULTS PO), Take 1 tablet by mouth daily., Disp: , Rfl:  .  nitroGLYCERIN (NITROGLYN) 2 % ointment, Apply 0.5 inches topically every 6 (six) hours., Disp: 30 g, Rfl: 0 .  nystatin (NYSTATIN) powder, Apply topically 4 (four) times daily., Disp: 60 g, Rfl: 2 .  omeprazole (PRILOSEC) 20 MG capsule, Take 1 capsule (20 mg total) by mouth 2 (two) times daily., Disp: 180 capsule, Rfl: 1 .  ondansetron (ZOFRAN ODT) 4 MG disintegrating tablet, Take 1 tablet (4 mg total) by mouth every 8 (eight) hours as needed for nausea or vomiting., Disp: 20 tablet, Rfl: 0 .  Rimegepant Sulfate (NURTEC) 75 MG TBDP, Take 1 tablet by mouth daily as needed., Disp: 8 tablet, Rfl: 1 .  Semaglutide, 1 MG/DOSE, (OZEMPIC, 1 MG/DOSE,) 2 MG/1.5ML SOPN, Inject 0.75 mLs (1 mg total) into the skin once a week., Disp: 3 mL, Rfl: 5 .  triamcinolone cream (KENALOG) 0.1 %, Apply topically 2 (two) times daily., Disp: 453.9 g, Rfl: 0 .  zolpidem (AMBIEN) 10 MG tablet, Take 1 tablet (10 mg total) by mouth at bedtime., Disp: 90 tablet, Rfl: 0 .  rosuvastatin (CRESTOR) 10 MG tablet, Take 1 tablet (10 mg total) by mouth daily. In place of pravastatin, Disp: 90 tablet, Rfl: 0  No Known Allergies  I personally reviewed active  problem list, medication list, allergies, family history, social history, health maintenance with the patient/caregiver today.   ROS  Constitutional: Negative for fever or weight change.  Respiratory: Negative for cough and shortness of breath.   Cardiovascular: Negative for chest pain or palpitations.  Gastrointestinal: Negative for abdominal pain, no bowel changes.  Musculoskeletal: Negative for gait problem or joint swelling.  Skin: Positive  for rash.  Neurological: Negative for dizziness or  headache.  No other specific complaints in a complete review of systems (except as listed in HPI above).  Objective  Vitals:   06/18/20 1315  BP: 126/72  Pulse: 93  Resp: 14  Temp: 98 F (36.7 C)  TempSrc: Oral  SpO2: 94%  Weight: 134 lb 8 oz (61 kg)  Height: 5\' 2"  (1.575 m)    Body mass index is 24.6 kg/m.  Physical Exam  Constitutional: Patient appears well-developed and well-nourished.  No distress.  HEENT: head atraumatic, normocephalic, pupils equal and reactive to light, e neck supple Cardiovascular: Normal rate, regular rhythm and normal heart sounds.  No murmur heard. No BLE edema. Pulmonary/Chest: Effort normal and breath sounds normal. No respiratory distress. Abdominal: Soft.  There is no tenderness. Psychiatric: Patient has a normal mood and affect. behavior is normal. Judgment and thought content normal. Skin: eczematous patches on trunk, vitiligo   Recent Results (from the past 2160 hour(s))  SARS Coronavirus 2 by RT PCR (hospital order, performed in [facility] hospital lab) Nasopharyngeal Nasopharyngeal Swab     Status: None   Collection Time: 04/15/20  9:31 PM   Specimen: Nasopharyngeal Swab  Result Value Ref Range   SARS Coronavirus 2 NEGATIVE NEGATIVE    Comment: (NOTE) SARS-CoV-2 target nucleic acids are NOT DETECTED.  The SARS-CoV-2 RNA is generally detectable in upper and lower respiratory specimens during the acute phase of infection. The  lowest concentration of SARS-CoV-2 viral copies this assay can detect is 250 copies / mL. A negative result does not preclude SARS-CoV-2 infection and should not be used as the sole basis for treatment or other patient management decisions.  A negative result may occur with improper specimen collection / handling, submission of specimen other than nasopharyngeal swab, presence of viral mutation(s) within the areas targeted by this assay, and inadequate number of viral copies (<250 copies / mL). A negative result must be combined with clinical observations, patient history, and epidemiological information.  Fact Sheet for Patients:   StrictlyIdeas.no  Fact Sheet for Healthcare Providers: BankingDealers.co.za  This test is not yet approved or  cleared by the [place] FDA and has been authorized for detection and/or diagnosis of SARS-CoV-2 by FDA under an Emergency Use Authorization (EUA).  This EUA will remain in effect (meaning this test can be used) for the duration of the COVID-19 declaration under Section 564(b)(1) of the Act, 21 U.S.C. section 360bbb-3(b)(1), unless the authorization is terminated or revoked sooner.  Performed at [facility], [address] [place], [place] [address]   CBG monitoring, ED     Status: Abnormal   Collection Time: 04/15/20  9:46 PM  Result Value Ref Range   Glucose-Capillary 63 (L) 70 - 99 mg/dL    Comment: Glucose reference range applies only to samples taken after fasting for at least 8 hours.  Ethanol     Status: None   Collection Time: 04/15/20  9:53 PM  Result Value Ref Range   Alcohol, Ethyl (B) <10 <10 mg/dL    Comment: (NOTE) Lowest detectable limit for serum alcohol is 10 mg/dL.  For medical purposes only. Performed at [facility], [address] [place], [place] [address]   Protime-INR     Status: None   Collection Time: 04/15/20  9:53 PM  Result Value Ref Range   Prothrombin  Time 12.0 11.4 - 15.2 seconds   INR 0.9 0.8 - 1.2    Comment: (NOTE) INR goal varies based on device and disease states. Performed at  [facility], [address] [place], [place] [address]   APTT     Status: None   Collection Time: 04/15/20  9:53 PM  Result Value Ref Range   aPTT 31 24 - 36 seconds    Comment: Performed at [facility], [address], [place], [place] [address]  CBC     Status: Abnormal   Collection Time: 04/15/20  9:53 PM  Result Value Ref Range   WBC 9.6 4.0 - 10.5 K/uL   RBC 4.79 3.87 - 5.11 MIL/uL   Hemoglobin 12.3 12.0 - 15.0 g/dL   HCT 42.3 36 - 46 %   MCV 88.3 80.0 - 100.0 fL   MCH 25.7 (L) 26.0 - 34.0 pg   MCHC 29.1 (L) 30.0 - 36.0 g/dL   RDW 15.0 11.5 - 15.5 %   Platelets 418 (H) 150 - 400 K/uL   nRBC 0.0 0.0 - 0.2 %    Comment: Performed at [facility], [address] [place], [place] [address]  Differential     Status: None   Collection Time: 04/15/20  9:53 PM  Result Value Ref Range   Neutrophils Relative % 63 %   Neutro Abs 6.1 1.7 - 7.7 K/uL   Lymphocytes Relative 28 %   Lymphs Abs 2.6 0.7 - 4.0 K/uL   Monocytes Relative 7 %   Monocytes Absolute 0.7 0.1 - 1.0 K/uL   Eosinophils Relative 1 %   Eosinophils Absolute 0.1 0.0 - 0.5 K/uL   Basophils Relative 1 %   Basophils Absolute 0.1 0.0 - 0.1 K/uL   Immature Granulocytes 0 %   Abs Immature Granulocytes 0.03 0.00 - 0.07 K/uL    Comment: Performed at [facility], [address] [place], [place] [address]  Comprehensive metabolic panel     Status: Abnormal   Collection Time: 04/15/20  9:53 PM  Result Value Ref Range   Sodium 140 135 - 145 mmol/L   Potassium 3.6 3.5 - 5.1 mmol/L   Chloride 106 98 - 111 mmol/L   CO2 25 22 - 32 mmol/L   Glucose, Bld 104 (H) 70 - 99 mg/dL    Comment: Glucose reference range applies only to samples taken after fasting for at least 8 hours.   BUN 19 6 - 20 mg/dL   Creatinine, Ser 0.74 0.44 - 1.00 mg/dL   Calcium 8.9 8.9 - 10.3 mg/dL   Total Protein 7.7 6.5 -  8.1 g/dL   Albumin 4.6 3.5 - 5.0 g/dL   AST 18 15 - 41 U/L   ALT 16 0 - 44 U/L   Alkaline Phosphatase 84 38 - 126 U/L   Total Bilirubin 0.2 (L) 0.3 - 1.2 mg/dL   GFR calc non Af Amer >60 >60 mL/min   GFR calc Af Amer >60 >60 mL/min   Anion gap 9 5 - 15    Comment: Performed at [facility], [address] [place], [place] [address]  Troponin I (High Sensitivity)     Status: None   Collection Time: 04/15/20  9:53 PM  Result Value Ref Range   Troponin I (High Sensitivity) <2 <18 ng/L    Comment: (NOTE) Elevated high sensitivity troponin I (hsTnI) values and significant  changes across serial measurements may suggest ACS but many other  chronic and acute conditions are known to elevate hsTnI results.  Refer to the "Links" section for chest pain algorithms and additional  guidance. Performed at [facility], [address], [place], [place] [address]  Vitamin B12     Status: None   Collection Time: 04/15/20  9:53 PM  Result Value Ref Range   Vitamin B-12 518 180 - 914 pg/mL    Comment: (NOTE) This assay is not validated for testing neonatal or myeloproliferative syndrome specimens for Vitamin B12 levels. Performed at [facility], [address] [place], [place] [address]   TSH     Status: None   Collection Time: 04/15/20  9:53 PM  Result Value Ref Range   TSH 2.697 0.350 - 4.500 uIU/mL    Comment: Performed by a 3rd Generation assay with a functional sensitivity of <=0.01 uIU/mL. Performed at [facility], [address] [place], [place] [address]   Ferritin     Status: None   Collection Time: 04/15/20  9:53 PM  Result Value Ref Range   Ferritin 87 11 - 307 ng/mL    Comment: Performed at [facility], [address] [place], [place] [address]  Transferrin     Status: None   Collection Time: 04/15/20  9:53 PM  Result Value Ref Range   Transferrin 263 192 - 382 mg/dL    Comment: Performed at [facility], [address], [place], [place] [address]  Hemoglobin A1c     Status:  None   Collection Time: 04/15/20  9:53 PM  Result Value Ref Range   Hgb A1c MFr Bld 5.3 4.8 - 5.6 %    Comment: (NOTE)         Prediabetes: 5.7 - 6.4         Diabetes: >6.4         Glycemic control for adults with diabetes: <7.0    Mean Plasma Glucose 105 mg/dL    Comment: (NOTE) Performed At: [facility] [place], [place] [id number] Rush Farmer MD [id number]   HIV Antibody (routine testing w rflx)     Status: None   Collection Time: 04/16/20  1:36 AM  Result Value Ref Range   HIV Screen 4th Generation wRfx Non Reactive Non Reactive    Comment: Performed at [facility], [place] [address] [place], [place] [address]  Folate, serum, performed at [facility] lab     Status: None   Collection Time: 04/16/20  1:36 AM  Result Value Ref Range   Folate 12.0 >5.9 ng/mL    Comment: Performed at [facility], [address] [place], [place] [address]  Troponin I (High Sensitivity)     Status: None   Collection Time: 04/16/20  1:36 AM  Result Value Ref Range   Troponin I (High Sensitivity) <2 <18 ng/L    Comment: (NOTE) Elevated high sensitivity troponin I (hsTnI) values and significant  changes across serial measurements may suggest ACS but many other  chronic and acute conditions are known to elevate hsTnI results.  Refer to the "Links" section for chest pain algorithms and additional  guidance. Performed at [facility], [address] [place], [place] [address]   Lipid panel     Status: None   Collection Time: 04/16/20  4:26 AM  Result Value Ref Range   Cholesterol 141 0 - 200 mg/dL   Triglycerides 24 <150 mg/dL   HDL 50 >40 mg/dL   Total CHOL/HDL Ratio 2.8 RATIO   VLDL 5 0 - 40 mg/dL   LDL Cholesterol 86 0 - 99 mg/dL    Comment:        Total Cholesterol/HDL:CHD Risk Coronary Heart Disease Risk Table  Men   Women  1/2 Average Risk   3.4   3.3  Average Risk       5.0   4.4  2 X Average Risk   9.6   7.1  3 X Average Risk   23.4   11.0        Use the calculated Patient Ratio above and the CHD Risk Table to determine the patient's CHD Risk.        ATP III CLASSIFICATION (LDL):  <100     mg/dL   Optimal  100-129  mg/dL   Near or Above                    Optimal  130-159  mg/dL   Borderline  160-189  mg/dL   High  >190     mg/dL   Very High Performed at [facility], [address], [place], [place] [address]   CBC     Status: Abnormal   Collection Time: 04/16/20  4:26 AM  Result Value Ref Range   WBC 9.8 4.0 - 10.5 K/uL   RBC 4.36 3.87 - 5.11 MIL/uL   Hemoglobin 10.9 (L) 12.0 - 15.0 g/dL   HCT 36.8 36 - 46 %   MCV 84.4 80.0 - 100.0 fL   MCH 25.0 (L) 26.0 - 34.0 pg   MCHC 29.6 (L) 30.0 - 36.0 g/dL   RDW 14.9 11.5 - 15.5 %   Platelets 403 (H) 150 - 400 K/uL   nRBC 0.0 0.0 - 0.2 %    Comment: Performed at [facility], [address] [place], [place] [address]  Comprehensive metabolic panel     Status: Abnormal   Collection Time: 04/16/20  4:26 AM  Result Value Ref Range   Sodium 139 135 - 145 mmol/L   Potassium 3.7 3.5 - 5.1 mmol/L   Chloride 107 98 - 111 mmol/L   CO2 23 22 - 32 mmol/L   Glucose, Bld 100 (H) 70 - 99 mg/dL    Comment: Glucose reference range applies only to samples taken after fasting for at least 8 hours.   BUN 14 6 - 20 mg/dL   Creatinine, Ser 0.54 0.44 - 1.00 mg/dL   Calcium 8.5 (L) 8.9 - 10.3 mg/dL   Total Protein 6.6 6.5 - 8.1 g/dL   Albumin 3.9 3.5 - 5.0 g/dL   AST 13 (L) 15 - 41 U/L   ALT 14 0 - 44 U/L   Alkaline Phosphatase 70 38 - 126 U/L   Total Bilirubin 0.4 0.3 - 1.2 mg/dL   GFR calc non Af Amer >60 >60 mL/min   GFR calc Af Amer >60 >60 mL/min   Anion gap 9 5 - 15    Comment: Performed at [facility], [address] [place], [place] [address]  Magnesium     Status: None   Collection Time: 04/16/20  4:26 AM  Result Value Ref Range   Magnesium 2.1 1.7 - 2.4 mg/dL    Comment: Performed at [facility], [address], [place], [place] [address]  Phosphorus      Status: None   Collection Time: 04/16/20  4:26 AM  Result Value Ref Range   Phosphorus 2.5 2.5 - 4.6 mg/dL    Comment: Performed at [facility], [address], [place], [place] [address]  VITAMIN D 25 Hydroxy (Vit-D Deficiency, Fractures)     Status: Abnormal   Collection Time: 04/16/20  4:26 AM  Result Value Ref Range  Vit D, 25-Hydroxy 24.37 (L) 30 - 100 ng/mL    Comment: (NOTE) Vitamin D deficiency has been defined by the [place] practice guideline as a level of serum 25-OH  vitamin D less than 20 ng/mL (1,2). The Endocrine Society went on to  further define vitamin D insufficiency as a level between 21 and 29  ng/mL (2).  1. IOM (Institute of Medicine). 2010. Dietary reference intakes for  calcium and D. [place]: The [employer]. 2. Holick MF, Binkley [place], Bischoff-Ferrari HA, et al. Evaluation,  treatment, and prevention of vitamin D deficiency: an Endocrine  Society clinical practice guideline, JCEM. 2011 Jul; 96(7): 1911-30.  Performed at [facility], [place] [address] [place], [place] [address]   CBG monitoring, ED     Status: None   Collection Time: 04/16/20  8:16 AM  Result Value Ref Range   Glucose-Capillary 92 70 - 99 mg/dL    Comment: Glucose reference range applies only to samples taken after fasting for at least 8 hours.  ECHOCARDIOGRAM COMPLETE     Status: None   Collection Time: 04/16/20  9:23 AM  Result Value Ref Range   Weight 2,156.8 oz   Height 63 in   BP 117/64 mmHg   Area-P 1/2 3.02 cm2   AR max vel 2.23 cm2   AV Area mean vel 2.26 cm2   AV Area VTI 2.19 cm2   Ao pk vel 1.47 m/s   AV Peak grad 8.6 mmHg   AV Mean grad 3.7 mmHg  Glucose, capillary     Status: None   Collection Time: 04/16/20 11:54 AM  Result Value Ref Range   Glucose-Capillary 93 70 - 99 mg/dL    Comment: Glucose reference range applies only to samples taken after fasting for at least 8 hours.  CBC with  Differential/Platelet     Status: Abnormal   Collection Time: 04/25/20  2:31 PM  Result Value Ref Range   WBC 9.4 3.8 - 10.8 Thousand/uL   RBC 4.99 3.80 - 5.10 Million/uL   Hemoglobin 12.7 11.7 - 15.5 g/dL   HCT 41.1 35 - 45 %   MCV 82.4 80.0 - 100.0 fL   MCH 25.5 (L) 27.0 - 33.0 pg   MCHC 30.9 (L) 32.0 - 36.0 g/dL   RDW 13.4 11.0 - 15.0 %   Platelets 402 (H) 140 - 400 Thousand/uL   MPV 11.0 7.5 - 12.5 fL   Neutro Abs 5,866 1,500 - 7,800 cells/uL   Lymphs Abs 2,641 850 - 3,900 cells/uL   Absolute Monocytes 639 200 - 950 cells/uL   Eosinophils Absolute 150 15.0 - 500.0 cells/uL   Basophils Absolute 103 0.0 - 200.0 cells/uL   Neutrophils Relative % 62.4 %   Total Lymphocyte 28.1 %   Monocytes Relative 6.8 %   Eosinophils Relative 1.6 %   Basophils Relative 1.1 %  BASIC METABOLIC PANEL WITH GFR     Status: Abnormal   Collection Time: 04/25/20  2:31 PM  Result Value Ref Range   Glucose, Bld 84 65 - 99 mg/dL    Comment: .            Fasting reference interval .    BUN 18 7 - 25 mg/dL   Creat 0.78 0.50 - 1.05 mg/dL    Comment: For patients >70 years of age, the reference limit for Creatinine is approximately 13% higher for people identified as African-American. Marland Kitchen  GFR, Est Non African American 88 > OR = 60 mL/min/1.63m2   GFR, Est African American 102 > OR = 60 mL/min/1.62m2   BUN/Creatinine Ratio NOT APPLICABLE 6 - 22 (calc)   Sodium 139 135 - 146 mmol/L   Potassium 5.4 (H) 3.5 - 5.3 mmol/L   Chloride 104 98 - 110 mmol/L   CO2 25 20 - 32 mmol/L   Calcium 9.6 8.6 - 10.4 mg/dL    Diabetic Foot Exam: Diabetic Foot Exam - Simple   Simple Foot Form Diabetic Foot exam was performed with the following findings: Yes 06/18/2020  1:45 PM  Visual Inspection No deformities, no ulcerations, no other skin breakdown bilaterally: Yes Sensation Testing Intact to touch and monofilament testing bilaterally: Yes Pulse Check Posterior Tibialis and Dorsalis pulse intact bilaterally:  Yes Comments     PHQ2/9: Depression screen [facility] 2/9 06/18/2020 04/25/2020 02/08/2020 10/03/2019 05/30/2019  Decreased Interest 0 2 0 0 0  Down, Depressed, Hopeless 0 2 0 0 0  PHQ - 2 Score 0 4 0 0 0  Altered sleeping 1 2 3  0 0  Tired, decreased energy 1 2 0 0 0  Change in appetite 1 2 0 0 0  Feeling bad or failure about yourself  0 0 0 0 0  Trouble concentrating 0 1 0 0 0  Moving slowly or fidgety/restless 0 1 0 0 0  Suicidal thoughts 0 0 0 0 0  PHQ-9 Score 3 12 3  0 0  Difficult doing work/chores Somewhat difficult Very difficult Not difficult at all - -  Some recent data might be hidden    phq 9 is negative   Fall Risk: Fall Risk  06/18/2020 04/25/2020 02/08/2020 10/03/2019 05/30/2019  Falls in the past year? 0 0 0 0 0  Number falls in past yr: 0 0 0 0 0  Injury with Fall? 0 0 0 0 0     Functional Status Survey: Is the patient deaf or have difficulty hearing?: No Does the patient have difficulty seeing, even when wearing glasses/contacts?: No Does the patient have difficulty concentrating, remembering, or making decisions?: No Does the patient have difficulty walking or climbing stairs?: No Does the patient have difficulty dressing or bathing?: No Does the patient have difficulty doing errands alone such as visiting a doctor's office or shopping?: No   Assessment & Plan  1. Type 2 diabetes with nephropathy ([place])  - HM Diabetes Foot Exam  2. Breast cancer screening by mammogram  - MM 3D SCREEN BREAST BILATERAL; Future  3. Hypertension, benign  At goal today   4. Raynaud's disease without gangrene  Back to baseline   5. Chronic insomnia  - zolpidem (AMBIEN) 10 MG tablet; Take 1 tablet (10 mg total) by mouth at bedtime.  Dispense: 90 tablet; Refill: 0  6. Thrombocytosis  Needs to repeat labs  7. B12 deficiency  Taking supplementation   8. Vitiligo  Referral dermatologist  9. GERD without esophagitis  Continue medication   10. Pure  hypercholesterolemia  - rosuvastatin (CRESTOR) 10 MG tablet; Take 1 tablet (10 mg total) by mouth daily. In place of pravastatin  Dispense: 90 tablet; Refill: 0  11. History of TIA (transient ischemic attack)  - rosuvastatin (CRESTOR) 10 MG tablet; Take 1 tablet (10 mg total) by mouth daily. In place of pravastatin  Dispense: 90 tablet; Refill: 0  12. Migraine without aura and without status migrainosus, not intractable   13. History of bariatric surgery

## 2020-07-09 ENCOUNTER — Ambulatory Visit
Admission: RE | Admit: 2020-07-09 | Discharge: 2020-07-09 | Disposition: A | Discharge status: Home / Self Care | Payer: No Typology Code available for payment source | Source: Ambulatory Visit | Attending: Family Medicine | Admitting: Family Medicine

## 2020-07-09 ENCOUNTER — Other Ambulatory Visit: Payer: Self-pay

## 2020-07-09 DIAGNOSIS — Z1231 Encounter for screening mammogram for malignant neoplasm of breast: Secondary | ICD-10-CM | POA: Insufficient documentation

## 2020-09-02 ENCOUNTER — Ambulatory Visit: Payer: No Typology Code available for payment source | Admitting: Dermatology

## 2020-09-05 ENCOUNTER — Other Ambulatory Visit: Payer: No Typology Code available for payment source

## 2020-09-10 ENCOUNTER — Other Ambulatory Visit: Payer: Self-pay | Admitting: Neurology

## 2020-09-10 DIAGNOSIS — R519 Headache, unspecified: Secondary | ICD-10-CM | POA: Insufficient documentation

## 2020-10-21 NOTE — Progress Notes (Signed)
Name: Shannon Soto   MRN: [id number]    DOB: 1971-03-09   Date:10/22/2020       Progress Note  Subjective  Chief Complaint  Follow Up  HPI  Bariatric Surgery: she had sleeve surgery done by Dr. Hassell Done on March 14th, 2016, she has achieved her goal weight of145 lbs,today weight isup to 150 lbs, she states she is very stressed, skipping meals and not as active since COVI-19Weight prior to surgery was 242 lbsShe is doing well,hgbA1C started to go up, and we started her on Ozempic 07/2018weightwas in the 140 range until Fall 2020 when weight went up to 150 lbs on her last visit, since Feb she lost down to 137 lbs by increasing her walks, weight was stable however when admitted to [facility] for possible TIA in August she lost her appetite and weight dropped to 131 lbs. She has increased protein intake since her last visit in September, her goal weight is between 135 lbs -140 lbs, but she had COVID-11 September 2020 and lost about 5 lbs, gradually gaining it back. She has recurrent rash under breast( but that part is betterwith topical medication)and abdominal folds from the significant weight loss and has to use Nystatin prn and medication helps,she states still has burning sensation when hot and sweaty, worse during Summer months. She has not been back to Dr. Hassell Done since COVID-19 were high   Eczema: she has recurrent symptoms during winter months when skin is dry, she continues to have outbreaks and using Triamcinolone, she has an appointment coming up with Dermatologist   Vitiligo: she states continues to spread , waiting to see Dermatologist end of March   GERD: she has a long history of reflux, symptoms were controlled with Omeprazole 20 mg  BID, discussed long term use of PPI, she is afraid of going down on the dose   Hyperlipidemia: , however because she has DM,she is taking pravastatin daily .HDL was very good Reviewed last labs and LDL was 86, with a history of TIA, we changed to  Crestor back in the Fall, she would like to wait until next month for labs.   HTN: she is on Norvasc for Raynaud's and Losartan for microalbuminuria No chest pain or  dizziness. She has intermittent palpitation lasts a few seconds , not associated with nausea or diaphoresis .BP towards low end of normal but we will continue to monitor, no dizziness    Insomnia: she takes Ambien, she has been on medication for many years, she used to take high dose, now she is trying to make it last and at times takes half. She is going to bed around 11 pm. Taking half of hydroxizine around 8 pm and Ambien before bed. She states taking both has helped her gets sleep and fall asleep, she is now sleeping for about 6 hours, used to sleep only 4 hours.   DMII with peripheral neuropathy and nephropathy: off metformin since bariatric surgery in 10/2014 , no polyphagia, but has episodes of polydipsia but no polyuria  but has nocturia timesonceevery night.. On Gabapentin seems to help with neuropathy, taking one and a half 600 mg tablets at night only,She is back on Losartan , because last urine micro was  100.A1C has been within normal for a long time , she has been tolerating Ozempic, no side effects , she does not want to stop medication  However since low A1C we will go down on dose of Ozempic to 0.5 mg weekly   Major  depression:chronic symptoms since her son was born. She has beentaking Fluoxetine, one daily, occasionally takes one extra pill during the day, discussed taking hydroxizine prn for anxiety . No side effects. Denies crying spells, or anhedonia, just worries constantly about her son that has special needs .He is happy at HS. She denies suicidal thoughts or ideation   Migraines: she has tried multiple medications, but could not tolerate, episodes about1-2times months. Episodes are described as pain is behind her eyes, throbbing like, associated with nauseawhen she has a severe episode and  photophobia. It can last up to 4 hours with medication ( Tylenol ) she is off nsaid's and has been taking Nurtec prn if she takes it right away. She spoke to Dr. Melrose Nakayama when she was recovering from [place] and was having more headaches. He gave her nortriptyline but she never started taking it. Symptoms back to baseline now   Raynaud's: symptoms offingers on both hands gets white , painful and numb, not sure of the triggers at this time.  Worse this time of the year due to cold weather, it is back to baseline  History of TIA: admitted back in August 2021 with left middle finger pain, intense, seen by neurologist, questionable TIA versus severe Raynaud's , symptoms lasted days, but is pain finally resolved. She has seen Neurologist, Rheumatologist and Cardiologist   Patient Active Problem List   Diagnosis Date Noted  . Headache disorder 09/10/2020  . TIA (transient ischemic attack) 05/06/2020  . Numbness and tingling in left hand 04/16/2020  . Encounter for screening colonoscopy   . Polyp of sigmoid colon   . Controlled type 2 diabetes mellitus with neuropathy ([place]) 05/12/2016  . Raynaud's phenomenon 05/12/2016  . Intertrigo 09/06/2015  . Migraine headache without aura 02/28/2015  . History of obesity 02/28/2015  . Hyperlipidemia 02/28/2015  . GERD (gastroesophageal reflux disease) 02/28/2015  . Vitamin D deficiency 02/28/2015  . Chronic constipation 02/28/2015  . Chronic insomnia 02/28/2015  . Peripheral neuropathy 02/28/2015  . Vitiligo 02/28/2015  . Lower leg mass 02/28/2015  . Depression with anxiety 02/28/2015  . History of bariatric surgery 11/05/2014    Past Surgical History:  Procedure Laterality Date  . ABDOMINAL HYSTERECTOMY    . APPENDECTOMY    . BREAST SURGERY    . BREATH TEK H PYLORI N/A 08/27/2014   Procedure: BREATH TEK H PYLORI;  Surgeon: Pedro Earls, MD;  Location: [facility];  Service: General;  Laterality: N/A;  . CATARACT EXTRACTION W/PHACO Left  09/20/2019   Procedure: CATARACT EXTRACTION PHACO AND INTRAOCULAR LENS PLACEMENT (IOC) LEFT 0.74 00:12.3 6.0%;  Surgeon: Leandrew Koyanagi, MD;  Location: [place];  Service: Ophthalmology;  Laterality: Left;  . CATARACT EXTRACTION W/PHACO Right 10/11/2019   Procedure: CATARACT EXTRACTION PHACO AND INTRAOCULAR LENS PLACEMENT (IOC)  RIGHT 1.55 00:24.1 6.4%;  Surgeon: Leandrew Koyanagi, MD;  Location: [place];  Service: Ophthalmology;  Laterality: Right;  diabetic  . COLONOSCOPY WITH PROPOFOL N/A 02/17/2019   Procedure: COLONOSCOPY WITH PROPOFOL;  Surgeon: Lucilla Lame, MD;  Location: [place];  Service: Endoscopy;  Laterality: N/A;  . HIATAL HERNIA REPAIR  11/05/2014   Procedure: LAPAROSCOPIC REPAIR OF HIATAL HERNIA;  Surgeon: Johnathan Hausen, MD;  Location: [facility];  Service: General;;  . LAPAROSCOPIC GASTRIC SLEEVE RESECTION N/A 11/05/2014   Procedure: LAPAROSCOPIC GASTRIC SLEEVE RESECTION;  Surgeon: Johnathan Hausen, MD;  Location: [facility];  Service: General;  Laterality: N/A;  . POLYPECTOMY  02/17/2019   Procedure: POLYPECTOMY;  Surgeon: Allen Norris,  Darren, MD;  Location: [place];  Service: Endoscopy;;  . REDUCTION MAMMAPLASTY Bilateral 1997  . UPPER GI ENDOSCOPY  11/05/2014   Procedure: UPPER GI ENDOSCOPY;  Surgeon: Johnathan Hausen, MD;  Location: [facility];  Service: General;;    Family History  Problem Relation Age of Onset  . Diabetes Paternal Uncle   . Stroke Father   . Hypertension Other   . Breast cancer Maternal Grandmother 23    Social History   Tobacco Use  . Smoking status: Never Smoker  . Smokeless tobacco: Never Used  Substance Use Topics  . Alcohol use: No    Alcohol/week: 0.0 standard drinks    Comment: occasional      Current Outpatient Medications:  .  amLODipine (NORVASC) 10 MG tablet, Take 1 tablet (10 mg total) by mouth daily., Disp: 90 tablet, Rfl: 1 .  aspirin EC 81 MG tablet, , Disp: , Rfl:  .  Calcium  Carb-Cholecalciferol (CALCIUM-VITAMIN D3) 500-400 MG-UNIT TABS, Take 1 tablet by mouth 3 (three) times daily., Disp: , Rfl:  .  cyanocobalamin 500 MCG tablet, Take 1 mcg by mouth every morning., Disp: , Rfl:  .  ferrous gluconate ([employer] IRON) 240 (27 FE) MG tablet, Take 1 tablet (240 mg total) by mouth 3 (three) times daily with meals., Disp: 30 tablet, Rfl: 0 .  FLUoxetine (PROZAC) 10 MG capsule, Take 1 capsule (10 mg total) by mouth daily., Disp: 90 capsule, Rfl: 1 .  gabapentin (NEURONTIN) 600 MG tablet, Take 1.5 tablets (900 mg total) by mouth at bedtime., Disp: 135 tablet, Rfl: 1 .  hydrOXYzine (ATARAX/VISTARIL) 10 MG tablet, Take 1 tablet (10 mg total) by mouth every 8 (eight) hours as needed., Disp: 90 tablet, Rfl: 0 .  losartan (COZAAR) 100 MG tablet, Take 1 tablet (100 mg total) by mouth daily., Disp: 90 tablet, Rfl: 1 .  Multiple Vitamins-Minerals (MULTIVITAMIN ADULTS PO), Take 1 tablet by mouth daily., Disp: , Rfl:  .  nitroGLYCERIN (NITROGLYN) 2 % ointment, Apply 0.5 inches topically every 6 (six) hours., Disp: 30 g, Rfl: 0 .  nortriptyline (PAMELOR) 10 MG capsule, Start Nortriptyline (Pamelor) 10 mg nightly for one week, then increase to 20 mg nightly, Disp: , Rfl:  .  nystatin (NYSTATIN) powder, Apply topically 4 (four) times daily., Disp: 60 g, Rfl: 2 .  omeprazole (PRILOSEC) 20 MG capsule, Take 1 capsule (20 mg total) by mouth 2 (two) times daily., Disp: 180 capsule, Rfl: 1 .  ondansetron (ZOFRAN ODT) 4 MG disintegrating tablet, Take 1 tablet (4 mg total) by mouth every 8 (eight) hours as needed for nausea or vomiting., Disp: 20 tablet, Rfl: 0 .  Rimegepant Sulfate (NURTEC) 75 MG TBDP, Take 1 tablet by mouth daily as needed., Disp: 8 tablet, Rfl: 1 .  rosuvastatin (CRESTOR) 10 MG tablet, Take 1 tablet (10 mg total) by mouth daily. In place of pravastatin, Disp: 90 tablet, Rfl: 0 .  Semaglutide, 1 MG/DOSE, (OZEMPIC, 1 MG/DOSE,) 2 MG/1.5ML SOPN, Inject 0.75 mLs (1 mg total) into the skin  once a week., Disp: 3 mL, Rfl: 5 .  triamcinolone cream (KENALOG) 0.1 %, Apply topically 2 (two) times daily., Disp: 453.9 g, Rfl: 0 .  zolpidem (AMBIEN) 10 MG tablet, Take 1 tablet (10 mg total) by mouth at bedtime., Disp: 90 tablet, Rfl: 0  No Known Allergies  I personally reviewed active problem list, medication list, allergies, family history, social history, health maintenance with the patient/caregiver today.   ROS  Constitutional: Negative for  fever or significant weight change.  Respiratory: Negative for cough and shortness of breath.   Cardiovascular: Negative for chest pain or palpitations.  Gastrointestinal: Negative for abdominal pain, no bowel changes.  Musculoskeletal: Negative for gait problem or joint swelling.  Skin: Negative for rash.  Neurological: Negative for dizziness , positive for intermittent  headache.  No other specific complaints in a complete review of systems (except as listed in HPI above).  Objective  Vitals:   10/22/20 1324  BP: 118/62  Pulse: 72  Resp: 16  Temp: 97.9 F (36.6 C)  TempSrc: Oral  Weight: 131 lb 6.4 oz (59.6 kg)  Height: 5\' 2"  (1.575 m)    Body mass index is 24.03 kg/m.  Physical Exam  Constitutional: Patient appears well-developed and well-nourished. No distress.  HEENT: head atraumatic, normocephalic, pupils equal and reactive to light,  neck supple Cardiovascular: Normal rate, regular rhythm and normal heart sounds.  No murmur heard. No BLE edema. Pulmonary/Chest: Effort normal and breath sounds normal. No respiratory distress. Abdominal: Soft.  There is no tenderness. Skin: eczematous patch and also multiple vitiligo lesions  Psychiatric: Patient has a normal mood and affect. behavior is normal. Judgment and thought content normal.  Recent Results (from the past 2160 hour(s))  POCT HgB A1C     Status: None   Collection Time: 10/22/20  1:29 PM  Result Value Ref Range   Hemoglobin A1C 5.3 4.0 - 5.6 %   HbA1c POC (<>  result, manual entry)     HbA1c, POC (prediabetic range)     HbA1c, POC (controlled diabetic range)        PHQ2/9: Depression screen [facility] 2/9 10/22/2020 06/18/2020 04/25/2020 02/08/2020 10/03/2019  Decreased Interest 0 0 2 0 0  Down, Depressed, Hopeless 0 0 2 0 0  PHQ - 2 Score 0 0 4 0 0  Altered sleeping 0 1 2 3  0  Tired, decreased energy 3 1 2  0 0  Change in appetite 0 1 2 0 0  Feeling bad or failure about yourself  0 0 0 0 0  Trouble concentrating 0 0 1 0 0  Moving slowly or fidgety/restless 0 0 1 0 0  Suicidal thoughts 0 0 0 0 0  PHQ-9 Score 3 3 12 3  0  Difficult doing work/chores - Somewhat difficult Very difficult Not difficult at all -  Some recent data might be hidden    phq 9 is negative   Fall Risk: Fall Risk  10/22/2020 06/18/2020 04/25/2020 02/08/2020 10/03/2019  Falls in the past year? 0 0 0 0 0  Number falls in past yr: 0 0 0 0 0  Injury with Fall? 0 0 0 0 0     Functional Status Survey: Is the patient deaf or have difficulty hearing?: No Does the patient have difficulty seeing, even when wearing glasses/contacts?: No Does the patient have difficulty concentrating, remembering, or making decisions?: No Does the patient have difficulty walking or climbing stairs?: No Does the patient have difficulty dressing or bathing?: No Does the patient have difficulty doing errands alone such as visiting a doctor's office or shopping?: No    Assessment & Plan  1. Type 2 diabetes with nephropathy (HCC)  - POCT HgB A1C - losartan (COZAAR) 100 MG tablet; Take 1 tablet (100 mg total) by mouth daily.  Dispense: 90 tablet; Refill: 1  2. Controlled type 2 diabetes mellitus with neuropathy (HCC)  - gabapentin (NEURONTIN) 600 MG tablet; Take 1.5 tablets (900 mg total) by mouth  at bedtime.  Dispense: 135 tablet; Refill: 1  3. Pure hypercholesterolemia  - rosuvastatin (CRESTOR) 10 MG tablet; Take 1 tablet (10 mg total) by mouth daily. In place of pravastatin  Dispense: 90 tablet;  Refill: 1  4. History of TIA (transient ischemic attack)  - rosuvastatin (CRESTOR) 10 MG tablet; Take 1 tablet (10 mg total) by mouth daily. In place of pravastatin  Dispense: 90 tablet; Refill: 1  5. Chronic insomnia  - hydrOXYzine (ATARAX/VISTARIL) 10 MG tablet; Take 1 tablet (10 mg total) by mouth every 8 (eight) hours as needed.  Dispense: 90 tablet; Refill: 0 - zolpidem (AMBIEN) 10 MG tablet; Take 1 tablet (10 mg total) by mouth at bedtime.  Dispense: 90 tablet; Refill: 0  6. Migraine without aura and without status migrainosus, not intractable  - ondansetron (ZOFRAN ODT) 4 MG disintegrating tablet; Take 1 tablet (4 mg total) by mouth every 8 (eight) hours as needed for nausea or vomiting.  Dispense: 20 tablet; Refill: 0  7. GERD without esophagitis  - omeprazole (PRILOSEC) 20 MG capsule; Take 1 capsule (20 mg total) by mouth 2 (two) times daily.  Dispense: 180 capsule; Refill: 1  8. Hypertension, benign  - amLODipine (NORVASC) 10 MG tablet; Take 1 tablet (10 mg total) by mouth daily.  Dispense: 90 tablet; Refill: 1  9. Raynaud's disease without gangrene  - amLODipine (NORVASC) 10 MG tablet; Take 1 tablet (10 mg total) by mouth daily.  Dispense: 90 tablet; Refill: 1  10. Mild recurrent major depression (HCC)  - FLUoxetine (PROZAC) 10 MG capsule; Take 1 capsule (10 mg total) by mouth daily.  Dispense: 90 capsule; Refill: 1  11. Peripheral polyneuropathy  - gabapentin (NEURONTIN) 600 MG tablet; Take 1.5 tablets (900 mg total) by mouth at bedtime.  Dispense: 135 tablet; Refill: 1

## 2020-10-22 ENCOUNTER — Ambulatory Visit (INDEPENDENT_AMBULATORY_CARE_PROVIDER_SITE_OTHER): Payer: No Typology Code available for payment source | Admitting: Family Medicine

## 2020-10-22 ENCOUNTER — Other Ambulatory Visit: Payer: Self-pay

## 2020-10-22 ENCOUNTER — Encounter: Payer: Self-pay | Admitting: Family Medicine

## 2020-10-22 ENCOUNTER — Other Ambulatory Visit: Payer: Self-pay | Admitting: Family Medicine

## 2020-10-22 VITALS — BP 118/62 | HR 72 | Temp 97.9°F | Resp 16 | Ht 62.0 in | Wt 131.4 lb

## 2020-10-22 DIAGNOSIS — F330 Major depressive disorder, recurrent, mild: Secondary | ICD-10-CM

## 2020-10-22 DIAGNOSIS — G629 Polyneuropathy, unspecified: Secondary | ICD-10-CM

## 2020-10-22 DIAGNOSIS — K219 Gastro-esophageal reflux disease without esophagitis: Secondary | ICD-10-CM

## 2020-10-22 DIAGNOSIS — I10 Essential (primary) hypertension: Secondary | ICD-10-CM

## 2020-10-22 DIAGNOSIS — E1121 Type 2 diabetes mellitus with diabetic nephropathy: Secondary | ICD-10-CM | POA: Diagnosis not present

## 2020-10-22 DIAGNOSIS — E1140 Type 2 diabetes mellitus with diabetic neuropathy, unspecified: Secondary | ICD-10-CM

## 2020-10-22 DIAGNOSIS — F5104 Psychophysiologic insomnia: Secondary | ICD-10-CM

## 2020-10-22 DIAGNOSIS — E7800 Pure hypercholesterolemia, unspecified: Secondary | ICD-10-CM | POA: Diagnosis not present

## 2020-10-22 DIAGNOSIS — Z8673 Personal history of transient ischemic attack (TIA), and cerebral infarction without residual deficits: Secondary | ICD-10-CM

## 2020-10-22 DIAGNOSIS — G43009 Migraine without aura, not intractable, without status migrainosus: Secondary | ICD-10-CM

## 2020-10-22 DIAGNOSIS — I7300 Raynaud's syndrome without gangrene: Secondary | ICD-10-CM

## 2020-10-22 LAB — POCT GLYCOSYLATED HEMOGLOBIN (HGB A1C): Hemoglobin A1C: 5.3 % (ref 4.0–5.6)

## 2020-10-22 MED ORDER — FLUOXETINE HCL 10 MG PO CAPS: 10.0000 mg | ORAL_CAPSULE | Freq: Every day | ORAL | 1 refills | Status: DC

## 2020-10-22 MED ORDER — OMEPRAZOLE 20 MG PO CPDR: 20.0000 mg | DELAYED_RELEASE_CAPSULE | Freq: Two times a day (BID) | ORAL | 1 refills | Status: DC

## 2020-10-22 MED ORDER — ZOLPIDEM TARTRATE 10 MG PO TABS: 10.0000 mg | ORAL_TABLET | Freq: Every day | ORAL | 0 refills | Status: DC

## 2020-10-22 MED ORDER — HYDROXYZINE HCL 10 MG PO TABS: 10.0000 mg | ORAL_TABLET | Freq: Three times a day (TID) | ORAL | 0 refills | Status: DC | PRN

## 2020-10-22 MED ORDER — ONDANSETRON 4 MG PO TBDP: 4.0000 mg | ORAL_TABLET | Freq: Three times a day (TID) | ORAL | 0 refills | Status: DC | PRN

## 2020-10-22 MED ORDER — LOSARTAN POTASSIUM 100 MG PO TABS: 100.0000 mg | ORAL_TABLET | Freq: Every day | ORAL | 1 refills | Status: DC

## 2020-10-22 MED ORDER — AMLODIPINE BESYLATE 10 MG PO TABS: 10.0000 mg | ORAL_TABLET | Freq: Every day | ORAL | 1 refills | Status: DC

## 2020-10-22 MED ORDER — ROSUVASTATIN CALCIUM 10 MG PO TABS: 10.0000 mg | ORAL_TABLET | Freq: Every day | ORAL | 1 refills | Status: DC

## 2020-10-22 MED ORDER — OZEMPIC (0.25 OR 0.5 MG/DOSE) 2 MG/1.5ML ~~LOC~~ SOPN: 0.5000 mg | PEN_INJECTOR | SUBCUTANEOUS | 3 refills | Status: DC

## 2020-10-22 MED ORDER — GABAPENTIN 600 MG PO TABS: 900.0000 mg | ORAL_TABLET | Freq: Every day | ORAL | 1 refills | Status: DC

## 2020-11-04 ENCOUNTER — Encounter: Payer: Self-pay | Admitting: Family Medicine

## 2020-11-19 ENCOUNTER — Other Ambulatory Visit: Payer: Self-pay

## 2020-11-19 ENCOUNTER — Ambulatory Visit: Payer: No Typology Code available for payment source | Admitting: Dermatology

## 2020-11-19 ENCOUNTER — Other Ambulatory Visit: Payer: Self-pay | Admitting: Dermatology

## 2020-11-19 DIAGNOSIS — L80 Vitiligo: Secondary | ICD-10-CM

## 2020-11-19 DIAGNOSIS — L304 Erythema intertrigo: Secondary | ICD-10-CM

## 2020-11-19 DIAGNOSIS — L309 Dermatitis, unspecified: Secondary | ICD-10-CM

## 2020-11-19 MED ORDER — OPZELURA 1.5 % EX CREA: 1.0000 "application " | TOPICAL_CREAM | Freq: Every day | CUTANEOUS | 3 refills | Status: DC

## 2020-11-19 MED ORDER — KETOCONAZOLE 2 % EX CREA: 1.0000 "application " | TOPICAL_CREAM | Freq: Two times a day (BID) | CUTANEOUS | 3 refills | Status: DC

## 2020-11-19 MED ORDER — FLUOCINOLONE ACETONIDE BODY 0.01 % EX OIL: TOPICAL_OIL | CUTANEOUS | 3 refills | Status: DC

## 2020-11-19 NOTE — Patient Instructions (Addendum)
Gentle Skin Care Guide  1. Bathe no more than once a day.  2. Avoid bathing in hot water  3. Use a mild soap like Dove, Vanicream, Cetaphil, CeraVe. Can use Lever 2000 or Cetaphil antibacterial soap  4. Use soap only where you need it. On most days, use it under your arms, between your legs, and on your feet. Let the water rinse other areas unless visibly dirty.  5. When you get out of the bath/shower, use a towel to gently blot your skin dry, don't rub it.  6. While your skin is still a little damp, apply a moisturizing cream such as Vanicream, CeraVe, Cetaphil, Eucerin, Sarna lotion or plain Vaseline Jelly. For hands apply Neutrogena [place] Hand Cream or Excipial Hand Cream.  7. Reapply moisturizer any time you start to itch or feel dry.  8. Sometimes using free and clear laundry detergents can be helpful. Fabric softener sheets should be avoided. Downy Free & Gentle liquid, or any liquid fabric softener that is free of dyes and perfumes, it acceptable to use  9. If your doctor has given you prescription creams you may apply moisturizers over them      Atopic Dermatitis  Atopic dermatitis (eczema) is a chronic, relapsing, pruritic condition that can significantly affect quality of life. It is often associated with allergic rhinitis and/or asthma and can require treatment with topical medications, phototherapy, or in severe cases a biologic medication called Dupixent in older children and adults.   Vitiligo Reviewed chronic nature, no cure and can be difficult to treat.  Vitiligo is an autoimmune condition which causes loss of skin pigment and is commonly seen on the face and may also involve areas of trauma like hands, elbows, knees, and ankles.  Treatments include topical steroids and other topical anti-inflammatory ointments/creams.  Sometimes narrow band UV light therapy or Xtrac laser is helpful, both of which require twice weekly treatments for at least 3-6 months.  Antioxidant  vitamins, such as Vitamins C&E, and alpha lipoic acid may be added to enhance treatment.  If you have any questions or concerns for your doctor, please call our main line at [phone] and press option 4 to reach your doctor's medical assistant. If no one answers, please leave a voicemail as directed and we will return your call as soon as possible. Messages left after 4 pm will be answered the following business day.   You may also send [place] a message via [place]. We typically respond to MyChart messages within 1-2 business days.  For prescription refills, please ask your pharmacy to contact our office. Our fax number is [phone].  If you have an urgent issue when the clinic is closed that cannot wait until the next business day, you can page your doctor at the number below.    Please note that while we do our best to be available for urgent issues outside of office hours, we are not available 24/7.   If you have an urgent issue and are unable to reach [place], you may choose to seek medical care at your doctor's office, retail clinic, urgent care center, or emergency room.  If you have a medical emergency, please immediately call 911 or go to the emergency department.  Pager Numbers  - Dr. Nehemiah Massed: [phone]  - Dr. Laurence Ferrari: [phone]  - Dr. Nicole Kindred: [phone]  In the event of inclement weather, please call our main line at [phone] for an update on the status of any delays or closures.  Dermatology Medication  Tips: Please keep the boxes that topical medications come in in order to help keep track of the instructions about where and how to use these. Pharmacies typically print the medication instructions only on the boxes and not directly on the medication tubes.   If your medication is too expensive, please contact our office at [phone] option 4 or send [place] a message through [place].   We are unable to tell what your co-pay for medications will be in advance as this is  different depending on your insurance coverage. However, we may be able to find a substitute medication at lower cost or fill out paperwork to get insurance to cover a needed medication.   If a prior authorization is required to get your medication covered by your insurance company, please allow [place] 1-2 business days to complete this process.  Drug prices often vary depending on where the prescription is filled and some pharmacies may offer cheaper prices.  The website www.goodrx.com contains coupons for medications through different pharmacies. The prices here do not account for what the cost may be with help from insurance (it may be cheaper with your insurance), but the website can give you the price if you did not use any insurance.  - You can print the associated coupon and take it with your prescription to the pharmacy.  - You may also stop by our office during regular business hours and pick up a GoodRx coupon card.  - If you need your prescription sent electronically to a different pharmacy, notify our office through [facility] or by phone at [phone] option 4.

## 2020-11-19 NOTE — Progress Notes (Signed)
Follow-Up Visit   Subjective  Shannon Soto is a 50 y.o. female who presents for the following: follow up (Patient here today for concerns with vitiligo on face, hands, arms and eczema inside arms, inside and leg, in folds of stomach, and under breast. She currently using nystatin powder to help.  Patient states she has been dealing with 3 - 4 years. Patient is currently using triamcinolone cream for eczema. Patient states when she was seen 3 years ago she was prescribed clobetasol but was unable to use because it would burn when applying. ).  Patient states she has a long history of eczema and has had since she was a child.   The following portions of the chart were reviewed this encounter and updated as appropriate:      Objective  Well appearing patient in no apparent distress; mood and affect are within normal limits.  A focused examination was performed including face, abdomen, bilateral arms, bilateral hands, right breast. Relevant physical exam findings are noted in the Assessment and Plan.  Objective  periocular, perioral, bilateral arms,  bilateral elbows, bilateral forearms, and right breast: Dyspigmented patches on periocular, perioral, bilateral arms,  bilateral elbows, bilateral forearms, and right breast   Images            Objective  Right medial breast: Dyspigmented scaly patches on right medial breast   Objective  bilateral inguinal folds: Mild erythema on bilateral inguinal folds   Assessment & Plan  Vitiligo periocular, perioral, bilateral arms,  bilateral elbows, bilateral forearms, and right breast  Reviewed chronic nature, no cure and can be difficult to treat.  Vitiligo is an autoimmune condition which causes loss of skin pigment and is commonly seen on the face and may also involve areas of trauma like hands, elbows, knees, and ankles.  Treatments include topical steroids and other topical anti-inflammatory ointments/creams.  Sometimes narrow  band UV light therapy or Xtrac laser is helpful, both of which require twice weekly treatments for at least 3-6 months.  Antioxidant vitamins, such as Vitamins C&E, and alpha lipoic acid may be added to enhance treatment.  Start Opzelura 1.5 % cream apply to affected areas twice daily for eczema and vitiligo. 60 g 3 rf sent to [place]       Eczema, unspecified type Right medial breast  Probable Atopic- with flare R breast  Atopic dermatitis (eczema) is a chronic, relapsing, pruritic condition that can significantly affect quality of life. It is often associated with allergic rhinitis and/or asthma and can require treatment with topical medications, phototherapy, or in severe cases a biologic medication called Dupixent.   Start Fluocinolone Acetonide Body 0.01% oil apply topically to damp skin after shower. 118.28 ml 3 rf sent to [facility] 1.5 % cream apply to affected areas twice daily for eczema and vitiligo. 60 g 3 rf sent to [facility]          Ruxolitinib Phosphate (OPZELURA) 1.5 % CREA - Right medial breast  Fluocinolone Acetonide Body 0.01 % OIL - Right medial breast  Erythema intertrigo bilateral inguinal folds  Start Ketoconazole 2 % cream apply bid to affected area until clear.    Continue nystatin powder or Zeasorb AF powder (OTC)   Intertrigo is a chronic recurrent rash that occurs in skin fold areas that may be associated with friction; heat; moisture; yeast; fungus; and bacteria.  It is exacerbated by increased movement / activity; sweating; and higher atmospheric temperature.  Ordered Medications: ketoconazole (NIZORAL) 2 % cream  Return in about 2 months (around 01/19/2021) for eczema and vitiligo follow up.   I, Ruthell Rummage, CMA, am acting as scribe for Brendolyn Patty, MD.  Documentation: I have reviewed the above documentation for accuracy and completeness, and I agree with the above.  Brendolyn Patty MD

## 2020-11-20 ENCOUNTER — Telehealth: Payer: Self-pay

## 2020-11-20 NOTE — Telephone Encounter (Signed)
Patient advised to use Opzelura twice daily instead of once daily. RX sent incorrectly yesterday.

## 2020-12-04 MED FILL — Semaglutide Soln Pen-inj 0.25 or 0.5 MG/DOSE (2 MG/1.5ML): SUBCUTANEOUS | 28 days supply | Qty: 1.5 | Fill #0 | Status: AC

## 2020-12-05 ENCOUNTER — Other Ambulatory Visit: Payer: Self-pay

## 2021-01-03 MED FILL — Semaglutide Soln Pen-inj 0.25 or 0.5 MG/DOSE (2 MG/1.5ML): SUBCUTANEOUS | 28 days supply | Qty: 1.5 | Fill #1 | Status: AC

## 2021-01-06 ENCOUNTER — Other Ambulatory Visit: Payer: Self-pay

## 2021-01-21 ENCOUNTER — Other Ambulatory Visit: Payer: Self-pay

## 2021-01-21 ENCOUNTER — Ambulatory Visit (INDEPENDENT_AMBULATORY_CARE_PROVIDER_SITE_OTHER): Payer: No Typology Code available for payment source | Admitting: Dermatology

## 2021-01-21 DIAGNOSIS — L309 Dermatitis, unspecified: Secondary | ICD-10-CM

## 2021-01-21 DIAGNOSIS — L304 Erythema intertrigo: Secondary | ICD-10-CM

## 2021-01-21 DIAGNOSIS — L80 Vitiligo: Secondary | ICD-10-CM

## 2021-01-21 MED ORDER — HALOBETASOL PROPIONATE 0.05 % EX CREA
TOPICAL_CREAM | CUTANEOUS | 1 refills | Status: AC
  Filled 2021-01-21: qty 50, 10d supply, fill #0

## 2021-01-21 NOTE — Progress Notes (Signed)
   Follow-Up Visit   Subjective  Shannon Soto is a 50 y.o. female who presents for the following: Vitiligo (Face, arms, R breast, Opzelura cr qd/bid, some improvement on L hand and over R brow), Eczema (R breast, 22m f/u, Opzeulura qd/bid, fluocinolone oil qd), and intertrigo (Bil inguinal folds, Ketoconazole 2% cr prn, Nystatin powder).   The following portions of the chart were reviewed this encounter and updated as appropriate:       Review of Systems:  No other skin or systemic complaints except as noted in HPI or Assessment and Plan.  Objective  Well appearing patient in no apparent distress; mood and affect are within normal limits.  A focused examination was performed including face, hands, arms, . Relevant physical exam findings are noted in the Assessment and Plan.  Objective  arms, legs: Pink brown patches lower legs, arms  Objective  face, bil arms, bil hands, R breast: Dyspigmented patches face, arms, elbows, hands, some areas with focal repigmentation when compared to photos  Objective  bil inguinal folds: Mild erythema   Assessment & Plan  Eczema, unspecified type arms, legs  Improving Cont Fluocinoline oil qd after shower aa body Cont Opzelura cr qd/bid aa eczema until clear, then prn flares  Other Related Medications Ruxolitinib Phosphate (OPZELURA) 1.5 % CREA Fluocinolone Acetonide Body 0.01 % OIL  Vitiligo face, bil arms, bil hands, R breast  Reviewed chronic nature, no cure and can be difficult to treat.  Vitiligo is an autoimmune condition which causes loss of skin pigment and is commonly seen on the face and may also involve areas of trauma like hands, elbows, knees, and ankles.  Treatments include topical steroids and other topical anti-inflammatory ointments/creams.  Sometimes narrow band UV light therapy or Xtrac laser is helpful, both of which require twice weekly treatments for at least 3-6 months.  Antioxidant vitamins, such as Vitamins  C&E, and alpha lipoic acid may be added to enhance treatment.   Some improvement when compared to photos Check thyroid panel/Abs  Cont Opzelura cr qd/bid aa face, arms, hands, elbows Start halobetasol cr hs 5d/wk to aa hands, arms, elbows avoid f/g/a  halobetasol (ULTRAVATE) 0.05 % cream - face, bil arms, bil hands, R breast  Other Related Procedures Thyroid Peroxidase Antibody Thyroid Panel With TSH  Intertrigo bil inguinal folds  Improved Intertrigo is a chronic recurrent rash that occurs in skin fold areas that may be associated with friction; heat; moisture; yeast; fungus; and bacteria.  It is exacerbated by increased movement / activity; sweating; and higher atmospheric temperature.   Cont Nystatin powder qd as preventative Cont Ketoconazole 2% cr qd/bid prn flares  Other Related Medications nystatin (NYSTATIN) powder  Return in about 3 months (around 04/23/2021) for Vitiligo, Eczema, Intertrigo.  I, Othelia Pulling, RMA, am acting as scribe for Brendolyn Patty, MD .  Documentation: I have reviewed the above documentation for accuracy and completeness, and I agree with the above.  Brendolyn Patty MD

## 2021-01-21 NOTE — Patient Instructions (Signed)

## 2021-01-22 ENCOUNTER — Other Ambulatory Visit: Payer: Self-pay

## 2021-02-02 MED FILL — Semaglutide Soln Pen-inj 0.25 or 0.5 MG/DOSE (2 MG/1.5ML): SUBCUTANEOUS | 28 days supply | Qty: 1.5 | Fill #2 | Status: AC

## 2021-02-03 ENCOUNTER — Other Ambulatory Visit: Payer: Self-pay

## 2021-02-04 ENCOUNTER — Other Ambulatory Visit: Payer: Self-pay

## 2021-02-12 ENCOUNTER — Telehealth: Payer: Self-pay

## 2021-02-12 LAB — THYROID PANEL WITH TSH
Free Thyroxine Index: 1.3 (ref 1.2–4.9)
T3 Uptake Ratio: 19 % — ABNORMAL LOW (ref 24–39)
T4, Total: 7.1 ug/dL (ref 4.5–12.0)
TSH: 1.29 u[IU]/mL (ref 0.450–4.500)

## 2021-02-12 LAB — THYROID PEROXIDASE ANTIBODY: Thyroperoxidase Ab SerPl-aCnc: <8 IU/mL (ref 0–34)

## 2021-02-12 NOTE — Telephone Encounter (Signed)
Lft pt msg to call for lab results/sh

## 2021-02-12 NOTE — Telephone Encounter (Signed)
-----   Message from Brendolyn Patty, MD sent at 02/12/2021 12:18 PM EDT ----- Thyroid panel and Abs are normal - please call patient

## 2021-02-18 ENCOUNTER — Encounter: Payer: No Typology Code available for payment source | Admitting: Family Medicine

## 2021-02-19 ENCOUNTER — Telehealth: Payer: Self-pay

## 2021-02-19 NOTE — Telephone Encounter (Signed)
Advised pt of bx results/sh ?

## 2021-02-19 NOTE — Telephone Encounter (Signed)
-----   Message from Brendolyn Patty, MD sent at 02/12/2021 12:18 PM EDT ----- Thyroid panel and Abs are normal - please call patient

## 2021-03-04 ENCOUNTER — Other Ambulatory Visit: Payer: Self-pay

## 2021-03-04 ENCOUNTER — Encounter: Payer: Self-pay | Admitting: Family Medicine

## 2021-03-04 MED FILL — Rosuvastatin Calcium Tab 10 MG: ORAL | 90 days supply | Qty: 90 | Fill #0 | Status: AC

## 2021-03-04 MED FILL — Gabapentin Tab 600 MG: ORAL | 90 days supply | Qty: 135 | Fill #0 | Status: AC

## 2021-03-04 MED FILL — Omeprazole Cap Delayed Release 20 MG: ORAL | 90 days supply | Qty: 180 | Fill #0 | Status: AC

## 2021-03-04 MED FILL — Fluoxetine HCl Cap 10 MG: ORAL | 90 days supply | Qty: 90 | Fill #0 | Status: AC

## 2021-03-04 MED FILL — Amlodipine Besylate Tab 10 MG (Base Equivalent): ORAL | 90 days supply | Qty: 90 | Fill #0 | Status: AC

## 2021-03-04 MED FILL — Losartan Potassium Tab 100 MG: ORAL | 90 days supply | Qty: 90 | Fill #0 | Status: AC

## 2021-03-05 ENCOUNTER — Other Ambulatory Visit: Payer: Self-pay

## 2021-03-05 DIAGNOSIS — G43009 Migraine without aura, not intractable, without status migrainosus: Secondary | ICD-10-CM

## 2021-03-05 DIAGNOSIS — F5104 Psychophysiologic insomnia: Secondary | ICD-10-CM

## 2021-03-05 MED ORDER — SEMAGLUTIDE(0.25 OR 0.5MG/DOS) 2 MG/1.5ML ~~LOC~~ SOPN
0.5000 mg | PEN_INJECTOR | SUBCUTANEOUS | 3 refills | Status: DC
  Filled 2021-03-05: qty 1.5, 28d supply, fill #0
  Filled 2021-04-08: qty 1.5, 28d supply, fill #1
  Filled 2021-05-05: qty 1.5, 28d supply, fill #2
  Filled 2021-06-05: qty 1.5, 28d supply, fill #3

## 2021-03-05 MED ORDER — ZOLPIDEM TARTRATE 10 MG PO TABS
ORAL_TABLET | Freq: Every day | ORAL | 0 refills | Status: DC
  Filled 2021-03-05: qty 90, 90d supply, fill #0

## 2021-03-05 MED ORDER — NURTEC 75 MG PO TBDP
1.0000 | ORAL_TABLET | Freq: Every day | ORAL | 1 refills | Status: DC | PRN
Start: 2021-03-05 — End: 2021-10-16

## 2021-03-05 MED ORDER — HYDROXYZINE HCL 10 MG PO TABS
ORAL_TABLET | Freq: Three times a day (TID) | ORAL | 0 refills | Status: DC | PRN
  Filled 2021-03-05: qty 90, 30d supply, fill #0

## 2021-03-05 MED ORDER — ONDANSETRON 4 MG PO TBDP
ORAL_TABLET | Freq: Three times a day (TID) | ORAL | 0 refills | Status: DC | PRN
  Filled 2021-03-05: qty 20, 7d supply, fill #0

## 2021-03-10 NOTE — Progress Notes (Signed)
Name: Shannon Soto   MRN: [id number]    DOB: [birth year]   Date:03/11/2021       Progress Note  Subjective  Chief Complaint  Follow Up  HPI  Bariatric Surgery: she had sleeve surgery done by Dr. Hassell Done on March 14th, 2016, she has achieved her goal weight of 145 lbs, today weight is up to 150 lbs, she states she is very stressed, skipping meals and not as active since COVI-19  Weight prior to surgery was 242 lbs She is doing well, hgbA1C started to go up, and we started her on Ozempic 02/2017 weight was in the 140 range until Fall 2020 when weight went up to 150 lbs on her last visit, since Feb she lost down to 137 lbs by increasing her walks, weight was stable however when admitted to [facility] for possible TIA in August she lost her appetite and weight dropped to 131 lbs. She has increased protein intake since her last visit in September, her goal weight is between 135 lbs -140 lbs, but she had COVID-11 September 2020 and lost about 5 lbs, gradually gaining it back.  She has recurrent rash under breast ( but that part is better with topical medication) and abdominal folds from the significant weight loss and has to use Nystatin prn and medication helps, she states still has burning sensation when hot and sweaty, worse during Summer months. She has not been back to Dr. Hassell Done since COVID-19 were high    Eczema: she has recurrent symptoms during winter months when skin is dry, she continues to have outbreaks and using Triamcinolone, she also has intertrigo and uses the nystatin powder for intertrigo and is stable on medication   Vitiligo: she states continues to spread , seen by Dr. Nicole Kindred, using a new topical medication but some of the spots are getting darker, but down to Opzelura once a day instead of BID . No side effects of medication    GERD: she has a long history of reflux, symptoms were controlled with Omeprazole 20 mg  BID, discussed long term use of PPI. We have discussed weaning off but she  is not ready for it    Hyperlipidemia: , however because she has DM, she is taking pravastatin daily . HDL was very good  Reviewed last labs and LDL was 86, with a history of TIA, we changed to Crestor back in the Fall, she is due for labs and will return soon to have it done    HTN: she is on Norvasc for Raynaud's and Losartan for microalbuminuria No chest pain or  dizziness. She has intermittent palpitation lasts a few seconds , not associated with nausea or diaphoresis .BP towards low end of normal , she denies orthostatic hypotension    Insomnia: she takes Ambien, she has been on medication for many years, she used to take high dose, now she is trying to make it last and at times takes half . She is going to bed around 11 pm. Taking half of hydroxizine around 8 pm and Ambien before bed. She states taking both has helped her gets sleep and fall asleep, she was sleeping around 6 hours per night.    DMII with peripheral neuropathy and nephropathy:  off metformin since bariatric surgery in 10/2014 , no polyphagia, but has episodes of polydipsia but no polyuria  but has nocturia times once  every night.. On Gabapentin seems to help with neuropathy, taking one and a half 600 mg tablets at night  only,  She is back on Losartan , because last urine micro was  100. A1C has been within normal for a long time , she has been tolerating Ozempic, no side effects , she does not want to stop medication  We went down on the dose on her last visit and she gained 9 lbs but we will not go up since A1C is 5.1% 140 lbs is her goal weight   Major depression: chronic symptoms since her son was born. She has been taking Fluoxetine, one daily, occasionally takes one extra pill during the day, discussed taking hydroxizine prn for anxiety . No side effects. Denies crying spells, or anhedonia, just worries constantly about her son that has special needs . He is happy at HS. She denies suicidal thoughts or ideation . Continue  current regiment . She has a good support system   Migraines: she has tried multiple medications, but could not tolerate,  episodes about 1-2 times months. Episodes are described as  pain is behind her eyes, throbbing like, associated with nausea when she has a severe episode and photophobia. It can last up to 4 hours with medication ( Tylenol  ) she is off nsaid's and has been taking Nurtec prn if she takes it right away. She spoke to Dr. Melrose Nakayama when she was recovering from [place] and was having more headaches. He gave her nortriptyline but it has been stopped since her last visit with me    Raynaud's: symptoms of  fingers on both hands gets white , painful and numb, not sure of the triggers at this time.  Worse this time of the year due to cold weather, doing well at this time  History of TIA: admitted back in August 2021 with left middle finger pain, intense, seen by neurologist, questionable TIA versus severe Raynaud's , symptoms lasted days, but is pain finally resolved. She has seen Neurologist, Rheumatologist and Cardiologist . Unchanged   Ovarian cyst: left side, went to see gyn due to severe lower abdominal pain, had pelvic US and labs , US showed simple left ovarian cyst and was given reassurance   Patient Active Problem List   Diagnosis Date Noted   Headache disorder 09/10/2020   TIA (transient ischemic attack) 05/06/2020   Numbness and tingling in left hand 04/16/2020   Encounter for screening colonoscopy    Polyp of sigmoid colon    Controlled type 2 diabetes mellitus with neuropathy ([place]) 05/12/2016   Raynaud's phenomenon 05/12/2016   Intertrigo 09/06/2015   Migraine headache without aura 02/28/2015   History of obesity 02/28/2015   Hyperlipidemia 02/28/2015   GERD (gastroesophageal reflux disease) 02/28/2015   Vitamin D deficiency 02/28/2015   Chronic constipation 02/28/2015   Chronic insomnia 02/28/2015   Peripheral neuropathy 02/28/2015   Vitiligo 02/28/2015   Lower leg  mass 02/28/2015   Depression with anxiety 02/28/2015   History of bariatric surgery 11/05/2014    Past Surgical History:  Procedure Laterality Date   ABDOMINAL HYSTERECTOMY     APPENDECTOMY     BREAST SURGERY     BREATH TEK H PYLORI N/A 08/27/2014   Procedure: BREATH TEK H PYLORI;  Surgeon: Pedro Earls, MD;  Location: [facility];  Service: General;  Laterality: N/A;   CATARACT EXTRACTION W/PHACO Left 09/20/2019   Procedure: CATARACT EXTRACTION PHACO AND INTRAOCULAR LENS PLACEMENT (IOC) LEFT 0.74 00:12.3 6.0%;  Surgeon: Leandrew Koyanagi, MD;  Location: [place];  Service: Ophthalmology;  Laterality: Left;   CATARACT EXTRACTION W/PHACO Right  10/11/2019   Procedure: CATARACT EXTRACTION PHACO AND INTRAOCULAR LENS PLACEMENT (IOC)  RIGHT 1.55 00:24.1 6.4%;  Surgeon: Leandrew Koyanagi, MD;  Location: [place];  Service: Ophthalmology;  Laterality: Right;  diabetic   COLONOSCOPY WITH PROPOFOL N/A 02/17/2019   Procedure: COLONOSCOPY WITH PROPOFOL;  Surgeon: Lucilla Lame, MD;  Location: [place];  Service: Endoscopy;  Laterality: N/A;   HIATAL HERNIA REPAIR  11/05/2014   Procedure: LAPAROSCOPIC REPAIR OF HIATAL HERNIA;  Surgeon: Johnathan Hausen, MD;  Location: [facility];  Service: General;;   LAPAROSCOPIC GASTRIC SLEEVE RESECTION N/A 11/05/2014   Procedure: LAPAROSCOPIC GASTRIC SLEEVE RESECTION;  Surgeon: Johnathan Hausen, MD;  Location: [facility];  Service: General;  Laterality: N/A;   POLYPECTOMY  02/17/2019   Procedure: POLYPECTOMY;  Surgeon: Lucilla Lame, MD;  Location: [place];  Service: Endoscopy;;   REDUCTION MAMMAPLASTY Bilateral 1997   UPPER GI ENDOSCOPY  11/05/2014   Procedure: UPPER GI ENDOSCOPY;  Surgeon: Johnathan Hausen, MD;  Location: [facility];  Service: General;;    Family History  Problem Relation Age of Onset   Diabetes Paternal Uncle    Stroke Father    Hypertension Other    Breast cancer Maternal Grandmother 72    Social History    Tobacco Use   Smoking status: Never   Smokeless tobacco: Never  Substance Use Topics   Alcohol use: No    Alcohol/week: 0.0 standard drinks    Comment: occasional      Current Outpatient Medications:    amLODipine (NORVASC) 10 MG tablet, TAKE 1 TABLET BY MOUTH DAILY., Disp: 90 tablet, Rfl: 1   aspirin EC 81 MG tablet, , Disp: , Rfl:    Calcium Carb-Cholecalciferol (CALCIUM-VITAMIN D3) 500-400 MG-UNIT TABS, Take 1 tablet by mouth 3 (three) times daily., Disp: , Rfl:    cyanocobalamin 500 MCG tablet, Take 1 mcg by mouth every morning., Disp: , Rfl:    ferrous gluconate ([employer] IRON) 240 (27 FE) MG tablet, Take 1 tablet (240 mg total) by mouth 3 (three) times daily with meals., Disp: 30 tablet, Rfl: 0   Fluocinolone Acetonide Body 0.01 % OIL, Apply 1 application topically to damp skin after shower daily, Disp: 118.28 mL, Rfl: 3   FLUoxetine (PROZAC) 10 MG capsule, TAKE 1 CAPSULE BY MOUTH DAILY., Disp: 90 capsule, Rfl: 1   gabapentin (NEURONTIN) 600 MG tablet, TAKE 1 & 1/2 TABLETS (900MG ) BY MOUTH AT BEDTIME, Disp: 135 tablet, Rfl: 1   hydrOXYzine (ATARAX/VISTARIL) 10 MG tablet, TAKE 1 TABLET BY MOUTH EVERY 8 HOURS AS NEEDED, Disp: 90 tablet, Rfl: 0   ketoconazole (NIZORAL) 2 % cream, APPLY TO THE AFFECTED AREA(S) TWO TIMES DAILY UNTIL CLEAR, Disp: 60 g, Rfl: 3   losartan (COZAAR) 100 MG tablet, TAKE 1 TABLET (100 MG TOTAL) BY MOUTH DAILY., Disp: 90 tablet, Rfl: 1   Multiple Vitamins-Minerals (MULTIVITAMIN ADULTS PO), Take 1 tablet by mouth daily., Disp: , Rfl:    nitroGLYCERIN (NITROGLYN) 2 % ointment, Apply 0.5 inches topically every 6 (six) hours., Disp: 30 g, Rfl: 0   omeprazole (PRILOSEC) 20 MG capsule, TAKE 1 CAPSULE BY MOUTH TWICE DAILY, Disp: 180 capsule, Rfl: 1   ondansetron (ZOFRAN-ODT) 4 MG disintegrating tablet, TAKE 1 TABLET BY MOUTH EVERY 8 HOURS AS NEEDED FOR NAUSEA OR VOMITING, Disp: 20 tablet, Rfl: 0   Rimegepant Sulfate (NURTEC) 75 MG TBDP, Take 1 tablet by mouth daily as  needed., Disp: 8 tablet, Rfl: 1   rosuvastatin (CRESTOR) 10 MG tablet, TAKE 1 TABLET  BY MOUTH DAILY IN PLACE OF PRAVASTATIN, Disp: 90 tablet, Rfl: 1   Ruxolitinib Phosphate (OPZELURA) 1.5 % CREA, Apply 1 each topically at bedtime., Disp: , Rfl:    Semaglutide,0.25 or 0.5MG /DOS, 2 MG/1.5ML SOPN, INJECT 0.5 MG INTO THE SKIN ONCE A WEEK., Disp: 1.5 mL, Rfl: 3   triamcinolone cream (KENALOG) 0.1 %, Apply topically 2 (two) times daily., Disp: 453.9 g, Rfl: 0   zolpidem (AMBIEN) 10 MG tablet, TAKE 1 TABLET BY MOUTH AT BEDTIME., Disp: 90 tablet, Rfl: 0   nystatin powder, Apply topically 4 (four) times daily., Disp: 60 g, Rfl: 2  No Known Allergies  I personally reviewed active problem list, medication list, allergies, family history, social history, health maintenance with the patient/caregiver today.   ROS  Constitutional: Negative for fever , positive for  weight change.  Respiratory: Negative for cough and shortness of breath.   Cardiovascular: Negative for chest pain or palpitations.  Gastrointestinal: Negative for abdominal pain, no bowel changes.  Musculoskeletal: Negative for gait problem or joint swelling.  Skin: Negative for rash.  Neurological: Negative for dizziness , positive for intermittent  headache.  No other specific complaints in a complete review of systems (except as listed in HPI above).   Objective  Vitals:   03/11/21 1320  BP: 116/70  Pulse: 60  Resp: 16  Temp: 98.1 F (36.7 C)  TempSrc: Oral  SpO2: 95%  Weight: 140 lb (63.5 kg)  Height: 5\' 2"  (1.575 m)    Body mass index is 25.61 kg/m.  Physical Exam  Constitutional: Patient appears well-developed and well-nourished.  No distress.  HEENT: head atraumatic, normocephalic, pupils equal and reactive to light, neck supple Cardiovascular: Normal rate, regular rhythm and normal heart sounds.  No murmur heard. No BLE edema. Pulmonary/Chest: Effort normal and breath sounds normal. No respiratory  distress. Abdominal: Soft.  There is no tenderness. Psychiatric: Patient has a normal mood and affect. behavior is normal. Judgment and thought content normal.  Skin: vitiligo   Recent Results (from the past 2160 hour(s))  Thyroid Peroxidase Antibody     Status: None   Collection Time: 02/11/21  1:45 PM  Result Value Ref Range   Thyroperoxidase Ab SerPl-aCnc <8 0 - 34 IU/mL  Thyroid Panel With TSH     Status: Abnormal   Collection Time: 02/11/21  1:45 PM  Result Value Ref Range   TSH 1.290 0.450 - 4.500 uIU/mL   T4, Total 7.1 4.5 - 12.0 ug/dL   T3 Uptake Ratio 19 (L) 24 - 39 %   Free Thyroxine Index 1.3 1.2 - 4.9  POCT HgB A1C     Status: None   Collection Time: 03/11/21  1:28 PM  Result Value Ref Range   Hemoglobin A1C 5.1 4.0 - 5.6 %   HbA1c POC (<> result, manual entry)     HbA1c, POC (prediabetic range)     HbA1c, POC (controlled diabetic range)       PHQ2/9: Depression screen [facility] 2/9 03/11/2021 10/22/2020 06/18/2020 04/25/2020 02/08/2020  Decreased Interest 1 0 0 2 0  Down, Depressed, Hopeless 0 0 0 2 0  PHQ - 2 Score 1 0 0 4 0  Altered sleeping 0 0 1 2 3   Tired, decreased energy 1 3 1 2  0  Change in appetite 0 0 1 2 0  Feeling bad or failure about yourself  0 0 0 0 0  Trouble concentrating 0 0 0 1 0  Moving slowly or fidgety/restless 0 0 0 1  0  Suicidal thoughts 0 0 0 0 0  PHQ-9 Score 2 3 3 12 3   Difficult doing work/chores - - Somewhat difficult Very difficult Not difficult at all  Some recent data might be hidden    phq 9 is positive   Fall Risk: Fall Risk  03/11/2021 10/22/2020 06/18/2020 04/25/2020 02/08/2020  Falls in the past year? 0 0 0 0 0  Number falls in past yr: 0 0 0 0 0  Injury with Fall? 0 0 0 0 0  Follow up Falls prevention discussed - - - -     Functional Status Survey: Is the patient deaf or have difficulty hearing?: No Does the patient have difficulty seeing, even when wearing glasses/contacts?: No Does the patient have difficulty concentrating,  remembering, or making decisions?: No Does the patient have difficulty walking or climbing stairs?: No Does the patient have difficulty dressing or bathing?: No Does the patient have difficulty doing errands alone such as visiting a doctor's office or shopping?: No   Assessment & Plan  1. Type 2 diabetes with nephropathy (HCC)  - POCT HgB A1C - Microalbumin / creatinine urine ratio  2. Intertrigo  - nystatin powder; Apply topically 4 (four) times daily.  Dispense: 60 g; Refill: 2  3. Thrombocytosis   4. GERD without esophagitis   5. Mild recurrent major depression ([place])   6. History of TIA (transient ischemic attack)   7. Controlled type 2 diabetes mellitus with neuropathy ([place])   8. Pure hypercholesterolemia  - Lipid panel  9. Migraine without aura and without status migrainosus, not intractable   10. Chronic insomnia   11. Hypertension, benign  - CBC with Differential/Platelet - COMPLETE METABOLIC PANEL WITH GFR  12. Raynaud's disease without gangrene   13. Peripheral polyneuropathy   14. B12 deficiency  - Vitamin B12  15. Vitiligo   16. Vitamin D deficiency  - VITAMIN D 25 Hydroxy (Vit-D Deficiency, Fractures)  17. Other eczema   18. History of bariatric surgery   19. Need for hepatitis C screening test  - Hepatitis C antibody

## 2021-03-11 ENCOUNTER — Other Ambulatory Visit: Payer: Self-pay

## 2021-03-11 ENCOUNTER — Encounter: Payer: Self-pay | Admitting: Family Medicine

## 2021-03-11 ENCOUNTER — Ambulatory Visit (INDEPENDENT_AMBULATORY_CARE_PROVIDER_SITE_OTHER): Payer: No Typology Code available for payment source | Admitting: Family Medicine

## 2021-03-11 VITALS — BP 116/70 | HR 60 | Temp 98.1°F | Resp 16 | Ht 62.0 in | Wt 140.0 lb

## 2021-03-11 DIAGNOSIS — I7300 Raynaud's syndrome without gangrene: Secondary | ICD-10-CM

## 2021-03-11 DIAGNOSIS — L308 Other specified dermatitis: Secondary | ICD-10-CM

## 2021-03-11 DIAGNOSIS — Z8673 Personal history of transient ischemic attack (TIA), and cerebral infarction without residual deficits: Secondary | ICD-10-CM

## 2021-03-11 DIAGNOSIS — E1121 Type 2 diabetes mellitus with diabetic nephropathy: Secondary | ICD-10-CM | POA: Diagnosis not present

## 2021-03-11 DIAGNOSIS — F5104 Psychophysiologic insomnia: Secondary | ICD-10-CM

## 2021-03-11 DIAGNOSIS — G629 Polyneuropathy, unspecified: Secondary | ICD-10-CM

## 2021-03-11 DIAGNOSIS — K219 Gastro-esophageal reflux disease without esophagitis: Secondary | ICD-10-CM | POA: Diagnosis not present

## 2021-03-11 DIAGNOSIS — Z1159 Encounter for screening for other viral diseases: Secondary | ICD-10-CM

## 2021-03-11 DIAGNOSIS — L80 Vitiligo: Secondary | ICD-10-CM

## 2021-03-11 DIAGNOSIS — D75839 Thrombocytosis, unspecified: Secondary | ICD-10-CM | POA: Diagnosis not present

## 2021-03-11 DIAGNOSIS — F330 Major depressive disorder, recurrent, mild: Secondary | ICD-10-CM

## 2021-03-11 DIAGNOSIS — E559 Vitamin D deficiency, unspecified: Secondary | ICD-10-CM

## 2021-03-11 DIAGNOSIS — L304 Erythema intertrigo: Secondary | ICD-10-CM

## 2021-03-11 DIAGNOSIS — E1140 Type 2 diabetes mellitus with diabetic neuropathy, unspecified: Secondary | ICD-10-CM

## 2021-03-11 DIAGNOSIS — G43009 Migraine without aura, not intractable, without status migrainosus: Secondary | ICD-10-CM

## 2021-03-11 DIAGNOSIS — E538 Deficiency of other specified B group vitamins: Secondary | ICD-10-CM

## 2021-03-11 DIAGNOSIS — I10 Essential (primary) hypertension: Secondary | ICD-10-CM

## 2021-03-11 DIAGNOSIS — Z9884 Bariatric surgery status: Secondary | ICD-10-CM

## 2021-03-11 DIAGNOSIS — E7800 Pure hypercholesterolemia, unspecified: Secondary | ICD-10-CM

## 2021-03-11 LAB — POCT GLYCOSYLATED HEMOGLOBIN (HGB A1C): Hemoglobin A1C: 5.1 % (ref 4.0–5.6)

## 2021-03-11 MED ORDER — NYSTATIN 100000 UNIT/GM EX POWD
Freq: Four times a day (QID) | CUTANEOUS | 2 refills | Status: DC
Start: 2021-03-11 — End: 2021-07-29
  Filled 2021-03-11: qty 60, 30d supply, fill #0

## 2021-03-12 LAB — CBC WITH DIFFERENTIAL/PLATELET
Absolute Monocytes: 392 cells/uL (ref 200–950)
Basophils Absolute: 70 cells/uL (ref 0–200)
Basophils Relative: 1 %
Eosinophils Absolute: 224 cells/uL (ref 15–500)
Eosinophils Relative: 3.2 %
HCT: 39.7 % (ref 35.0–45.0)
Hemoglobin: 11.8 g/dL (ref 11.7–15.5)
Lymphs Abs: 2604 cells/uL (ref 850–3900)
MCH: 25.1 pg — ABNORMAL LOW (ref 27.0–33.0)
MCHC: 29.7 g/dL — ABNORMAL LOW (ref 32.0–36.0)
MCV: 84.5 fL (ref 80.0–100.0)
MPV: 10.2 fL (ref 7.5–12.5)
Monocytes Relative: 5.6 %
Neutro Abs: 3710 cells/uL (ref 1500–7800)
Neutrophils Relative %: 53 %
Platelets: 349 10*3/uL (ref 140–400)
RBC: 4.7 10*6/uL (ref 3.80–5.10)
RDW: 12.5 % (ref 11.0–15.0)
Total Lymphocyte: 37.2 %
WBC: 7 10*3/uL (ref 3.8–10.8)

## 2021-03-12 LAB — COMPLETE METABOLIC PANEL WITH GFR
AG Ratio: 1.9 (calc) (ref 1.0–2.5)
ALT: 15 U/L (ref 6–29)
AST: 17 U/L (ref 10–35)
Albumin: 4.3 g/dL (ref 3.6–5.1)
Alkaline phosphatase (APISO): 76 U/L (ref 37–153)
BUN: 18 mg/dL (ref 7–25)
CO2: 31 mmol/L (ref 20–32)
Calcium: 9.3 mg/dL (ref 8.6–10.4)
Chloride: 102 mmol/L (ref 98–110)
Creat: 0.81 mg/dL (ref 0.50–1.03)
Globulin: 2.3 g/dL (calc) (ref 1.9–3.7)
Glucose, Bld: 79 mg/dL (ref 65–99)
Potassium: 4.3 mmol/L (ref 3.5–5.3)
Sodium: 138 mmol/L (ref 135–146)
Total Bilirubin: 0.3 mg/dL (ref 0.2–1.2)
Total Protein: 6.6 g/dL (ref 6.1–8.1)
eGFR: 87 mL/min/{1.73_m2} (ref >=60)

## 2021-03-12 LAB — LIPID PANEL
Cholesterol: 128 mg/dL (ref <200)
HDL: 57 mg/dL (ref >=50)
LDL Cholesterol (Calc): 58 mg/dL (calc)
Non-HDL Cholesterol (Calc): 71 mg/dL (calc) (ref <130)
Total CHOL/HDL Ratio: 2.2 (calc) (ref <5.0)
Triglycerides: 44 mg/dL (ref <150)

## 2021-03-12 LAB — MICROALBUMIN / CREATININE URINE RATIO
Creatinine, Urine: 118 mg/dL (ref 20–275)
Microalb, Ur: <0.2 mg/dL

## 2021-03-12 LAB — VITAMIN D 25 HYDROXY (VIT D DEFICIENCY, FRACTURES): Vit D, 25-Hydroxy: 24 ng/mL — ABNORMAL LOW (ref 30–100)

## 2021-03-12 LAB — HEPATITIS C ANTIBODY
Hepatitis C Ab: NONREACTIVE
SIGNAL TO CUT-OFF: 0 (ref <1.00)

## 2021-03-12 LAB — VITAMIN B12: Vitamin B-12: 551 pg/mL (ref 200–1100)

## 2021-04-09 ENCOUNTER — Other Ambulatory Visit: Payer: Self-pay

## 2021-04-11 ENCOUNTER — Ambulatory Visit: Payer: No Typology Code available for payment source | Attending: Internal Medicine

## 2021-04-11 ENCOUNTER — Other Ambulatory Visit: Payer: Self-pay

## 2021-04-11 DIAGNOSIS — Z23 Encounter for immunization: Secondary | ICD-10-CM

## 2021-04-11 MED ORDER — PFIZER-BIONT COVID-19 VAC-TRIS 30 MCG/0.3ML IM SUSP
INTRAMUSCULAR | 0 refills | Status: DC
  Filled 2021-04-11: qty 0.3, 1d supply, fill #0

## 2021-04-11 NOTE — Progress Notes (Signed)
   Covid-19 Vaccination Clinic  Name:  Shannon Soto    MRN: [id number] DOB: 01-10-1971  04/11/2021  [name] was observed post Covid-19 immunization for 15 minutes without incident. She was provided with Vaccine Information Sheet and instruction to access the V-Safe system.   [name] was instructed to call 911 with any severe reactions post vaccine: Difficulty breathing  Swelling of face and throat  A fast heartbeat  A bad rash all over body  Dizziness and weakness   Immunizations Administered     Name Date Dose VIS Date Route   PFIZER Comrnaty(Gray TOP) Covid-19 Vaccine 04/11/2021  9:11 AM 0.3 mL 08/01/2020 Intramuscular   Manufacturer: [place]   Lot: [id number]   NDC: [place], PharmD, MBA Clinical Acute Care Pharmacist

## 2021-04-22 ENCOUNTER — Other Ambulatory Visit: Payer: Self-pay

## 2021-04-22 ENCOUNTER — Ambulatory Visit (INDEPENDENT_AMBULATORY_CARE_PROVIDER_SITE_OTHER): Payer: No Typology Code available for payment source | Admitting: Dermatology

## 2021-04-22 DIAGNOSIS — L810 Postinflammatory hyperpigmentation: Secondary | ICD-10-CM

## 2021-04-22 DIAGNOSIS — L80 Vitiligo: Secondary | ICD-10-CM | POA: Diagnosis not present

## 2021-04-22 DIAGNOSIS — L309 Dermatitis, unspecified: Secondary | ICD-10-CM | POA: Diagnosis not present

## 2021-04-22 DIAGNOSIS — L304 Erythema intertrigo: Secondary | ICD-10-CM

## 2021-04-22 NOTE — Progress Notes (Signed)
Follow-Up Visit   Subjective  Shannon Soto is a 50 y.o. female who presents for the following: Eczema (Arms, legs, controlled with Opzelura Cream. She occasionally uses TMC 0.1% Cream Patient has seasonal allergies.), Vitiligo (Face, arms, hands, R breast, improving with Opzelura Cream. Halobetasol was supposed to be started at last visit, but pt never received from pharmacy. ), and Intertrigo (Bilateral inguinal, controlled with Nystatin powder and ketoconazole cream.).   The following portions of the chart were reviewed this encounter and updated as appropriate:       Review of Systems:  No other skin or systemic complaints except as noted in HPI or Assessment and Plan.  Objective  Well appearing patient in no apparent distress; mood and affect are within normal limits.  A focused examination was performed including face, arms, hands. Relevant physical exam findings are noted in the Assessment and Plan.  face, arms, hands, R breast Depigmented patches face, arms, elbows, right breast, hands. Some areas with focal repigmentation when compared to photos, especially on face.  arms, legs Hyperpigmented scaly patches primarily on R arm today.  bilateral inguinal creases Area not examined today, clear per patient.   Assessment & Plan  Vitiligo face, arms, hands, R breast  Some improvement noted on face, not at goal  Reviewed chronic nature, no cure and can be difficult to treat.  Vitiligo is an autoimmune condition which causes loss of skin pigment and is commonly seen on the face and may also involve areas of trauma like hands, elbows, knees, and ankles.  Treatments include topical steroids and other topical anti-inflammatory ointments/creams.  Sometimes narrow band UV light therapy or Xtrac laser is helpful, both of which require twice weekly treatments for at least 3-6 months.  Antioxidant vitamins, such as Vitamins C&E, and alpha lipoic acid may be added to enhance  treatment.   Discussed Xtrac laser, may start pending insurance approval.  Zanderm vitiligo concealer discussed and info given. Patient may purchase online. Pt matches with neutral olive  Continue Opzelura cream qd/bid to Aas Hold off on Halobetasol cream for now, pt nervous about skin thinning  Eczema, unspecified type arms, legs  With PIH- improving from recent flare  Atopic dermatitis (eczema) is a chronic, relapsing, pruritic condition that can significantly affect quality of life. It is often associated with allergic rhinitis and/or asthma and can require treatment with topical medications, phototherapy, or in severe cases a biologic medication called Dupixent in children and adults.   Discussed Dupixent injections, patient may consider in the future.  Continue Opzelura Cream BID to AAs until improved.  Continue TMC 0.1% Cream QD/BID prn flares. Avoid face, groin, axilla.  Continue fluocinolone oil QD after shower.  Topical steroids (such as triamcinolone, fluocinolone, fluocinonide, mometasone, clobetasol, halobetasol, betamethasone, hydrocortisone) can cause thinning and lightening of the skin if they are used for too long in the same area. Your physician has selected the right strength medicine for your problem and area affected on the body. Please use your medication only as directed by your physician to prevent side effects.  Recommend mild soap and moisturizing cream 1-2 times daily.  Gentle skin care handout provided.      Related Medications Fluocinolone Acetonide Body 0.01 % OIL Apply 1 application topically to damp skin after shower daily  Erythema intertrigo bilateral inguinal creases  Controlled  Intertrigo is a chronic recurrent rash that occurs in skin fold areas that may be associated with friction; heat; moisture; yeast; fungus; and bacteria.  It  is exacerbated by increased movement / activity; sweating; and higher atmospheric temperature.  Cont Nystatin  powder qd as preventative Cont Ketoconazole 2% cr qd/bid prn flares    Related Medications ketoconazole (NIZORAL) 2 % cream APPLY TO THE AFFECTED AREA(S) TWO TIMES DAILY UNTIL CLEAR  Return in about 3 months (around 07/23/2021) for vitiligo, eczema, intertrigo.  IJamesetta Orleans, CMA, am acting as scribe for Brendolyn Patty, MD .  Documentation: I have reviewed the above documentation for accuracy and completeness, and I agree with the above.  Brendolyn Patty MD

## 2021-04-22 NOTE — Patient Instructions (Signed)

## 2021-05-05 ENCOUNTER — Telehealth: Payer: Self-pay

## 2021-05-05 NOTE — Telephone Encounter (Signed)
Xtrac benefits are in for patient. Left message for patient to return my call.

## 2021-05-06 ENCOUNTER — Other Ambulatory Visit: Payer: Self-pay

## 2021-05-13 ENCOUNTER — Other Ambulatory Visit: Payer: Self-pay

## 2021-05-13 MED ORDER — FLUARIX QUADRIVALENT 0.5 ML IM SUSY
PREFILLED_SYRINGE | INTRAMUSCULAR | 0 refills | Status: DC
  Filled 2021-05-13: qty 0.5, 1d supply, fill #0

## 2021-06-05 ENCOUNTER — Other Ambulatory Visit: Payer: Self-pay

## 2021-06-05 ENCOUNTER — Other Ambulatory Visit: Payer: Self-pay | Admitting: Family Medicine

## 2021-06-05 LAB — HM DIABETES EYE EXAM

## 2021-06-05 MED ORDER — OZEMPIC (0.25 OR 0.5 MG/DOSE) 2 MG/1.5ML ~~LOC~~ SOPN
0.5000 mg | PEN_INJECTOR | SUBCUTANEOUS | 0 refills | Status: DC
  Filled 2021-06-05: qty 4.5, 84d supply, fill #0

## 2021-06-09 ENCOUNTER — Telehealth: Payer: No Typology Code available for payment source | Admitting: Family

## 2021-06-09 DIAGNOSIS — U071 COVID-19: Secondary | ICD-10-CM

## 2021-06-09 MED ORDER — FLUTICASONE PROPIONATE 50 MCG/ACT NA SUSP: 2.0000 | Freq: Every day | NASAL | 6 refills | Status: DC

## 2021-06-09 MED ORDER — BENZONATATE 100 MG PO CAPS: 100.0000 mg | ORAL_CAPSULE | Freq: Three times a day (TID) | ORAL | 0 refills | Status: DC | PRN

## 2021-06-09 MED ORDER — MOLNUPIRAVIR EUA 200MG CAPSULE: 4.0000 | ORAL_CAPSULE | Freq: Two times a day (BID) | ORAL | 0 refills | Status: AC

## 2021-06-09 NOTE — Patient Instructions (Signed)
10 Things You Can Do to Manage Your COVID-19 Symptoms at Home If you have possible or confirmed COVID-19 Stay home except to get medical care. Monitor your symptoms carefully. If your symptoms get worse, call your healthcare provider immediately. Get rest and stay hydrated. If you have a medical appointment, call the healthcare provider ahead of time and tell them that you have or may have COVID-19. For medical emergencies, call 911 and notify the dispatch personnel that you have or may have COVID-19. Cover your cough and sneezes with a tissue or use the inside of your elbow. Wash your hands often with soap and water for at least 20 seconds or clean your hands with an alcohol-based hand sanitizer that contains at least 60% alcohol. As much as possible, stay in a specific room and away from other people in your home. Also, you should use a separate bathroom, if available. If you need to be around other people in or outside of the home, wear a mask. Avoid sharing personal items with other people in your household, like dishes, towels, and bedding. Clean all surfaces that are touched often, like counters, tabletops, and doorknobs. Use household cleaning sprays or wipes according to the label instructions. cdc.gov/coronavirus 03/08/2020 This information is not intended to replace advice given to you by your health care provider. Make sure you discuss any questions you have with your health care provider. Document Revised: 12/26/2020 Document Reviewed: 12/26/2020 Elsevier Patient Education  2022 [employer]  

## 2021-06-09 NOTE — Progress Notes (Signed)
Virtual Visit Consent   Shannon Soto, you are scheduled for a virtual visit with a [facility] provider today.     Just as with appointments in the office, your consent must be obtained to participate.  Your consent will be active for this visit and any virtual visit you may have with one of our providers in the next 365 days.     If you have a MyChart account, a copy of this consent can be sent to you electronically.  All virtual visits are billed to your insurance company just like a traditional visit in the office.    As this is a virtual visit, video technology does not allow for your provider to perform a traditional examination.  This may limit your provider's ability to fully assess your condition.  If your provider identifies any concerns that need to be evaluated in person or the need to arrange testing (such as labs, EKG, etc.), we will make arrangements to do so.     Although advances in technology are sophisticated, we cannot ensure that it will always work on either your end or our end.  If the connection with a video visit is poor, the visit may have to be switched to a telephone visit.  With either a video or telephone visit, we are not always able to ensure that we have a secure connection.     I need to obtain your verbal consent now.   Are you willing to proceed with your visit today?    Shannon Soto has provided verbal consent on 06/09/2021 for a virtual visit (video or telephone).   Evelina Dun, FNP   Date: 06/09/2021 6:28 PM   Virtual Visit via Video Note   I, Evelina Dun, connected with  Shannon Soto  ([id number], [birth year]) on 06/09/21 at  6:30 PM EDT by a video-enabled telemedicine application and verified that I am speaking with the correct person using two identifiers.  Location: Patient: Virtual Visit Location Patient: Home Provider: Virtual Visit Location Provider: Home   I discussed the limitations of evaluation and management by  telemedicine and the availability of in person appointments. The patient expressed understanding and agreed to proceed.    History of Present Illness: Shannon Soto is a 50 y.o. who identifies as a female who was assigned female at birth, and is being seen today for COVID. She started having symptoms two days ago and tested positive yesterday.    HPI: Cough This is a new problem. The current episode started in the past 7 days. The problem has been gradually worsening. The problem occurs every few minutes. The cough is Non-productive. Associated symptoms include chills, ear congestion, a fever, headaches, myalgias, nasal congestion, postnasal drip, a sore throat and wheezing. Pertinent negatives include no ear pain or shortness of breath. She has tried rest and OTC cough suppressant for the symptoms. The treatment provided mild relief.   Problems:  Patient Active Problem List   Diagnosis Date Noted   Headache disorder 09/10/2020   TIA (transient ischemic attack) 05/06/2020   Numbness and tingling in left hand 04/16/2020   Encounter for screening colonoscopy    Polyp of sigmoid colon    Controlled type 2 diabetes mellitus with neuropathy ([place]) 05/12/2016   Raynaud's phenomenon 05/12/2016   Intertrigo 09/06/2015   Migraine headache without aura 02/28/2015   History of obesity 02/28/2015   Hyperlipidemia 02/28/2015   GERD (gastroesophageal reflux disease) 02/28/2015   Vitamin D deficiency 02/28/2015  Chronic constipation 02/28/2015   Chronic insomnia 02/28/2015   Peripheral neuropathy 02/28/2015   Vitiligo 02/28/2015   Lower leg mass 02/28/2015   Depression with anxiety 02/28/2015   History of bariatric surgery 11/05/2014    Allergies: No Known Allergies Medications:  Current Outpatient Medications:    benzonatate (TESSALON PERLES) 100 MG capsule, Take 1 capsule (100 mg total) by mouth 3 (three) times daily as needed., Disp: 20 capsule, Rfl: 0   fluticasone (FLONASE) 50 MCG/ACT  nasal spray, Place 2 sprays into both nostrils daily., Disp: 16 g, Rfl: 6   molnupiravir EUA (LAGEVRIO) 200 mg CAPS capsule, Take 4 capsules (800 mg total) by mouth 2 (two) times daily for 5 days., Disp: 40 capsule, Rfl: 0   amLODipine (NORVASC) 10 MG tablet, TAKE 1 TABLET BY MOUTH DAILY., Disp: 90 tablet, Rfl: 1   aspirin EC 81 MG tablet, , Disp: , Rfl:    Calcium Carb-Cholecalciferol (CALCIUM-VITAMIN D3) 500-400 MG-UNIT TABS, Take 1 tablet by mouth 3 (three) times daily., Disp: , Rfl:    COVID-19 mRNA Vac-TriS, Pfizer, (PFIZER-BIONT COVID-19 VAC-TRIS) SUSP injection, Inject into the muscle., Disp: 0.3 mL, Rfl: 0   cyanocobalamin 500 MCG tablet, Take 1 mcg by mouth every morning., Disp: , Rfl:    ferrous gluconate ([employer] IRON) 240 (27 FE) MG tablet, Take 1 tablet (240 mg total) by mouth 3 (three) times daily with meals., Disp: 30 tablet, Rfl: 0   Fluocinolone Acetonide Body 0.01 % OIL, Apply 1 application topically to damp skin after shower daily, Disp: 118.28 mL, Rfl: 3   FLUoxetine (PROZAC) 10 MG capsule, TAKE 1 CAPSULE BY MOUTH DAILY., Disp: 90 capsule, Rfl: 1   gabapentin (NEURONTIN) 600 MG tablet, TAKE 1 & 1/2 TABLETS (900MG ) BY MOUTH AT BEDTIME, Disp: 135 tablet, Rfl: 1   hydrOXYzine (ATARAX/VISTARIL) 10 MG tablet, TAKE 1 TABLET BY MOUTH EVERY 8 HOURS AS NEEDED, Disp: 90 tablet, Rfl: 0   influenza vac split quadrivalent PF (FLUARIX QUADRIVALENT) 0.5 ML injection, Inject into the muscle., Disp: 0.5 mL, Rfl: 0   ketoconazole (NIZORAL) 2 % cream, APPLY TO THE AFFECTED AREA(S) TWO TIMES DAILY UNTIL CLEAR, Disp: 60 g, Rfl: 3   losartan (COZAAR) 100 MG tablet, TAKE 1 TABLET (100 MG TOTAL) BY MOUTH DAILY., Disp: 90 tablet, Rfl: 1   Multiple Vitamins-Minerals (MULTIVITAMIN ADULTS PO), Take 1 tablet by mouth daily., Disp: , Rfl:    nitroGLYCERIN (NITROGLYN) 2 % ointment, Apply 0.5 inches topically every 6 (six) hours., Disp: 30 g, Rfl: 0   nystatin powder, Apply topically 4 (four) times daily., Disp: 60  g, Rfl: 2   omeprazole (PRILOSEC) 20 MG capsule, TAKE 1 CAPSULE BY MOUTH TWICE DAILY, Disp: 180 capsule, Rfl: 1   ondansetron (ZOFRAN-ODT) 4 MG disintegrating tablet, TAKE 1 TABLET BY MOUTH EVERY 8 HOURS AS NEEDED FOR NAUSEA OR VOMITING, Disp: 20 tablet, Rfl: 0   Rimegepant Sulfate (NURTEC) 75 MG TBDP, Take 1 tablet by mouth daily as needed., Disp: 8 tablet, Rfl: 1   rosuvastatin (CRESTOR) 10 MG tablet, TAKE 1 TABLET BY MOUTH DAILY IN PLACE OF PRAVASTATIN, Disp: 90 tablet, Rfl: 1   Ruxolitinib Phosphate (OPZELURA) 1.5 % CREA, Apply 1 each topically at bedtime., Disp: , Rfl:    Semaglutide,0.25 or 0.5MG /DOS, (OZEMPIC, 0.25 OR 0.5 MG/DOSE,) 2 MG/1.5ML SOPN, INJECT 0.5 MG INTO THE SKIN ONCE A WEEK., Disp: 4.5 mL, Rfl: 0   triamcinolone cream (KENALOG) 0.1 %, Apply topically 2 (two) times daily., Disp: 453.9 g, Rfl: 0  zolpidem (AMBIEN) 10 MG tablet, TAKE 1 TABLET BY MOUTH AT BEDTIME., Disp: 90 tablet, Rfl: 0  Observations/Objective: Patient is well-developed, well-nourished in no acute distress.  Resting comfortably in bed  Head is normocephalic, atraumatic.  No labored breathing.  Speech is clear and coherent with logical content.  Patient is alert and oriented at baseline.  Nasal congestion noted  Intermittent dry cough  Assessment and Plan: 1. COVID-19 COVID positive, rest, force fluids, tylenol as needed, Quarantine for at least 5 days and you are fever free, then must wear a mask out in public from day 4-13, report any worsening symptoms such as increased shortness of breath, swelling, or continued high fevers. Possible adverse effects discussed with antivirals.    Follow Up Instructions: I discussed the assessment and treatment plan with the patient. The patient was provided an opportunity to ask questions and all were answered. The patient agreed with the plan and demonstrated an understanding of the instructions.  A copy of instructions were sent to the patient via MyChart unless  otherwise noted below.     The patient was advised to call back or seek an in-person evaluation if the symptoms worsen or if the condition fails to improve as anticipated.  Time:  I spent 13 minutes with the patient via telehealth technology discussing the above problems/concerns.    Evelina Dun, FNP

## 2021-06-25 ENCOUNTER — Other Ambulatory Visit: Payer: Self-pay | Admitting: Family Medicine

## 2021-06-25 DIAGNOSIS — Z1231 Encounter for screening mammogram for malignant neoplasm of breast: Secondary | ICD-10-CM

## 2021-06-29 ENCOUNTER — Encounter: Payer: Self-pay | Admitting: Family Medicine

## 2021-06-30 ENCOUNTER — Other Ambulatory Visit: Payer: Self-pay

## 2021-06-30 DIAGNOSIS — E1140 Type 2 diabetes mellitus with diabetic neuropathy, unspecified: Secondary | ICD-10-CM

## 2021-06-30 DIAGNOSIS — E1121 Type 2 diabetes mellitus with diabetic nephropathy: Secondary | ICD-10-CM

## 2021-06-30 DIAGNOSIS — K219 Gastro-esophageal reflux disease without esophagitis: Secondary | ICD-10-CM

## 2021-06-30 DIAGNOSIS — E7800 Pure hypercholesterolemia, unspecified: Secondary | ICD-10-CM

## 2021-06-30 DIAGNOSIS — F5104 Psychophysiologic insomnia: Secondary | ICD-10-CM

## 2021-06-30 DIAGNOSIS — I7300 Raynaud's syndrome without gangrene: Secondary | ICD-10-CM

## 2021-06-30 DIAGNOSIS — G629 Polyneuropathy, unspecified: Secondary | ICD-10-CM

## 2021-06-30 DIAGNOSIS — Z8673 Personal history of transient ischemic attack (TIA), and cerebral infarction without residual deficits: Secondary | ICD-10-CM

## 2021-06-30 DIAGNOSIS — I10 Essential (primary) hypertension: Secondary | ICD-10-CM

## 2021-06-30 DIAGNOSIS — F330 Major depressive disorder, recurrent, mild: Secondary | ICD-10-CM

## 2021-06-30 MED ORDER — ROSUVASTATIN CALCIUM 10 MG PO TABS
ORAL_TABLET | ORAL | 1 refills | Status: DC
  Filled 2021-06-30: qty 90, 90d supply, fill #0
  Filled 2021-10-01: qty 90, 90d supply, fill #1

## 2021-06-30 MED ORDER — LOSARTAN POTASSIUM 100 MG PO TABS
ORAL_TABLET | Freq: Every day | ORAL | 1 refills | Status: DC
  Filled 2021-06-30: qty 90, 90d supply, fill #0
  Filled 2021-10-01: qty 90, 90d supply, fill #1

## 2021-06-30 MED ORDER — HYDROXYZINE HCL 10 MG PO TABS
ORAL_TABLET | Freq: Three times a day (TID) | ORAL | 0 refills | Status: DC | PRN
  Filled 2021-06-30: qty 90, 30d supply, fill #0

## 2021-06-30 MED ORDER — GABAPENTIN 600 MG PO TABS
ORAL_TABLET | ORAL | 1 refills | Status: DC
  Filled 2021-06-30: qty 135, 90d supply, fill #0
  Filled 2021-10-01: qty 135, 90d supply, fill #1

## 2021-06-30 MED ORDER — ZOLPIDEM TARTRATE 10 MG PO TABS
ORAL_TABLET | Freq: Every day | ORAL | 0 refills | Status: DC
  Filled 2021-06-30: qty 90, 90d supply, fill #0

## 2021-06-30 MED ORDER — OMEPRAZOLE 20 MG PO CPDR
DELAYED_RELEASE_CAPSULE | Freq: Two times a day (BID) | ORAL | 1 refills | Status: DC
  Filled 2021-06-30: qty 180, 90d supply, fill #0
  Filled 2021-10-01: qty 180, 90d supply, fill #1

## 2021-06-30 MED ORDER — AMLODIPINE BESYLATE 10 MG PO TABS
ORAL_TABLET | Freq: Every day | ORAL | 1 refills | Status: DC
  Filled 2021-06-30: qty 90, 90d supply, fill #0
  Filled 2021-10-01: qty 90, 90d supply, fill #1

## 2021-06-30 MED ORDER — FLUOXETINE HCL 10 MG PO CAPS
ORAL_CAPSULE | Freq: Every day | ORAL | 1 refills | Status: DC
  Filled 2021-06-30: qty 90, 90d supply, fill #0
  Filled 2021-10-01: qty 90, 90d supply, fill #1

## 2021-07-07 NOTE — Progress Notes (Signed)
Name: Shannon Soto   MRN: [id number]    DOB: [birth year]   Date:07/08/2021       Progress Note  Subjective  Chief Complaint  Follow Up  HPI  Bariatric Surgery: she had sleeve surgery done by Dr. Hassell Done on March 14th, 2016, she has achieved her goal weight of 145 lbs, today weight is up to 150 lbs, she states she is very stressed, skipping meals and not as active since COVI-19  Weight prior to surgery was 242 lbs She is doing well, hgbA1C started to go up, and we started her on Ozempic 02/2017 weight was in the 140 range until Fall 2020 when weight went up to 150 lbs on her last visit, since Feb she lost down to 137 lbs by increasing her walks, weight was stable however when admitted to [facility] for possible TIA in August she lost her appetite and weight dropped to 131 lbs. She has been gaining weight again gradually , more since March when we went down on the dose of Ozempic from 1 mg to 0.5 mg we will try going up to 1 mg again today   Eczema: flaring up again, Dr. Rochele Pages recommend Dupixent but she wants to think about it   Vitiligo: seen by Dr. Nicole Kindred, using a new topical medication but some of the spots are getting darker, but down to Opzelura once a day instead of BID . No side effects of medication    GERD: she has a long history of reflux, symptoms were controlled with Omeprazole 20 mg  BID, discussed long term use of PPI. We have discussed weaning off but she is not ready for it   Chronic Idiopathic constipation: she has a long history of constipation, unable to tolerate Miralax, took Linzess in the past but it caused burning in her stomach and explosive diarrhea multiple times a day. She is currently taking otc laxative on Friday mornings to have one bowel movement a week. She states she has cramping in the middle of the week. We will try low dose of Amitiza    Hyperlipidemia: .Reviewed last labs and LDL was at goal at 58 continue Rosuvastatin    HTN: she is on Norvasc for Raynaud's  and Losartan for microalbuminuria No chest pain or  dizziness. She has intermittent palpitation lasts a few seconds , not associated with nausea or diaphoresis .BP is stable.    Insomnia: she takes Ambien, she has been on medication for many years, she used to take high dose, now she is trying to make it last and at times takes half . She is going to bed around 11 pm. Taking half of hydroxizine around 8 pm and Ambien before bed. She states taking both has helped her gets sleep and fall asleep, she was sleeping around 6 hours per night. Unchanged    DMII with peripheral neuropathy and nephropathy:  off metformin since bariatric surgery in 10/2014 , no polyphagia, but has episodes of polydipsia but no polyuria  but has nocturia times once  every night.. On Gabapentin seems to help with neuropathy, taking one and a half 600 mg tablets at night only,  She is back on Losartan , because last urine micro was  100. A1C has been within normal for a long time , she has been tolerating Ozempic, no side effects , she does not want to stop medication since it helps keep her weight down   We went down on the dose from 1 mg to 0.5 g  back in April and   initially gained 9 lbs and another 7 lbs since last visit, Weight today is 147 lbs, A1C went from 5.1 % to 5.4 % an She really would like going back on Ozempic 1 mg to help her lose her weight. .   Major depression: chronic symptoms since her son was born. She has been taking Fluoxetine, one daily, occasionally takes one extra pill during the day, discussed taking hydroxizine prn for anxiety . No side effects. Denies crying spells, or anhedonia, just worries constantly about her son that has special needs .She denies suicidal thoughts or ideation . Continue current regiment . She has a good support system. Unchanged    Migraines: she has tried multiple medications, but could not tolerate,  episodes about 1-2 times months. Episodes are described as  pain is behind her eyes,  throbbing like, associated with nausea when she has a severe episode and photophobia. It can last up to 4 hours with medication ( Tylenol  ) she is off nsaid's and has been taking Nurtec prn if she takes it right away. She still has medication at home   Raynaud's: symptoms of  fingers on both hands gets white , painful and numb, not sure of the triggers at this time.  Worse this time of the year due to cold weather. Discussed wearing gloves when cold   History of TIA: admitted back in August 2021 with left middle finger pain, intense, seen by neurologist, questionable TIA versus severe Raynaud's , symptoms lasted days, but is pain finally resolved. She has seen Neurologist, Rheumatologist and Cardiologist, however lost to follow up with cardiologist, Dr. Saralyn Pilar , she would like to see someone else  .    Patient Active Problem List   Diagnosis Date Noted   Headache disorder 09/10/2020   TIA (transient ischemic attack) 05/06/2020   Numbness and tingling in left hand 04/16/2020   Encounter for screening colonoscopy    Polyp of sigmoid colon    Controlled type 2 diabetes mellitus with neuropathy ([place]) 05/12/2016   Raynaud's phenomenon 05/12/2016   Intertrigo 09/06/2015   Migraine headache without aura 02/28/2015   History of obesity 02/28/2015   Hyperlipidemia 02/28/2015   GERD (gastroesophageal reflux disease) 02/28/2015   Vitamin D deficiency 02/28/2015   Chronic constipation 02/28/2015   Chronic insomnia 02/28/2015   Peripheral neuropathy 02/28/2015   Vitiligo 02/28/2015   Lower leg mass 02/28/2015   Depression with anxiety 02/28/2015   History of bariatric surgery 11/05/2014    Past Surgical History:  Procedure Laterality Date   ABDOMINAL HYSTERECTOMY     APPENDECTOMY     BREAST SURGERY     BREATH TEK H PYLORI N/A 08/27/2014   Procedure: BREATH TEK H PYLORI;  Surgeon: Pedro Earls, MD;  Location: [facility];  Service: General;  Laterality: N/A;   CATARACT EXTRACTION  W/PHACO Left 09/20/2019   Procedure: CATARACT EXTRACTION PHACO AND INTRAOCULAR LENS PLACEMENT (IOC) LEFT 0.74 00:12.3 6.0%;  Surgeon: Leandrew Koyanagi, MD;  Location: [facility];  Service: Ophthalmology;  Laterality: Left;   CATARACT EXTRACTION W/PHACO Right 10/11/2019   Procedure: CATARACT EXTRACTION PHACO AND INTRAOCULAR LENS PLACEMENT (IOC)  RIGHT 1.55 00:24.1 6.4%;  Surgeon: Leandrew Koyanagi, MD;  Location: [facility];  Service: Ophthalmology;  Laterality: Right;  diabetic   COLONOSCOPY WITH PROPOFOL N/A 02/17/2019   Procedure: COLONOSCOPY WITH PROPOFOL;  Surgeon: Lucilla Lame, MD;  Location: [place];  Service: Endoscopy;  Laterality: N/A;   HIATAL HERNIA  REPAIR  11/05/2014   Procedure: LAPAROSCOPIC REPAIR OF HIATAL HERNIA;  Surgeon: Johnathan Hausen, MD;  Location: [facility];  Service: General;;   LAPAROSCOPIC GASTRIC SLEEVE RESECTION N/A 11/05/2014   Procedure: LAPAROSCOPIC GASTRIC SLEEVE RESECTION;  Surgeon: Johnathan Hausen, MD;  Location: [facility];  Service: General;  Laterality: N/A;   POLYPECTOMY  02/17/2019   Procedure: POLYPECTOMY;  Surgeon: Lucilla Lame, MD;  Location: [facility];  Service: Endoscopy;;   REDUCTION MAMMAPLASTY Bilateral 1997   UPPER GI ENDOSCOPY  11/05/2014   Procedure: UPPER GI ENDOSCOPY;  Surgeon: Johnathan Hausen, MD;  Location: [facility];  Service: General;;    Family History  Problem Relation Age of Onset   Diabetes Paternal Uncle    Stroke Father    Hypertension Other    Breast cancer Maternal Grandmother 72    Social History   Tobacco Use   Smoking status: Never   Smokeless tobacco: Never  Substance Use Topics   Alcohol use: No    Alcohol/week: 0.0 standard drinks    Comment: occasional      Current Outpatient Medications:    amLODipine (NORVASC) 10 MG tablet, TAKE 1 TABLET BY MOUTH DAILY., Disp: 90 tablet, Rfl: 1   aspirin EC 81 MG tablet, , Disp: , Rfl:    Calcium Carb-Cholecalciferol (CALCIUM-VITAMIN D3) 500-400  MG-UNIT TABS, Take 1 tablet by mouth 3 (three) times daily., Disp: , Rfl:    cyanocobalamin 500 MCG tablet, Take 1 mcg by mouth every morning., Disp: , Rfl:    ferrous gluconate ([employer] IRON) 240 (27 FE) MG tablet, Take 1 tablet (240 mg total) by mouth 3 (three) times daily with meals., Disp: 30 tablet, Rfl: 0   Fluocinolone Acetonide Body 0.01 % OIL, Apply 1 application topically to damp skin after shower daily, Disp: 118.28 mL, Rfl: 3   FLUoxetine (PROZAC) 10 MG capsule, TAKE 1 CAPSULE BY MOUTH DAILY., Disp: 90 capsule, Rfl: 1   gabapentin (NEURONTIN) 600 MG tablet, TAKE 1 & 1/2 TABLETS (900MG ) BY MOUTH AT BEDTIME, Disp: 135 tablet, Rfl: 1   hydrOXYzine (ATARAX/VISTARIL) 10 MG tablet, TAKE 1 TABLET BY MOUTH EVERY 8 HOURS AS NEEDED, Disp: 90 tablet, Rfl: 0   influenza vac split quadrivalent PF (FLUARIX QUADRIVALENT) 0.5 ML injection, Inject into the muscle., Disp: 0.5 mL, Rfl: 0   ketoconazole (NIZORAL) 2 % cream, APPLY TO THE AFFECTED AREA(S) TWO TIMES DAILY UNTIL CLEAR, Disp: 60 g, Rfl: 3   losartan (COZAAR) 100 MG tablet, TAKE 1 TABLET (100 MG TOTAL) BY MOUTH DAILY., Disp: 90 tablet, Rfl: 1   lubiprostone (AMITIZA) 8 MCG capsule, Take 1 capsule (8 mcg total) by mouth 2 (two) times daily with a meal., Disp: 180 capsule, Rfl: 1   Multiple Vitamins-Minerals (MULTIVITAMIN ADULTS PO), Take 1 tablet by mouth daily., Disp: , Rfl:    nitroGLYCERIN (NITROGLYN) 2 % ointment, Apply 0.5 inches topically every 6 (six) hours., Disp: 30 g, Rfl: 0   nystatin powder, Apply topically 4 (four) times daily., Disp: 60 g, Rfl: 2   omeprazole (PRILOSEC) 20 MG capsule, TAKE 1 CAPSULE BY MOUTH TWICE DAILY, Disp: 180 capsule, Rfl: 1   ondansetron (ZOFRAN-ODT) 4 MG disintegrating tablet, TAKE 1 TABLET BY MOUTH EVERY 8 HOURS AS NEEDED FOR NAUSEA OR VOMITING, Disp: 20 tablet, Rfl: 0   Rimegepant Sulfate (NURTEC) 75 MG TBDP, Take 1 tablet by mouth daily as needed., Disp: 8 tablet, Rfl: 1   rosuvastatin (CRESTOR) 10 MG tablet,  TAKE 1 TABLET BY MOUTH DAILY IN PLACE  OF PRAVASTATIN, Disp: 90 tablet, Rfl: 1   Ruxolitinib Phosphate (OPZELURA) 1.5 % CREA, Apply 1 each topically at bedtime., Disp: , Rfl:    Semaglutide, 1 MG/DOSE, 4 MG/3ML SOPN, Inject 1 mg as directed once a week., Disp: 9 mL, Rfl: 0   triamcinolone cream (KENALOG) 0.1 %, Apply topically 2 (two) times daily., Disp: 453.9 g, Rfl: 0   zolpidem (AMBIEN) 10 MG tablet, TAKE 1 TABLET BY MOUTH AT BEDTIME., Disp: 90 tablet, Rfl: 0  No Known Allergies  I personally reviewed active problem list, medication list, allergies, family history, social history, health maintenance with the patient/caregiver today.   ROS  Constitutional: Negative for fever or weight change.  Respiratory: Negative for cough and shortness of breath.   Cardiovascular: Negative for chest pain or palpitations.  Gastrointestinal: Negative for abdominal pain, no bowel changes.  Musculoskeletal: Negative for gait problem or joint swelling.  Skin: Negative for rash.  Neurological: Negative for dizziness or headache.  No other specific complaints in a complete review of systems (except as listed in HPI above).   Objective  Vitals:   07/08/21 1311  BP: 112/68  Pulse: 82  Resp: 16  Temp: 98.5 F (36.9 C)  SpO2: 96%  Weight: 147 lb (66.7 kg)  Height: 5\' 2"  (1.575 m)    Body mass index is 26.89 kg/m.  Physical Exam  Constitutional: Patient appears well-developed and well-nourished. No distress.  HEENT: head atraumatic, normocephalic, pupils equal and reactive to light,neck supple,  Cardiovascular: Normal rate, regular rhythm and normal heart sounds.  No murmur heard. No BLE edema. Pulmonary/Chest: Effort normal and breath sounds normal. No respiratory distress. Abdominal: Soft.  There is no tenderness. Skin: vitiligo also eczematous patch on right arm Psychiatric: Patient has a normal mood and affect. behavior is normal. Judgment and thought content normal.   Recent Results  (from the past 2160 hour(s))  HM DIABETES EYE EXAM     Status: None   Collection Time: 06/05/21 12:00 AM  Result Value Ref Range   HM Diabetic Eye Exam No Retinopathy No Retinopathy  POCT HgB A1C     Status: None   Collection Time: 07/08/21  1:22 PM  Result Value Ref Range   Hemoglobin A1C 5.4 4.0 - 5.6 %   HbA1c POC (<> result, manual entry)     HbA1c, POC (prediabetic range)     HbA1c, POC (controlled diabetic range)      Diabetic Foot Exam: Diabetic Foot Exam - Simple   Simple Foot Form Diabetic Foot exam was performed with the following findings: Yes 07/08/2021  1:56 PM  Visual Inspection No deformities, no ulcerations, no other skin breakdown bilaterally: Yes Sensation Testing Intact to touch and monofilament testing bilaterally: Yes Pulse Check Posterior Tibialis and Dorsalis pulse intact bilaterally: Yes Comments      PHQ2/9: Depression screen [facility] 2/9 07/08/2021 03/11/2021 10/22/2020 06/18/2020 04/25/2020  Decreased Interest 0 1 0 0 2  Down, Depressed, Hopeless 0 0 0 0 2  PHQ - 2 Score 0 1 0 0 4  Altered sleeping 0 0 0 1 2  Tired, decreased energy 0 1 3 1 2   Change in appetite 0 0 0 1 2  Feeling bad or failure about yourself  0 0 0 0 0  Trouble concentrating 0 0 0 0 1  Moving slowly or fidgety/restless 0 0 0 0 1  Suicidal thoughts 0 0 0 0 0  PHQ-9 Score 0 2 3 3 12   Difficult doing work/chores - - -  Somewhat difficult Very difficult  Some recent data might be hidden    phq 9 is negative   Fall Risk: Fall Risk  07/08/2021 03/11/2021 10/22/2020 06/18/2020 04/25/2020  Falls in the past year? 0 0 0 0 0  Number falls in past yr: 0 0 0 0 0  Injury with Fall? 0 0 0 0 0  Risk for fall due to : No Fall Risks - - - -  Follow up Falls prevention discussed Falls prevention discussed - - -      Functional Status Survey: Is the patient deaf or have difficulty hearing?: No Does the patient have difficulty seeing, even when wearing glasses/contacts?: No Does the patient have  difficulty concentrating, remembering, or making decisions?: No Does the patient have difficulty walking or climbing stairs?: No Does the patient have difficulty dressing or bathing?: No Does the patient have difficulty doing errands alone such as visiting a doctor's office or shopping?: No    Assessment & Plan   1. Type 2 diabetes with nephropathy (HCC)  - POCT HgB A1C - HM Diabetes Foot Exam - Semaglutide, 1 MG/DOSE, 4 MG/3ML SOPN; Inject 1 mg as directed once a week.  Dispense: 9 mL; Refill: 0  2. Mild recurrent major depression (HCC)  Stable  3. History of TIA (transient ischemic attack)  - Ambulatory referral to Cardiology, she would like to see someone else  4. Controlled type 2 diabetes mellitus with neuropathy ([place])   5. Migraine without aura and without status migrainosus, not intractable  Taking medication prn   6. Raynaud's disease without gangrene  Worse now due to cold weather   7. Chronic insomnia   8. Hypertension, benign  - Ambulatory referral to Cardiology  9. B12 deficiency   10. Vitamin D deficiency   11. Pure hypercholesterolemia   12. History of bariatric surgery  Gaining weight again and we will increase dose of Ozempic to 1 mg again   13. Peripheral polyneuropathy   14. Gastroesophageal reflux disease with esophagitis without hemorrhage   15. Other eczema   16. Chronic idiopathic constipation  - lubiprostone (AMITIZA) 8 MCG capsule; Take 1 capsule (8 mcg total) by mouth 2 (two) times daily with a meal.  Dispense: 180 capsule; Refill: 1

## 2021-07-08 ENCOUNTER — Encounter: Payer: Self-pay | Admitting: Family Medicine

## 2021-07-08 ENCOUNTER — Other Ambulatory Visit: Payer: Self-pay

## 2021-07-08 ENCOUNTER — Ambulatory Visit (INDEPENDENT_AMBULATORY_CARE_PROVIDER_SITE_OTHER): Payer: No Typology Code available for payment source | Admitting: Family Medicine

## 2021-07-08 VITALS — BP 112/68 | HR 82 | Temp 98.5°F | Resp 16 | Ht 62.0 in | Wt 147.0 lb

## 2021-07-08 DIAGNOSIS — Z8673 Personal history of transient ischemic attack (TIA), and cerebral infarction without residual deficits: Secondary | ICD-10-CM | POA: Diagnosis not present

## 2021-07-08 DIAGNOSIS — E1140 Type 2 diabetes mellitus with diabetic neuropathy, unspecified: Secondary | ICD-10-CM | POA: Diagnosis not present

## 2021-07-08 DIAGNOSIS — K2100 Gastro-esophageal reflux disease with esophagitis, without bleeding: Secondary | ICD-10-CM

## 2021-07-08 DIAGNOSIS — G43009 Migraine without aura, not intractable, without status migrainosus: Secondary | ICD-10-CM

## 2021-07-08 DIAGNOSIS — K5904 Chronic idiopathic constipation: Secondary | ICD-10-CM

## 2021-07-08 DIAGNOSIS — F5104 Psychophysiologic insomnia: Secondary | ICD-10-CM

## 2021-07-08 DIAGNOSIS — F330 Major depressive disorder, recurrent, mild: Secondary | ICD-10-CM

## 2021-07-08 DIAGNOSIS — L308 Other specified dermatitis: Secondary | ICD-10-CM

## 2021-07-08 DIAGNOSIS — G629 Polyneuropathy, unspecified: Secondary | ICD-10-CM

## 2021-07-08 DIAGNOSIS — E538 Deficiency of other specified B group vitamins: Secondary | ICD-10-CM

## 2021-07-08 DIAGNOSIS — E1121 Type 2 diabetes mellitus with diabetic nephropathy: Secondary | ICD-10-CM | POA: Diagnosis not present

## 2021-07-08 DIAGNOSIS — I7300 Raynaud's syndrome without gangrene: Secondary | ICD-10-CM

## 2021-07-08 DIAGNOSIS — Z9884 Bariatric surgery status: Secondary | ICD-10-CM

## 2021-07-08 DIAGNOSIS — E559 Vitamin D deficiency, unspecified: Secondary | ICD-10-CM

## 2021-07-08 DIAGNOSIS — E7800 Pure hypercholesterolemia, unspecified: Secondary | ICD-10-CM

## 2021-07-08 DIAGNOSIS — I10 Essential (primary) hypertension: Secondary | ICD-10-CM

## 2021-07-08 LAB — POCT GLYCOSYLATED HEMOGLOBIN (HGB A1C): Hemoglobin A1C: 5.4 % (ref 4.0–5.6)

## 2021-07-08 MED ORDER — LUBIPROSTONE 8 MCG PO CAPS
8.0000 ug | ORAL_CAPSULE | Freq: Two times a day (BID) | ORAL | 1 refills | Status: DC
Start: 2021-07-08 — End: 2022-04-13
  Filled 2021-07-08: qty 180, 90d supply, fill #0

## 2021-07-08 MED ORDER — SEMAGLUTIDE (1 MG/DOSE) 4 MG/3ML ~~LOC~~ SOPN
1.0000 mg | PEN_INJECTOR | SUBCUTANEOUS | 0 refills | Status: DC
  Filled 2021-07-08: qty 3, 28d supply, fill #0
  Filled 2021-08-04: qty 3, 28d supply, fill #1
  Filled 2021-09-11: qty 3, 28d supply, fill #2

## 2021-07-09 ENCOUNTER — Other Ambulatory Visit: Payer: Self-pay

## 2021-07-22 ENCOUNTER — Ambulatory Visit: Payer: No Typology Code available for payment source | Admitting: Dermatology

## 2021-07-24 ENCOUNTER — Other Ambulatory Visit: Payer: Self-pay

## 2021-07-24 ENCOUNTER — Ambulatory Visit
Admission: RE | Admit: 2021-07-24 | Discharge: 2021-07-24 | Disposition: A | Discharge status: Home / Self Care | Payer: No Typology Code available for payment source | Source: Ambulatory Visit | Attending: Family Medicine | Admitting: Family Medicine

## 2021-07-24 DIAGNOSIS — Z1231 Encounter for screening mammogram for malignant neoplasm of breast: Secondary | ICD-10-CM | POA: Insufficient documentation

## 2021-07-29 ENCOUNTER — Other Ambulatory Visit: Payer: Self-pay

## 2021-07-29 ENCOUNTER — Encounter: Payer: Self-pay | Admitting: Family Medicine

## 2021-07-29 ENCOUNTER — Other Ambulatory Visit: Payer: Self-pay | Admitting: Family Medicine

## 2021-07-29 DIAGNOSIS — L304 Erythema intertrigo: Secondary | ICD-10-CM

## 2021-07-29 DIAGNOSIS — L308 Other specified dermatitis: Secondary | ICD-10-CM

## 2021-07-29 DIAGNOSIS — G43009 Migraine without aura, not intractable, without status migrainosus: Secondary | ICD-10-CM

## 2021-07-29 MED ORDER — OPZELURA 1.5 % EX CREA
1.0000 | TOPICAL_CREAM | Freq: Every day | CUTANEOUS | 0 refills | Status: DC
Start: 2021-07-29 — End: 2021-12-22

## 2021-07-29 MED ORDER — NYSTATIN 100000 UNIT/GM EX POWD
Freq: Four times a day (QID) | CUTANEOUS | 2 refills | Status: DC
  Filled 2021-07-29: qty 60, 10d supply, fill #0
  Filled 2022-01-08: qty 60, 10d supply, fill #1

## 2021-07-29 MED ORDER — ONDANSETRON 4 MG PO TBDP
ORAL_TABLET | Freq: Three times a day (TID) | ORAL | 0 refills | Status: DC | PRN
  Filled 2021-07-29: qty 20, 7d supply, fill #0

## 2021-07-29 MED ORDER — TRIAMCINOLONE ACETONIDE 0.1 % EX CREA
TOPICAL_CREAM | Freq: Two times a day (BID) | CUTANEOUS | 0 refills | Status: DC
  Filled 2021-07-29: qty 454, 90d supply, fill #0

## 2021-07-29 NOTE — Progress Notes (Signed)
Opzelura RF request approval

## 2021-08-05 ENCOUNTER — Other Ambulatory Visit: Payer: Self-pay

## 2021-09-09 ENCOUNTER — Ambulatory Visit: Payer: No Typology Code available for payment source | Admitting: Dermatology

## 2021-09-12 ENCOUNTER — Other Ambulatory Visit: Payer: Self-pay

## 2021-10-02 ENCOUNTER — Other Ambulatory Visit: Payer: Self-pay

## 2021-10-08 ENCOUNTER — Other Ambulatory Visit: Payer: Self-pay

## 2021-10-08 ENCOUNTER — Ambulatory Visit (INDEPENDENT_AMBULATORY_CARE_PROVIDER_SITE_OTHER): Payer: No Typology Code available for payment source | Admitting: Dermatology

## 2021-10-08 DIAGNOSIS — L80 Vitiligo: Secondary | ICD-10-CM

## 2021-10-08 DIAGNOSIS — L810 Postinflammatory hyperpigmentation: Secondary | ICD-10-CM

## 2021-10-08 DIAGNOSIS — L309 Dermatitis, unspecified: Secondary | ICD-10-CM

## 2021-10-08 NOTE — Patient Instructions (Signed)

## 2021-10-08 NOTE — Progress Notes (Signed)
Follow-Up Visit   Subjective  Shannon Soto is a 51 y.o. female who presents for the following: Follow-up.  Patient here for 6 month follow-up. She has vitiligo on the face, arms, hands, R breast. She is using Opzelura Cream. She also has Eczema of the arms and legs and is using Opzelura Cream, TMC 0.1% Cream, and fluocinolone oil after shower. Patient states she is controlled with meds. She is repigmenting on face.  The following portions of the chart were reviewed this encounter and updated as appropriate:       Review of Systems:  No other skin or systemic complaints except as noted in HPI or Assessment and Plan.  Objective  Well appearing patient in no apparent distress; mood and affect are within normal limits.  A focused examination was performed including face, arms, legs, hands. Relevant physical exam findings are noted in the Assessment and Plan.  face, arms, hands Depigmented patches, some areas with focal repigmentation compared to photos, especially on face.         arms, legs Pink patch with dyspigmentation R chest/breast area    Assessment & Plan  Vitiligo face, arms, hands  Chronic and persistent condition with duration or expected duration over one year. Condition is symptomatic/ bothersome to patient. Improving but not currently at goal.   Reviewed chronic nature, no cure and can be difficult to treat.  Vitiligo is an autoimmune condition which causes loss of skin pigment and is commonly seen on the face and may also involve areas of trauma like hands, elbows, knees, and ankles.  Treatments include topical steroids and other topical anti-inflammatory ointments/creams and topical and oral Jak inhibitors.  Sometimes narrow band UV light therapy or Xtrac laser is helpful, both of which require twice weekly treatments for at least 3-6 months.  Antioxidant vitamins, such as Vitamins A,C,E,D, Folic Acid and B12 may be added to enhance treatment.  Improving  compared to photos, more so on face. New photos taken today.  Continue Opzelura Cream Apply to AAs qd/bid. Pt has refills.   Discussed Xtrac laser. Will run benefits to see if covered.   Eczema, unspecified type arms, legs  With PIH, Chronic condition with duration or expected duration over one year. Currently well-controlled.   Atopic dermatitis (eczema) is a chronic, relapsing, pruritic condition that can significantly affect quality of life. It is often associated with allergic rhinitis and/or asthma and can require treatment with topical medications, phototherapy, or in severe cases biologic injectable medication (Dupixent; Adbry) or Oral JAK inhibitors.  Continue Opzelura Cream Apply to AAs qd/bid prn.  Continue TMC 0.1% Cream to more severe areas qd/bid prn. Avoid face, groin, axilla. Pt has refills.  Continue fluocinolone oil QD after shower.   Topical steroids (such as triamcinolone, fluocinolone, fluocinonide, mometasone, clobetasol, halobetasol, betamethasone, hydrocortisone) can cause thinning and lightening of the skin if they are used for too long in the same area. Your physician has selected the right strength medicine for your problem and area affected on the body. Please use your medication only as directed by your physician to prevent side effects.    Related Medications Fluocinolone Acetonide Body 0.01 % OIL Apply 1 application topically to damp skin after shower daily   Return in about 6 months (around 04/07/2022) for Vitiligo, eczema.  IJamesetta Orleans, CMA, am acting as scribe for Shannon Patty, MD .  Documentation: I have reviewed the above documentation for accuracy and completeness, and I agree with the above.  Shannon Soto  Shannon Kindred MD

## 2021-10-09 ENCOUNTER — Ambulatory Visit (INDEPENDENT_AMBULATORY_CARE_PROVIDER_SITE_OTHER): Payer: No Typology Code available for payment source | Admitting: Cardiology

## 2021-10-09 ENCOUNTER — Encounter: Payer: Self-pay | Admitting: Cardiology

## 2021-10-09 VITALS — BP 110/70 | HR 70 | Ht 62.5 in | Wt 143.0 lb

## 2021-10-09 DIAGNOSIS — I10 Essential (primary) hypertension: Secondary | ICD-10-CM

## 2021-10-09 DIAGNOSIS — E7800 Pure hypercholesterolemia, unspecified: Secondary | ICD-10-CM

## 2021-10-09 DIAGNOSIS — G459 Transient cerebral ischemic attack, unspecified: Secondary | ICD-10-CM | POA: Diagnosis not present

## 2021-10-09 NOTE — Patient Instructions (Signed)

## 2021-10-09 NOTE — Progress Notes (Signed)
Cardiology Office Note:    Date:  10/09/2021   ID:  [name], DOB Jan 29, 1971, MRN [id number]  PCP:  Steele Sizer, MD   [place] Prov[facility] Cardiologist:  Kate Sable, MD     Referring MD: Steele Sizer, MD   Chief Complaint  Patient presents with   New Patient (Initial Visit)    Referred by PCP for TIA and Hypertension. Meds reviewed verbally with patient.    Shannon Soto is a 51 y.o. female who is being seen today for the evaluation of TIA at the request of Steele Sizer, MD.   History of Present Illness:    Shannon Soto is a 51 y.o. female with a hx of hypertension, hyperlipidemia, diabetes, Raynaud's,  TIA who presents due to history of cerebrovascular event.  About 2 years ago in 2021, patient had symptoms of headaches, left hand numbness.  She has symptoms history of Raynaud's, usually symptoms improved.  Her symptoms persisted causing patient to go to the emergency room.  Patient was seen in the ED 04/15/2020 due to symptoms of left hand tingling.  Work-up for CVA including MRI brain was normal.  Echo was also normal.  She was diagnosed with possible TIA versus migraine.  She takes aspirin and Crestor.  Followed up with [facility] cardiology, cardiac monitor was placed x2 weeks which did not show any atrial fibrillation or atrial flutter.  She has occasional skipped heartbeats, denies dizziness, chest pain.  Has a history of proteinuria, started on losartan for this.  Takes amlodipine for both BP control and Raynaud's.  Echocardiogram 03/2020 EF 65 to 70%, Ultrasound carotid bilateral 03/2020 Minor carotid atherosclerosis, no significant ICA stenosis  Past Medical History:  Diagnosis Date   Diabetes mellitus without complication ([place])    improved after gastric sleeve   GERD (gastroesophageal reflux disease)    Hyperlipidemia    Hypertension    Migraine headache    2x/mo   Orthodontics    braces   Raynaud's syndrome     Past Surgical  History:  Procedure Laterality Date   ABDOMINAL HYSTERECTOMY     APPENDECTOMY     BREAST SURGERY     BREATH TEK H PYLORI N/A 08/27/2014   Procedure: BREATH TEK H PYLORI;  Surgeon: Pedro Earls, MD;  Location: [facility];  Service: General;  Laterality: N/A;   CATARACT EXTRACTION W/PHACO Left 09/20/2019   Procedure: CATARACT EXTRACTION PHACO AND INTRAOCULAR LENS PLACEMENT (IOC) LEFT 0.74 00:12.3 6.0%;  Surgeon: Leandrew Koyanagi, MD;  Location: [facility];  Service: Ophthalmology;  Laterality: Left;   CATARACT EXTRACTION W/PHACO Right 10/11/2019   Procedure: CATARACT EXTRACTION PHACO AND INTRAOCULAR LENS PLACEMENT (IOC)  RIGHT 1.55 00:24.1 6.4%;  Surgeon: Leandrew Koyanagi, MD;  Location: [facility];  Service: Ophthalmology;  Laterality: Right;  diabetic   COLONOSCOPY WITH PROPOFOL N/A 02/17/2019   Procedure: COLONOSCOPY WITH PROPOFOL;  Surgeon: Lucilla Lame, MD;  Location: [place];  Service: Endoscopy;  Laterality: N/A;   HIATAL HERNIA REPAIR  11/05/2014   Procedure: LAPAROSCOPIC REPAIR OF HIATAL HERNIA;  Surgeon: Johnathan Hausen, MD;  Location: [facility];  Service: General;;   LAPAROSCOPIC GASTRIC SLEEVE RESECTION N/A 11/05/2014   Procedure: LAPAROSCOPIC GASTRIC SLEEVE RESECTION;  Surgeon: Johnathan Hausen, MD;  Location: [facility];  Service: General;  Laterality: N/A;   POLYPECTOMY  02/17/2019   Procedure: POLYPECTOMY;  Surgeon: Lucilla Lame, MD;  Location: [facility];  Service: Endoscopy;;   REDUCTION MAMMAPLASTY Bilateral 1997   UPPER GI  ENDOSCOPY  11/05/2014   Procedure: UPPER GI ENDOSCOPY;  Surgeon: Johnathan Hausen, MD;  Location: [facility];  Service: General;;    Current Medications: Current Meds  Medication Sig   amLODipine (NORVASC) 10 MG tablet TAKE 1 TABLET BY MOUTH DAILY.   aspirin EC 81 MG tablet    Calcium Carb-Cholecalciferol (CALCIUM-VITAMIN D3) 500-400 MG-UNIT TABS Take 1 tablet by mouth 3 (three) times daily.   cyanocobalamin 500 MCG tablet  Take 1 mcg by mouth every morning.   ferrous gluconate ([employer] IRON) 240 (27 FE) MG tablet Take 1 tablet (240 mg total) by mouth 3 (three) times daily with meals.   Fluocinolone Acetonide Body 0.01 % OIL Apply 1 application topically to damp skin after shower daily   FLUoxetine (PROZAC) 10 MG capsule TAKE 1 CAPSULE BY MOUTH DAILY.   gabapentin (NEURONTIN) 600 MG tablet TAKE 1 & 1/2 TABLETS (900MG ) BY MOUTH AT BEDTIME   hydrOXYzine (ATARAX/VISTARIL) 10 MG tablet TAKE 1 TABLET BY MOUTH EVERY 8 HOURS AS NEEDED   ketoconazole (NIZORAL) 2 % cream APPLY TO THE AFFECTED AREA(S) TWO TIMES DAILY UNTIL CLEAR   losartan (COZAAR) 100 MG tablet TAKE 1 TABLET (100 MG TOTAL) BY MOUTH DAILY.   lubiprostone (AMITIZA) 8 MCG capsule Take 1 capsule (8 mcg total) by mouth 2 (two) times daily with a meal.   Multiple Vitamins-Minerals (MULTIVITAMIN ADULTS PO) Take 1 tablet by mouth daily.   nitroGLYCERIN (NITROGLYN) 2 % ointment Apply 0.5 inches topically every 6 (six) hours.   nystatin powder Apply topically 4 (four) times daily.   omeprazole (PRILOSEC) 20 MG capsule TAKE 1 CAPSULE BY MOUTH TWICE DAILY   ondansetron (ZOFRAN-ODT) 4 MG disintegrating tablet TAKE 1 TABLET BY MOUTH EVERY 8 HOURS AS NEEDED FOR NAUSEA OR VOMITING   Rimegepant Sulfate (NURTEC) 75 MG TBDP Take 1 tablet by mouth daily as needed.   rosuvastatin (CRESTOR) 10 MG tablet TAKE 1 TABLET BY MOUTH DAILY IN PLACE OF PRAVASTATIN   Ruxolitinib Phosphate (OPZELURA) 1.5 % CREA Apply 1 each topically at bedtime.   Semaglutide, 1 MG/DOSE, 4 MG/3ML SOPN Inject 1 mg as directed once a week.   triamcinolone cream (KENALOG) 0.1 % Apply topically 2 (two) times daily.   zolpidem (AMBIEN) 10 MG tablet TAKE 1 TABLET BY MOUTH AT BEDTIME.     Allergies:   Patient has no known allergies.   Social History   Socioeconomic History   Marital status: Single    Spouse name: Not on file   Number of children: 3   Years of education: Not on file   Highest education  level: Associate degree: occupational, Hotel manager, or vocational program  Occupational History   Not on file  Tobacco Use   Smoking status: Never   Smokeless tobacco: Never  Vaping Use   Vaping Use: Never used  Substance and Sexual Activity   Alcohol use: No    Alcohol/week: 0.0 standard drinks    Comment: occasional    Drug use: No   Sexual activity: Yes  Other Topics Concern   Not on file  Social History Narrative   Not on file   Social Determinants of Health   Financial Resource Strain: Not on file  Food Insecurity: Not on file  Transportation Needs: Not on file  Physical Activity: Not on file  Stress: Not on file  Social Connections: Not on file     Family History: The patient's family history includes Breast cancer (age of onset: 66) in her maternal grandmother; Diabetes in  her paternal uncle; Hypertension in an other family member; Stroke in her father.  ROS:   Please see the history of present illness.     All other systems reviewed and are negative.  EKGs/Labs/Other Studies Reviewed:    The following studies were reviewed today:   EKG:  EKG is  ordered today.  The ekg ordered today demonstrates normal sinus rhythm,  Recent Labs: 02/11/2021: TSH 1.290 03/11/2021: ALT 15; BUN 18; Creat 0.81; Hemoglobin 11.8; Platelets 349; Potassium 4.3; Sodium 138  Recent Lipid Panel    Component Value Date/Time   CHOL 128 03/11/2021 1352   CHOL 160 05/22/2014 0736   TRIG 44 03/11/2021 1352   TRIG 111 05/22/2014 0736   HDL 57 03/11/2021 1352   HDL 34 (L) 05/22/2014 0736   CHOLHDL 2.2 03/11/2021 1352   VLDL 5 04/16/2020 0426   VLDL 22 05/22/2014 0736   LDLCALC 58 03/11/2021 1352   LDLCALC 104 (H) 05/22/2014 0736     Risk Assessment/Calculations:          Physical Exam:    VS:  BP 110/70 (BP Location: Left Arm, Patient Position: Sitting, Cuff Size: Normal)    Pulse 70    Ht 5' 2.5" (1.588 m)    Wt 143 lb (64.9 kg)    SpO2 98%    BMI 25.74 kg/m     Wt Readings  from Last 3 Encounters:  10/09/21 143 lb (64.9 kg)  07/08/21 147 lb (66.7 kg)  03/11/21 140 lb (63.5 kg)     GEN:  Well nourished, well developed in no acute distress HEENT: Normal NECK: No JVD; No carotid bruits LYMPHATICS: No lymphadenopathy CARDIAC: RRR, no murmurs, rubs, gallops RESPIRATORY:  Clear to auscultation without rales, wheezing or rhonchi  ABDOMEN: Soft, non-tender, non-distended MUSCULOSKELETAL:  No edema; No deformity  SKIN: Warm and dry NEUROLOGIC:  Alert and oriented x 3 PSYCHIATRIC:  Normal affect   ASSESSMENT:    1. TIA (transient ischemic attack)   2. Primary hypertension   3. Pure hypercholesterolemia    PLAN:    In order of problems listed above:  History of TIA, echocardiogram 03/2020 showed normal systolic and diastolic function, EF 23%.  Outside study/cardiac monitor did not show any evidence for A-fib or flutter.  Continue aspirin, Crestor.  Cholesterol controlled on current dose of Crestor.   Hypertension, BP controlled.  Continue losartan, amlodipine. Hyperlipidemia, cholesterol controlled, continue Crestor.  Follow-up as needed         Medication Adjustments/Labs and Tests Ordered: Current medicines are reviewed at length with the patient today.  Concerns regarding medicines are outlined above.  Orders Placed This Encounter  Procedures   EKG 12-Lead   No orders of the defined types were placed in this encounter.   Patient Instructions  Medication Instructions:  Your physician recommends that you continue on your current medications as directed. Please refer to the Current Medication list given to you today.  *If you need a refill on your cardiac medications before your next appointment, please call your pharmacy*   Lab Work: None ordered If you have labs (blood work) drawn today and your tests are completely normal, you will receive your results only by: [place] (if you have MyChart) OR A paper copy in the mail If you  have any lab test that is abnormal or we need to change your treatment, we will call you to review the results.   Testing/Procedures: None ordered   Follow-Up: At [facility], you and  your health needs are our priority.  As part of our continuing mission to provide you with exceptional heart care, we have created designated Provider Care Teams.  These Care Teams include your primary Cardiologist (physician) and Advanced Practice Providers (APPs -  Physician Assistants and Nurse Practitioners) who all work together to provide you with the care you need, when you need it.  We recommend signing up for the patient portal called "MyChart".  Sign up information is provided on this After Visit Summary.  MyChart is used to connect with patients for Virtual Visits (Telemedicine).  Patients are able to view lab/test results, encounter notes, upcoming appointments, etc.  Non-urgent messages can be sent to your provider as well.   To learn more about what you can do with MyChart, go to [email or web address].    Your next appointment:   Follow up as needed   The format for your next appointment:   In Person  Provider:   You may see Kate Sable, MD or one of the following Advanced Practice Providers on your designated Care Team:   Murray Hodgkins, NP Christell Faith, PA-C Cadence Kathlen Mody, [place]    Other Instructions     Signed, Kate Sable, MD  10/09/2021 12:15 PM    [place]

## 2021-10-16 ENCOUNTER — Other Ambulatory Visit: Payer: Self-pay

## 2021-10-16 ENCOUNTER — Encounter: Payer: Self-pay | Admitting: Family Medicine

## 2021-10-16 DIAGNOSIS — F330 Major depressive disorder, recurrent, mild: Secondary | ICD-10-CM

## 2021-10-16 DIAGNOSIS — E1121 Type 2 diabetes mellitus with diabetic nephropathy: Secondary | ICD-10-CM

## 2021-10-16 DIAGNOSIS — F5104 Psychophysiologic insomnia: Secondary | ICD-10-CM

## 2021-10-16 DIAGNOSIS — Z9884 Bariatric surgery status: Secondary | ICD-10-CM

## 2021-10-16 DIAGNOSIS — G43009 Migraine without aura, not intractable, without status migrainosus: Secondary | ICD-10-CM

## 2021-10-16 MED ORDER — SEMAGLUTIDE (1 MG/DOSE) 4 MG/3ML ~~LOC~~ SOPN
1.0000 mg | PEN_INJECTOR | SUBCUTANEOUS | 1 refills | Status: DC
  Filled 2021-10-16: qty 9, 84d supply, fill #0
  Filled 2022-01-08: qty 9, 84d supply, fill #1

## 2021-10-16 MED ORDER — NURTEC 75 MG PO TBDP
1.0000 | ORAL_TABLET | Freq: Every day | ORAL | 1 refills | Status: DC | PRN
  Filled 2021-10-16: qty 8, 15d supply, fill #0
  Filled 2022-01-08: qty 8, 15d supply, fill #1

## 2021-10-16 MED ORDER — HYDROXYZINE HCL 10 MG PO TABS
ORAL_TABLET | Freq: Three times a day (TID) | ORAL | 1 refills | Status: DC | PRN
  Filled 2021-10-16: qty 90, 30d supply, fill #0
  Filled 2022-01-09: qty 90, 30d supply, fill #1

## 2021-10-16 MED ORDER — ONDANSETRON 4 MG PO TBDP
ORAL_TABLET | Freq: Three times a day (TID) | ORAL | 0 refills | Status: DC | PRN
  Filled 2021-10-16: qty 20, 7d supply, fill #0

## 2021-10-16 MED ORDER — ZOLPIDEM TARTRATE 10 MG PO TABS
ORAL_TABLET | Freq: Every day | ORAL | 0 refills | Status: DC
  Filled 2021-10-16: qty 90, 90d supply, fill #0

## 2021-10-20 ENCOUNTER — Telehealth: Payer: Self-pay

## 2021-10-20 NOTE — Telephone Encounter (Signed)
Spoke with patient regarding 2023 Shannon Soto benefits. This is a covered benefits but she will be responsible for 20%. Due to cost of laser I gave her a estimate of $110-170 per treatment but she is eligible for the rebate coupon. Patient would like to think about this and call me back.

## 2021-10-27 NOTE — Progress Notes (Addendum)
Name: Shannon Soto   MRN: [id number]    DOB: 10/14/1970   Date:10/28/2021       Progress Note  Subjective  Chief Complaint  Follow Up  HPI  Bariatric Surgery: she had sleeve surgery done by Dr. Hassell Done on March 14th, 2016, she has achieved her goal weight of 145 lbs, today weight is up to 150 lbs, she states she is very stressed, skipping meals and not as active since COVI-19  Weight prior to surgery was 242 lbs She is doing well, hgbA1C started to go up, and we started her on Ozempic 02/2017 weight was in the 140 range until Fall 2020 when weight went up to 150 lbs on her last visit, since Feb she lost down to 137 lbs by increasing her walks, weight was stable however when admitted to [facility] for possible TIA in August she lost her appetite and weight dropped to 131 lbs. She has been gaining weight again gradually ,she is back on Ozempic 1 mg and weight has been stable in the low 140's lbs , she would like to go down to 130 lbs    Eczema: flaring up again, Dr. Rochele Pages recommend Dupixent but she is not ready to start it   Vitiligo: seen by Dr. Nicole Kindred, using a new topical medication but some of the spots are getting darker, but down to Opzelura once a day instead of BID . No side effects of medication , she was approved for laser therapy but also still thinking about it    GERD: she has a long history of reflux, symptoms were controlled with Omeprazole 20 mg  BID, discussed long term use of PPI. She has not tried skipping doses yet.   Chronic Idiopathic constipation: she has a long history of constipation, unable to tolerate Miralax, took Linzess in the past but it caused burning in her stomach and explosive diarrhea multiple times a day. She is currently taking otc laxative on Friday mornings to have one bowel movement a week. She states she has cramping in the middle of the week. We will try low dose of Amitiza    Hyperlipidemia: .Reviewed last labs and LDL was at goal at 58 continue Rosuvastatin  . No myopathy    HTN: she is on Norvasc for Raynaud's and Losartan for microalbuminuria No chest pain or  dizziness. She has intermittent palpitation lasts a few seconds , unchanged, continue medication    Insomnia: she takes Ambien, she has been on medication for many years, she used to take high dose, now she is trying to make it last and at times takes half . She is going to bed around 11 pm. Taking half of hydroxizine around 8 pm and Ambien before bed. She states taking both has helped her gets sleep and fall asleep, she was sleeping around 6 hours per night. Stable    DMII with peripheral neuropathy and nephropathy:  off metformin since bariatric surgery in 10/2014 , no polyphagia, but has episodes of polydipsia but no polyuria  but has nocturia times once  every night.. On Gabapentin seems to help with neuropathy, taking one and a half 600 mg tablets at night only,  She is back on Losartan , because last urine micro was  100. A1C has been within normal for a long time , she has been tolerating Ozempic currently at 1 mg weekly , no side effects , she does not want to stop medication since it helps keep her weight down  Major depression: chronic symptoms since her son was born. She has been taking Fluoxetine, one daily, occasionally takes one extra pill during the day, discussed taking hydroxizine prn for anxiety . No side effects. Denies crying spells, or anhedonia, just worries constantly about her son that has special needs .She denies suicidal thoughts or ideation . Continue current regiment . She has a good support system. Unchanged    Migraines: she has tried multiple medications, but could not tolerate,  episodes about 1-2 times months. Episodes are described as  pain is behind her eyes, throbbing like, associated with nausea when she has a severe episode and photophobia. It can last up to 4 hours with medication ( Tylenol  ) she is off nsaid's and has been taking Nurtec prn if she takes it  right away. She still has medication at home . Unchanged   Raynaud's: symptoms of  fingers on both hands gets white , painful and numb, not sure of the triggers at this time.  Worse this time of the year due to cold weather. She wears gloves when cold   History of TIA: admitted back in August 2021 with left middle finger pain, intense, seen by neurologist, questionable TIA versus severe Raynaud's , symptoms lasted days but resolved, now symptoms still happens but associated with change in color and likely from Raynaud's.   She has seen Neurologist, Rheumatologist and Cardiologist, however lost to follow up with cardiologist, Dr. Saralyn Pilar, she is now seeing Dr. Mylo Red and will follow up yearly   Growth on forehead: going on for the past few months, initially tender, not growing now, no change of skin color.  Patient Active Problem List   Diagnosis Date Noted   Headache disorder 09/10/2020   TIA (transient ischemic attack) 05/06/2020   Numbness and tingling in left hand 04/16/2020   Encounter for screening colonoscopy    Polyp of sigmoid colon    Controlled type 2 diabetes mellitus with neuropathy ([place]) 05/12/2016   Raynaud's phenomenon 05/12/2016   Intertrigo 09/06/2015   Migraine headache without aura 02/28/2015   History of obesity 02/28/2015   Hyperlipidemia 02/28/2015   GERD (gastroesophageal reflux disease) 02/28/2015   Vitamin D deficiency 02/28/2015   Chronic constipation 02/28/2015   Chronic insomnia 02/28/2015   Peripheral neuropathy 02/28/2015   Vitiligo 02/28/2015   Lower leg mass 02/28/2015   Depression with anxiety 02/28/2015   History of bariatric surgery 11/05/2014    Past Surgical History:  Procedure Laterality Date   ABDOMINAL HYSTERECTOMY     APPENDECTOMY     BREAST SURGERY     BREATH TEK H PYLORI N/A 08/27/2014   Procedure: BREATH TEK H PYLORI;  Surgeon: Pedro Earls, MD;  Location: [facility];  Service: General;  Laterality: N/A;   CATARACT EXTRACTION  W/PHACO Left 09/20/2019   Procedure: CATARACT EXTRACTION PHACO AND INTRAOCULAR LENS PLACEMENT (IOC) LEFT 0.74 00:12.3 6.0%;  Surgeon: Leandrew Koyanagi, MD;  Location: [place] [facility];  Service: Ophthalmology;  Laterality: Left;   CATARACT EXTRACTION W/PHACO Right 10/11/2019   Procedure: CATARACT EXTRACTION PHACO AND INTRAOCULAR LENS PLACEMENT (IOC)  RIGHT 1.55 00:24.1 6.4%;  Surgeon: Leandrew Koyanagi, MD;  Location: [place];  Service: Ophthalmology;  Laterality: Right;  diabetic   COLONOSCOPY WITH PROPOFOL N/A 02/17/2019   Procedure: COLONOSCOPY WITH PROPOFOL;  Surgeon: Lucilla Lame, MD;  Location: [place];  Service: Endoscopy;  Laterality: N/A;   HIATAL HERNIA REPAIR  11/05/2014   Procedure: LAPAROSCOPIC REPAIR OF HIATAL HERNIA;  Surgeon: Rodman Key  Hassell Done, MD;  Location: [facility];  Service: General;;   LAPAROSCOPIC GASTRIC SLEEVE RESECTION N/A 11/05/2014   Procedure: LAPAROSCOPIC GASTRIC SLEEVE RESECTION;  Surgeon: Johnathan Hausen, MD;  Location: [facility];  Service: General;  Laterality: N/A;   POLYPECTOMY  02/17/2019   Procedure: POLYPECTOMY;  Surgeon: Lucilla Lame, MD;  Location: [place];  Service: Endoscopy;;   REDUCTION MAMMAPLASTY Bilateral 1997   UPPER GI ENDOSCOPY  11/05/2014   Procedure: UPPER GI ENDOSCOPY;  Surgeon: Johnathan Hausen, MD;  Location: [facility];  Service: General;;    Family History  Problem Relation Age of Onset   Diabetes Paternal Uncle    Stroke Father    Hypertension Other    Breast cancer Maternal Grandmother 72    Social History   Tobacco Use   Smoking status: Never   Smokeless tobacco: Never  Substance Use Topics   Alcohol use: No    Alcohol/week: 0.0 standard drinks    Comment: occasional      Current Outpatient Medications:    amLODipine (NORVASC) 10 MG tablet, TAKE 1 TABLET BY MOUTH DAILY., Disp: 90 tablet, Rfl: 1   aspirin EC 81 MG tablet, , Disp: , Rfl:    Calcium Carb-Cholecalciferol (CALCIUM-VITAMIN D3) 500-400  MG-UNIT TABS, Take 1 tablet by mouth 3 (three) times daily., Disp: , Rfl:    cyanocobalamin 500 MCG tablet, Take 1 mcg by mouth every morning., Disp: , Rfl:    ferrous gluconate ([employer] IRON) 240 (27 FE) MG tablet, Take 1 tablet (240 mg total) by mouth 3 (three) times daily with meals., Disp: 30 tablet, Rfl: 0   Fluocinolone Acetonide Body 0.01 % OIL, Apply 1 application topically to damp skin after shower daily, Disp: 118.28 mL, Rfl: 3   FLUoxetine (PROZAC) 10 MG capsule, TAKE 1 CAPSULE BY MOUTH DAILY., Disp: 90 capsule, Rfl: 1   gabapentin (NEURONTIN) 600 MG tablet, TAKE 1 & 1/2 TABLETS ('900MG'$ ) BY MOUTH AT BEDTIME, Disp: 135 tablet, Rfl: 1   hydrOXYzine (ATARAX) 10 MG tablet, TAKE 1 TABLET BY MOUTH EVERY 8 HOURS AS NEEDED, Disp: 90 tablet, Rfl: 1   ketoconazole (NIZORAL) 2 % cream, APPLY TO THE AFFECTED AREA(S) TWO TIMES DAILY UNTIL CLEAR, Disp: 60 g, Rfl: 3   losartan (COZAAR) 100 MG tablet, TAKE 1 TABLET (100 MG TOTAL) BY MOUTH DAILY., Disp: 90 tablet, Rfl: 1   lubiprostone (AMITIZA) 8 MCG capsule, Take 1 capsule (8 mcg total) by mouth 2 (two) times daily with a meal., Disp: 180 capsule, Rfl: 1   Multiple Vitamins-Minerals (MULTIVITAMIN ADULTS PO), Take 1 tablet by mouth daily., Disp: , Rfl:    nitroGLYCERIN (NITROGLYN) 2 % ointment, Apply 0.5 inches topically every 6 (six) hours., Disp: 30 g, Rfl: 0   nystatin powder, Apply topically 4 (four) times daily., Disp: 60 g, Rfl: 2   omeprazole (PRILOSEC) 20 MG capsule, TAKE 1 CAPSULE BY MOUTH TWICE DAILY, Disp: 180 capsule, Rfl: 1   ondansetron (ZOFRAN-ODT) 4 MG disintegrating tablet, TAKE 1 TABLET BY MOUTH EVERY 8 HOURS AS NEEDED FOR NAUSEA OR VOMITING, Disp: 20 tablet, Rfl: 0   Rimegepant Sulfate (NURTEC) 75 MG TBDP, Take 1 tablet by mouth daily as needed., Disp: 8 tablet, Rfl: 1   rosuvastatin (CRESTOR) 10 MG tablet, TAKE 1 TABLET BY MOUTH DAILY IN PLACE OF PRAVASTATIN, Disp: 90 tablet, Rfl: 1   Ruxolitinib Phosphate (OPZELURA) 1.5 % CREA, Apply 1  each topically at bedtime., Disp: 60 g, Rfl: 0   Semaglutide, 1 MG/DOSE, 4 MG/3ML SOPN, Inject  1 mg as directed once a week., Disp: 9 mL, Rfl: 1   triamcinolone cream (KENALOG) 0.1 %, Apply topically 2 (two) times daily., Disp: 454 g, Rfl: 0   zolpidem (AMBIEN) 10 MG tablet, TAKE 1 TABLET BY MOUTH AT BEDTIME., Disp: 90 tablet, Rfl: 0  No Known Allergies  I personally reviewed active problem list, medication list, allergies, family history, social history, health maintenance with the patient/caregiver today.   ROS  Constitutional: Negative for fever or weight change.  Respiratory: Negative for cough and shortness of breath.   Cardiovascular: Negative for chest pain or palpitations.  Gastrointestinal: Negative for abdominal pain, no bowel changes.  Musculoskeletal: Negative for gait problem or joint swelling.  Skin: Negative for rash.  Neurological: Negative for dizziness or headache.  No other specific complaints in a complete review of systems (except as listed in HPI above).   Objective  Vitals:   10/28/21 1309  BP: 120/68  Pulse: 72  Resp: 16  Weight: 143 lb (64.9 kg)  Height: '5\' 2"'$  (1.575 m)    Body mass index is 26.16 kg/m.  Physical Exam  Constitutional: Patient appears well-developed and well-nourished.  No distress.  HEENT: head atraumatic, normocephalic, pupils equal and reactive to light,  neck supple Cardiovascular: Normal rate, regular rhythm and normal heart sounds.  No murmur heard. No BLE edema. Pulmonary/Chest: Effort normal and breath sounds normal. No respiratory distress. Abdominal: Soft.  There is no tenderness. Psychiatric: Patient has a normal mood and affect. behavior is normal. Judgment and thought content normal.   PHQ2/9: Depression screen [facility] 2/9 10/28/2021 07/08/2021 03/11/2021 10/22/2020 06/18/2020  Decreased Interest 0 0 1 0 0  Down, Depressed, Hopeless 0 0 0 0 0  PHQ - 2 Score 0 0 1 0 0  Altered sleeping 0 0 0 0 1  Tired, decreased energy 0 0  '1 3 1  '$ Change in appetite 0 0 0 0 1  Feeling bad or failure about yourself  0 0 0 0 0  Trouble concentrating 0 0 0 0 0  Moving slowly or fidgety/restless 0 0 0 0 0  Suicidal thoughts 0 0 0 0 0  PHQ-9 Score 0 0 '2 3 3  '$ Difficult doing work/chores - - - - Somewhat difficult  Some recent data might be hidden    phq 9 is negative   Fall Risk: Fall Risk  10/28/2021 07/08/2021 03/11/2021 10/22/2020 06/18/2020  Falls in the past year? 1 0 0 0 0  Number falls in past yr: 0 0 0 0 0  Injury with Fall? 0 0 0 0 0  Risk for fall due to : No Fall Risks No Fall Risks - - -  Follow up Falls prevention discussed Falls prevention discussed Falls prevention discussed - -      Functional Status Survey: Is the patient deaf or have difficulty hearing?: No Does the patient have difficulty seeing, even when wearing glasses/contacts?: No Does the patient have difficulty concentrating, remembering, or making decisions?: No Does the patient have difficulty walking or climbing stairs?: No Does the patient have difficulty dressing or bathing?: No Does the patient have difficulty doing errands alone such as visiting a doctor's office or shopping?: No    Assessment & Plan  1. Frontal bone deformity  - DG Facial Bones 1-2 Views; Future  2. Migraine without aura and without status migrainosus, not intractable   3. Type 2 diabetes with nephropathy (HCC)  At goal   4. Mild recurrent major depression ([place])  5. Raynaud's disease without gangrene   6. B12 deficiency   7. Hypertension, benign   8. Pure hypercholesterolemia   9. Chronic insomnia   10. Vitamin D deficiency   11. History of bariatric surgery

## 2021-10-28 ENCOUNTER — Ambulatory Visit (INDEPENDENT_AMBULATORY_CARE_PROVIDER_SITE_OTHER): Payer: No Typology Code available for payment source | Admitting: Family Medicine

## 2021-10-28 ENCOUNTER — Other Ambulatory Visit: Payer: Self-pay

## 2021-10-28 ENCOUNTER — Encounter: Payer: Self-pay | Admitting: Family Medicine

## 2021-10-28 VITALS — BP 120/68 | HR 72 | Resp 16 | Ht 62.0 in | Wt 143.0 lb

## 2021-10-28 DIAGNOSIS — E1121 Type 2 diabetes mellitus with diabetic nephropathy: Secondary | ICD-10-CM

## 2021-10-28 DIAGNOSIS — E559 Vitamin D deficiency, unspecified: Secondary | ICD-10-CM

## 2021-10-28 DIAGNOSIS — I7300 Raynaud's syndrome without gangrene: Secondary | ICD-10-CM

## 2021-10-28 DIAGNOSIS — F5104 Psychophysiologic insomnia: Secondary | ICD-10-CM

## 2021-10-28 DIAGNOSIS — G43009 Migraine without aura, not intractable, without status migrainosus: Secondary | ICD-10-CM | POA: Diagnosis not present

## 2021-10-28 DIAGNOSIS — Z9884 Bariatric surgery status: Secondary | ICD-10-CM

## 2021-10-28 DIAGNOSIS — E7800 Pure hypercholesterolemia, unspecified: Secondary | ICD-10-CM

## 2021-10-28 DIAGNOSIS — F330 Major depressive disorder, recurrent, mild: Secondary | ICD-10-CM

## 2021-10-28 DIAGNOSIS — M952 Other acquired deformity of head: Secondary | ICD-10-CM

## 2021-10-28 DIAGNOSIS — I10 Essential (primary) hypertension: Secondary | ICD-10-CM

## 2021-10-28 DIAGNOSIS — E538 Deficiency of other specified B group vitamins: Secondary | ICD-10-CM

## 2021-11-10 ENCOUNTER — Ambulatory Visit
Admission: RE | Admit: 2021-11-10 | Discharge: 2021-11-10 | Disposition: A | Discharge status: Home / Self Care | Payer: No Typology Code available for payment source | Source: Ambulatory Visit | Attending: Family Medicine | Admitting: Family Medicine

## 2021-11-10 ENCOUNTER — Ambulatory Visit
Admission: RE | Admit: 2021-11-10 | Discharge: 2021-11-10 | Disposition: A | Discharge status: Home / Self Care | Payer: No Typology Code available for payment source | Attending: Family Medicine | Admitting: Family Medicine

## 2021-11-10 DIAGNOSIS — M952 Other acquired deformity of head: Secondary | ICD-10-CM | POA: Diagnosis present

## 2021-11-13 IMAGING — MR MR HEAD W/O CM
11 of 12 series · 40 of 48 positions shown · non-contrast
Comparison: Plain head CT yesterday.

CLINICAL DATA: 49-year-old female with code stroke presentation
yesterday. Altered mental status and weakness.

EXAM:
MRI HEAD WITHOUT CONTRAST
TECHNIQUE: Multiplanar, multiecho pulse sequences of the brain and surrounding
structures were obtained without intravenous contrast.

[Series 5: DWI · axial · 4.0mm · 0.88mm/px · z∈[-94,+30]mm · 5 of 32 slices shown (1 of 6)]
[im 1/32]
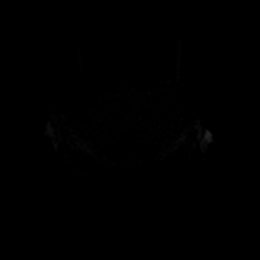
[im 8/32]
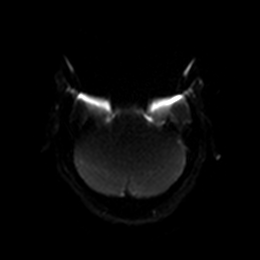
[im 16/32]
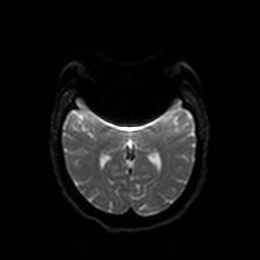
[im 24/32]
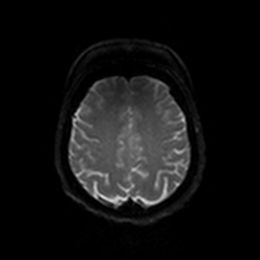
[im 32/32]
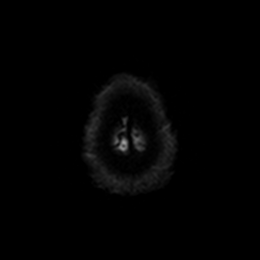

[Series 5: DWI · axial · 4.0mm · 0.88mm/px · z∈[-94,+30]mm · 4 of 32 slices shown (2 of 6)]
[im 1/32]
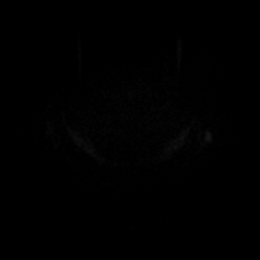
[im 11/32]
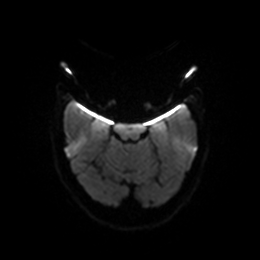
[im 21/32]
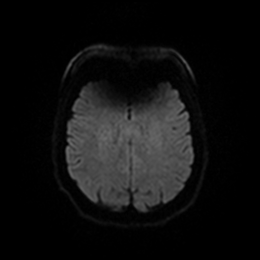
[im 32/32]
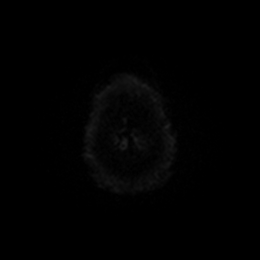

[Series 6: DWI · axial · 4.0mm · 0.88mm/px · z∈[-94,+30]mm · 4 of 32 slices shown (3 of 6)]
[im 1/32]
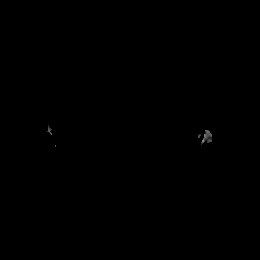
[im 11/32]
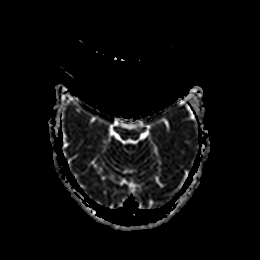
[im 21/32]
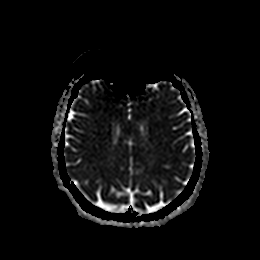
[im 32/32]
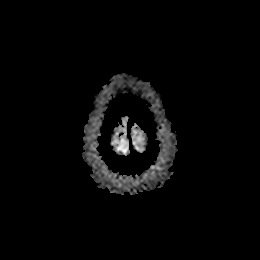

[Series 7: DWI · coronal · 4.0mm · 0.88mm/px · 4 of 32 slices shown (4 of 6)]
[im 1/32]
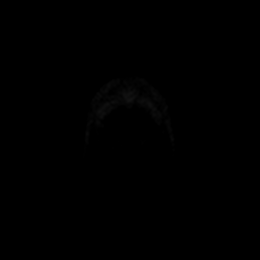
[im 11/32]
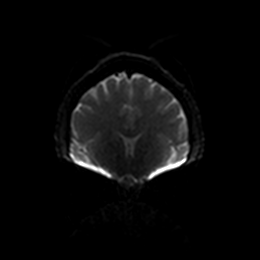
[im 21/32]
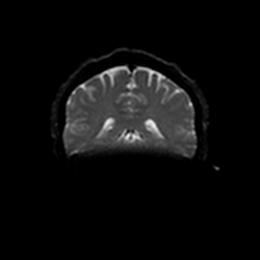
[im 32/32]
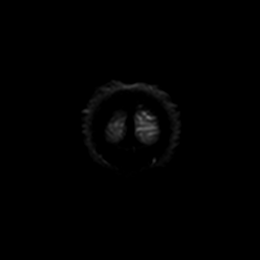

[Series 7: DWI · coronal · 4.0mm · 0.88mm/px · 4 of 32 slices shown (5 of 6)]
[im 1/32]
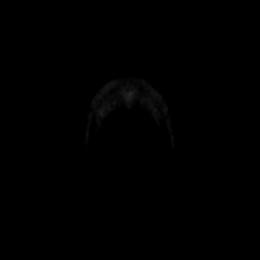
[im 11/32]
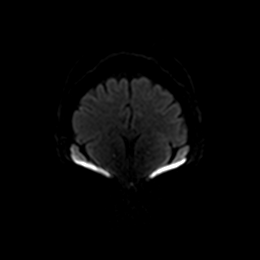
[im 21/32]
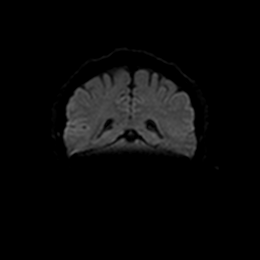
[im 32/32]
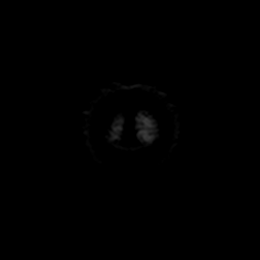

[Series 8: DWI · coronal · 4.0mm · 0.88mm/px · 4 of 32 slices shown (6 of 6)]
[im 1/32]
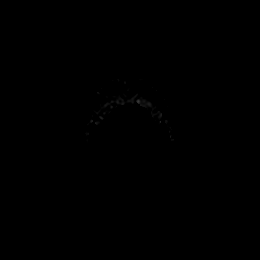
[im 11/32]
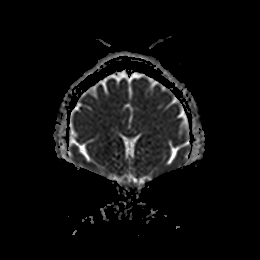
[im 21/32]
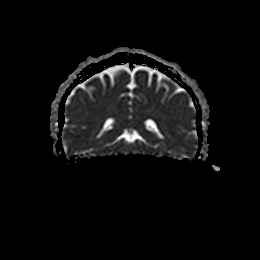
[im 32/32]
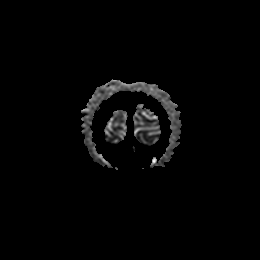

[Series 9: T1 · sagittal · 5.0mm · 0.94mm/px · 2 of 18 slices shown]
[im 1/18]
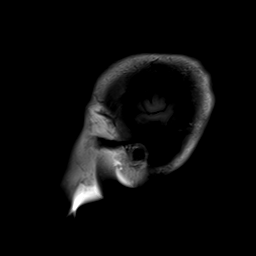
[im 18/18]
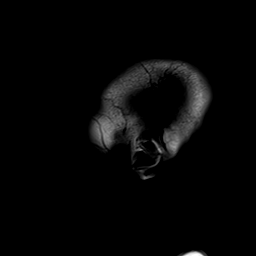

[Series 10: T2 · axial · 5.0mm · 0.72mm/px · z∈[-104,+40]mm · 3 of 25 slices shown (1 of 2)]
[im 1/25]
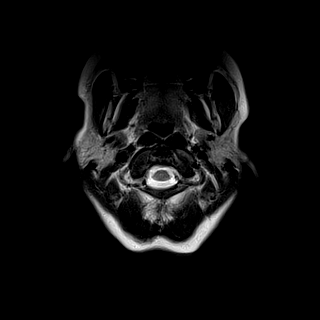
[im 13/25]
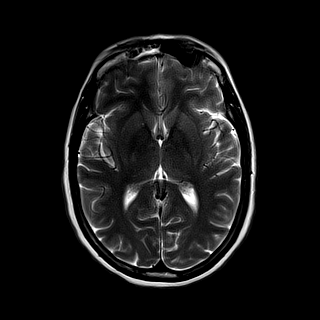
[im 25/25]
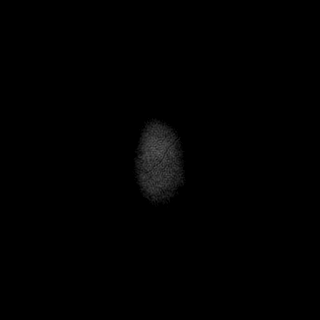

[Series 11: ax hemo · axial · 5.0mm · 0.86mm/px · z∈[-103,+40]mm · 3 of 25 slices shown]
[im 1/25]
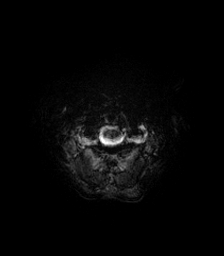
[im 13/25]
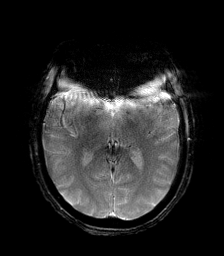
[im 25/25]
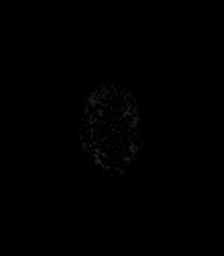

[Series 12: FLAIR · axial · 4.0mm · 0.43mm/px · z∈[-93,+30]mm · 4 of 32 slices shown]
[im 1/32]
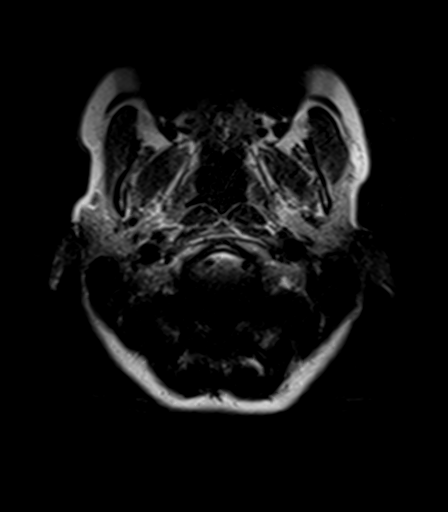
[im 11/32]
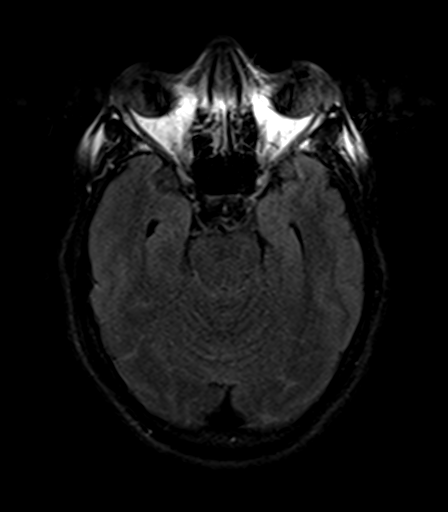
[im 21/32]
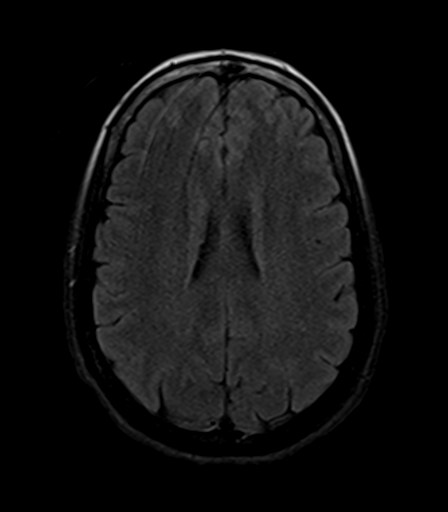
[im 32/32]
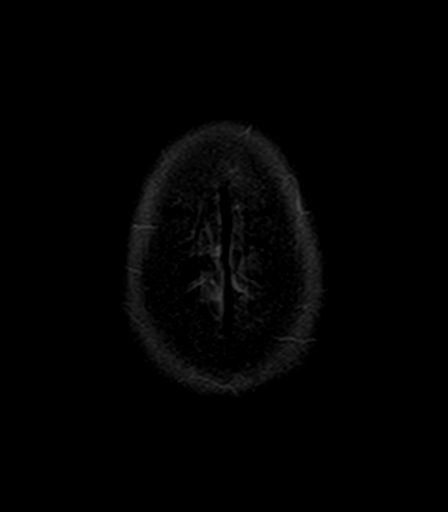

[Series 14: T2 · coronal · 5.0mm · 0.72mm/px · 3 of 24 slices shown (2 of 2)]
[im 1/24]
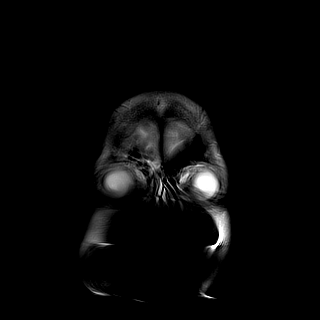
[im 12/24]
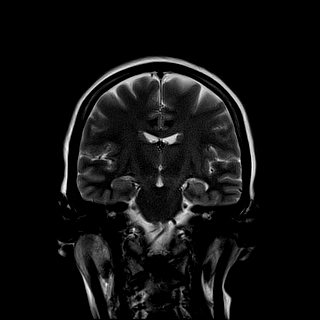
[im 24/24]
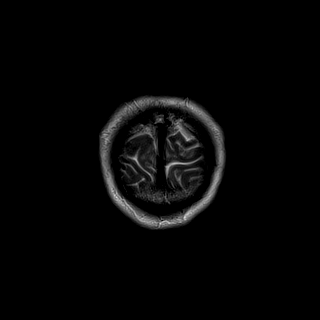

[40 of 48 positions shown; findings below may reference images not displayed]

FINDINGS: Brain: Metal susceptibility artifact likely from dental braces
hardware adversely affects portions of the study, especially DWI and
T2 * imaging, despite attempts to minimize the artifact.

No restricted diffusion is evident.

No midline shift, mass effect, or evidence of intracranial mass
lesion. No ventriculomegaly. No encephalomalacia is evident. No
chronic cerebral blood products are evident. Allowing for
susceptibility artifact gray and white matter signal appears within
normal limits for age throughout the brain. Negative pituitary and
cervicomedullary junction.

Vascular: Major intracranial vascular flow voids are preserved.

Skull and upper cervical spine: Negative visible cervical spine and
bone marrow signal.

Sinuses/Orbits: Detail of the orbits and paranasal sinuses is
largely obscured. But visible portions are within normal limits.

Other: Mastoids are clear. Grossly normal visible internal auditory
structures.
IMPRESSION: No acute intracranial abnormality is evident. Allowing for
significant artifact from dental braces, the brain appears within
normal limits for age.

## 2021-11-13 IMAGING — MR MR MRA HEAD W/O CM
2 series · 21 of 37 positions shown · non-contrast
Comparison: Brain MRI today reported separately. Head CT yesterday.

CLINICAL DATA: 49-year-old female with code stroke presentation
yesterday. Altered mental status and weakness.

EXAM:
MRA HEAD WITHOUT CONTRAST
TECHNIQUE: Angiographic images of the Circle of Willis were obtained using MRA
technique without intravenous contrast.

[Series 1044: tumble · 0.34mm/px · 11 of 19 slices shown]
[im 1/19]
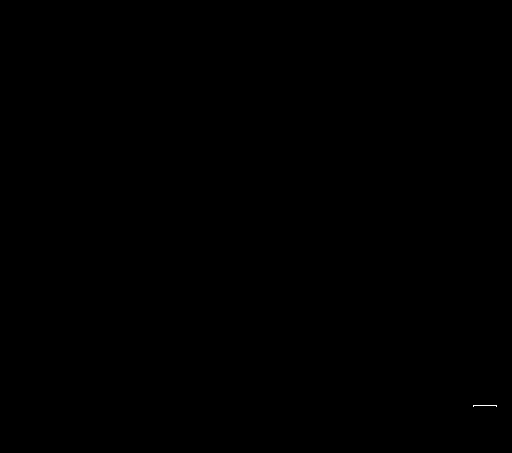
[im 2/19]
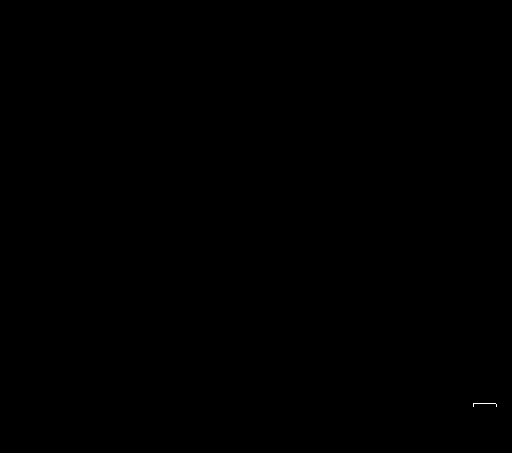
[im 3/19]
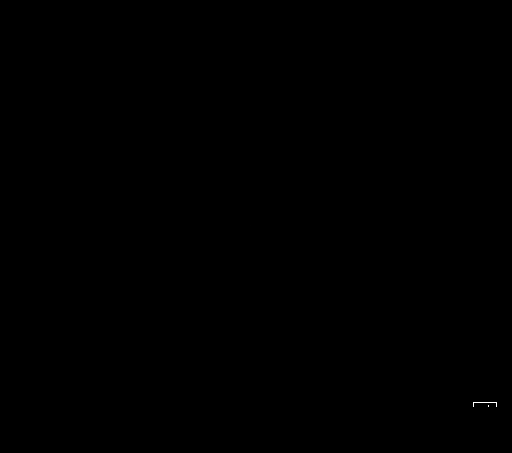
[im 4/19]
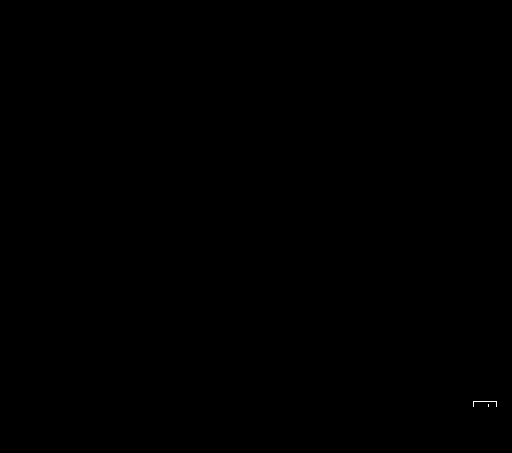
[im 6/19]
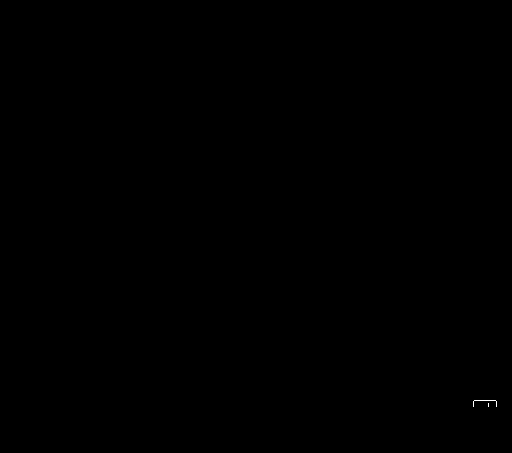
[im 9/19]
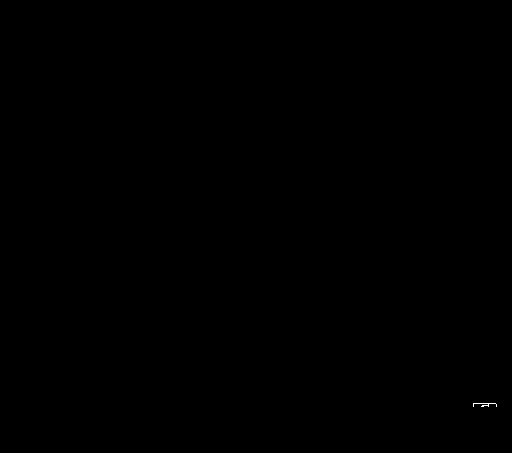
[im 10/19]
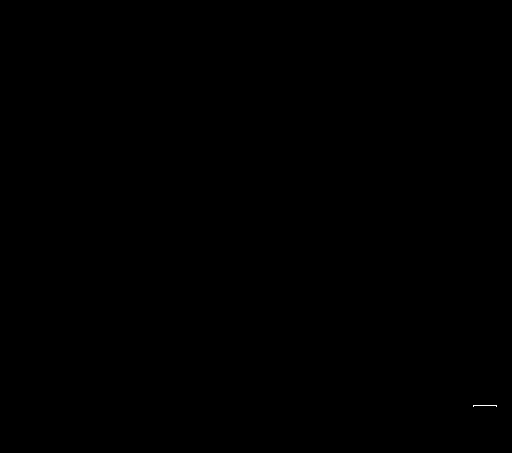
[im 11/19]
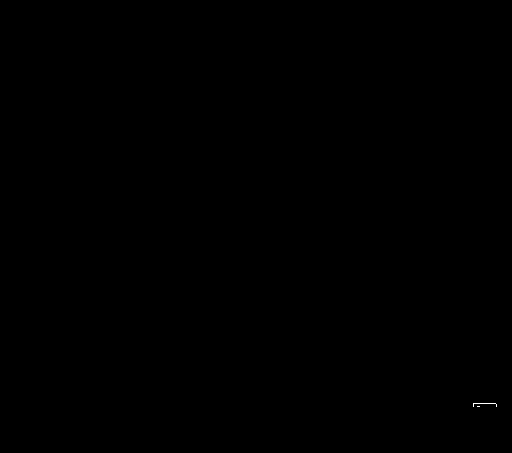
[im 14/19]
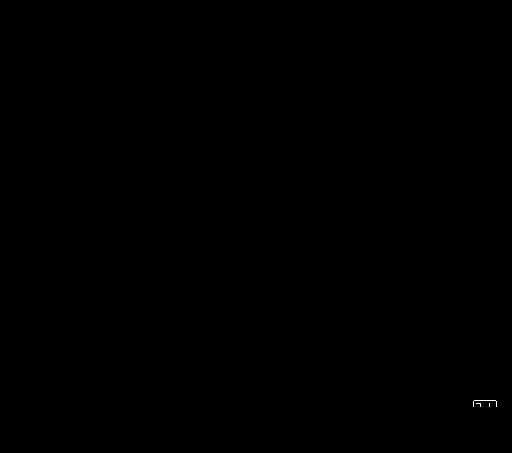
[im 16/19]
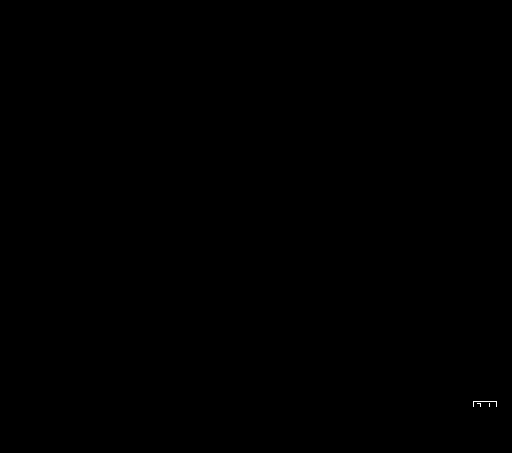
[im 18/19]
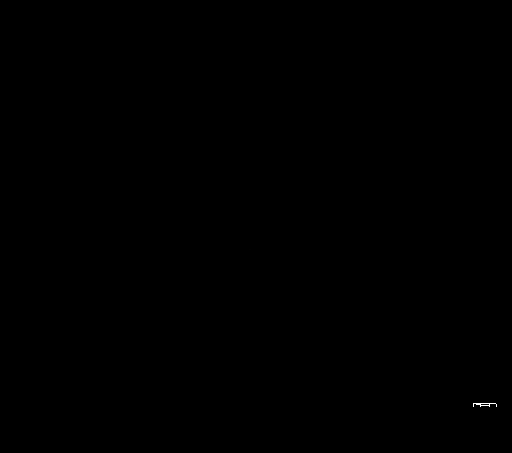

[Series 1048: rotate · 0.34mm/px · 10 of 18 slices shown]
[im 2/18]
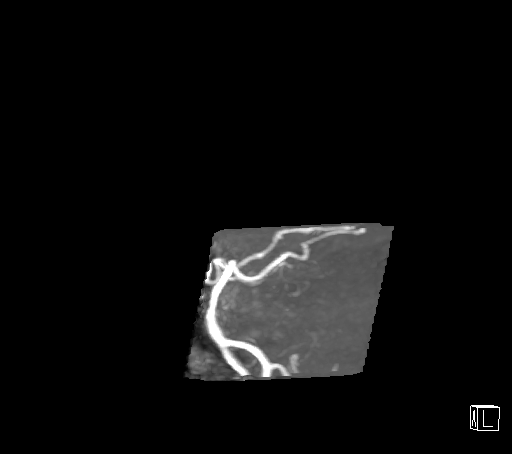
[im 4/18]
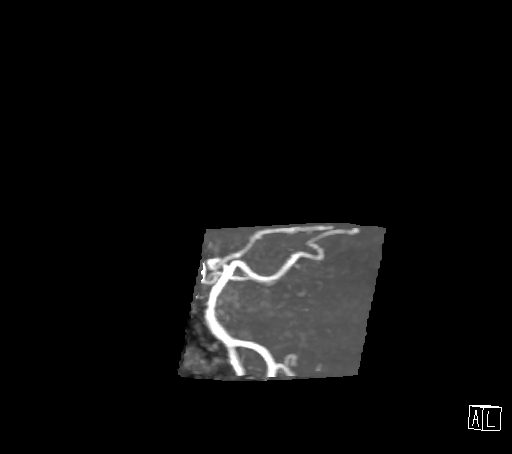
[im 6/18]
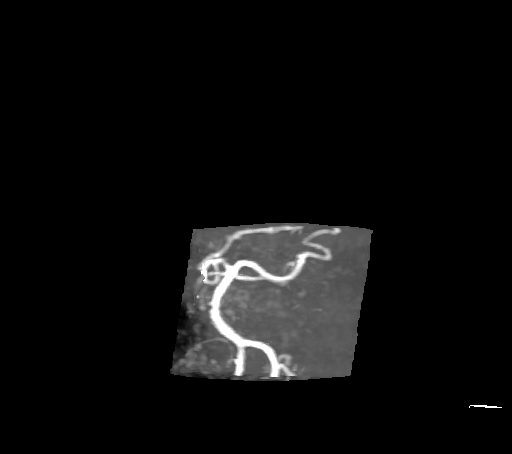
[im 8/18]
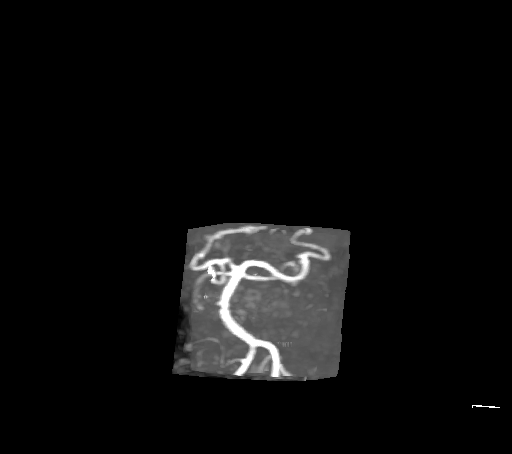
[im 10/18]
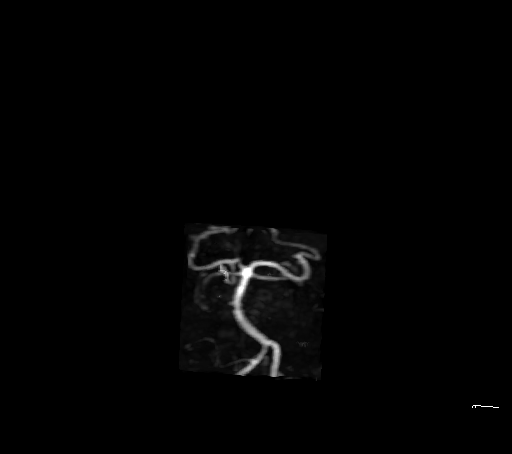
[im 11/18]
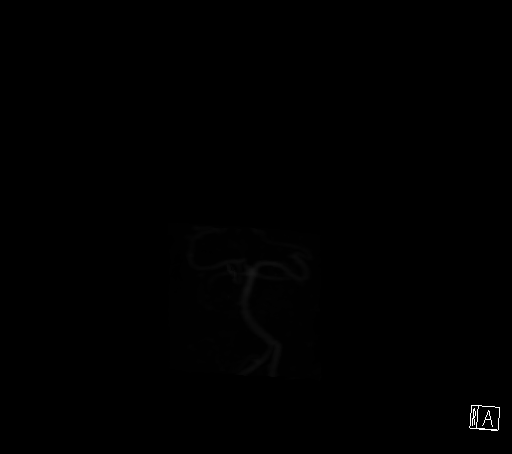
[im 13/18]
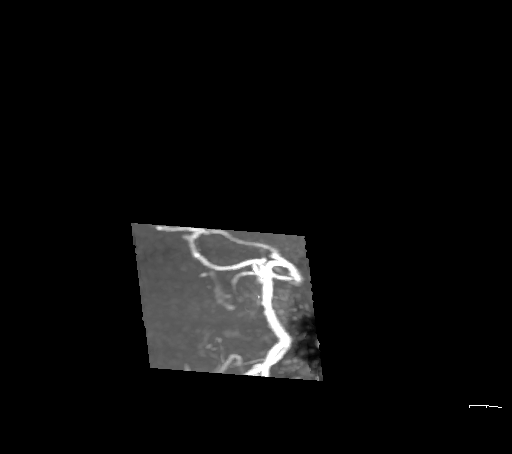
[im 15/18]
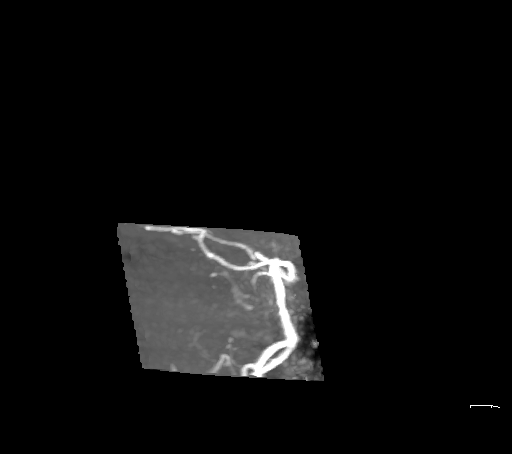
[im 16/18]
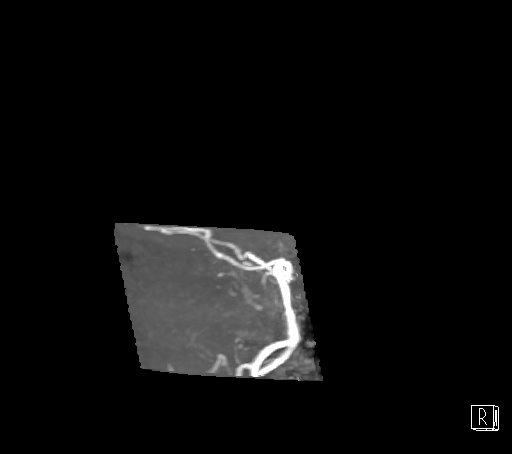
[im 17/18]
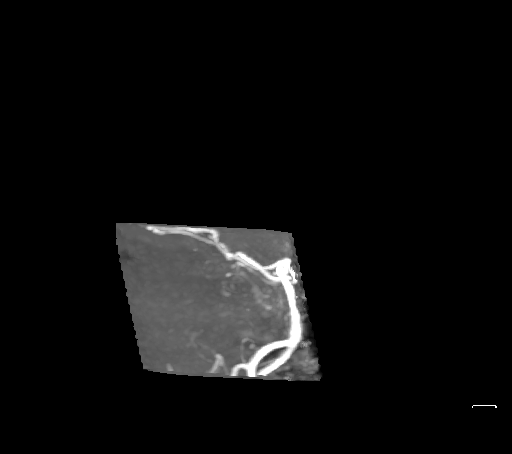

[21 of 37 positions shown; findings below may reference images not displayed]

FINDINGS: Metal susceptibility artifact from the dental braces also affects
this exam, although the study remains diagnostic.

There is antegrade flow in the posterior circulation with codominant
appearing distal vertebral arteries. Patent PICA origins. Patent
vertebrobasilar junction and basilar artery. Patent SCA and PCA
origins. A right posterior communicating artery is present with a
mildly tortuous right P1. The left posterior communicating artery
appears diminutive or absent. Bilateral PCA branches are within
normal limits.

Antegrade flow in both ICA siphons. No definite siphon stenosis.
Normal right posterior communicating artery origin. Patent carotid
termini. Patent MCA and ACA origins. The right A1 appears dominant,
the left diminutive. Some of the ACA branch detail is degraded by
susceptibility artifact, but visible ACA branches appear within
normal limits. Bilateral MCA M1 segments and MCA bifurcations are
patent with mild tortuosity. Visible bilateral MCA branches are
within normal limits.
IMPRESSION: Negative intracranial MRA, when allowing for susceptibility artifact
from dental braces.

## 2021-11-13 IMAGING — US US CAROTID DUPLEX BILAT
1 series · 13 of 24 positions shown · non-contrast
Comparison: None.

CLINICAL DATA: Left-sided paresthesias, hypertension, stroke
symptoms, diabetes

EXAM:
BILATERAL CAROTID DUPLEX ULTRASOUND
TECHNIQUE: Gray scale imaging, color Doppler and duplex ultrasound were
performed of bilateral carotid and vertebral arteries in the neck.

[Series 1: us carotid bilateral · 13 of 67 slices shown]
[im 1/67]
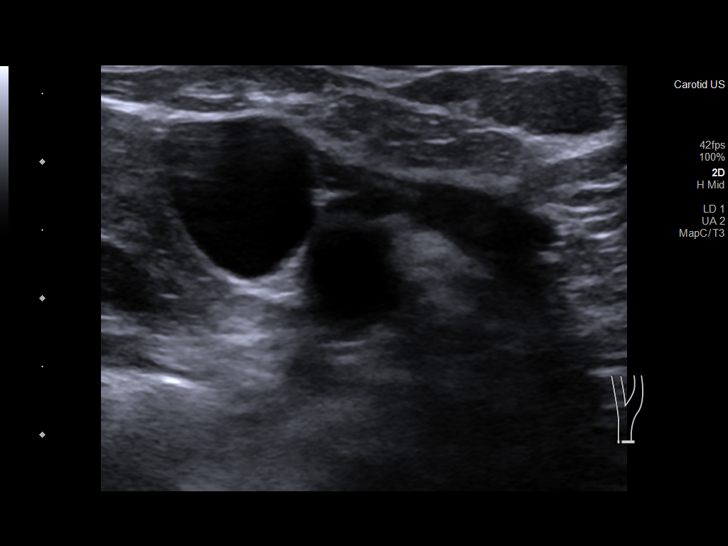
[im 6/67]
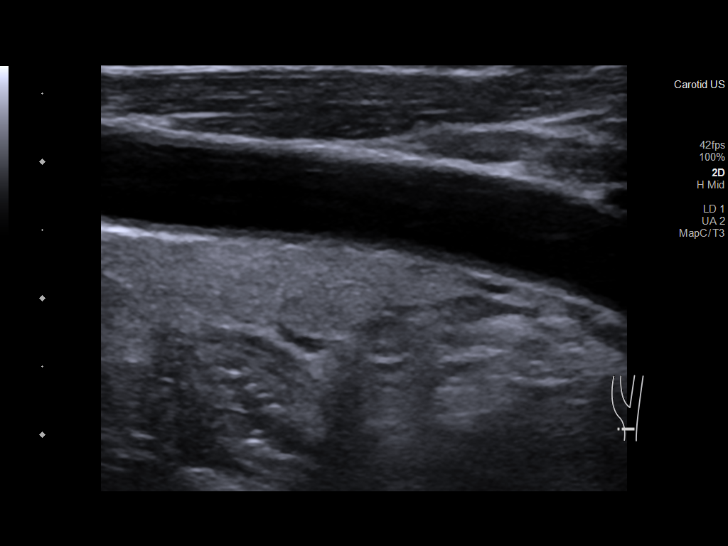
[im 12/67]
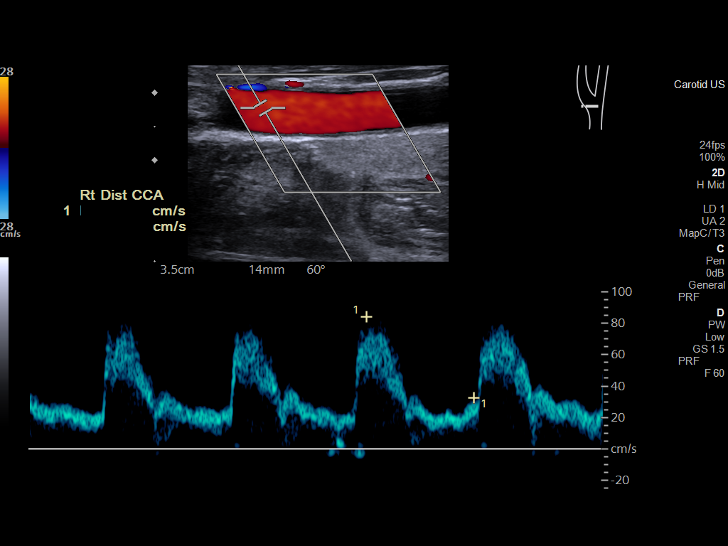
[im 18/67]
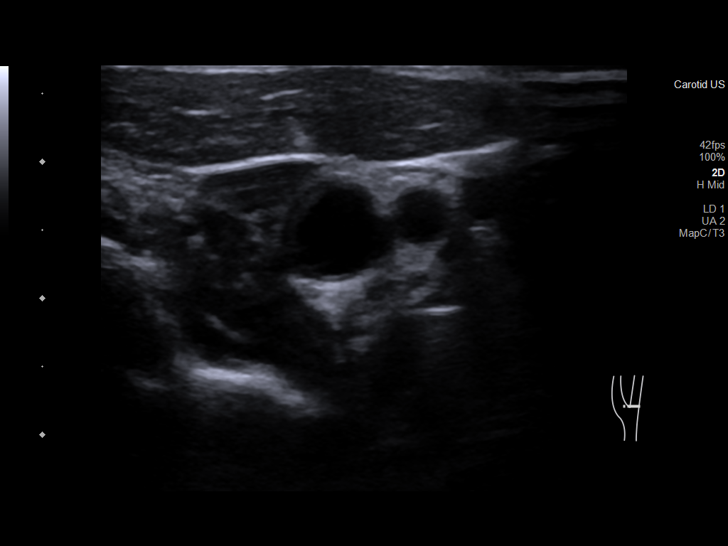
[im 23/67]
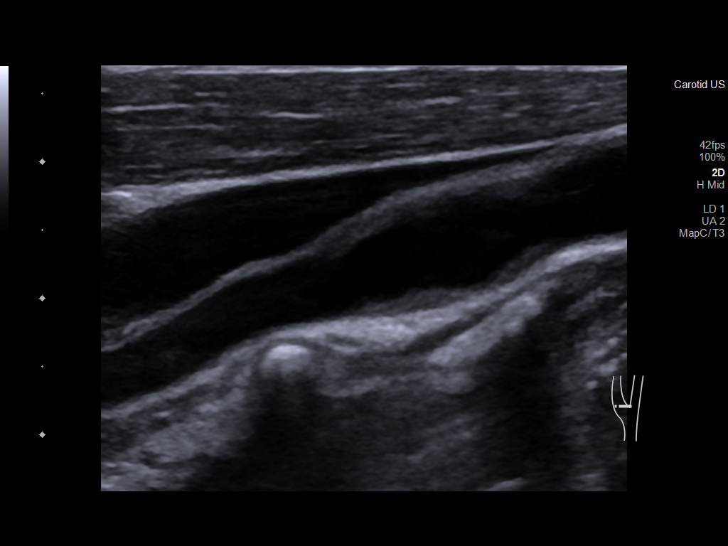
[im 29/67]
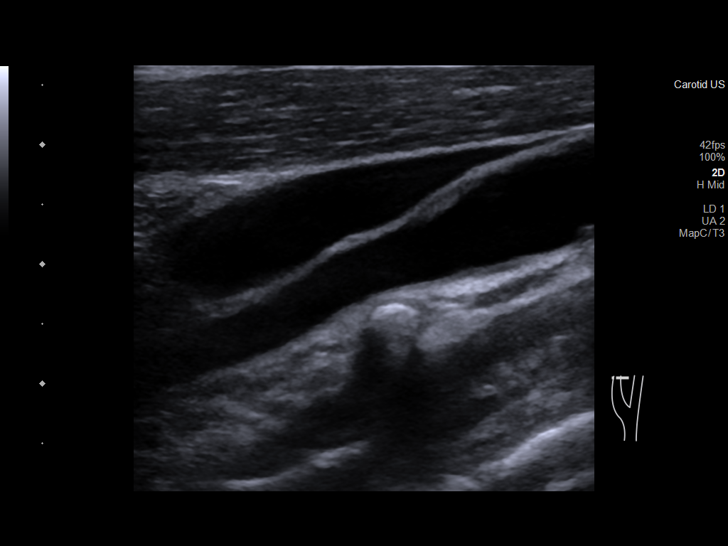
[im 35/67]
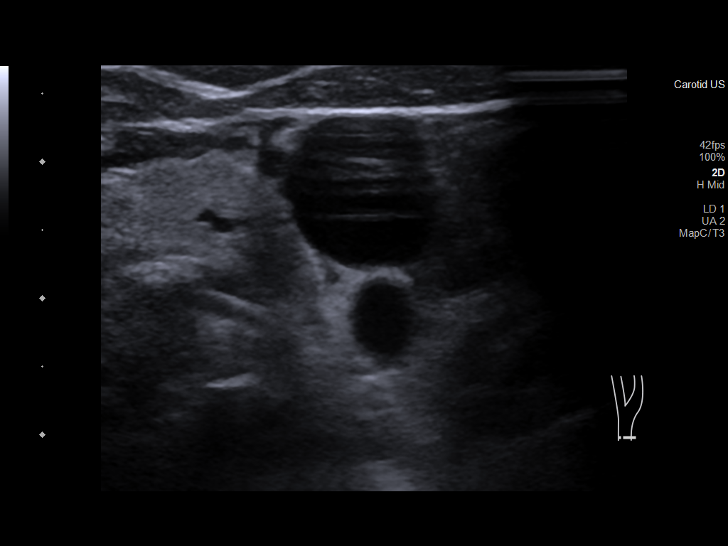
[im 38/67]
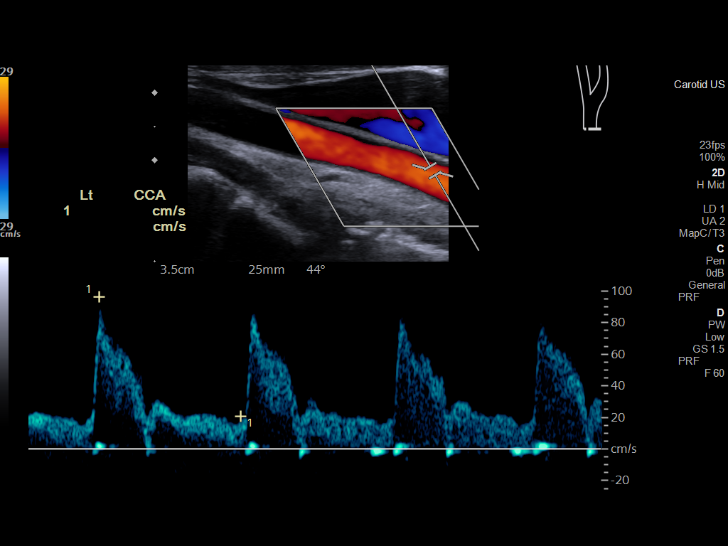
[im 44/67]
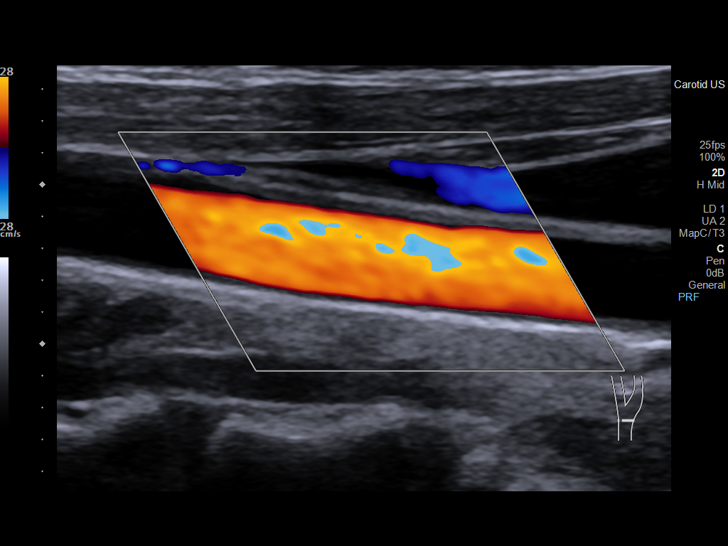
[im 49/67]
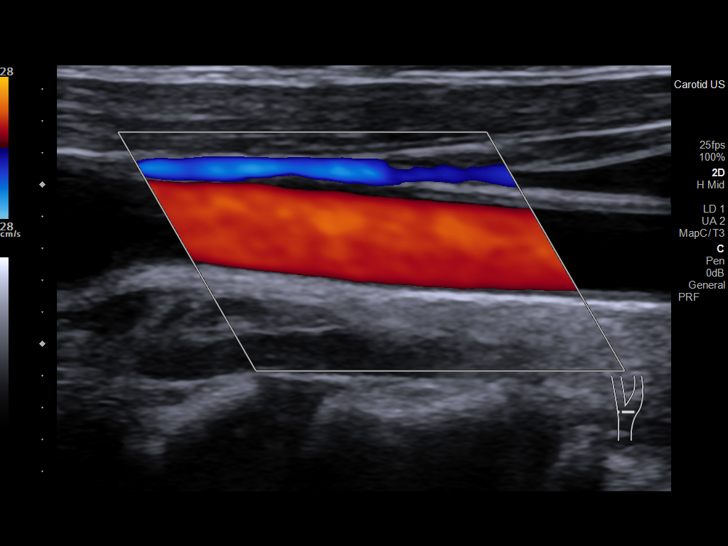
[im 55/67]
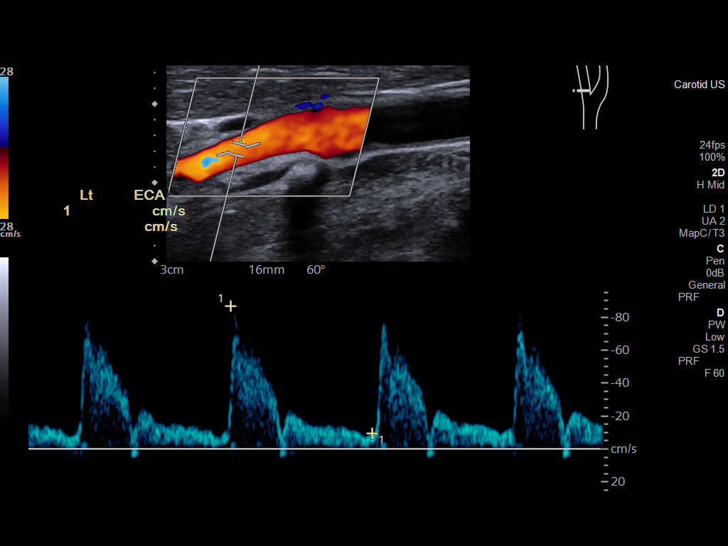
[im 61/67]
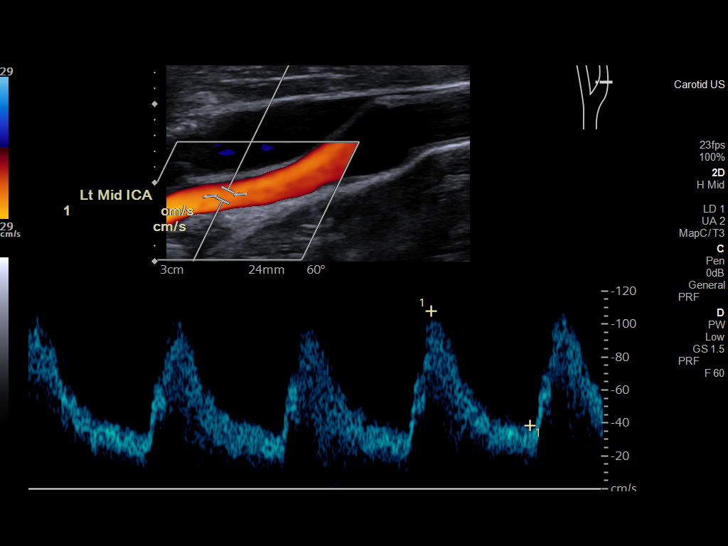
[im 67/67]
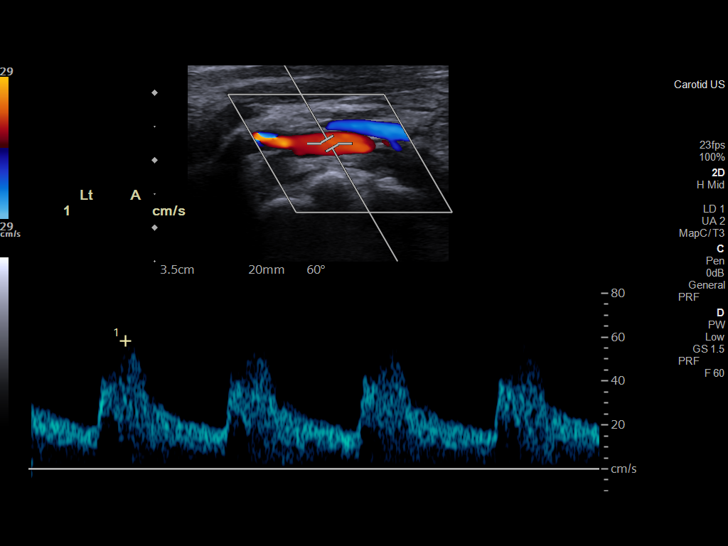

[13 of 24 positions shown; findings below may reference images not displayed]

FINDINGS: Criteria: Quantification of carotid stenosis is based on velocity
parameters that correlate the residual internal carotid diameter
with NASCET-based stenosis levels, using the diameter of the distal
internal carotid lumen as the denominator for stenosis measurement.

The following velocity measurements were obtained:

RIGHT

ICA: 98/40 cm/sec

CCA: 101/27 cm/sec

SYSTOLIC ICA/CCA RATIO:

ECA: 104 cm/sec

LEFT

ICA: 108/38 cm/sec

CCA: 98/27 cm/sec

SYSTOLIC ICA/CCA RATIO:

ECA: 87 cm/sec

RIGHT CAROTID ARTERY: Minor intimal thickening and very minimal
hypoechoic plaque formation. Despite this, no hemodynamically
significant right ICA stenosis, velocity elevation, turbulent flow.
Degree of narrowing estimated less than 50% by ultrasound criteria.

RIGHT VERTEBRAL ARTERY:  Normal antegrade flow

LEFT CAROTID ARTERY: Similar minor intimal thickening and minimal
hypoechoic plaque formation. No hemodynamically significant left ICA
stenosis, velocity elevation, turbulent flow. Degree of narrowing
also less than 50% by ultrasound criteria.

LEFT VERTEBRAL ARTERY:  Normal antegrade flow
IMPRESSION: Minor carotid atherosclerosis and intimal thickening. No
hemodynamically significant ICA stenosis. Degree of narrowing less
than 50% bilaterally by ultrasound criteria.

Patent normal antegrade vertebral flow bilaterally

## 2021-12-19 ENCOUNTER — Other Ambulatory Visit: Payer: Self-pay | Admitting: Dermatology

## 2021-12-29 ENCOUNTER — Encounter: Payer: Self-pay | Admitting: Family Medicine

## 2021-12-30 ENCOUNTER — Other Ambulatory Visit: Payer: Self-pay

## 2021-12-30 DIAGNOSIS — I7300 Raynaud's syndrome without gangrene: Secondary | ICD-10-CM

## 2021-12-30 DIAGNOSIS — Z8673 Personal history of transient ischemic attack (TIA), and cerebral infarction without residual deficits: Secondary | ICD-10-CM

## 2021-12-30 DIAGNOSIS — E7800 Pure hypercholesterolemia, unspecified: Secondary | ICD-10-CM

## 2021-12-30 DIAGNOSIS — K219 Gastro-esophageal reflux disease without esophagitis: Secondary | ICD-10-CM

## 2021-12-30 DIAGNOSIS — F330 Major depressive disorder, recurrent, mild: Secondary | ICD-10-CM

## 2021-12-30 DIAGNOSIS — E1140 Type 2 diabetes mellitus with diabetic neuropathy, unspecified: Secondary | ICD-10-CM

## 2021-12-30 DIAGNOSIS — E1121 Type 2 diabetes mellitus with diabetic nephropathy: Secondary | ICD-10-CM

## 2021-12-30 DIAGNOSIS — I10 Essential (primary) hypertension: Secondary | ICD-10-CM

## 2021-12-30 DIAGNOSIS — G629 Polyneuropathy, unspecified: Secondary | ICD-10-CM

## 2021-12-30 DIAGNOSIS — G43009 Migraine without aura, not intractable, without status migrainosus: Secondary | ICD-10-CM

## 2021-12-30 MED ORDER — FLUOXETINE HCL 10 MG PO CAPS
ORAL_CAPSULE | Freq: Every day | ORAL | 1 refills | Status: DC
  Filled 2021-12-30: qty 90, 90d supply, fill #0

## 2021-12-30 MED ORDER — OMEPRAZOLE 20 MG PO CPDR
DELAYED_RELEASE_CAPSULE | Freq: Two times a day (BID) | ORAL | 1 refills | Status: DC
Start: 2021-12-30 — End: 2022-07-22
  Filled 2021-12-30: qty 180, 90d supply, fill #0

## 2021-12-30 MED ORDER — GABAPENTIN 600 MG PO TABS
ORAL_TABLET | ORAL | 1 refills | Status: DC
  Filled 2021-12-30: qty 135, 90d supply, fill #0

## 2021-12-30 MED ORDER — ROSUVASTATIN CALCIUM 10 MG PO TABS
ORAL_TABLET | ORAL | 1 refills | Status: DC
  Filled 2021-12-30: qty 90, 90d supply, fill #0

## 2021-12-30 MED ORDER — ONDANSETRON 4 MG PO TBDP
ORAL_TABLET | Freq: Three times a day (TID) | ORAL | 0 refills | Status: DC | PRN
  Filled 2021-12-30: qty 20, 7d supply, fill #0

## 2021-12-30 MED ORDER — AMLODIPINE BESYLATE 10 MG PO TABS
ORAL_TABLET | Freq: Every day | ORAL | 1 refills | Status: DC
  Filled 2021-12-30: qty 90, 90d supply, fill #0

## 2021-12-30 MED ORDER — LOSARTAN POTASSIUM 100 MG PO TABS
ORAL_TABLET | Freq: Every day | ORAL | 1 refills | Status: DC
  Filled 2021-12-30: qty 90, 90d supply, fill #0

## 2021-12-31 ENCOUNTER — Other Ambulatory Visit: Payer: Self-pay

## 2022-01-09 ENCOUNTER — Other Ambulatory Visit: Payer: Self-pay | Admitting: Family Medicine

## 2022-01-09 ENCOUNTER — Other Ambulatory Visit: Payer: Self-pay

## 2022-01-09 ENCOUNTER — Encounter: Payer: Self-pay | Admitting: Family Medicine

## 2022-01-09 DIAGNOSIS — F5104 Psychophysiologic insomnia: Secondary | ICD-10-CM

## 2022-01-09 MED ORDER — HYDROXYZINE HCL 10 MG PO TABS
ORAL_TABLET | Freq: Three times a day (TID) | ORAL | 1 refills | Status: DC | PRN
  Filled 2022-01-09: qty 180, 60d supply, fill #0

## 2022-01-09 MED FILL — Zolpidem Tartrate Tab 10 MG: ORAL | 90 days supply | Qty: 90 | Fill #0 | Status: AC

## 2022-01-12 ENCOUNTER — Other Ambulatory Visit: Payer: Self-pay

## 2022-01-15 ENCOUNTER — Encounter: Payer: Self-pay | Admitting: Family Medicine

## 2022-01-15 ENCOUNTER — Other Ambulatory Visit: Payer: Self-pay

## 2022-01-15 DIAGNOSIS — E1121 Type 2 diabetes mellitus with diabetic nephropathy: Secondary | ICD-10-CM

## 2022-01-15 MED ORDER — SEMAGLUTIDE (1 MG/DOSE) 4 MG/3ML ~~LOC~~ SOPN
1.0000 mg | PEN_INJECTOR | SUBCUTANEOUS | 1 refills | Status: DC
  Filled 2022-01-15 – 2022-01-16 (×2): qty 9, 84d supply, fill #0

## 2022-01-16 ENCOUNTER — Other Ambulatory Visit: Payer: Self-pay

## 2022-03-10 ENCOUNTER — Encounter: Payer: Self-pay | Admitting: Family Medicine

## 2022-03-10 ENCOUNTER — Ambulatory Visit: Payer: No Typology Code available for payment source | Admitting: Family Medicine

## 2022-04-07 ENCOUNTER — Ambulatory Visit: Payer: No Typology Code available for payment source | Admitting: Dermatology

## 2022-04-12 ENCOUNTER — Observation Stay (HOSPITAL_COMMUNITY)
Admission: EM | Admit: 2022-04-12 | Discharge: 2022-04-13 | Disposition: A | Discharge status: Home / Self Care | Payer: Self-pay | Attending: Internal Medicine | Admitting: Internal Medicine

## 2022-04-12 ENCOUNTER — Other Ambulatory Visit: Payer: Self-pay

## 2022-04-12 ENCOUNTER — Encounter (HOSPITAL_COMMUNITY): Payer: Self-pay | Admitting: *Deleted

## 2022-04-12 ENCOUNTER — Emergency Department (HOSPITAL_COMMUNITY): Payer: Self-pay

## 2022-04-12 DIAGNOSIS — E119 Type 2 diabetes mellitus without complications: Secondary | ICD-10-CM | POA: Insufficient documentation

## 2022-04-12 DIAGNOSIS — E785 Hyperlipidemia, unspecified: Secondary | ICD-10-CM | POA: Diagnosis present

## 2022-04-12 DIAGNOSIS — R200 Anesthesia of skin: Principal | ICD-10-CM | POA: Diagnosis present

## 2022-04-12 DIAGNOSIS — R42 Dizziness and giddiness: Secondary | ICD-10-CM | POA: Insufficient documentation

## 2022-04-12 DIAGNOSIS — I10 Essential (primary) hypertension: Secondary | ICD-10-CM

## 2022-04-12 DIAGNOSIS — R202 Paresthesia of skin: Secondary | ICD-10-CM | POA: Diagnosis present

## 2022-04-12 DIAGNOSIS — Z79899 Other long term (current) drug therapy: Secondary | ICD-10-CM | POA: Insufficient documentation

## 2022-04-12 DIAGNOSIS — Z7982 Long term (current) use of aspirin: Secondary | ICD-10-CM | POA: Insufficient documentation

## 2022-04-12 DIAGNOSIS — I639 Cerebral infarction, unspecified: Secondary | ICD-10-CM | POA: Insufficient documentation

## 2022-04-12 LAB — CBC WITH DIFFERENTIAL/PLATELET
Abs Immature Granulocytes: 0.01 10*3/uL (ref 0.00–0.07)
Basophils Absolute: 0.1 10*3/uL (ref 0.0–0.1)
Basophils Relative: 1 %
Eosinophils Absolute: 0.1 10*3/uL (ref 0.0–0.5)
Eosinophils Relative: 1 %
HCT: 39.2 % (ref 36.0–46.0)
Hemoglobin: 12.3 g/dL (ref 12.0–15.0)
Immature Granulocytes: 0 %
Lymphocytes Relative: 17 %
Lymphs Abs: 1.7 10*3/uL (ref 0.7–4.0)
MCH: 26.1 pg (ref 26.0–34.0)
MCHC: 31.4 g/dL (ref 30.0–36.0)
MCV: 83.2 fL (ref 80.0–100.0)
Monocytes Absolute: 0.6 10*3/uL (ref 0.1–1.0)
Monocytes Relative: 6 %
Neutro Abs: 7.6 10*3/uL (ref 1.7–7.7)
Neutrophils Relative %: 75 %
Platelets: 390 10*3/uL (ref 150–400)
RBC: 4.71 MIL/uL (ref 3.87–5.11)
RDW: 14.4 % (ref 11.5–15.5)
WBC: 10.1 10*3/uL (ref 4.0–10.5)
nRBC: 0 % (ref 0.0–0.2)

## 2022-04-12 LAB — TROPONIN I (HIGH SENSITIVITY)
Troponin I (High Sensitivity): <2 ng/L (ref <18)
Troponin I (High Sensitivity): <2 ng/L (ref <18)

## 2022-04-12 LAB — COMPREHENSIVE METABOLIC PANEL
ALT: 18 U/L (ref 0–44)
AST: 17 U/L (ref 15–41)
Albumin: 4.4 g/dL (ref 3.5–5.0)
Alkaline Phosphatase: 88 U/L (ref 38–126)
Anion gap: 10 (ref 5–15)
BUN: 17 mg/dL (ref 6–20)
CO2: 24 mmol/L (ref 22–32)
Calcium: 9.1 mg/dL (ref 8.9–10.3)
Chloride: 105 mmol/L (ref 98–111)
Creatinine, Ser: 0.78 mg/dL (ref 0.44–1.00)
GFR, Estimated: >60 mL/min (ref >60)
Glucose, Bld: 92 mg/dL (ref 70–99)
Potassium: 3.8 mmol/L (ref 3.5–5.1)
Sodium: 139 mmol/L (ref 135–145)
Total Bilirubin: 0.5 mg/dL (ref 0.3–1.2)
Total Protein: 7.5 g/dL (ref 6.5–8.1)

## 2022-04-12 MED ORDER — SODIUM CHLORIDE 0.9 % IV BOLUS
1000.0000 mL | Freq: Once | INTRAVENOUS | Status: AC
Start: 2022-04-12 — End: 2022-04-12
  Administered 2022-04-12: 1000 mL via INTRAVENOUS

## 2022-04-12 NOTE — ED Provider Notes (Signed)
[facility] EMERGENCY DEPARTMENT Provider Note   CSN: [id number] Arrival date & time: 04/12/22  2015     History {Add pertinent medical, surgical, social history, OB history to HPI:1} Chief Complaint  Patient presents with   Dizziness    Shannon Soto is a 51 y.o. female.  Patient has a history of hypertension and a gastric sleeve.  She complains of numbness and weakness in her left arm and left leg since 11 PM Friday   Dizziness      Home Medications Prior to Admission medications   Medication Sig Start Date End Date Taking? Authorizing Provider  amLODipine (NORVASC) 10 MG tablet TAKE 1 TABLET BY MOUTH DAILY. 12/30/21 12/30/22  Steele Sizer, MD  aspirin EC 81 MG tablet  05/31/20   [provider]  Calcium Carb-Cholecalciferol (CALCIUM-VITAMIN D3) 500-400 MG-UNIT TABS Take 1 tablet by mouth 3 (three) times daily.    [provider]  cyanocobalamin 500 MCG tablet Take 1 mcg by mouth every morning.    [provider]  ferrous gluconate ([facility] IRON) 240 (27 FE) MG tablet Take 1 tablet (240 mg total) by mouth 3 (three) times daily with meals. 02/28/15   Steele Sizer, MD  Fluocinolone Acetonide Body 0.01 % OIL Apply 1 application topically to damp skin after shower daily 11/19/20   Brendolyn Patty, MD  FLUoxetine (PROZAC) 10 MG capsule TAKE 1 CAPSULE BY MOUTH DAILY. 12/30/21 12/30/22  Steele Sizer, MD  gabapentin (NEURONTIN) 600 MG tablet TAKE 1 & 1/2 TABLETS ('900MG'$ ) BY MOUTH AT BEDTIME 12/30/21 12/30/22  Steele Sizer, MD  hydrOXYzine (ATARAX) 10 MG tablet TAKE 1 TABLET BY MOUTH EVERY 8 HOURS AS NEEDED 01/09/22 01/09/23  Steele Sizer, MD  losartan (COZAAR) 100 MG tablet TAKE 1 TABLET (100 MG TOTAL) BY MOUTH DAILY. 12/30/21 12/30/22  Steele Sizer, MD  lubiprostone (AMITIZA) 8 MCG capsule Take 1 capsule (8 mcg total) by mouth 2 (two) times daily with a meal. 07/08/21   Steele Sizer, MD  Multiple Vitamins-Minerals (MULTIVITAMIN ADULTS PO) Take 1 tablet by  mouth daily. 08/22/19   [provider]  nitroGLYCERIN (NITROGLYN) 2 % ointment Apply 0.5 inches topically every 6 (six) hours. 04/16/20   Amin, Jeanella Flattery, MD  nystatin powder Apply topically 4 (four) times daily. 07/29/21   Steele Sizer, MD  omeprazole (PRILOSEC) 20 MG capsule TAKE 1 CAPSULE BY MOUTH TWICE DAILY 12/30/21 12/30/22  Sowles, Drue Stager, MD  ondansetron (ZOFRAN-ODT) 4 MG disintegrating tablet TAKE 1 TABLET BY MOUTH EVERY 8 HOURS AS NEEDED FOR NAUSEA OR VOMITING 12/30/21 12/30/22  Sowles, Drue Stager, MD  OPZELURA 1.5 % CREA APPLY ONE APPLICATION TOPICALLY DAILY. APPLY TO AFFECTED AREAS FOR ECZEMA AND VITILIGO 12/22/21   Brendolyn Patty, MD  Rimegepant Sulfate (NURTEC) 75 MG TBDP Take 1 tablet by mouth daily as needed. 10/16/21   Steele Sizer, MD  rosuvastatin (CRESTOR) 10 MG tablet TAKE 1 TABLET BY MOUTH DAILY IN PLACE OF PRAVASTATIN 12/30/21 12/30/22  Steele Sizer, MD  Semaglutide, 1 MG/DOSE, 4 MG/3ML SOPN Inject 1 mg as directed once a week. 01/15/22   Steele Sizer, MD  triamcinolone cream (KENALOG) 0.1 % Apply topically 2 (two) times daily. 07/29/21   Steele Sizer, MD  zolpidem (AMBIEN) 10 MG tablet TAKE 1 TABLET BY MOUTH AT BEDTIME. 01/09/22 07/08/22  Steele Sizer, MD      Allergies    Patient has no known allergies.    Review of Systems   Review of Systems  Neurological:  Positive for dizziness.  Physical Exam Updated Vital Signs BP (!) 107/58   Pulse 88   Temp 97.9 F (36.6 C) (Oral)   Resp 15   Ht '5\' 2"'$  (1.575 m)   Wt 60.3 kg   SpO2 100%   BMI 24.33 kg/m  Physical Exam  ED Results / Procedures / Treatments   Labs (all labs ordered are listed, but only abnormal results are displayed) Labs Reviewed  CBC WITH DIFFERENTIAL/PLATELET  COMPREHENSIVE METABOLIC PANEL  TROPONIN I (HIGH SENSITIVITY)  TROPONIN I (HIGH SENSITIVITY)    EKG EKG Interpretation  Date/Time:  Sunday April 12 2022 20:51:03 EDT Ventricular Rate:  78 PR Interval:  172 QRS  Duration: 84 QT Interval:  372 QTC Calculation: 424 R Axis:   91 Text Interpretation: Normal sinus rhythm Rightward axis Borderline ECG When compared with ECG of 15-Apr-2020 21:46, PREVIOUS ECG IS PRESENT Confirmed by Milton Ferguson [id number]) on 04/12/2022 10:31:05 PM  Radiology CT Head Wo Contrast  Result Date: 04/12/2022 CLINICAL DATA:  Altered mental status.  Cervical radiculopathy. EXAM: CT HEAD WITHOUT CONTRAST CT CERVICAL SPINE WITHOUT CONTRAST TECHNIQUE: Multidetector CT imaging of the head and cervical spine was performed following the standard protocol without intravenous contrast. Multiplanar CT image reconstructions of the cervical spine were also generated. RADIATION DOSE REDUCTION: This exam was performed according to the departmental dose-optimization program which includes automated exposure control, adjustment of the mA and/or kV according to patient size and/or use of iterative reconstruction technique. COMPARISON:  Head CT dated 04/15/2020 and MRI dated 04/16/2020. FINDINGS: CT HEAD FINDINGS Brain: The ventricles and sulci are appropriate size for the patient's age. The gray-white matter discrimination is preserved. There is no acute intracranial hemorrhage. No mass effect or midline shift. No extra-axial fluid collection. Vascular: No hyperdense vessel or unexpected calcification. Skull: Normal. Negative for fracture or focal lesion. Sinuses/Orbits: No acute finding. Other: None CT CERVICAL SPINE FINDINGS Alignment: No acute subluxation. Skull base and vertebrae: No acute fracture. Soft tissues and spinal canal: No prevertebral fluid or swelling. No visible canal hematoma. Disc levels: No acute findings. No significant degenerative changes. Upper chest: Negative. Other: None IMPRESSION: 1. No acute intracranial abnormality. 2. No acute cervical spine fracture or subluxation. Electronically Signed   By: Anner Crete M.D.   On: 04/12/2022 22:16   CT Cervical Spine Wo Contrast  Result  Date: 04/12/2022 CLINICAL DATA:  Altered mental status.  Cervical radiculopathy. EXAM: CT HEAD WITHOUT CONTRAST CT CERVICAL SPINE WITHOUT CONTRAST TECHNIQUE: Multidetector CT imaging of the head and cervical spine was performed following the standard protocol without intravenous contrast. Multiplanar CT image reconstructions of the cervical spine were also generated. RADIATION DOSE REDUCTION: This exam was performed according to the departmental dose-optimization program which includes automated exposure control, adjustment of the mA and/or kV according to patient size and/or use of iterative reconstruction technique. COMPARISON:  Head CT dated 04/15/2020 and MRI dated 04/16/2020. FINDINGS: CT HEAD FINDINGS Brain: The ventricles and sulci are appropriate size for the patient's age. The gray-white matter discrimination is preserved. There is no acute intracranial hemorrhage. No mass effect or midline shift. No extra-axial fluid collection. Vascular: No hyperdense vessel or unexpected calcification. Skull: Normal. Negative for fracture or focal lesion. Sinuses/Orbits: No acute finding. Other: None CT CERVICAL SPINE FINDINGS Alignment: No acute subluxation. Skull base and vertebrae: No acute fracture. Soft tissues and spinal canal: No prevertebral fluid or swelling. No visible canal hematoma. Disc levels: No acute findings. No significant degenerative changes. Upper chest: Negative. Other: None IMPRESSION: 1. No  acute intracranial abnormality. 2. No acute cervical spine fracture or subluxation. Electronically Signed   By: Anner Crete M.D.   On: 04/12/2022 22:16   DG Chest Port 1 View  Result Date: 04/12/2022 CLINICAL DATA:  Weakness. EXAM: PORTABLE CHEST 1 VIEW COMPARISON:  None Available. FINDINGS: The cardiomediastinal contours are normal. The lungs are clear. Pulmonary vasculature is normal. No consolidation, pleural effusion, or pneumothorax. No acute osseous abnormalities are seen. IMPRESSION: No acute  chest findings. Electronically Signed   By: Keith Rake M.D.   On: 04/12/2022 21:55    Procedures Procedures  {Document cardiac monitor, telemetry assessment procedure when appropriate:1}  Medications Ordered in ED Medications  sodium chloride 0.9 % bolus 1,000 mL (0 mLs Intravenous Stopped 04/12/22 2235)    ED Course/ Medical Decision Making/ A&P                           Medical Decision Making Amount and/or Complexity of Data Reviewed Labs: ordered. Radiology: ordered.  Risk Decision regarding hospitalization.   Patient with stroke symptoms.  She will be admitted to medicine  {Document critical care time when appropriate:1} {Document review of labs and clinical decision tools ie heart score, Chads2Vasc2 etc:1}  {Document your independent review of radiology images, and any outside records:1} {Document your discussion with family members, caretakers, and with consultants:1} {Document social determinants of health affecting pt's care:1} {Document your decision making why or why not admission, treatments were needed:1} Final Clinical Impression(s) / ED Diagnoses Final diagnoses:  Lightheadedness    Rx / DC Orders ED Discharge Orders     None

## 2022-04-12 NOTE — ED Notes (Signed)
To ct

## 2022-04-12 NOTE — ED Triage Notes (Signed)
Pt with left sided weakness, burning and tingling to left arm and left leg since Friday night. Pt's sister states pt had a tick bite removed about 3 weeks ago.  Not able to drink or eat for past 24 hours, took Zofran at 0200 this am without relief. +HA since this am. Dizziness since yesterday.

## 2022-04-13 ENCOUNTER — Other Ambulatory Visit (HOSPITAL_COMMUNITY): Payer: Self-pay | Admitting: *Deleted

## 2022-04-13 ENCOUNTER — Observation Stay (HOSPITAL_COMMUNITY): Payer: Self-pay

## 2022-04-13 ENCOUNTER — Observation Stay (HOSPITAL_BASED_OUTPATIENT_CLINIC_OR_DEPARTMENT_OTHER): Payer: Self-pay

## 2022-04-13 DIAGNOSIS — R200 Anesthesia of skin: Secondary | ICD-10-CM

## 2022-04-13 DIAGNOSIS — I10 Essential (primary) hypertension: Secondary | ICD-10-CM

## 2022-04-13 DIAGNOSIS — I639 Cerebral infarction, unspecified: Secondary | ICD-10-CM

## 2022-04-13 DIAGNOSIS — E782 Mixed hyperlipidemia: Secondary | ICD-10-CM

## 2022-04-13 DIAGNOSIS — R202 Paresthesia of skin: Secondary | ICD-10-CM

## 2022-04-13 LAB — ECHOCARDIOGRAM COMPLETE
AR max vel: 1.96 cm2
AV Area VTI: 2.11 cm2
AV Area mean vel: 1.92 cm2
AV Mean grad: 4 mmHg
AV Peak grad: 9.2 mmHg
Ao pk vel: 1.52 m/s
Area-P 1/2: 3.65 cm2
Height: 62 in
S' Lateral: 2.2 cm
Weight: 2169.33 oz

## 2022-04-13 LAB — CBC
HCT: 34.5 % — ABNORMAL LOW (ref 36.0–46.0)
Hemoglobin: 10.6 g/dL — ABNORMAL LOW (ref 12.0–15.0)
MCH: 26.1 pg (ref 26.0–34.0)
MCHC: 30.7 g/dL (ref 30.0–36.0)
MCV: 85 fL (ref 80.0–100.0)
Platelets: 342 10*3/uL (ref 150–400)
RBC: 4.06 MIL/uL (ref 3.87–5.11)
RDW: 14.4 % (ref 11.5–15.5)
WBC: 10 10*3/uL (ref 4.0–10.5)
nRBC: 0 % (ref 0.0–0.2)

## 2022-04-13 LAB — COMPREHENSIVE METABOLIC PANEL
ALT: 14 U/L (ref 0–44)
AST: 14 U/L — ABNORMAL LOW (ref 15–41)
Albumin: 3.6 g/dL (ref 3.5–5.0)
Alkaline Phosphatase: 69 U/L (ref 38–126)
Anion gap: 6 (ref 5–15)
BUN: 15 mg/dL (ref 6–20)
CO2: 23 mmol/L (ref 22–32)
Calcium: 8.5 mg/dL — ABNORMAL LOW (ref 8.9–10.3)
Chloride: 110 mmol/L (ref 98–111)
Creatinine, Ser: 0.58 mg/dL (ref 0.44–1.00)
GFR, Estimated: >60 mL/min (ref >60)
Glucose, Bld: 90 mg/dL (ref 70–99)
Potassium: 3.5 mmol/L (ref 3.5–5.1)
Sodium: 139 mmol/L (ref 135–145)
Total Bilirubin: 0.5 mg/dL (ref 0.3–1.2)
Total Protein: 6.2 g/dL — ABNORMAL LOW (ref 6.5–8.1)

## 2022-04-13 LAB — LIPID PANEL
Cholesterol: 103 mg/dL (ref 0–200)
HDL: 41 mg/dL (ref >40)
LDL Cholesterol: 58 mg/dL (ref 0–99)
Total CHOL/HDL Ratio: 2.5 RATIO
Triglycerides: 21 mg/dL (ref <150)
VLDL: 4 mg/dL (ref 0–40)

## 2022-04-13 LAB — VITAMIN B12: Vitamin B-12: 288 pg/mL (ref 180–914)

## 2022-04-13 LAB — HEMOGLOBIN A1C
Hgb A1c MFr Bld: 5.1 % (ref 4.8–5.6)
Mean Plasma Glucose: 99.67 mg/dL

## 2022-04-13 LAB — HIV ANTIBODY (ROUTINE TESTING W REFLEX): HIV Screen 4th Generation wRfx: NONREACTIVE

## 2022-04-13 MED ORDER — ZOLPIDEM TARTRATE 5 MG PO TABS
5.0000 mg | ORAL_TABLET | Freq: Every evening | ORAL | Status: DC | PRN
  Administered 2022-04-13: 5 mg via ORAL
  Filled 2022-04-13: qty 1

## 2022-04-13 MED ORDER — STROKE: EARLY STAGES OF RECOVERY BOOK: Freq: Once | Status: AC

## 2022-04-13 MED ORDER — AMLODIPINE BESYLATE 5 MG PO TABS
10.0000 mg | ORAL_TABLET | Freq: Every day | ORAL | Status: DC
  Filled 2022-04-13: qty 2

## 2022-04-13 MED ORDER — ROSUVASTATIN CALCIUM 10 MG PO TABS
10.0000 mg | ORAL_TABLET | Freq: Every day | ORAL | Status: DC
  Administered 2022-04-13: 10 mg via ORAL
  Filled 2022-04-13: qty 1

## 2022-04-13 MED ORDER — ACETAMINOPHEN 325 MG PO TABS
650.0000 mg | ORAL_TABLET | ORAL | Status: DC | PRN
  Administered 2022-04-13: 650 mg via ORAL
  Filled 2022-04-13: qty 2

## 2022-04-13 MED ORDER — ACETAMINOPHEN 650 MG RE SUPP: 650.0000 mg | RECTAL | Status: DC | PRN

## 2022-04-13 MED ORDER — ASPIRIN 81 MG PO TBEC
81.0000 mg | DELAYED_RELEASE_TABLET | Freq: Every day | ORAL | Status: DC
  Administered 2022-04-13: 81 mg via ORAL
  Filled 2022-04-13: qty 1

## 2022-04-13 MED ORDER — HEPARIN SODIUM (PORCINE) 5000 UNIT/ML IJ SOLN
5000.0000 [IU] | Freq: Three times a day (TID) | INTRAMUSCULAR | Status: DC
  Administered 2022-04-13: 5000 [IU] via SUBCUTANEOUS
  Filled 2022-04-13 (×2): qty 1

## 2022-04-13 MED ORDER — FLUOXETINE HCL 10 MG PO CAPS
10.0000 mg | ORAL_CAPSULE | Freq: Every day | ORAL | Status: DC
  Administered 2022-04-13: 10 mg via ORAL
  Filled 2022-04-13: qty 1

## 2022-04-13 MED ORDER — LOSARTAN POTASSIUM 50 MG PO TABS
100.0000 mg | ORAL_TABLET | Freq: Every day | ORAL | Status: DC
  Administered 2022-04-13: 100 mg via ORAL
  Filled 2022-04-13: qty 2

## 2022-04-13 MED ORDER — SENNOSIDES-DOCUSATE SODIUM 8.6-50 MG PO TABS: 1.0000 | ORAL_TABLET | Freq: Every evening | ORAL | Status: DC | PRN

## 2022-04-13 MED ORDER — GABAPENTIN 400 MG PO CAPS
800.0000 mg | ORAL_CAPSULE | Freq: Every day | ORAL | Status: DC
  Administered 2022-04-13: 800 mg via ORAL
  Filled 2022-04-13: qty 2

## 2022-04-13 MED ORDER — PANTOPRAZOLE SODIUM 40 MG PO TBEC
40.0000 mg | DELAYED_RELEASE_TABLET | Freq: Every day | ORAL | Status: DC
  Administered 2022-04-13: 40 mg via ORAL
  Filled 2022-04-13: qty 1

## 2022-04-13 MED ORDER — ACETAMINOPHEN 160 MG/5ML PO SOLN: 650.0000 mg | ORAL | Status: DC | PRN

## 2022-04-13 NOTE — Discharge Summary (Signed)
Physician Discharge Summary   Patient: Shannon Soto MRN: [id number] DOB: 1970-11-07  Admit date:     04/12/2022  Discharge date: 04/13/22  Discharge Physician: Flora Lipps   PCP: Steele Sizer, MD   Recommendations at discharge:   Follow-up with your primary care provider in 1 week. [facility] spotted fever and Lyme serology to follow. Possibility of neuropathy on the left upper extremity.  PCP to assess this in 1 week.  Discharge Diagnoses: Principal Problem:   Numbness and tingling Active Problems:   Essential hypertension   Hyperlipidemia  Resolved Problems:   * No resolved hospital problems. [facility] Course: No notes on file  Assessment and Plan: mild left arm weakness with numbness. Persistent for few days.  MRI of the brain negative for stroke.  CT scan without any acute abnormality.  CT C-spine no abnormality.   Check 2D echocardiogram with preserved LV function.  Possibility of worsening neuropathy.  Patient does have the peripheral lower extremity neuropathy which is pretty severe as per the patient.  Might benefit from discussion with PCP as outpatient and or consideration of MRI of the cervical spine/PT evaluation.  Follow Lyme and [facility] spotted fever serology.   History of tick bite in the popliteal fossa.  [facility] and Lyme serology has been sent.  No fever chills or rigor.  Reassess with PCP in 1 week.   Hyperlipidemia Continue Lipitor   Hypertension Continue losartan and Norvasc on discharge.   History of peripheral neuropathy.  On gabapentin at home states that this is pretty severe on the legs.  Continue gabapentin from home.  Might need adjustment in medication.  Consultants: None  Procedures performed: None  Disposition: Home Diet recommendation:  Discharge Diet Orders (From admission, onward)     Start     Ordered   04/13/22 0000  Diet - low sodium heart healthy        04/13/22 1623           Cardiac and Carb  modified diet DISCHARGE MEDICATION: Allergies as of 04/13/2022   No Known Allergies      Medication List     TAKE these medications    amLODipine 10 MG tablet Commonly known as: NORVASC TAKE 1 TABLET BY MOUTH DAILY. What changed: how much to take   aspirin EC 81 MG tablet Take 81 mg by mouth daily.   Calcium-Vitamin D3 500-400 MG-UNIT Tabs Take 1 tablet by mouth 3 (three) times daily.   cyanocobalamin 500 MCG tablet Commonly known as: VITAMIN B12 Take 500 mcg by mouth every 3 (three) days. Mon, wed, fri   ferrous gluconate 240 (27 FE) MG tablet Commonly known as: [facility] Iron Take 1 tablet (240 mg total) by mouth 3 (three) times daily with meals.   Fluocinolone Acetonide Body 0.01 % Oil Apply 1 application topically to damp skin after shower daily What changed:  how much to take how to take this when to take this   FLUoxetine 10 MG capsule Commonly known as: PROZAC TAKE 1 CAPSULE BY MOUTH DAILY. What changed: how much to take   gabapentin 600 MG tablet Commonly known as: NEURONTIN TAKE 1 & 1/2 TABLETS ('900MG'$ ) BY MOUTH AT BEDTIME What changed:  how much to take how to take this when to take this   hydrOXYzine 10 MG tablet Commonly known as: ATARAX TAKE 1 TABLET BY MOUTH EVERY 8 HOURS AS NEEDED What changed:  how much to take reasons to take this  losartan 100 MG tablet Commonly known as: COZAAR TAKE 1 TABLET (100 MG TOTAL) BY MOUTH DAILY. What changed: how much to take   MULTIVITAMIN ADULTS PO Take 1 tablet by mouth daily.   Nurtec 75 MG Tbdp Generic drug: Rimegepant Sulfate Take 1 tablet by mouth daily as needed. What changed: reasons to take this   omeprazole 20 MG capsule Commonly known as: PRILOSEC TAKE 1 CAPSULE BY MOUTH TWICE DAILY What changed: how much to take   ondansetron 4 MG disintegrating tablet Commonly known as: ZOFRAN-ODT DISSOLVE 1 TABLET BY MOUTH EVERY 8 HOURS AS NEEDED FOR NAUSEA OR VOMITING (TAKE 1 TABLET BY MOUTH EVERY 8  HOURS AS NEEDED FOR NAUSEA OR VOMITING) What changed:  how much to take reasons to take this   Opzelura 1.5 % Crea Generic drug: Ruxolitinib Phosphate APPLY ONE APPLICATION TOPICALLY DAILY. APPLY TO AFFECTED AREAS FOR ECZEMA AND VITILIGO What changed: See the new instructions.   Ozempic (1 MG/DOSE) 4 MG/3ML Sopn Generic drug: Semaglutide (1 MG/DOSE) Inject 1 mg into the skin as directed once a week. (Inject 1 mg as directed once a week.)   rosuvastatin 10 MG tablet Commonly known as: CRESTOR TAKE 1 TABLET BY MOUTH DAILY IN PLACE OF PRAVASTATIN What changed:  how much to take how to take this when to take this   zolpidem 10 MG tablet Commonly known as: AMBIEN TAKE 1 TABLET BY MOUTH AT BEDTIME. What changed: how much to take         Subjective Today, patient was seen and examined at bedside.  Complains of mild left upper extremity tingling and numbness.  Discharge Exam: Filed Weights   04/12/22 2044 04/13/22 0100  Weight: 60.3 kg 61.5 kg      04/13/2022    1:43 PM 04/13/2022    1:20 PM 04/13/2022   10:42 AM  Vitals with BMI  Systolic 91 976 734  Diastolic 56 74 65  Pulse 86 86 75    General:  Average built, not in obvious distress HENT:   No scleral pallor or icterus noted. Oral mucosa is moist.  Chest:  Clear breath sounds.  Diminished breath sounds bilaterally. No crackles or wheezes.  CVS: S1 &S2 heard. No murmur.  Regular rate and rhythm. Abdomen: Soft, nontender, nondistended.  Bowel sounds are heard.   Extremities: No cyanosis, clubbing or edema.  Peripheral pulses are palpable. Psych: Alert, awake and oriented, normal mood CNS:  No cranial nerve deficits.  Power equal in all extremities.   Skin: Warm and dry.  No rashes noted.   Condition at discharge: good  The results of significant diagnostics from this hospitalization (including imaging, microbiology, ancillary and laboratory) are listed below for reference.   Imaging Studies: ECHOCARDIOGRAM  COMPLETE  Result Date: 04/13/2022    ECHOCARDIOGRAM REPORT   Patient Name:   Shannon Soto Date of Exam: 04/13/2022 Medical Rec #:  [id number]         Height:       62.0 in Accession #:    [id number]        Weight:       135.6 lb Date of Birth:  06-30-1971          BSA:          1.621 m Patient Age:    1 years          BP:           122/70 mmHg Patient Gender: F  HR:           82 bpm. Exam Location:  [facility] Procedure: 2D Echo, Cardiac Doppler and Color Doppler Indications:    I63.9 Stroke  History:        Patient has prior history of Echocardiogram examinations, most                 recent 04/16/2020. TIA; Risk Factors:Diabetes, Dyslipidemia,                 Non-Smoker and Hypertension. Raynaud's phenomenon.  Sonographer:    Alvino Chapel RCS Referring Phys: [id number] ASIA B [place]  1. Left ventricular ejection fraction, by estimation, is 65 to 70%. The left ventricle has normal function. The left ventricle has no regional wall motion abnormalities. Left ventricular diastolic parameters were normal.  2. Right ventricular systolic function is normal. The right ventricular size is normal. There is normal pulmonary artery systolic pressure.  3. The mitral valve is normal in structure. No evidence of mitral valve regurgitation. No evidence of mitral stenosis.  4. The tricuspid valve is abnormal.  5. The aortic valve is tricuspid. Aortic valve regurgitation is not visualized. No aortic stenosis is present.  6. The inferior vena cava is normal in size with greater than 50% respiratory variability, suggesting right atrial pressure of 3 mmHg. FINDINGS  Left Ventricle: Left ventricular ejection fraction, by estimation, is 65 to 70%. The left ventricle has normal function. The left ventricle has no regional wall motion abnormalities. The left ventricular internal cavity size was normal in size. There is  no left ventricular hypertrophy. Left ventricular diastolic parameters were normal.  Right Ventricle: The right ventricular size is normal. Right vetricular wall thickness was not well visualized. Right ventricular systolic function is normal. There is normal pulmonary artery systolic pressure. The tricuspid regurgitant velocity is 2.56 m/s, and with an assumed right atrial pressure of 3 mmHg, the estimated right ventricular systolic pressure is 94.7 mmHg. Left Atrium: Left atrial size was normal in size. Right Atrium: Right atrial size was normal in size. Prominent Eustachian valve. Pericardium: There is no evidence of pericardial effusion. Mitral Valve: The mitral valve is normal in structure. No evidence of mitral valve regurgitation. No evidence of mitral valve stenosis. Tricuspid Valve: The tricuspid valve is abnormal. Tricuspid valve regurgitation is mild . No evidence of tricuspid stenosis. Aortic Valve: The aortic valve is tricuspid. Aortic valve regurgitation is not visualized. No aortic stenosis is present. Aortic valve mean gradient measures 4.0 mmHg. Aortic valve peak gradient measures 9.2 mmHg. Aortic valve area, by VTI measures 2.11 cm. Pulmonic Valve: The pulmonic valve was not well visualized. Pulmonic valve regurgitation is not visualized. No evidence of pulmonic stenosis. Aorta: The aortic root is normal in size and structure. Venous: The inferior vena cava is normal in size with greater than 50% respiratory variability, suggesting right atrial pressure of 3 mmHg. IAS/Shunts: No atrial level shunt detected by color flow Doppler.  LEFT VENTRICLE PLAX 2D LVIDd:         3.90 cm   Diastology LVIDs:         2.20 cm   LV e' medial:    10.70 cm/s LV PW:         0.90 cm   LV E/e' medial:  9.4 LV IVS:        0.80 cm   LV e' lateral:   13.80 cm/s LVOT diam:     1.70 cm   LV E/e' lateral: 7.3  LV SV:         61 LV SV Index:   37 LVOT Area:     2.27 cm  RIGHT VENTRICLE RV S prime:     13.40 cm/s TAPSE (M-mode): 2.2 cm LEFT ATRIUM             Index        RIGHT ATRIUM           Index LA diam:         3.30 cm 2.04 cm/m   RA Area:     12.10 cm LA Vol (A2C):   37.4 ml 23.08 ml/m  RA Volume:   25.10 ml  15.49 ml/m LA Vol (A4C):   48.8 ml 30.11 ml/m LA Biplane Vol: 43.8 ml 27.03 ml/m  AORTIC VALVE AV Area (Vmax):    1.96 cm AV Area (Vmean):   1.92 cm AV Area (VTI):     2.11 cm AV Vmax:           152.03 cm/s AV Vmean:          91.746 cm/s AV VTI:            0.288 m AV Peak Grad:      9.2 mmHg AV Mean Grad:      4.0 mmHg LVOT Vmax:         131.00 cm/s LVOT Vmean:        77.600 cm/s LVOT VTI:          0.267 m LVOT/AV VTI ratio: 0.93  AORTA Ao Root diam: 3.10 cm MITRAL VALVE                TRICUSPID VALVE MV Area (PHT): 3.65 cm     TR Peak grad:   26.2 mmHg MV Decel Time: 208 msec     TR Vmax:        256.00 cm/s MV E velocity: 101.00 cm/s MV A velocity: 76.70 cm/s   SHUNTS MV E/A ratio:  1.32         Systemic VTI:  0.27 m                             Systemic Diam: 1.70 cm Carlyle Dolly MD Electronically signed by Carlyle Dolly MD Signature Date/Time: 04/13/2022/1:48:27 PM    Final    MR BRAIN WO CONTRAST  Result Date: 04/13/2022 CLINICAL DATA:  Stroke, follow up.  Weakness and headache. EXAM: MRI HEAD WITHOUT CONTRAST TECHNIQUE: Multiplanar, multiecho pulse sequences of the brain and surrounding structures were obtained without intravenous contrast. COMPARISON:  Head CT 04/12/2022 and MRI 04/16/2020 FINDINGS: Brain: There is no evidence of an acute infarct, intracranial hemorrhage, mass, midline shift, or extra-axial fluid collection. The ventricles and sulci are normal. The brain is normal in signal. Vascular: Major intracranial vascular flow voids are preserved. Skull and upper cervical spine: Unremarkable bone marrow signal. Sinuses/Orbits: Bilateral cataract extraction. No significant inflammatory changes in the paranasal sinuses. Clear mastoid air cells. Other: None. IMPRESSION: Negative brain MRI. Electronically Signed   By: Logan Bores M.D.   On: 04/13/2022 12:04   CT Head Wo  Contrast  Result Date: 04/12/2022 CLINICAL DATA:  Altered mental status.  Cervical radiculopathy. EXAM: CT HEAD WITHOUT CONTRAST CT CERVICAL SPINE WITHOUT CONTRAST TECHNIQUE: Multidetector CT imaging of the head and cervical spine was performed following the standard protocol without intravenous contrast. Multiplanar CT image reconstructions of the cervical spine were also generated.  RADIATION DOSE REDUCTION: This exam was performed according to the departmental dose-optimization program which includes automated exposure control, adjustment of the mA and/or kV according to patient size and/or use of iterative reconstruction technique. COMPARISON:  Head CT dated 04/15/2020 and MRI dated 04/16/2020. FINDINGS: CT HEAD FINDINGS Brain: The ventricles and sulci are appropriate size for the patient's age. The gray-white matter discrimination is preserved. There is no acute intracranial hemorrhage. No mass effect or midline shift. No extra-axial fluid collection. Vascular: No hyperdense vessel or unexpected calcification. Skull: Normal. Negative for fracture or focal lesion. Sinuses/Orbits: No acute finding. Other: None CT CERVICAL SPINE FINDINGS Alignment: No acute subluxation. Skull base and vertebrae: No acute fracture. Soft tissues and spinal canal: No prevertebral fluid or swelling. No visible canal hematoma. Disc levels: No acute findings. No significant degenerative changes. Upper chest: Negative. Other: None IMPRESSION: 1. No acute intracranial abnormality. 2. No acute cervical spine fracture or subluxation. Electronically Signed   By: Anner Crete M.D.   On: 04/12/2022 22:16   CT Cervical Spine Wo Contrast  Result Date: 04/12/2022 CLINICAL DATA:  Altered mental status.  Cervical radiculopathy. EXAM: CT HEAD WITHOUT CONTRAST CT CERVICAL SPINE WITHOUT CONTRAST TECHNIQUE: Multidetector CT imaging of the head and cervical spine was performed following the standard protocol without intravenous contrast.  Multiplanar CT image reconstructions of the cervical spine were also generated. RADIATION DOSE REDUCTION: This exam was performed according to the departmental dose-optimization program which includes automated exposure control, adjustment of the mA and/or kV according to patient size and/or use of iterative reconstruction technique. COMPARISON:  Head CT dated 04/15/2020 and MRI dated 04/16/2020. FINDINGS: CT HEAD FINDINGS Brain: The ventricles and sulci are appropriate size for the patient's age. The gray-white matter discrimination is preserved. There is no acute intracranial hemorrhage. No mass effect or midline shift. No extra-axial fluid collection. Vascular: No hyperdense vessel or unexpected calcification. Skull: Normal. Negative for fracture or focal lesion. Sinuses/Orbits: No acute finding. Other: None CT CERVICAL SPINE FINDINGS Alignment: No acute subluxation. Skull base and vertebrae: No acute fracture. Soft tissues and spinal canal: No prevertebral fluid or swelling. No visible canal hematoma. Disc levels: No acute findings. No significant degenerative changes. Upper chest: Negative. Other: None IMPRESSION: 1. No acute intracranial abnormality. 2. No acute cervical spine fracture or subluxation. Electronically Signed   By: Anner Crete M.D.   On: 04/12/2022 22:16   DG Chest Port 1 View  Result Date: 04/12/2022 CLINICAL DATA:  Weakness. EXAM: PORTABLE CHEST 1 VIEW COMPARISON:  None Available. FINDINGS: The cardiomediastinal contours are normal. The lungs are clear. Pulmonary vasculature is normal. No consolidation, pleural effusion, or pneumothorax. No acute osseous abnormalities are seen. IMPRESSION: No acute chest findings. Electronically Signed   By: Keith Rake M.D.   On: 04/12/2022 21:55    Microbiology: Results for orders placed or performed during the hospital encounter of 04/15/20  SARS Coronavirus 2 by RT PCR (hospital order, performed in [facility] hospital lab) Nasopharyngeal  Nasopharyngeal Swab     Status: None   Collection Time: 04/15/20  9:31 PM   Specimen: Nasopharyngeal Swab  Result Value Ref Range Status   SARS Coronavirus 2 NEGATIVE NEGATIVE Final    Comment: (NOTE) SARS-CoV-2 target nucleic acids are NOT DETECTED.  The SARS-CoV-2 RNA is generally detectable in upper and lower respiratory specimens during the acute phase of infection. The lowest concentration of SARS-CoV-2 viral copies this assay can detect is 250 copies / mL. A negative result does not preclude SARS-CoV-2  infection and should not be used as the sole basis for treatment or other patient management decisions.  A negative result may occur with improper specimen collection / handling, submission of specimen other than nasopharyngeal swab, presence of viral mutation(s) within the areas targeted by this assay, and inadequate number of viral copies (<250 copies / mL). A negative result must be combined with clinical observations, patient history, and epidemiological information.  Fact Sheet for Patients:   StrictlyIdeas.no  Fact Sheet for Healthcare Providers: BankingDealers.co.za  This test is not yet approved or  cleared by the [place] FDA and has been authorized for detection and/or diagnosis of SARS-CoV-2 by FDA under an Emergency Use Authorization (EUA).  This EUA will remain in effect (meaning this test can be used) for the duration of the COVID-19 declaration under Section 564(b)(1) of the Act, 21 U.S.C. section 360bbb-3(b)(1), unless the authorization is terminated or revoked sooner.  Performed at [facility], [address], [place], [place] [address]     Labs: CBC: Recent Labs  Lab 04/12/22 2130 04/13/22 0453  WBC 10.1 10.0  NEUTROABS 7.6  --   HGB 12.3 10.6*  HCT 39.2 34.5*  MCV 83.2 85.0  PLT 390 295   Basic Metabolic Panel: Recent Labs  Lab 04/12/22 2130 04/13/22 0453  NA 139 139  K 3.8 3.5  CL 105  110  CO2 24 23  GLUCOSE 92 90  BUN 17 15  CREATININE 0.78 0.58  CALCIUM 9.1 8.5*   Liver Function Tests: Recent Labs  Lab 04/12/22 2130 04/13/22 0453  AST 17 14*  ALT 18 14  ALKPHOS 88 69  BILITOT 0.5 0.5  PROT 7.5 6.2*  ALBUMIN 4.4 3.6   CBG: No results for input(s): "GLUCAP" in the last 168 hours.  Discharge time spent: greater than 30 minutes.  Signed: Flora Lipps, MD [facility] 04/13/2022

## 2022-04-13 NOTE — Assessment & Plan Note (Signed)
Continue losartan and Norvasc

## 2022-04-13 NOTE — TOC Progression Note (Signed)
  Transition of Care [facility]) Screening Note   Patient Details  Name: Shannon Soto Date of Birth: 1970/09/01   Transition of Care [facility]) CM/SW Contact:    Shade Flood, LCSW Phone Number: 04/13/2022, 2:55 PM    Transition of Care Department [facility]) has reviewed patient and no TOC needs have been identified at this time. We will continue to monitor patient advancement through interdisciplinary progression rounds. If new patient transition needs arise, please place a TOC consult.

## 2022-04-13 NOTE — Assessment & Plan Note (Signed)
Continue Crestor 

## 2022-04-13 NOTE — Progress Notes (Signed)
*  PRELIMINARY RESULTS* Echocardiogram 2D Echocardiogram has been performed.  Shannon Soto 04/13/2022, 1:21 PM

## 2022-04-13 NOTE — Assessment & Plan Note (Signed)
Continue aspirin, Last known well 11 PM on August 18 Out of permissive hypertension window CT head shows no intracranial abnormality CT C-spine shows no acute cervical spine abnormality MRI in the a.m. Echo in the a.m. There is a stroke on the MRI, consider adding imaging of the vasculature Continue to monitor

## 2022-04-13 NOTE — Progress Notes (Signed)
OT Cancellation Note  Patient Details Name: Shannon Soto MRN: [id number] DOB: 1970-11-22   Cancelled Treatment:    Reason Eval/Treat Not Completed: OT screened, no needs identified, will sign off. MRI negative for acute changes, pt performing ADLs and mobility independently, at baseline. No further OT services required at this time.    Guadelupe Sabin, OTR/L  [phone] 04/13/2022, 4:40 PM

## 2022-04-13 NOTE — ED Notes (Signed)
Ambulatory without assist

## 2022-04-13 NOTE — Evaluation (Signed)
Physical Therapy Evaluation Patient Details Name: Shannon Soto MRN: [id number] DOB: 06-29-1971 Today's Date: 04/13/2022  History of Present Illness  KYNEDI PROFITT is a 51 y.o. female with medical history significant of diabetes mellitus without complication, GERD, hyperlipidemia, hypertension, history of obesity status post gastric sleeve, presents to the ED with a chief complaint of left-sided paresthesias.  Patient reports paresthesias in her left arm and leg.  Last known well was 11 PM on August 18.  She reports associated nausea and dizziness.  The nausea started on Saturday.  She has not eaten anything for more than 24 hours.  She reports the paresthesias were intermittent with no identified provocative or relieving factors.  She had associated diaphoresis on the bottom of her left foot.  She had minimal numbness as well.  She did not drop anything, and could walk without any falls.  She is concerned because she was bitten by a tick 3 to 4 weeks ago and by the time she identified it it was swollen with blood already.  It is unknown how long it was there.  It was in her popliteal fossa.  Patient reports that she had associated headache in her temples and behind her eyes bilaterally.  She is also had some left-sided neck tenderness.  The headache and tenderness started Saturday.  Patient has no other complaints at this time.  She has never had a stroke before.   Clinical Impression  Patient functioning near baseline for functional mobility and gait other than mild c/o paresthesia left side and demonstrates good return for ambulation in room and hallways without loss of balance.  Plan:  Patient discharged from physical therapy to care of nursing for ambulation daily as tolerated for length of stay.         Recommendations for follow up therapy are one component of a multi-disciplinary discharge planning process, led by the attending physician.  Recommendations may be updated based on patient  status, additional functional criteria and insurance authorization.  Follow Up Recommendations No PT follow up      Assistance Recommended at Discharge PRN  Patient can return home with the following  Other (comment) (near baseline)    Equipment Recommendations None recommended by PT  Recommendations for Other Services       Functional Status Assessment Patient has not had a recent decline in their functional status     Precautions / Restrictions Precautions Precautions: None Restrictions Weight Bearing Restrictions: No      Mobility  Bed Mobility Overal bed mobility: Independent                  Transfers Overall transfer level: Independent                      Ambulation/Gait Ambulation/Gait assistance: Independent, Modified independent (Device/Increase time) Gait Distance (Feet): 200 Feet Assistive device: None Gait Pattern/deviations: WFL(Within Functional Limits) Gait velocity: near normal     General Gait Details: grossly WFL with good retun for ambulation on level, inclined and declinded surfaces without loss of balance  Stairs            Wheelchair Mobility    Modified Rankin (Stroke Patients Only)       Balance Overall balance assessment: No apparent balance deficits (not formally assessed)  Pertinent Vitals/Pain Pain Assessment Pain Assessment: 0-10 Pain Score: 2  Pain Location: mild paresthesia left side Pain Descriptors / Indicators: Numbness Pain Intervention(s): Monitored during session    Home Living Family/patient expects to be discharged to:: Private residence Living Arrangements: Children Available Help at Discharge: Family;Available PRN/intermittently Type of Home: House Home Access: Stairs to enter Entrance Stairs-Rails: Right;Left;Can reach both Entrance Stairs-Number of Steps: 7   Home Layout: One level Home Equipment: None      Prior  Function Prior Level of Function : Independent/Modified Independent             Mobility Comments: Hydrographic surveyor, drives       Hand Dominance   Dominant Hand: Right    Extremity/Trunk Assessment   Upper Extremity Assessment Upper Extremity Assessment: Defer to OT evaluation    Lower Extremity Assessment Lower Extremity Assessment: Overall WFL for tasks assessed    Cervical / Trunk Assessment Cervical / Trunk Assessment: Normal  Communication   Communication: No difficulties  Cognition Arousal/Alertness: Awake/alert Behavior During Therapy: WFL for tasks assessed/performed Overall Cognitive Status: Within Functional Limits for tasks assessed                                          General Comments      Exercises     Assessment/Plan    PT Assessment Patient does not need any further PT services  PT Problem List         PT Treatment Interventions      PT Goals (Current goals can be found in the Care Plan section)  Acute Rehab PT Goals Patient Stated Goal: return home PT Goal Formulation: With patient Time For Goal Achievement: 04/13/22 Potential to Achieve Goals: Good    Frequency       Co-evaluation               AM-PAC PT "6 Clicks" Mobility  Outcome Measure Help needed turning from your back to your side while in a flat bed without using bedrails?: None Help needed moving from lying on your back to sitting on the side of a flat bed without using bedrails?: None Help needed moving to and from a bed to a chair (including a wheelchair)?: None Help needed standing up from a chair using your arms (e.g., wheelchair or bedside chair)?: None Help needed to walk in hospital room?: None Help needed climbing 3-5 steps with a railing? : None 6 Click Score: 24    End of Session   Activity Tolerance: Patient tolerated treatment well Patient left: in bed Nurse Communication: Mobility status PT Visit Diagnosis: Unsteadiness  on feet (R26.81);Other abnormalities of gait and mobility (R26.89);Muscle weakness (generalized) (M62.81)    Time: 8786-7672 PT Time Calculation (min) (ACUTE ONLY): 13 min   Charges:   PT Evaluation $PT Eval Low Complexity: 1 Low PT Treatments $Therapeutic Activity: 8-22 mins        2:17 PM, 04/13/22 Lonell Grandchild, MPT Physical Therapist with [facility] [phone] office [phone] mobile phone

## 2022-04-13 NOTE — Progress Notes (Signed)
SLP Cancellation Note  Patient Details Name: Shannon Soto MRN: [id number] DOB: 12-31-1970   Cancelled treatment:       Reason Eval/Treat Not Completed: SLP screened, no needs identified, will sign off; SLP screened Pt in room. Pt denies any changes in swallowing, speech, language, or cognition. MRI negative for acute changes. SLE will be deferred at this time. Reconsult if indicated. SLP will sign off.  Thank you,  Genene Churn, [place]  [place] 04/13/2022, 2:59 PM

## 2022-04-13 NOTE — Progress Notes (Addendum)
Same day note  Patient seen and examined at bedside.  Patient was admitted to the hospital for dizziness and nausea.  Today complains of mild headache and numbness.  Patient states she does have history of peripheral neuropathy and is on gabapentin  At the time of my evaluation, patient complains of left upper extremity tingling and numbness and weakness.  Physical examination reveals mild left upper extremity weakness.  Laboratory data and imaging was reviewed  Assessment and Plan.  Possible stroke, mild left arm weakness with numbness. CT scan without any acute abnormality.  CT C-spine no abnormality.  MRI of the brain pending.  Check 2D echocardiogram.  If MRI positive, continue vascular imaging.  If MRI negative likely to be neuropathy related.  We will continue to monitor for possible TIA.  Symptoms lasting for few days now.  History of tick bite in the popliteal fossa.  [facility] and [facility] serology has been sent.  Hyperlipidemia Continue Lipitor  Hypertension Continue losartan and Norvasc.  Allow permissive hypertension at this time  History of peripheral neuropathy.  On gabapentin at home states that this is pretty severe on the legs.  No Charge  Signed,  Delila Pereyra, MD [facility]

## 2022-04-13 NOTE — H&P (Signed)
History and Physical    Patient: Shannon Soto:[id number] DOB: 03-Oct-1970 DOA: 04/12/2022 DOS: the patient was seen and examined on 04/13/2022 PCP: Steele Sizer, MD  Patient coming from: Home  Chief Complaint:  Chief Complaint  Patient presents with   Dizziness   HPI: Shannon Soto is a 51 y.o. female with medical history significant of diabetes mellitus without complication, GERD, hyperlipidemia, hypertension, history of obesity status post gastric sleeve, presents to the ED with a chief complaint of left-sided paresthesias.  Patient reports paresthesias in her left arm and leg.  Last known well was 11 PM on August 18.  She reports associated nausea and dizziness.  The nausea started on Saturday.  She has not eaten anything for more than 24 hours.  She reports the paresthesias were intermittent with no identified provocative or relieving factors.  She had associated diaphoresis on the bottom of her left foot.  She had minimal numbness as well.  She did not drop anything, and could walk without any falls.  She is concerned because she was bitten by a tick 3 to 4 weeks ago and by the time she identified it it was swollen with blood already.  It is unknown how long it was there.  It was in her popliteal fossa.  Patient reports that she had associated headache in her temples and behind her eyes bilaterally.  She is also had some left-sided neck tenderness.  The headache and tenderness started Saturday.  Patient has no other complaints at this time.  She has never had a stroke before.  Patient does not smoke, does not drink, does not use illicit drugs.  She is vaccinated for COVID patient is full code.   Review of Systems: As mentioned in the history of present illness. All other systems reviewed and are negative. Past Medical History:  Diagnosis Date   Diabetes mellitus without complication ([place])    improved after gastric sleeve   GERD (gastroesophageal reflux disease)     Hyperlipidemia    Hypertension    Migraine headache    2x/mo   Orthodontics    braces   Raynaud's syndrome    Past Surgical History:  Procedure Laterality Date   ABDOMINAL HYSTERECTOMY     APPENDECTOMY     BREAST SURGERY     BREATH TEK H PYLORI N/A 08/27/2014   Procedure: BREATH TEK H PYLORI;  Surgeon: Pedro Earls, MD;  Location: [facility];  Service: General;  Laterality: N/A;   CATARACT EXTRACTION W/PHACO Left 09/20/2019   Procedure: CATARACT EXTRACTION PHACO AND INTRAOCULAR LENS PLACEMENT (IOC) LEFT 0.74 00:12.3 6.0%;  Surgeon: Leandrew Koyanagi, MD;  Location: [place];  Service: Ophthalmology;  Laterality: Left;   CATARACT EXTRACTION W/PHACO Right 10/11/2019   Procedure: CATARACT EXTRACTION PHACO AND INTRAOCULAR LENS PLACEMENT (IOC)  RIGHT 1.55 00:24.1 6.4%;  Surgeon: Leandrew Koyanagi, MD;  Location: [place];  Service: Ophthalmology;  Laterality: Right;  diabetic   COLONOSCOPY WITH PROPOFOL N/A 02/17/2019   Procedure: COLONOSCOPY WITH PROPOFOL;  Surgeon: Lucilla Lame, MD;  Location: [place];  Service: Endoscopy;  Laterality: N/A;   HIATAL HERNIA REPAIR  11/05/2014   Procedure: LAPAROSCOPIC REPAIR OF HIATAL HERNIA;  Surgeon: Johnathan Hausen, MD;  Location: [facility];  Service: General;;   LAPAROSCOPIC GASTRIC SLEEVE RESECTION N/A 11/05/2014   Procedure: LAPAROSCOPIC GASTRIC SLEEVE RESECTION;  Surgeon: Johnathan Hausen, MD;  Location: [facility];  Service: General;  Laterality: N/A;   POLYPECTOMY  02/17/2019   Procedure: POLYPECTOMY;  Surgeon: Lucilla Lame, MD;  Location: [place];  Service: Endoscopy;;   REDUCTION MAMMAPLASTY Bilateral 1997   UPPER GI ENDOSCOPY  11/05/2014   Procedure: UPPER GI ENDOSCOPY;  Surgeon: Johnathan Hausen, MD;  Location: [facility];  Service: General;;   Social History:  reports that she has never smoked. She has never used smokeless tobacco. She reports that she does not drink alcohol and does not use drugs.  No Known  Allergies  Family History  Problem Relation Age of Onset   Diabetes Paternal Uncle    Stroke Father    Hypertension Other    Breast cancer Maternal Grandmother 72    Prior to Admission medications   Medication Sig Start Date End Date Taking? Authorizing Provider  amLODipine (NORVASC) 10 MG tablet TAKE 1 TABLET BY MOUTH DAILY. 12/30/21 12/30/22  Steele Sizer, MD  aspirin EC 81 MG tablet  05/31/20   [provider]  Calcium Carb-Cholecalciferol (CALCIUM-VITAMIN D3) 500-400 MG-UNIT TABS Take 1 tablet by mouth 3 (three) times daily.    [provider]  cyanocobalamin 500 MCG tablet Take 1 mcg by mouth every morning.    [provider]  ferrous gluconate ([facility] IRON) 240 (27 FE) MG tablet Take 1 tablet (240 mg total) by mouth 3 (three) times daily with meals. 02/28/15   Steele Sizer, MD  Fluocinolone Acetonide Body 0.01 % OIL Apply 1 application topically to damp skin after shower daily 11/19/20   Brendolyn Patty, MD  FLUoxetine (PROZAC) 10 MG capsule TAKE 1 CAPSULE BY MOUTH DAILY. 12/30/21 12/30/22  Steele Sizer, MD  gabapentin (NEURONTIN) 600 MG tablet TAKE 1 & 1/2 TABLETS ('900MG'$ ) BY MOUTH AT BEDTIME 12/30/21 12/30/22  Steele Sizer, MD  hydrOXYzine (ATARAX) 10 MG tablet TAKE 1 TABLET BY MOUTH EVERY 8 HOURS AS NEEDED 01/09/22 01/09/23  Steele Sizer, MD  losartan (COZAAR) 100 MG tablet TAKE 1 TABLET (100 MG TOTAL) BY MOUTH DAILY. 12/30/21 12/30/22  Steele Sizer, MD  lubiprostone (AMITIZA) 8 MCG capsule Take 1 capsule (8 mcg total) by mouth 2 (two) times daily with a meal. 07/08/21   Steele Sizer, MD  Multiple Vitamins-Minerals (MULTIVITAMIN ADULTS PO) Take 1 tablet by mouth daily. 08/22/19   [provider]  nitroGLYCERIN (NITROGLYN) 2 % ointment Apply 0.5 inches topically every 6 (six) hours. 04/16/20   Amin, Jeanella Flattery, MD  nystatin powder Apply topically 4 (four) times daily. 07/29/21   Steele Sizer, MD  omeprazole (PRILOSEC) 20 MG capsule TAKE 1 CAPSULE BY  MOUTH TWICE DAILY 12/30/21 12/30/22  Sowles, Drue Stager, MD  ondansetron (ZOFRAN-ODT) 4 MG disintegrating tablet TAKE 1 TABLET BY MOUTH EVERY 8 HOURS AS NEEDED FOR NAUSEA OR VOMITING 12/30/21 12/30/22  Sowles, Drue Stager, MD  OPZELURA 1.5 % CREA APPLY ONE APPLICATION TOPICALLY DAILY. APPLY TO AFFECTED AREAS FOR ECZEMA AND VITILIGO 12/22/21   Brendolyn Patty, MD  Rimegepant Sulfate (NURTEC) 75 MG TBDP Take 1 tablet by mouth daily as needed. 10/16/21   Steele Sizer, MD  rosuvastatin (CRESTOR) 10 MG tablet TAKE 1 TABLET BY MOUTH DAILY IN PLACE OF PRAVASTATIN 12/30/21 12/30/22  Steele Sizer, MD  Semaglutide, 1 MG/DOSE, 4 MG/3ML SOPN Inject 1 mg as directed once a week. 01/15/22   Steele Sizer, MD  triamcinolone cream (KENALOG) 0.1 % Apply topically 2 (two) times daily. 07/29/21   Steele Sizer, MD  zolpidem (AMBIEN) 10 MG tablet TAKE 1 TABLET BY MOUTH AT BEDTIME. 01/09/22 07/08/22  Steele Sizer, MD    Physical Exam: Vitals:   04/13/22 0000 04/13/22  0030 04/13/22 0100 04/13/22 0315  BP: 106/64 115/67 105/66 103/68  Pulse: 84 76 65 85  Resp:  '16 20 18  '$ Temp:   98.7 F (37.1 C) (!) 97.2 F (36.2 C)  TempSrc:   Temporal   SpO2: 99% 100% 94% 100%  Weight:   61.5 kg   Height:       1.  General: Patient lying supine in bed, appearing younger than stated age, no acute distress   2. Psychiatric: Alert and oriented x 3, mood and behavior normal for situation, pleasant and cooperative with exam   3. Neurologic: Speech and language are normal, face is symmetric, 5 out of 5 strength in all extremities, however the left arm is subtly weaker than the right arm, patient reports hyposensitivity in her left lower extremity    4. HEENMT:  Head is atraumatic, normocephalic, pupils reactive to light, neck is supple, trachea is midline, mucous membranes are moist   5. Respiratory : Lungs are clear to auscultation bilaterally without wheezing, rhonchi, rales, no cyanosis, no increase in work of breathing or accessory  muscle use   6. Cardiovascular : Heart rate normal, rhythm is regular, no murmurs, rubs or gallops, no peripheral edema, peripheral pulses palpated   7. Gastrointestinal:  Abdomen is soft, nondistended, nontender to palpation bowel sounds active, no masses or organomegaly palpated   8. Skin:  Tick bite with some hyperpigmentation on the edges, hypopigmentation in the middle, and her right popliteal fossa   9.Musculoskeletal:  No acute deformities or trauma, no asymmetry in tone, no peripheral edema, peripheral pulses palpated, no tenderness to palpation in the extremities  Data Reviewed: The ED Temp 97.9, heart rate 77-89, respiratory rate 15-19, blood pressure 107/58-137/72, satting at 100% on room air No leukocytosis, hemoglobin stable, platelets 390 Chemistries unremarkable Troponin is undetectable x2 CT head and C-spine showed no acute changes Chest x-ray shows no acute findings EKG shows a heart rate of 78, normal sinus rhythm, QTc 424 Patient was able to ambulate in the ER without assistance She was given 1 L normal saline and admission was requested for stroke work-up  Assessment and Plan: * Stroke (cerebrum) (HCC) Continue aspirin, Last known well 11 PM on August 18 Out of permissive hypertension window CT head shows no intracranial abnormality CT C-spine shows no acute cervical spine abnormality MRI in the a.m. Echo in the a.m. There is a stroke on the MRI, consider adding imaging of the vasculature Continue to monitor  Hyperlipidemia Continue Crestor  Essential hypertension Continue losartan and Norvasc      Advance Care Planning:   Code Status: Full Code   Consults: Consider consulting neurology if there is a stroke on MRI  Family Communication: Mother at bedside  Severity of Illness: The appropriate patient status for this patient is OBSERVATION. Observation status is judged to be reasonable and necessary in order to provide the required intensity of  service to ensure the patient's safety. The patient's presenting symptoms, physical exam findings, and initial radiographic and laboratory data in the context of their medical condition is felt to place them at decreased risk for further clinical deterioration. Furthermore, it is anticipated that the patient will be medically stable for discharge from the hospital within 2 midnights of admission.   Author: Rolla Plate, DO 04/13/2022 4:24 AM  For on call review [email or web address].

## 2022-04-14 LAB — ROCKY MTN SPOTTED FVR ABS PNL(IGG+IGM)
RMSF IgG: NEGATIVE
RMSF IgM: 0.24 index (ref 0.00–0.89)

## 2022-04-14 LAB — LYME DISEASE SEROLOGY W/REFLEX: Lyme Total Antibody EIA: NEGATIVE

## 2022-04-16 ENCOUNTER — Other Ambulatory Visit: Payer: Self-pay

## 2022-04-20 NOTE — Progress Notes (Unsigned)
Name: Shannon Soto   MRN: [id number]    DOB: Feb 12, 1971   Date:04/21/2022       Progress Note  Subjective  [facility] Follow-Up  HPI  Admitted: 04/12/22 Discharged: 04/13/22  Patient developed left side tingling and numbness on 04/12/2022 , she currently does not have insurance but was too afraid not to go to [facility]. She had a previous episode in 2021 and was given a diagnosis of TIA. This time, CT neck, brain and MRI brain all within normal limits. Echo showed normal EF and only mild tricuspid regurgitation. She was found to have low B12 and was very anemic - but she asked me not to check iron studies at this time due to cost. Symptoms improved prior to discharge. She denies weakness but still has intermittent tingling. Advised her to increase B12 to one daily instead of three times a week, take iron tablets daily and follow up with neurologist ( she will contact them directly)  Insomnia: she needs a refill of Ambien. Only medication that helps with her sleep  DMII with peripheral neuropathy and nephropathy:  off metformin since bariatric surgery in 10/2014 , no polyphagia, but has episodes of polydipsia but no polyuria but still has nocturia  She takes  Gabapentin for neuropathy She is back on Losartan , because last urine micro was 100. A1C has been within normal for a long time , she has been tolerating Ozempic currently at 1 mg weekly , no side effects , she brought forms from [employer] to get the medication for free since she is currently unemployed     Bariatric Surgery: she had sleeve surgery done by Dr. Hassell Done on March 14th, 2016, Weight prior to surgery was 242 lbs She is doing well, hgbA1C started to go up, and we started her on Ozempic 02/2017 weight was in the 140 range until Fall 2020 when weight went up to 150 lbs  since Feb 2021 she lost down to 137 lbs by increasing her walks, weight was stable however when admitted to [facility] for possible TIA in August 2021  she lost  her appetite and weight dropped to 131 lbs. After that she started to gain weight again but she has been  back on Ozempic 1 mg and weight has been  stable at 140 lbs for a long time, today is down to 139 lbs. She has intermittent nausea since bariatric surgery and needs a refill of Zofran    Patient Active Problem List   Diagnosis Date Noted   Numbness and tingling 04/12/2022   Headache disorder 09/10/2020   TIA (transient ischemic attack) 05/06/2020   Numbness and tingling in left hand 04/16/2020   Encounter for screening colonoscopy    Polyp of sigmoid colon    Controlled type 2 diabetes mellitus with neuropathy ([place]) 05/12/2016   Raynaud's phenomenon 05/12/2016   Intertrigo 09/06/2015   Migraine headache without aura 02/28/2015   Essential hypertension 02/28/2015   History of obesity 02/28/2015   Hyperlipidemia 02/28/2015   GERD (gastroesophageal reflux disease) 02/28/2015   Vitamin D deficiency 02/28/2015   Chronic constipation 02/28/2015   Chronic insomnia 02/28/2015   Peripheral neuropathy 02/28/2015   Vitiligo 02/28/2015   Lower leg mass 02/28/2015   Depression with anxiety 02/28/2015   History of bariatric surgery 11/05/2014    Past Surgical History:  Procedure Laterality Date   ABDOMINAL HYSTERECTOMY     APPENDECTOMY     BREAST SURGERY     BREATH TEK H PYLORI  N/A 08/27/2014   Procedure: BREATH TEK H PYLORI;  Surgeon: Pedro Earls, MD;  Location: [facility];  Service: General;  Laterality: N/A;   CATARACT EXTRACTION W/PHACO Left 09/20/2019   Procedure: CATARACT EXTRACTION PHACO AND INTRAOCULAR LENS PLACEMENT (IOC) LEFT 0.74 00:12.3 6.0%;  Surgeon: Leandrew Koyanagi, MD;  Location: [place];  Service: Ophthalmology;  Laterality: Left;   CATARACT EXTRACTION W/PHACO Right 10/11/2019   Procedure: CATARACT EXTRACTION PHACO AND INTRAOCULAR LENS PLACEMENT (IOC)  RIGHT 1.55 00:24.1 6.4%;  Surgeon: Leandrew Koyanagi, MD;  Location: [place];   Service: Ophthalmology;  Laterality: Right;  diabetic   COLONOSCOPY WITH PROPOFOL N/A 02/17/2019   Procedure: COLONOSCOPY WITH PROPOFOL;  Surgeon: Lucilla Lame, MD;  Location: [place];  Service: Endoscopy;  Laterality: N/A;   HIATAL HERNIA REPAIR  11/05/2014   Procedure: LAPAROSCOPIC REPAIR OF HIATAL HERNIA;  Surgeon: Johnathan Hausen, MD;  Location: [facility];  Service: General;;   LAPAROSCOPIC GASTRIC SLEEVE RESECTION N/A 11/05/2014   Procedure: LAPAROSCOPIC GASTRIC SLEEVE RESECTION;  Surgeon: Johnathan Hausen, MD;  Location: [facility];  Service: General;  Laterality: N/A;   POLYPECTOMY  02/17/2019   Procedure: POLYPECTOMY;  Surgeon: Lucilla Lame, MD;  Location: [place];  Service: Endoscopy;;   REDUCTION MAMMAPLASTY Bilateral 1997   UPPER GI ENDOSCOPY  11/05/2014   Procedure: UPPER GI ENDOSCOPY;  Surgeon: Johnathan Hausen, MD;  Location: [facility];  Service: General;;    Family History  Problem Relation Age of Onset   Diabetes Paternal Uncle    Stroke Father    Hypertension Other    Breast cancer Maternal Grandmother 72    Social History   Tobacco Use   Smoking status: Never   Smokeless tobacco: Never  Substance Use Topics   Alcohol use: No    Alcohol/week: 0.0 standard drinks of alcohol    Comment: occasional      Current Outpatient Medications:    amLODipine (NORVASC) 10 MG tablet, TAKE 1 TABLET BY MOUTH DAILY., Disp: 90 tablet, Rfl: 1   aspirin EC 81 MG tablet, Take 81 mg by mouth daily., Disp: , Rfl:    Calcium Carb-Cholecalciferol (CALCIUM-VITAMIN D3) 500-400 MG-UNIT TABS, Take 1 tablet by mouth 3 (three) times daily., Disp: , Rfl:    cyanocobalamin 500 MCG tablet, Take 500 mcg by mouth every 3 (three) days. Mon, wed, fri, Disp: , Rfl:    ferrous gluconate ([employer] IRON) 240 (27 FE) MG tablet, Take 1 tablet (240 mg total) by mouth 3 (three) times daily with meals., Disp: 30 tablet, Rfl: 0   Fluocinolone Acetonide Body 0.01 % OIL, Apply 1 application topically to damp  skin after shower daily, Disp: 118.28 mL, Rfl: 3   FLUoxetine (PROZAC) 10 MG capsule, TAKE 1 CAPSULE BY MOUTH DAILY., Disp: 90 capsule, Rfl: 1   gabapentin (NEURONTIN) 600 MG tablet, TAKE 1 & 1/2 TABLETS ('900MG'$ ) BY MOUTH AT BEDTIME, Disp: 135 tablet, Rfl: 1   hydrOXYzine (ATARAX) 10 MG tablet, TAKE 1 TABLET BY MOUTH EVERY 8 HOURS AS NEEDED, Disp: 90 tablet, Rfl: 1   losartan (COZAAR) 100 MG tablet, TAKE 1 TABLET (100 MG TOTAL) BY MOUTH DAILY., Disp: 90 tablet, Rfl: 1   Multiple Vitamins-Minerals (MULTIVITAMIN ADULTS PO), Take 1 tablet by mouth daily., Disp: , Rfl:    omeprazole (PRILOSEC) 20 MG capsule, TAKE 1 CAPSULE BY MOUTH TWICE DAILY, Disp: 180 capsule, Rfl: 1   ondansetron (ZOFRAN-ODT) 4 MG disintegrating tablet, TAKE 1 TABLET BY MOUTH EVERY 8 HOURS AS NEEDED FOR  NAUSEA OR VOMITING, Disp: 20 tablet, Rfl: 0   OPZELURA 1.5 % CREA, APPLY ONE APPLICATION TOPICALLY DAILY. APPLY TO AFFECTED AREAS FOR ECZEMA AND VITILIGO, Disp: 60 g, Rfl: 3   Rimegepant Sulfate (NURTEC) 75 MG TBDP, Take 1 tablet by mouth daily as needed., Disp: 8 tablet, Rfl: 1   rosuvastatin (CRESTOR) 10 MG tablet, TAKE 1 TABLET BY MOUTH DAILY IN PLACE OF PRAVASTATIN, Disp: 90 tablet, Rfl: 1   Semaglutide, 1 MG/DOSE, 4 MG/3ML SOPN, Inject 1 mg as directed once a week., Disp: 9 mL, Rfl: 1   zolpidem (AMBIEN) 10 MG tablet, TAKE 1 TABLET BY MOUTH AT BEDTIME., Disp: 90 tablet, Rfl: 0  No Known Allergies  I personally reviewed active problem list, medication list, allergies, family history, social history, health maintenance with the patient/caregiver today.   ROS  Ten systems reviewed and is negative except as mentioned in HPI   Objective  Vitals:   04/21/22 1143  BP: 122/70  Pulse: 87  Resp: 16  SpO2: 99%  Weight: 139 lb (63 kg)  Height: '5\' 2"'$  (1.575 m)    Body mass index is 25.42 kg/m.  Physical Exam  Constitutional: Patient appears well-developed and well-nourished.  No distress.  HEENT: head atraumatic,  normocephalic, pupils equal and reactive to light, neck supple Cardiovascular: Normal rate, regular rhythm and normal heart sounds.  No murmur heard. No BLE edema. Pulmonary/Chest: Effort normal and breath sounds normal. No respiratory distress. Abdominal: Soft.  There is no tenderness. Neuro: normal exam today , no weakness, normal balance and gait  Psychiatric: Patient has a normal mood and affect. behavior is normal. Judgment and thought content normal.   Recent Results (from the past 2160 hour(s))  CBC with Differential     Status: None   Collection Time: 04/12/22  9:30 PM  Result Value Ref Range   WBC 10.1 4.0 - 10.5 K/uL   RBC 4.71 3.87 - 5.11 MIL/uL   Hemoglobin 12.3 12.0 - 15.0 g/dL   HCT 39.2 36.0 - 46.0 %   MCV 83.2 80.0 - 100.0 fL   MCH 26.1 26.0 - 34.0 pg   MCHC 31.4 30.0 - 36.0 g/dL   RDW 14.4 11.5 - 15.5 %   Platelets 390 150 - 400 K/uL   nRBC 0.0 0.0 - 0.2 %   Neutrophils Relative % 75 %   Neutro Abs 7.6 1.7 - 7.7 K/uL   Lymphocytes Relative 17 %   Lymphs Abs 1.7 0.7 - 4.0 K/uL   Monocytes Relative 6 %   Monocytes Absolute 0.6 0.1 - 1.0 K/uL   Eosinophils Relative 1 %   Eosinophils Absolute 0.1 0.0 - 0.5 K/uL   Basophils Relative 1 %   Basophils Absolute 0.1 0.0 - 0.1 K/uL   Immature Granulocytes 0 %   Abs Immature Granulocytes 0.01 0.00 - 0.07 K/uL    Comment: Performed at [facility], [address] [place], [place] [address]  Comprehensive metabolic panel     Status: None   Collection Time: 04/12/22  9:30 PM  Result Value Ref Range   Sodium 139 135 - 145 mmol/L   Potassium 3.8 3.5 - 5.1 mmol/L   Chloride 105 98 - 111 mmol/L   CO2 24 22 - 32 mmol/L   Glucose, Bld 92 70 - 99 mg/dL    Comment: Glucose reference range applies only to samples taken after fasting for at least 8 hours.   BUN 17 6 - 20 mg/dL   Creatinine, Ser 0.78 0.44 -  1.00 mg/dL   Calcium 9.1 8.9 - 10.3 mg/dL   Total Protein 7.5 6.5 - 8.1 g/dL   Albumin 4.4 3.5 - 5.0 g/dL   AST 17 15 - 41  U/L   ALT 18 0 - 44 U/L   Alkaline Phosphatase 88 38 - 126 U/L   Total Bilirubin 0.5 0.3 - 1.2 mg/dL   GFR, Estimated >60 >60 mL/min    Comment: (NOTE) Calculated using the CKD-EPI Creatinine Equation (2021)    Anion gap 10 5 - 15    Comment: Performed at [facility], [address], [place], [place] [address]  Troponin I (High Sensitivity)     Status: None   Collection Time: 04/12/22  9:30 PM  Result Value Ref Range   Troponin I (High Sensitivity) <2 <18 ng/L    Comment: (NOTE) Elevated high sensitivity troponin I (hsTnI) values and significant  changes across serial measurements may suggest ACS but many other  chronic and acute conditions are known to elevate hsTnI results.  Refer to the "Links" section for chest pain algorithms and additional  guidance. Performed at [facility], [address], [place], [place] [address]   Troponin I (High Sensitivity)     Status: None   Collection Time: 04/12/22 11:07 PM  Result Value Ref Range   Troponin I (High Sensitivity) <2 <18 ng/L    Comment: (NOTE) Elevated high sensitivity troponin I (hsTnI) values and significant  changes across serial measurements may suggest ACS but many other  chronic and acute conditions are known to elevate hsTnI results.  Refer to the "Links" section for chest pain algorithms and additional  guidance. Performed at [facility], [address], [place], [place] [address]   HIV Antibody (routine testing w rflx)     Status: None   Collection Time: 04/13/22  4:53 AM  Result Value Ref Range   HIV Screen 4th Generation wRfx Non Reactive Non Reactive    Comment: Performed at [facility], [place] [address], [place], [place] [address]  Hemoglobin A1c     Status: None   Collection Time: 04/13/22  4:53 AM  Result Value Ref Range   Hgb A1c MFr Bld 5.1 4.8 - 5.6 %    Comment: (NOTE) Pre diabetes:          5.7%-6.4%  Diabetes:              >6.4%  Glycemic control for   <7.0% adults with diabetes    Mean  Plasma Glucose 99.67 mg/dL    Comment: Performed at [facility] [address] [place], [place] [address]  CBC     Status: Abnormal   Collection Time: 04/13/22  4:53 AM  Result Value Ref Range   WBC 10.0 4.0 - 10.5 K/uL   RBC 4.06 3.87 - 5.11 MIL/uL   Hemoglobin 10.6 (L) 12.0 - 15.0 g/dL   HCT 34.5 (L) 36.0 - 46.0 %   MCV 85.0 80.0 - 100.0 fL   MCH 26.1 26.0 - 34.0 pg   MCHC 30.7 30.0 - 36.0 g/dL   RDW 14.4 11.5 - 15.5 %   Platelets 342 150 - 400 K/uL   nRBC 0.0 0.0 - 0.2 %    Comment: Performed at [facility], [address] [place], [place] [address]  Comprehensive metabolic panel     Status: Abnormal   Collection Time: 04/13/22  4:53 AM  Result Value Ref Range   Sodium 139 135 - 145 mmol/L  Potassium 3.5 3.5 - 5.1 mmol/L   Chloride 110 98 - 111 mmol/L   CO2 23 22 - 32 mmol/L   Glucose, Bld 90 70 - 99 mg/dL    Comment: Glucose reference range applies only to samples taken after fasting for at least 8 hours.   BUN 15 6 - 20 mg/dL   Creatinine, Ser 0.58 0.44 - 1.00 mg/dL   Calcium 8.5 (L) 8.9 - 10.3 mg/dL   Total Protein 6.2 (L) 6.5 - 8.1 g/dL   Albumin 3.6 3.5 - 5.0 g/dL   AST 14 (L) 15 - 41 U/L   ALT 14 0 - 44 U/L   Alkaline Phosphatase 69 38 - 126 U/L   Total Bilirubin 0.5 0.3 - 1.2 mg/dL   GFR, Estimated >60 >60 mL/min    Comment: (NOTE) Calculated using the CKD-EPI Creatinine Equation (2021)    Anion gap 6 5 - 15    Comment: Performed at [facility], [address] [place], [place] [address]  Rocky mtn spotted fvr abs pnl(IgG+IgM)     Status: None   Collection Time: 04/13/22  4:53 AM  Result Value Ref Range   RMSF IgG Negative Negative   RMSF IgM 0.24 0.00 - 0.89 index    Comment: (NOTE)                                 Negative        <0.90                                 Equivocal 0.90 - 1.10                                 Positive        >1.10 Performed At: [facility] [place], [place] [id number] Rush Farmer MD  MG:[id number]   Lyme Disease Serology w/Reflex     Status: None   Collection Time: 04/13/22  4:53 AM  Result Value Ref Range   Lyme Total Antibody EIA Negative Negative    Comment: (NOTE) Lyme antibodies not detected. Reflex testing is not indicated. No laboratory evidence of infection with B. burgdorferi (Lyme disease). Negative results may occur in patients recently infected (less than or equal to 14 days) with B. burgdorferi.  If recent infection is suspected, repeat testing on a new sample collected in 7 to 14 days is recommended. Performed At: [facility] [place], [place] [id number] Rush Farmer MD DX:[id number]   Vitamin B12     Status: None   Collection Time: 04/13/22  4:53 AM  Result Value Ref Range   Vitamin B-12 288 180 - 914 pg/mL    Comment: (NOTE) This assay is not validated for testing neonatal or myeloproliferative syndrome specimens for Vitamin B12 levels. Performed at [facility], [address], [place],  [address]   Lipid panel     Status: None   Collection Time: 04/13/22  4:54 AM  Result Value Ref Range   Cholesterol 103 0 - 200 mg/dL   Triglycerides 21 <150 mg/dL   HDL 41 >40 mg/dL   Total CHOL/HDL Ratio 2.5 RATIO   VLDL 4 0 - 40 mg/dL   LDL Cholesterol 58 0 - 99 mg/dL    Comment:  Total Cholesterol/HDL:CHD Risk Coronary Heart Disease Risk Table                     Men   Women  1/2 Average Risk   3.4   3.3  Average Risk       5.0   4.4  2 X Average Risk   9.6   7.1  3 X Average Risk  23.4   11.0        Use the calculated Patient Ratio above and the CHD Risk Table to determine the patient's CHD Risk.        ATP III CLASSIFICATION (LDL):  <100     mg/dL   Optimal  100-129  mg/dL   Near or Above                    Optimal  130-159  mg/dL   Borderline  160-189  mg/dL   High  >190     mg/dL   Very High Performed at [facility], [address] [place], [place] [address]   ECHOCARDIOGRAM COMPLETE      Status: None   Collection Time: 04/13/22  1:20 PM  Result Value Ref Range   Weight 2,169.33 oz   Height 62 in   BP 110/65 mmHg   Area-P 1/2 3.65 cm2   S' Lateral 2.20 cm   AR max vel 1.96 cm2   AV Area mean vel 1.92 cm2   AV Area VTI 2.11 cm2   AV Peak grad 9.2 mmHg   Ao pk vel 1.52 m/s   AV Mean grad 4.0 mmHg    PHQ2/9:    04/21/2022   11:42 AM 10/28/2021    1:08 PM 07/08/2021    1:10 PM 03/11/2021    1:12 PM 10/22/2020    1:17 PM  Depression screen PHQ 2/9  Decreased Interest 0 0 0 1 0  Down, Depressed, Hopeless 0 0 0 0 0  PHQ - 2 Score 0 0 0 1 0  Altered sleeping 3 0 0 0 0  Tired, decreased energy 3 0 0 1 3  Change in appetite 0 0 0 0 0  Feeling bad or failure about yourself  0 0 0 0 0  Trouble concentrating 0 0 0 0 0  Moving slowly or fidgety/restless 0 0 0 0 0  Suicidal thoughts 0 0 0 0 0  PHQ-9 Score 6 0 0 2 3    phq 9 is negative   Fall Risk:    04/21/2022   11:42 AM 10/28/2021    1:08 PM 07/08/2021    1:10 PM 03/11/2021    1:11 PM 10/22/2020    1:17 PM  [place] in the past year? 0 1 0 0 0  Number falls in past yr: 0 0 0 0 0  Injury with Fall? 0 0 0 0 0  Risk for fall due to : No Fall Risks No Fall Risks No Fall Risks    Follow up Falls prevention discussed Falls prevention discussed Falls prevention discussed Falls prevention discussed       Functional Status Survey: Is the patient deaf or have difficulty hearing?: No Does the patient have difficulty seeing, even when wearing glasses/contacts?: No Does the patient have difficulty concentrating, remembering, or making decisions?: No Does the patient have difficulty walking or climbing stairs?: No Does the patient have difficulty dressing or bathing?: No Does the patient have difficulty doing errands alone such  as visiting a doctor's office or shopping?: No    Assessment & Plan  1. Hospital discharge follow-up   2. Mild tricuspid valve regurgitation  Reviewed with patient, explained not  the cause of paresthesia   3. Mild atherosclerosis of both carotid arteries  On doppler done in 2021 , not repeated during this hospital stay   4. Paresthesia of left upper and lower extremity  She will contact neurologist   5. Type 2 diabetes with nephropathy (HCC)  - Semaglutide, 1 MG/DOSE, 4 MG/3ML SOPN; Inject 1 mg as directed once a week.  Dispense: 9 mL; Refill: 3  6. Anemia, unspecified type  She refused to have iron studies due to cost   7. B12 deficiency  Resume taking SL medication daily instead of three times a week  8. Chronic insomnia  - zolpidem (AMBIEN) 10 MG tablet; TAKE 1 TABLET BY MOUTH AT BEDTIME.  Dispense: 90 tablet; Refill: 0  9. Migraine without aura and without status migrainosus, not intractable  - ondansetron (ZOFRAN-ODT) 4 MG disintegrating tablet; TAKE 1 TABLET BY MOUTH EVERY 8 HOURS AS NEEDED FOR NAUSEA OR VOMITING  Dispense: 20 tablet; Refill: 0

## 2022-04-21 ENCOUNTER — Encounter: Payer: Self-pay | Admitting: Family Medicine

## 2022-04-21 ENCOUNTER — Ambulatory Visit: Payer: Self-pay | Admitting: Family Medicine

## 2022-04-21 VITALS — BP 122/70 | HR 87 | Resp 16 | Ht 62.0 in | Wt 139.0 lb

## 2022-04-21 DIAGNOSIS — D649 Anemia, unspecified: Secondary | ICD-10-CM

## 2022-04-21 DIAGNOSIS — G43009 Migraine without aura, not intractable, without status migrainosus: Secondary | ICD-10-CM

## 2022-04-21 DIAGNOSIS — I6523 Occlusion and stenosis of bilateral carotid arteries: Secondary | ICD-10-CM

## 2022-04-21 DIAGNOSIS — Z09 Encounter for follow-up examination after completed treatment for conditions other than malignant neoplasm: Secondary | ICD-10-CM

## 2022-04-21 DIAGNOSIS — E538 Deficiency of other specified B group vitamins: Secondary | ICD-10-CM

## 2022-04-21 DIAGNOSIS — F5104 Psychophysiologic insomnia: Secondary | ICD-10-CM

## 2022-04-21 DIAGNOSIS — E1121 Type 2 diabetes mellitus with diabetic nephropathy: Secondary | ICD-10-CM

## 2022-04-21 DIAGNOSIS — I071 Rheumatic tricuspid insufficiency: Secondary | ICD-10-CM

## 2022-04-21 DIAGNOSIS — R202 Paresthesia of skin: Secondary | ICD-10-CM

## 2022-04-21 MED ORDER — ZOLPIDEM TARTRATE 10 MG PO TABS: ORAL_TABLET | Freq: Every day | ORAL | 0 refills | Status: DC

## 2022-04-21 MED ORDER — SEMAGLUTIDE (1 MG/DOSE) 4 MG/3ML ~~LOC~~ SOPN: 1.0000 mg | PEN_INJECTOR | SUBCUTANEOUS | 3 refills | Status: DC

## 2022-04-21 MED ORDER — ONDANSETRON 4 MG PO TBDP: ORAL_TABLET | Freq: Three times a day (TID) | ORAL | 0 refills | Status: DC | PRN

## 2022-05-07 ENCOUNTER — Ambulatory Visit: Payer: No Typology Code available for payment source | Admitting: Family Medicine

## 2022-06-24 DIAGNOSIS — Z419 Encounter for procedure for purposes other than remedying health state, unspecified: Secondary | ICD-10-CM | POA: Diagnosis not present

## 2022-07-13 ENCOUNTER — Other Ambulatory Visit: Payer: Self-pay

## 2022-07-21 ENCOUNTER — Encounter: Payer: Self-pay | Admitting: Family Medicine

## 2022-07-22 ENCOUNTER — Other Ambulatory Visit: Payer: Self-pay

## 2022-07-22 DIAGNOSIS — G629 Polyneuropathy, unspecified: Secondary | ICD-10-CM

## 2022-07-22 DIAGNOSIS — F5104 Psychophysiologic insomnia: Secondary | ICD-10-CM

## 2022-07-22 DIAGNOSIS — E1140 Type 2 diabetes mellitus with diabetic neuropathy, unspecified: Secondary | ICD-10-CM

## 2022-07-22 DIAGNOSIS — Z8673 Personal history of transient ischemic attack (TIA), and cerebral infarction without residual deficits: Secondary | ICD-10-CM

## 2022-07-22 DIAGNOSIS — E7800 Pure hypercholesterolemia, unspecified: Secondary | ICD-10-CM

## 2022-07-22 DIAGNOSIS — G43009 Migraine without aura, not intractable, without status migrainosus: Secondary | ICD-10-CM

## 2022-07-22 DIAGNOSIS — K219 Gastro-esophageal reflux disease without esophagitis: Secondary | ICD-10-CM

## 2022-07-22 MED ORDER — ONDANSETRON 4 MG PO TBDP: ORAL_TABLET | Freq: Three times a day (TID) | ORAL | 0 refills | Status: DC | PRN

## 2022-07-22 MED ORDER — OMEPRAZOLE 20 MG PO CPDR: DELAYED_RELEASE_CAPSULE | Freq: Two times a day (BID) | ORAL | 1 refills | Status: DC

## 2022-07-22 MED ORDER — ZOLPIDEM TARTRATE 10 MG PO TABS: ORAL_TABLET | Freq: Every day | ORAL | 0 refills | Status: DC

## 2022-07-22 MED ORDER — ROSUVASTATIN CALCIUM 10 MG PO TABS: ORAL_TABLET | ORAL | 1 refills | Status: DC

## 2022-07-22 MED ORDER — GABAPENTIN 600 MG PO TABS: ORAL_TABLET | ORAL | 1 refills | Status: DC

## 2022-07-22 MED ORDER — HYDROXYZINE HCL 10 MG PO TABS: ORAL_TABLET | Freq: Three times a day (TID) | ORAL | 1 refills | Status: AC | PRN

## 2022-07-24 DIAGNOSIS — Z419 Encounter for procedure for purposes other than remedying health state, unspecified: Secondary | ICD-10-CM | POA: Diagnosis not present

## 2022-08-04 NOTE — Progress Notes (Unsigned)
Name: Shannon Soto   MRN: [id number]    DOB: Mar 08, 1971   Date:08/05/2022       Progress Note  Subjective  Chief Complaint  Follow Up  HPI  History of TIA: admitted back in August 2021 with left middle finger pain, intense, seen by neurologist, questionable TIA versus severe Raynaud's , symptoms lasted days but resolved, now symptoms still happens but associated with change in color and likely from Raynaud's.   She has seen Neurologist, Rheumatologist and Cardiologist, however lost to follow up with cardiologist, Dr. Saralyn Pilar, she is now seeing Dr. Mylo Red and will follow up yearly. She had another visit to [facility] on 03/2022 with  left side tingling and numbness. This time, CT neck, brain and MRI brain all within normal limits. Echo showed normal EF and only mild tricuspid regurgitation. She was found to have low B12 and was very anemic - but she asked me not to check iron studies at this time due to cost. She did not go see neurologist this time around due to lack of insurance   Insomnia: she has chronic insomnia, she takes Ambien and denies side effects . She still has interrupt sleep, able to sleep 4 hours with medication, but unable to sleep without the medication  DMII with peripheral neuropathy and nephropathy:  off metformin since bariatric surgery in 10/2014 , no polyphagia, but has episodes of polydipsia but no polyuria but still has nocturia  She takes  Gabapentin for neuropathy She is back on Losartan , because last urine micro was 100. A1C has been within normal for a long time , she has been tolerating Ozempic currently at 1 mg weekly , no side effects ,getting medication through the assistance program.   Bariatric Surgery: she had sleeve surgery done by Dr. Hassell Done on March 14th, 2016, Weight prior to surgery was 242 lbs She is doing well, hgbA1C started to go up, and we started her on Ozempic 02/2017 weight was in the 140 range until Fall 2020 when weight went up to 150 lbs  since Feb  2021 she lost down to 137 lbs by increasing her walks, weight was stable however when admitted to [facility] for possible TIA in August 2021  she lost her appetite and weight dropped to 131 lbs. After that she started to gain weight again but she has been  back on Ozempic 1 mg  today is down to 136 lbs, weight has been stable in the mid  130's range now  She has intermittent nausea since bariatric surgery and needs a refill of Zofran   Eczema: she is using topical Opzelura prn eczema and also some of the vitiligo spots and seems to be helping   Vitiligo: seen by Dr. Nicole Kindred, using a new topical medication but some of the spots are getting darker, but down to Opzelura once a day instead of BID . No side effects of medication it has improved with facial spots   GERD: she has a long history of reflux, symptoms were controlled with Omeprazole 20 mg  BID, discussed long term use of PPI. She has not tried skipping doses yet, she is afraid .   Chronic Idiopathic constipation: she has a long history of constipation, unable to tolerate Miralax, took Linzess in the past but it caused burning in her stomach and explosive diarrhea multiple times a day. She is currently taking otc laxative on Friday mornings to have one bowel movement a week. She states she has cramping in the middle  of the week. We will try low dose of Amitiza    Hyperlipidemia: Reviewed last labs and LDL was at goal at 58 continue Rosuvastatin . No myopathy    HTN: she is on Norvasc for Raynaud's and Losartan for microalbuminuria No chest pain or  dizziness. Palpitation is still present and stable.   Major depression: chronic symptoms since her son was born. She has been taking Fluoxetine, one daily, occasionally takes one extra pill during the day, discussed taking hydroxizine prn for anxiety . No side effects. Denies crying spells, or anhedonia, just worries constantly about her son that has special needs .She denies suicidal thoughts or ideation .  She lost her job May 19 th, 2023 she is looking for another job now. She had a severance package    Migraines: she has tried multiple medications, but could not tolerate,  episodes about 1-2 times months. Episodes are described as  pain is behind her eyes, throbbing like, associated with nausea when she has a severe episode and photophobia. It can last up to 4 hours with medication ( Tylenol  ) she is off nsaid's , she still has Nurtec at home   Raynaud's: symptoms of  fingers on both hands gets white , painful and numb, not sure of the triggers at this time.  Worse this time of the year due to cold weather. She wears gloves when cold Unchanged  Patient Active Problem List   Diagnosis Date Noted   Headache disorder 09/10/2020   TIA (transient ischemic attack) 05/06/2020   Numbness and tingling in left hand 04/16/2020   Encounter for screening colonoscopy    Polyp of sigmoid colon    Controlled type 2 diabetes mellitus with neuropathy ([place]) 05/12/2016   Raynaud's phenomenon 05/12/2016   Intertrigo 09/06/2015   Migraine headache without aura 02/28/2015   Essential hypertension 02/28/2015   History of obesity 02/28/2015   Hyperlipidemia 02/28/2015   GERD (gastroesophageal reflux disease) 02/28/2015   Vitamin D deficiency 02/28/2015   Chronic constipation 02/28/2015   Chronic insomnia 02/28/2015   Peripheral neuropathy 02/28/2015   Vitiligo 02/28/2015   Lower leg mass 02/28/2015   Depression with anxiety 02/28/2015   History of bariatric surgery 11/05/2014    Past Surgical History:  Procedure Laterality Date   ABDOMINAL HYSTERECTOMY     APPENDECTOMY     BREAST SURGERY     BREATH TEK H PYLORI N/A 08/27/2014   Procedure: BREATH TEK H PYLORI;  Surgeon: Pedro Earls, MD;  Location: [facility];  Service: General;  Laterality: N/A;   CATARACT EXTRACTION W/PHACO Left 09/20/2019   Procedure: CATARACT EXTRACTION PHACO AND INTRAOCULAR LENS PLACEMENT (IOC) LEFT 0.74 00:12.3 6.0%;  Surgeon:  Leandrew Koyanagi, MD;  Location: [place];  Service: Ophthalmology;  Laterality: Left;   CATARACT EXTRACTION W/PHACO Right 10/11/2019   Procedure: CATARACT EXTRACTION PHACO AND INTRAOCULAR LENS PLACEMENT (IOC)  RIGHT 1.55 00:24.1 6.4%;  Surgeon: Leandrew Koyanagi, MD;  Location: [place];  Service: Ophthalmology;  Laterality: Right;  diabetic   COLONOSCOPY WITH PROPOFOL N/A 02/17/2019   Procedure: COLONOSCOPY WITH PROPOFOL;  Surgeon: Lucilla Lame, MD;  Location: [place];  Service: Endoscopy;  Laterality: N/A;   HIATAL HERNIA REPAIR  11/05/2014   Procedure: LAPAROSCOPIC REPAIR OF HIATAL HERNIA;  Surgeon: Johnathan Hausen, MD;  Location: [facility];  Service: General;;   LAPAROSCOPIC GASTRIC SLEEVE RESECTION N/A 11/05/2014   Procedure: LAPAROSCOPIC GASTRIC SLEEVE RESECTION;  Surgeon: Johnathan Hausen, MD;  Location: [facility];  Service: General;  Laterality: N/A;   POLYPECTOMY  02/17/2019   Procedure: POLYPECTOMY;  Surgeon: Lucilla Lame, MD;  Location: [place];  Service: Endoscopy;;   REDUCTION MAMMAPLASTY Bilateral 1997   UPPER GI ENDOSCOPY  11/05/2014   Procedure: UPPER GI ENDOSCOPY;  Surgeon: Johnathan Hausen, MD;  Location: [facility];  Service: General;;    Family History  Problem Relation Age of Onset   Diabetes Paternal Uncle    Stroke Father    Hypertension Other    Breast cancer Maternal Grandmother 72    Social History   Tobacco Use   Smoking status: Never   Smokeless tobacco: Never  Substance Use Topics   Alcohol use: No    Alcohol/week: 0.0 standard drinks of alcohol    Comment: occasional      Current Outpatient Medications:    amLODipine (NORVASC) 10 MG tablet, TAKE 1 TABLET BY MOUTH DAILY., Disp: 90 tablet, Rfl: 1   aspirin EC 81 MG tablet, Take 81 mg by mouth daily., Disp: , Rfl:    Calcium Carb-Cholecalciferol (CALCIUM-VITAMIN D3) 500-400 MG-UNIT TABS, Take 1 tablet by mouth 3 (three) times daily., Disp: , Rfl:    cyanocobalamin 500  MCG tablet, Take 500 mcg by mouth every 3 (three) days. Mon, wed, fri, Disp: , Rfl:    ferrous gluconate ([employer] IRON) 240 (27 FE) MG tablet, Take 1 tablet (240 mg total) by mouth 3 (three) times daily with meals., Disp: 30 tablet, Rfl: 0   Fluocinolone Acetonide Body 0.01 % OIL, Apply 1 application topically to damp skin after shower daily, Disp: 118.28 mL, Rfl: 3   FLUoxetine (PROZAC) 10 MG capsule, TAKE 1 CAPSULE BY MOUTH DAILY., Disp: 90 capsule, Rfl: 1   gabapentin (NEURONTIN) 600 MG tablet, TAKE 1 & 1/2 TABLETS ('900MG'$ ) BY MOUTH AT BEDTIME, Disp: 135 tablet, Rfl: 1   hydrOXYzine (ATARAX) 10 MG tablet, TAKE 1 TABLET BY MOUTH EVERY 8 HOURS AS NEEDED, Disp: 90 tablet, Rfl: 1   losartan (COZAAR) 100 MG tablet, TAKE 1 TABLET (100 MG TOTAL) BY MOUTH DAILY., Disp: 90 tablet, Rfl: 1   Multiple Vitamins-Minerals (MULTIVITAMIN ADULTS PO), Take 1 tablet by mouth daily., Disp: , Rfl:    omeprazole (PRILOSEC) 20 MG capsule, TAKE 1 CAPSULE BY MOUTH TWICE DAILY, Disp: 180 capsule, Rfl: 1   ondansetron (ZOFRAN-ODT) 4 MG disintegrating tablet, TAKE 1 TABLET BY MOUTH EVERY 8 HOURS AS NEEDED FOR NAUSEA OR VOMITING, Disp: 20 tablet, Rfl: 0   OPZELURA 1.5 % CREA, APPLY ONE APPLICATION TOPICALLY DAILY. APPLY TO AFFECTED AREAS FOR ECZEMA AND VITILIGO, Disp: 60 g, Rfl: 3   rosuvastatin (CRESTOR) 10 MG tablet, TAKE 1 TABLET BY MOUTH DAILY IN PLACE OF PRAVASTATIN, Disp: 90 tablet, Rfl: 1   Semaglutide, 1 MG/DOSE, 4 MG/3ML SOPN, Inject 1 mg as directed once a week., Disp: 9 mL, Rfl: 3   Rimegepant Sulfate (NURTEC) 75 MG TBDP, Take 1 tablet by mouth daily as needed., Disp: 8 tablet, Rfl: 1   zolpidem (AMBIEN) 10 MG tablet, TAKE 1 TABLET BY MOUTH AT BEDTIME., Disp: 90 tablet, Rfl: 0  No Known Allergies  I personally reviewed active problem list, medication list, allergies, family history, social history, health maintenance with the patient/caregiver today.   ROS  Ten systems reviewed and is negative except as mentioned  in HPI   Objective  Vitals:   08/05/22 0956  BP: 120/70  Pulse: 76  Resp: 14  Temp: 97.9 F (36.6 C)  TempSrc: Oral  SpO2: 100%  Weight: 136 lb  1.6 oz (61.7 kg)  Height: '5\' 3"'$  (1.6 m)    Body mass index is 24.11 kg/m.  Physical Exam  Constitutional: Patient appears well-developed and well-nourished. No distress.  HEENT: head atraumatic, normocephalic, pupils equal and reactive to light, neck supple Cardiovascular: Normal rate, regular rhythm and normal heart sounds.  No murmur heard. No BLE edema. Pulmonary/Chest: Effort normal and breath sounds normal. No respiratory distress. Abdominal: Soft.  There is no tenderness. Skin: vitiligo  Psychiatric: Patient has a normal mood and affect. behavior is normal. Judgment and thought content normal.   Recent Results (from the past 2160 hour(s))  POCT HgB A1C     Status: Normal   Collection Time: 08/05/22 10:01 AM  Result Value Ref Range   Hemoglobin A1C 5.1 4.0 - 5.6 %   HbA1c POC (<> result, manual entry)     HbA1c, POC (prediabetic range)     HbA1c, POC (controlled diabetic range)       PHQ2/9:    08/05/2022    9:57 AM 04/21/2022   11:42 AM 10/28/2021    1:08 PM 07/08/2021    1:10 PM 03/11/2021    1:12 PM  Depression screen PHQ 2/9  Decreased Interest 0 0 0 0 1  Down, Depressed, Hopeless 0 0 0 0 0  PHQ - 2 Score 0 0 0 0 1  Altered sleeping 0 3 0 0 0  Tired, decreased energy 0 3 0 0 1  Change in appetite 0 0 0 0 0  Feeling bad or failure about yourself  0 0 0 0 0  Trouble concentrating 0 0 0 0 0  Moving slowly or fidgety/restless 0 0 0 0 0  Suicidal thoughts 0 0 0 0 0  PHQ-9 Score 0 6 0 0 2    phq 9 is negative   Fall Risk:    08/05/2022    9:57 AM 04/21/2022   11:42 AM 10/28/2021    1:08 PM 07/08/2021    1:10 PM 03/11/2021    1:11 PM  Fall Risk   Falls in the past year? 0 0 1 0 0  Number falls in past yr:  0 0 0 0  Injury with Fall?  0 0 0 0  Risk for fall due to : No Fall Risks No Fall Risks No Fall  Risks No Fall Risks   Follow up Falls prevention discussed;Education provided;Falls evaluation completed Falls prevention discussed Falls prevention discussed Falls prevention discussed Falls prevention discussed     Assessment & Plan  1. Type 2 diabetes with nephropathy (HCC)  - POCT HgB A1C - [facility] Diabetes Foot Exam - Urine Microalbumin w/creat. ratio  2. Mild recurrent major depression (HCC)  Continue medication  3. Breast cancer screening by mammogram  - MM 3D SCREEN BREAST BILATERAL; Future  4. Hypertension, benign  At goal   5. Raynaud's disease without gangrene  Stable  6. Chronic insomnia  - zolpidem (AMBIEN) 10 MG tablet; TAKE 1 TABLET BY MOUTH AT BEDTIME.  Dispense: 90 tablet; Refill: 0  7. B12 deficiency  Continue supplementation   8. Paresthesia of left upper and lower extremity   9. Mild atherosclerosis of both carotid arteries  On statin therapy    10. Gastroesophageal reflux disease with esophagitis without hemorrhage  Stable  11. Vitamin D deficiency   12. Migraine without aura and without status migrainosus, not intractable  - Rimegepant Sulfate (NURTEC) 75 MG TBDP; Take 1 tablet by mouth daily as needed.  Dispense: 8 tablet;  Refill: 1

## 2022-08-05 ENCOUNTER — Ambulatory Visit (INDEPENDENT_AMBULATORY_CARE_PROVIDER_SITE_OTHER): Payer: Medicaid Other | Admitting: Family Medicine

## 2022-08-05 ENCOUNTER — Encounter: Payer: Self-pay | Admitting: Family Medicine

## 2022-08-05 VITALS — BP 120/70 | HR 76 | Temp 97.9°F | Resp 14 | Ht 63.0 in | Wt 136.1 lb

## 2022-08-05 DIAGNOSIS — G43009 Migraine without aura, not intractable, without status migrainosus: Secondary | ICD-10-CM | POA: Diagnosis not present

## 2022-08-05 DIAGNOSIS — I7300 Raynaud's syndrome without gangrene: Secondary | ICD-10-CM | POA: Diagnosis not present

## 2022-08-05 DIAGNOSIS — E538 Deficiency of other specified B group vitamins: Secondary | ICD-10-CM | POA: Diagnosis not present

## 2022-08-05 DIAGNOSIS — I6523 Occlusion and stenosis of bilateral carotid arteries: Secondary | ICD-10-CM

## 2022-08-05 DIAGNOSIS — E1121 Type 2 diabetes mellitus with diabetic nephropathy: Secondary | ICD-10-CM | POA: Diagnosis not present

## 2022-08-05 DIAGNOSIS — F330 Major depressive disorder, recurrent, mild: Secondary | ICD-10-CM

## 2022-08-05 DIAGNOSIS — E559 Vitamin D deficiency, unspecified: Secondary | ICD-10-CM | POA: Diagnosis not present

## 2022-08-05 DIAGNOSIS — F5104 Psychophysiologic insomnia: Secondary | ICD-10-CM

## 2022-08-05 DIAGNOSIS — R202 Paresthesia of skin: Secondary | ICD-10-CM

## 2022-08-05 DIAGNOSIS — Z1231 Encounter for screening mammogram for malignant neoplasm of breast: Secondary | ICD-10-CM

## 2022-08-05 DIAGNOSIS — I10 Essential (primary) hypertension: Secondary | ICD-10-CM

## 2022-08-05 DIAGNOSIS — K2100 Gastro-esophageal reflux disease with esophagitis, without bleeding: Secondary | ICD-10-CM

## 2022-08-05 LAB — POCT GLYCOSYLATED HEMOGLOBIN (HGB A1C): Hemoglobin A1C: 5.1 % (ref 4.0–5.6)

## 2022-08-05 MED ORDER — NURTEC 75 MG PO TBDP: 1.0000 | ORAL_TABLET | Freq: Every day | ORAL | 1 refills | Status: DC | PRN

## 2022-08-05 MED ORDER — ZOLPIDEM TARTRATE 10 MG PO TABS: ORAL_TABLET | Freq: Every day | ORAL | 0 refills | Status: DC

## 2022-08-05 NOTE — Addendum Note (Signed)
Addended by: Steele Sizer F on: 08/05/2022 10:25 AM   Modules accepted: Orders

## 2022-08-06 LAB — CBC WITH DIFFERENTIAL/PLATELET
Absolute Monocytes: 482 cells/uL (ref 200–950)
Basophils Absolute: 61 cells/uL (ref 0–200)
Basophils Relative: 1 %
Eosinophils Absolute: 201 cells/uL (ref 15–500)
Eosinophils Relative: 3.3 %
HCT: 37.4 % (ref 35.0–45.0)
Hemoglobin: 11.9 g/dL (ref 11.7–15.5)
Lymphs Abs: 2245 cells/uL (ref 850–3900)
MCH: 26.2 pg — ABNORMAL LOW (ref 27.0–33.0)
MCHC: 31.8 g/dL — ABNORMAL LOW (ref 32.0–36.0)
MCV: 82.4 fL (ref 80.0–100.0)
MPV: 9.9 fL (ref 7.5–12.5)
Monocytes Relative: 7.9 %
Neutro Abs: 3111 cells/uL (ref 1500–7800)
Neutrophils Relative %: 51 %
Platelets: 321 10*3/uL (ref 140–400)
RBC: 4.54 10*6/uL (ref 3.80–5.10)
RDW: 13.4 % (ref 11.0–15.0)
Total Lymphocyte: 36.8 %
WBC: 6.1 10*3/uL (ref 3.8–10.8)

## 2022-08-06 LAB — MICROALBUMIN / CREATININE URINE RATIO
Creatinine, Urine: 171 mg/dL (ref 20–275)
Microalb Creat Ratio: 2 mcg/mg creat (ref <30)
Microalb, Ur: 0.3 mg/dL

## 2022-08-06 LAB — B12 AND FOLATE PANEL
Folate: 10 ng/mL
Vitamin B-12: 406 pg/mL (ref 200–1100)

## 2022-08-06 LAB — VITAMIN D 25 HYDROXY (VIT D DEFICIENCY, FRACTURES): Vit D, 25-Hydroxy: 22 ng/mL — ABNORMAL LOW (ref 30–100)

## 2022-08-15 ENCOUNTER — Telehealth: Payer: Self-pay | Admitting: Physician Assistant

## 2022-08-15 ENCOUNTER — Encounter: Payer: Self-pay | Admitting: Physician Assistant

## 2022-08-15 DIAGNOSIS — J019 Acute sinusitis, unspecified: Secondary | ICD-10-CM

## 2022-08-15 DIAGNOSIS — B9789 Other viral agents as the cause of diseases classified elsewhere: Secondary | ICD-10-CM

## 2022-08-15 MED ORDER — AZELASTINE HCL 0.1 % NA SOLN: 1.0000 | Freq: Two times a day (BID) | NASAL | 0 refills | Status: DC

## 2022-08-15 NOTE — Progress Notes (Signed)
E-Visit for Sinus Problems  We are sorry that you are not feeling well.  Here is how we plan to help!  Based on what you have shared with me it looks like you have sinusitis.  Sinusitis is inflammation and infection in the sinus cavities of the head.  Based on your presentation I believe you most likely have Acute Viral Sinusitis.This is an infection most likely caused by a virus. There is not specific treatment for viral sinusitis other than to help you with the symptoms until the infection runs its course.  You may use an oral decongestant such as Mucinex D or if you have glaucoma or high blood pressure use plain Mucinex. Saline nasal spray help and can safely be used as often as needed for congestion, I have prescribed: Azelastine nasal spray 2 sprays in each nostril twice a day  Some authorities believe that zinc sprays or the use of Echinacea may shorten the course of your symptoms.  Sinus infections are not as easily transmitted as other respiratory infection, however we still recommend that you avoid close contact with loved ones, especially the very young and elderly.  Remember to wash your hands thoroughly throughout the day as this is the number one way to prevent the spread of infection!  Home Care: Only take medications as instructed by your medical team. Do not take these medications with alcohol. A steam or ultrasonic humidifier can help congestion.  You can place a towel over your head and breathe in the steam from hot water coming from a faucet. Avoid close contacts especially the very young and the elderly. Cover your mouth when you cough or sneeze. Always remember to wash your hands.  Get Help Right Away If: You develop worsening fever or sinus pain. You develop a severe head ache or visual changes. Your symptoms persist after you have completed your treatment plan.  Make sure you Understand these instructions. Will watch your condition. Will get help right away if you are  not doing well or get worse.   Thank you for choosing an e-visit.  Your e-visit answers were reviewed by a board certified advanced clinical practitioner to complete your personal care plan. Depending upon the condition, your plan could have included both over the counter or prescription medications.  Please review your pharmacy choice. Make sure the pharmacy is open so you can pick up prescription now. If there is a problem, you may contact your provider through MyChart messaging and have the prescription routed to another pharmacy.  Your safety is important to us. If you have drug allergies check your prescription carefully.   For the next 24 hours you can use MyChart to ask questions about today's visit, request a non-urgent call back, or ask for a work or school excuse. You will get an email in the next two days asking about your experience. I hope that your e-visit has been valuable and will speed your recovery.  I have spent 5 minutes in review of e-visit questionnaire, review and updating patient chart, medical decision making and response to patient.   Minah Axelrod M Linc Renne, PA-C  

## 2022-08-15 NOTE — Progress Notes (Signed)
Duplicate encounter

## 2022-08-15 NOTE — Telephone Encounter (Signed)
This encounter was created in error - please disregard.

## 2022-08-18 ENCOUNTER — Ambulatory Visit: Payer: Self-pay | Admitting: Family Medicine

## 2022-08-24 DIAGNOSIS — Z419 Encounter for procedure for purposes other than remedying health state, unspecified: Secondary | ICD-10-CM | POA: Diagnosis not present

## 2022-08-26 ENCOUNTER — Ambulatory Visit
Admission: RE | Admit: 2022-08-26 | Discharge: 2022-08-26 | Disposition: A | Discharge status: Home / Self Care | Payer: Medicaid Other | Source: Ambulatory Visit | Attending: Family Medicine | Admitting: Family Medicine

## 2022-08-26 DIAGNOSIS — Z1231 Encounter for screening mammogram for malignant neoplasm of breast: Secondary | ICD-10-CM | POA: Insufficient documentation

## 2022-09-24 DIAGNOSIS — Z419 Encounter for procedure for purposes other than remedying health state, unspecified: Secondary | ICD-10-CM | POA: Diagnosis not present

## 2022-10-20 ENCOUNTER — Other Ambulatory Visit: Payer: Self-pay | Admitting: Family Medicine

## 2022-10-20 DIAGNOSIS — F5104 Psychophysiologic insomnia: Secondary | ICD-10-CM

## 2022-10-21 ENCOUNTER — Encounter: Payer: Self-pay | Admitting: Family Medicine

## 2022-10-21 DIAGNOSIS — F330 Major depressive disorder, recurrent, mild: Secondary | ICD-10-CM

## 2022-10-21 DIAGNOSIS — G43009 Migraine without aura, not intractable, without status migrainosus: Secondary | ICD-10-CM

## 2022-10-21 DIAGNOSIS — E1121 Type 2 diabetes mellitus with diabetic nephropathy: Secondary | ICD-10-CM

## 2022-10-21 MED ORDER — ONDANSETRON 4 MG PO TBDP: ORAL_TABLET | Freq: Three times a day (TID) | ORAL | 0 refills | Status: DC | PRN

## 2022-10-21 MED ORDER — LOSARTAN POTASSIUM 100 MG PO TABS: ORAL_TABLET | Freq: Every day | ORAL | 1 refills | Status: DC

## 2022-10-21 MED ORDER — FLUOXETINE HCL 10 MG PO CAPS: ORAL_CAPSULE | Freq: Every day | ORAL | 1 refills | Status: DC

## 2022-10-23 DIAGNOSIS — Z419 Encounter for procedure for purposes other than remedying health state, unspecified: Secondary | ICD-10-CM | POA: Diagnosis not present

## 2022-11-11 NOTE — Progress Notes (Unsigned)
Name: Shannon Soto   MRN: [id number]    DOB: [birth year]   Date:11/12/2022       Progress Note  Subjective  Chief Complaint  Follow Up  HPI  History of TIA: admitted back in August 2021 with left middle finger pain, intense, seen by neurologist, questionable TIA versus severe Raynaud's , symptoms lasted days but resolved, now symptoms still happens but associated with change in color and likely from Raynaud's.   She has seen Neurologist, Rheumatologist and Cardiologist, however lost to follow up with cardiologist, Dr. Saralyn Pilar, she is now seeing Dr. Mylo Red and will follow up yearly. She had another visit to [facility] on 03/[facility] with  left side tingling and numbness. This time, CT neck, brain and MRI brain all within normal limits. Echo showed normal EF and only mild tricuspid regurgitation. She will go back to neurologist once she gets her new job with [employer] - in one month   Insomnia: she has chronic insomnia, she takes Ambien and denies side effects . She still has interrupt sleep, able to sleep 4 hours with medication, but unable to sleep without the medication. She needs a refill today   DMII with peripheral neuropathy and nephropathy:  off metformin since bariatric surgery in 10/2014 , no polyphagia, but has episodes of polydipsia but no polyuria but still has nocturia  She takes  Gabapentin for neuropathy She is back on Losartan , because last urine micro was 100. Her  A1C has been within normal for a long time , she has been tolerating Ozempic currently at 1 mg weekly , no side effects ,getting medication through the assistance program at this time, she is going back to [facility] next month  Bariatric Surgery: she had sleeve surgery done by Dr. Hassell Done on March 14th, 2016, Weight prior to surgery was 242 lbs She is doing well, hgbA1C started to go up, and we started her on Ozempic 02/2017 weight was in the 140 range until Fall 2020 when weight went up to 150 lbs  since Feb 2021 she lost down to 137  lbs by increasing her walks, weight was stable however when admitted to [facility] for possible TIA in August 2021  she lost her appetite and weight dropped to 131 lbs. It went up to 136 lbs but is now 133 lb  She has intermittent nausea since bariatric surgery and  takes zofran prn   Eczema: she is using topical Opzelura prn eczema and also some of the vitiligo spots and seems to be helping   Vitiligo: seen by Dr. Nicole Kindred, using a new topical medication but some of the spots are getting darker, but down to Opzelura once a day instead of BID .She has noticed improvement of the lesions    GERD: she has a long history of reflux, symptoms were controlled with Omeprazole 20 mg  BID, discussed long term use of PPI. She tried to skip doses but symptoms returns   Chronic Idiopathic constipation: she has a long history of constipation, unable to tolerate Miralax, took Linzess in the past but it caused burning in her stomach and explosive diarrhea multiple times a day. She is currently taking otc laxative on Friday mornings to have one bowel movement a week. She states she is ready to go back to GI due to not being able to have a bowel movement without taking a laxative    Hyperlipidemia: Reviewed last labs and LDL was at goal at 58 continue Rosuvastatin .Unchanged    HTN:  she is on Norvasc for Raynaud's and Losartan for microalbuminuria  No chest pain or  dizziness. She states she only has palpitation when stressed   Vitamin D deficiency: reminded her to start taking vitamin D 2000 units daily   Major depression: chronic symptoms since her son was born. She has been taking Fluoxetine, one daily, occasionally takes one extra pill during the day, discussed taking hydroxizine prn for anxiety . No side effects. She was down due to being unemployed but feeling better now . 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 Episodes are described as  pain is behind her eyes, throbbing like, associated with nausea when she has a severe episode and photophobia. It can last up to 4 hours with medication ( Tylenol  ) she takes Goodypowders prn, she states did not take nurtec since covered by medicaid She wants to wait until her new insurance kicks in instead of changing medication. Reminded her not to take nsaid's   Raynaud's: symptoms of  fingers on both hands gets white , painful and numb, not sure of the triggers at this time.  Worse in the cold weather . She wears gloves when cold Stable   Patient Active Problem List   Diagnosis Date Noted   Headache disorder 09/10/2020   TIA (transient ischemic attack) 05/06/2020   Numbness and tingling in left hand 04/16/2020   Encounter for screening colonoscopy    Polyp of sigmoid colon    Controlled type 2 diabetes mellitus with neuropathy ([place]) 05/12/2016   Raynaud's phenomenon 05/12/2016   Intertrigo 09/06/2015   Migraine headache without aura 02/28/2015   Essential hypertension 02/28/2015   History of obesity 02/28/2015   Hyperlipidemia 02/28/2015   GERD (gastroesophageal reflux disease) 02/28/2015   Vitamin D deficiency 02/28/2015   Chronic constipation 02/28/2015   Chronic insomnia 02/28/2015   Peripheral neuropathy 02/28/2015   Vitiligo 02/28/2015   Lower leg mass 02/28/2015   Depression with anxiety 02/28/2015   History of bariatric surgery 11/05/2014    Past Surgical History:  Procedure Laterality Date   ABDOMINAL HYSTERECTOMY     APPENDECTOMY     BREAST SURGERY     BREATH TEK H PYLORI N/A 08/27/2014   Procedure: BREATH TEK H PYLORI;  Surgeon: Pedro Earls, MD;  Location: [facility];  Service: General;  Laterality: N/A;   CATARACT EXTRACTION W/PHACO Left 09/20/2019   Procedure: CATARACT EXTRACTION PHACO AND INTRAOCULAR LENS PLACEMENT (IOC) LEFT 0.74 00:12.3 6.0%;   Surgeon: Leandrew Koyanagi, MD;  Location: [place];  Service: Ophthalmology;  Laterality: Left;   CATARACT EXTRACTION W/PHACO Right 10/11/2019   Procedure: CATARACT EXTRACTION PHACO AND INTRAOCULAR LENS PLACEMENT (IOC)  RIGHT 1.55 00:24.1 6.4%;  Surgeon: Leandrew Koyanagi, MD;  Location: [place];  Service: Ophthalmology;  Laterality: Right;  diabetic   COLONOSCOPY WITH PROPOFOL N/A 02/17/2019   Procedure: COLONOSCOPY WITH PROPOFOL;  Surgeon: Lucilla Lame, MD;  Location: [place];  Service: Endoscopy;  Laterality: N/A;   HIATAL HERNIA REPAIR  11/05/2014   Procedure: LAPAROSCOPIC REPAIR OF HIATAL HERNIA;  Surgeon: Johnathan Hausen, MD;  Location: [facility];  Service: General;;   LAPAROSCOPIC GASTRIC SLEEVE RESECTION N/A 11/05/2014   Procedure: LAPAROSCOPIC GASTRIC SLEEVE RESECTION;  Surgeon: Johnathan Hausen, MD;  Location: [facility];  Service: General;  Laterality: N/A;  POLYPECTOMY  02/17/2019   Procedure: POLYPECTOMY;  Surgeon: Lucilla Lame, MD;  Location: [place];  Service: Endoscopy;;   REDUCTION MAMMAPLASTY Bilateral 1997   UPPER GI ENDOSCOPY  11/05/2014   Procedure: UPPER GI ENDOSCOPY;  Surgeon: Johnathan Hausen, MD;  Location: [facility];  Service: General;;    Family History  Problem Relation Age of Onset   Diabetes Paternal Uncle    Stroke Father    Hypertension Other    Breast cancer Maternal Grandmother 72    Social History   Tobacco Use   Smoking status: Never   Smokeless tobacco: Never  Substance Use Topics   Alcohol use: No    Alcohol/week: 0.0 standard drinks of alcohol    Comment: occasional      Current Outpatient Medications:    amLODipine (NORVASC) 10 MG tablet, TAKE 1 TABLET BY MOUTH DAILY., Disp: 90 tablet, Rfl: 1   aspirin EC 81 MG tablet, Take 81 mg by mouth daily., Disp: , Rfl:    azelastine (ASTELIN) 0.1 % nasal spray, Place 1 spray into both nostrils 2 (two) times daily. Use in each nostril as directed, Disp: 30 mL, Rfl:  0   Calcium Carb-Cholecalciferol (CALCIUM-VITAMIN D3) 500-400 MG-UNIT TABS, Take 1 tablet by mouth 3 (three) times daily., Disp: , Rfl:    cyanocobalamin 500 MCG tablet, Take 500 mcg by mouth every 3 (three) days. Mon, wed, fri, Disp: , Rfl:    ferrous gluconate ([employer] IRON) 240 (27 FE) MG tablet, Take 1 tablet (240 mg total) by mouth 3 (three) times daily with meals., Disp: 30 tablet, Rfl: 0   Fluocinolone Acetonide Body 0.01 % OIL, Apply 1 application topically to damp skin after shower daily, Disp: 118.28 mL, Rfl: 3   FLUoxetine (PROZAC) 10 MG capsule, TAKE 1 CAPSULE BY MOUTH DAILY., Disp: 90 capsule, Rfl: 1   gabapentin (NEURONTIN) 600 MG tablet, TAKE 1 & 1/2 TABLETS (900MG ) BY MOUTH AT BEDTIME, Disp: 135 tablet, Rfl: 1   hydrOXYzine (ATARAX) 10 MG tablet, TAKE 1 TABLET BY MOUTH EVERY 8 HOURS AS NEEDED, Disp: 90 tablet, Rfl: 1   losartan (COZAAR) 100 MG tablet, TAKE 1 TABLET (100 MG TOTAL) BY MOUTH DAILY., Disp: 90 tablet, Rfl: 1   Multiple Vitamins-Minerals (MULTIVITAMIN ADULTS PO), Take 1 tablet by mouth daily., Disp: , Rfl:    omeprazole (PRILOSEC) 20 MG capsule, TAKE 1 CAPSULE BY MOUTH TWICE DAILY, Disp: 180 capsule, Rfl: 1   ondansetron (ZOFRAN-ODT) 4 MG disintegrating tablet, TAKE 1 TABLET BY MOUTH EVERY 8 HOURS AS NEEDED FOR NAUSEA OR VOMITING, Disp: 20 tablet, Rfl: 0   OPZELURA 1.5 % CREA, APPLY ONE APPLICATION TOPICALLY DAILY. APPLY TO AFFECTED AREAS FOR ECZEMA AND VITILIGO, Disp: 60 g, Rfl: 3   Rimegepant Sulfate (NURTEC) 75 MG TBDP, Take 1 tablet by mouth daily as needed., Disp: 8 tablet, Rfl: 1   rosuvastatin (CRESTOR) 10 MG tablet, TAKE 1 TABLET BY MOUTH DAILY IN PLACE OF PRAVASTATIN, Disp: 90 tablet, Rfl: 1   Semaglutide, 1 MG/DOSE, 4 MG/3ML SOPN, Inject 1 mg as directed once a week., Disp: 9 mL, Rfl: 3   zolpidem (AMBIEN) 10 MG tablet, TAKE 1 TABLET BY MOUTH AT BEDTIME., Disp: 90 tablet, Rfl: 0  No Known Allergies  I personally reviewed active problem list, medication list,  allergies, family history, social history, health maintenance with the patient/caregiver today.   ROS   Ten systems reviewed and is negative except as mentioned in HPI   Objective  Vitals:   11/12/22  0819  BP: 116/70  Pulse: 74  Resp: 16  Weight: 133 lb (60.3 kg)  Height: 5\' 3"  (1.6 m)    Body mass index is 23.56 kg/m.  Physical Exam  Constitutional: Patient appears well-developed and well-nourished.  No distress.  HEENT: head atraumatic, normocephalic, pupils equal and reactive to light, neck supple Cardiovascular: Normal rate, regular rhythm and normal heart sounds.  No murmur heard. No BLE edema. Pulmonary/Chest: Effort normal and breath sounds normal. No respiratory distress. Abdominal: Soft.  There is no tenderness. Psychiatric: Patient has a normal mood and affect. behavior is normal. Judgment and thought content normal.   Recent Results (from the past 2160 hour(s))  POCT HgB A1C     Status: None   Collection Time: 11/12/22  8:21 AM  Result Value Ref Range   Hemoglobin A1C 5.4 4.0 - 5.6 %   HbA1c POC (<> result, manual entry)     HbA1c, POC (prediabetic range)     HbA1c, POC (controlled diabetic range)       PHQ2/9:    11/12/2022    8:20 AM 08/05/2022    9:57 AM 04/21/2022   11:42 AM 10/28/2021    1:08 PM 07/08/2021    1:10 PM  Depression screen PHQ 2/9  Decreased Interest 1 0 0 0 0  Down, Depressed, Hopeless 0 0 0 0 0  PHQ - 2 Score 1 0 0 0 0  Altered sleeping 3 0 3 0 0  Tired, decreased energy 1 0 3 0 0  Change in appetite 0 0 0 0 0  Feeling bad or failure about yourself  0 0 0 0 0  Trouble concentrating 0 0 0 0 0  Moving slowly or fidgety/restless 0 0 0 0 0  Suicidal thoughts 0 0 0 0 0  PHQ-9 Score 5 0 6 0 0    phq 9 is positive   Fall Risk:    11/12/2022    8:20 AM 08/05/2022    9:57 AM 04/21/2022   11:42 AM 10/28/2021    1:08 PM 07/08/2021    1:10 PM  Fall Risk   Falls in the past year? 0 0 0 1 0  Number falls in past yr: 0  0 0 0   Injury with Fall? 0  0 0 0  Risk for fall due to : No Fall Risks No Fall Risks No Fall Risks No Fall Risks No Fall Risks  Follow up Falls prevention discussed Falls prevention discussed;Education provided;Falls evaluation completed Falls prevention discussed Falls prevention discussed Falls prevention discussed      Functional Status Survey: Is the patient deaf or have difficulty hearing?: No Does the patient have difficulty seeing, even when wearing glasses/contacts?: No Does the patient have difficulty concentrating, remembering, or making decisions?: No Does the patient have difficulty walking or climbing stairs?: No Does the patient have difficulty dressing or bathing?: No Does the patient have difficulty doing errands alone such as visiting a doctor's office or shopping?: No    Assessment & Plan  1. Type 2 diabetes with nephropathy (HCC)  - POCT HgB A1C  2. Mild recurrent major depression (HCC)  stable  3. Chronic insomnia  - zolpidem (AMBIEN) 10 MG tablet; TAKE 1 TABLET BY MOUTH AT BEDTIME.  Dispense: 90 tablet; Refill: 0  4. Hypertension, benign  Towards low end of normal but no orthostatic changes   5. Raynaud's disease without gangrene  Stable   6. Migraine without aura and without status migrainosus, not intractable  Stop good powders   7. History of bariatric surgery   8. Gastroesophageal reflux disease with esophagitis without hemorrhage  She will see GI and may need EGD   9. Mild atherosclerosis of both carotid arteries  She will follow up with neurologist   10. Vitamin D deficiency  Needs to start vitamin D 2000 units daily   11. B12 deficiency   12. Pure hypercholesterolemia

## 2022-11-12 ENCOUNTER — Ambulatory Visit (INDEPENDENT_AMBULATORY_CARE_PROVIDER_SITE_OTHER): Payer: Medicaid Other | Admitting: Family Medicine

## 2022-11-12 VITALS — BP 116/70 | HR 74 | Resp 16 | Ht 63.0 in | Wt 133.0 lb

## 2022-11-12 DIAGNOSIS — E1121 Type 2 diabetes mellitus with diabetic nephropathy: Secondary | ICD-10-CM | POA: Diagnosis not present

## 2022-11-12 DIAGNOSIS — F5104 Psychophysiologic insomnia: Secondary | ICD-10-CM

## 2022-11-12 DIAGNOSIS — E559 Vitamin D deficiency, unspecified: Secondary | ICD-10-CM

## 2022-11-12 DIAGNOSIS — F330 Major depressive disorder, recurrent, mild: Secondary | ICD-10-CM | POA: Diagnosis not present

## 2022-11-12 DIAGNOSIS — I6523 Occlusion and stenosis of bilateral carotid arteries: Secondary | ICD-10-CM | POA: Diagnosis not present

## 2022-11-12 DIAGNOSIS — I7300 Raynaud's syndrome without gangrene: Secondary | ICD-10-CM

## 2022-11-12 DIAGNOSIS — Z9884 Bariatric surgery status: Secondary | ICD-10-CM | POA: Diagnosis not present

## 2022-11-12 DIAGNOSIS — K2100 Gastro-esophageal reflux disease with esophagitis, without bleeding: Secondary | ICD-10-CM | POA: Diagnosis not present

## 2022-11-12 DIAGNOSIS — I10 Essential (primary) hypertension: Secondary | ICD-10-CM | POA: Diagnosis not present

## 2022-11-12 DIAGNOSIS — E7800 Pure hypercholesterolemia, unspecified: Secondary | ICD-10-CM

## 2022-11-12 DIAGNOSIS — E538 Deficiency of other specified B group vitamins: Secondary | ICD-10-CM

## 2022-11-12 DIAGNOSIS — G43009 Migraine without aura, not intractable, without status migrainosus: Secondary | ICD-10-CM

## 2022-11-12 LAB — POCT GLYCOSYLATED HEMOGLOBIN (HGB A1C): Hemoglobin A1C: 5.4 % (ref 4.0–5.6)

## 2022-11-12 MED ORDER — ZOLPIDEM TARTRATE 10 MG PO TABS: ORAL_TABLET | Freq: Every day | ORAL | 0 refills | Status: DC

## 2022-11-23 DIAGNOSIS — Z419 Encounter for procedure for purposes other than remedying health state, unspecified: Secondary | ICD-10-CM | POA: Diagnosis not present

## 2022-12-07 ENCOUNTER — Ambulatory Visit: Payer: Medicaid Other | Admitting: Family Medicine

## 2022-12-23 DIAGNOSIS — Z419 Encounter for procedure for purposes other than remedying health state, unspecified: Secondary | ICD-10-CM | POA: Diagnosis not present

## 2023-01-18 ENCOUNTER — Encounter: Payer: Self-pay | Admitting: Family Medicine

## 2023-01-18 DIAGNOSIS — G629 Polyneuropathy, unspecified: Secondary | ICD-10-CM

## 2023-01-18 DIAGNOSIS — E1140 Type 2 diabetes mellitus with diabetic neuropathy, unspecified: Secondary | ICD-10-CM

## 2023-01-18 DIAGNOSIS — I10 Essential (primary) hypertension: Secondary | ICD-10-CM

## 2023-01-18 DIAGNOSIS — I7300 Raynaud's syndrome without gangrene: Secondary | ICD-10-CM

## 2023-01-18 DIAGNOSIS — K219 Gastro-esophageal reflux disease without esophagitis: Secondary | ICD-10-CM

## 2023-01-18 DIAGNOSIS — G43009 Migraine without aura, not intractable, without status migrainosus: Secondary | ICD-10-CM

## 2023-01-19 ENCOUNTER — Other Ambulatory Visit: Payer: Self-pay

## 2023-01-19 DIAGNOSIS — E7800 Pure hypercholesterolemia, unspecified: Secondary | ICD-10-CM

## 2023-01-19 DIAGNOSIS — Z8673 Personal history of transient ischemic attack (TIA), and cerebral infarction without residual deficits: Secondary | ICD-10-CM

## 2023-01-19 MED ORDER — FLUOXETINE HCL 10 MG PO CAPS
10.0000 mg | ORAL_CAPSULE | Freq: Every day | ORAL | 0 refills | Status: DC
  Filled 2023-01-19: qty 90, 90d supply, fill #0

## 2023-01-19 MED ORDER — LOSARTAN POTASSIUM 100 MG PO TABS
100.0000 mg | ORAL_TABLET | Freq: Every day | ORAL | 0 refills | Status: DC
  Filled 2023-01-19: qty 90, 90d supply, fill #0

## 2023-01-19 MED ORDER — ONDANSETRON 4 MG PO TBDP
4.0000 mg | ORAL_TABLET | Freq: Three times a day (TID) | ORAL | 0 refills | Status: DC | PRN
  Filled 2023-01-19: qty 20, 7d supply, fill #0

## 2023-01-19 MED ORDER — GABAPENTIN 600 MG PO TABS
900.0000 mg | ORAL_TABLET | Freq: Every day | ORAL | 0 refills | Status: DC
  Filled 2023-01-19: qty 60, 40d supply, fill #0

## 2023-01-19 MED ORDER — AMLODIPINE BESYLATE 10 MG PO TABS
10.0000 mg | ORAL_TABLET | Freq: Every day | ORAL | 0 refills | Status: DC
  Filled 2023-01-19: qty 30, 30d supply, fill #0

## 2023-01-19 MED ORDER — OMEPRAZOLE 20 MG PO CPDR
20.0000 mg | DELAYED_RELEASE_CAPSULE | Freq: Two times a day (BID) | ORAL | 0 refills | Status: DC
  Filled 2023-01-19: qty 60, 30d supply, fill #0

## 2023-01-19 MED ORDER — ROSUVASTATIN CALCIUM 10 MG PO TABS
10.0000 mg | ORAL_TABLET | Freq: Every day | ORAL | 0 refills | Status: DC
  Filled 2023-01-19: qty 30, 30d supply, fill #0

## 2023-01-23 DIAGNOSIS — Z419 Encounter for procedure for purposes other than remedying health state, unspecified: Secondary | ICD-10-CM | POA: Diagnosis not present

## 2023-01-24 ENCOUNTER — Other Ambulatory Visit: Payer: Self-pay

## 2023-01-24 ENCOUNTER — Telehealth: Payer: Managed Care, Other (non HMO) | Admitting: Physician Assistant

## 2023-01-24 DIAGNOSIS — N3000 Acute cystitis without hematuria: Secondary | ICD-10-CM | POA: Diagnosis not present

## 2023-01-24 MED ORDER — NITROFURANTOIN MONOHYD MACRO 100 MG PO CAPS
100.0000 mg | ORAL_CAPSULE | Freq: Two times a day (BID) | ORAL | 0 refills | Status: AC
  Filled 2023-01-24: qty 10, 5d supply, fill #0

## 2023-01-24 MED ORDER — FLUCONAZOLE 150 MG PO TABS
150.0000 mg | ORAL_TABLET | Freq: Every day | ORAL | 0 refills | Status: DC
  Filled 2023-01-24: qty 1, 1d supply, fill #0

## 2023-01-24 NOTE — Addendum Note (Signed)
Addended by: Christeen Douglas Z on: 01/24/2023 05:02 PM   Modules accepted: Orders

## 2023-01-24 NOTE — Progress Notes (Signed)
E-Visit for Urinary Problems  We are sorry that you are not feeling well.  Here is how we plan to help!  Based on what you shared with me it looks like you most likely have a simple urinary tract infection.  A UTI (Urinary Tract Infection) is a bacterial infection of the bladder.  Most cases of urinary tract infections are simple to treat but a key part of your care is to encourage you to drink plenty of fluids and watch your symptoms carefully.  I have prescribed MacroBid 100 mg twice a day for 5 days.  Your symptoms should gradually improve. Call us if the burning in your urine worsens, you develop worsening fever, back pain or pelvic pain or if your symptoms do not resolve after completing the antibiotic.  I will also send in Diflucan for yeast infection.   Urinary tract infections can be prevented by drinking plenty of water to keep your body hydrated.  Also be sure when you wipe, wipe from front to back and don't hold it in!  If possible, empty your bladder every 4 hours.  HOME CARE Drink plenty of fluids Compete the full course of the antibiotics even if the symptoms resolve Remember, when you need to go.go. Holding in your urine can increase the likelihood of getting a UTI! GET HELP RIGHT AWAY IF: You cannot urinate You get a high fever Worsening back pain occurs You see blood in your urine You feel sick to your stomach or throw up You feel like you are going to pass out  MAKE SURE YOU  Understand these instructions. Will watch your condition. Will get help right away if you are not doing well or get worse.   Thank you for choosing an e-visit.  Your e-visit answers were reviewed by a board certified advanced clinical practitioner to complete your personal care plan. Depending upon the condition, your plan could have included both over the counter or prescription medications.  Please review your pharmacy choice. Make sure the pharmacy is open so you can pick up  prescription now. If there is a problem, you may contact your provider through [employer] and have the prescription routed to another pharmacy.  Your safety is important to [place]. If you have drug allergies check your prescription carefully.   For the next 24 hours you can use MyChart to ask questions about today's visit, request a non-urgent call back, or ask for a work or school excuse. You will get an email in the next two days asking about your experience. I hope that your e-visit has been valuable and will speed your recovery.  I have spent 5 minutes in review of e-visit questionnaire, review and updating patient chart, medical decision making and response to patient.   Tylene Fantasia Ward, PA-C

## 2023-01-25 ENCOUNTER — Other Ambulatory Visit: Payer: Self-pay

## 2023-02-15 ENCOUNTER — Other Ambulatory Visit: Payer: Self-pay

## 2023-02-15 ENCOUNTER — Encounter: Payer: Self-pay | Admitting: Family Medicine

## 2023-02-15 DIAGNOSIS — E1140 Type 2 diabetes mellitus with diabetic neuropathy, unspecified: Secondary | ICD-10-CM

## 2023-02-15 DIAGNOSIS — I10 Essential (primary) hypertension: Secondary | ICD-10-CM

## 2023-02-15 DIAGNOSIS — I7300 Raynaud's syndrome without gangrene: Secondary | ICD-10-CM

## 2023-02-15 DIAGNOSIS — G43009 Migraine without aura, not intractable, without status migrainosus: Secondary | ICD-10-CM

## 2023-02-15 DIAGNOSIS — Z8673 Personal history of transient ischemic attack (TIA), and cerebral infarction without residual deficits: Secondary | ICD-10-CM

## 2023-02-15 DIAGNOSIS — F5104 Psychophysiologic insomnia: Secondary | ICD-10-CM

## 2023-02-15 DIAGNOSIS — G629 Polyneuropathy, unspecified: Secondary | ICD-10-CM

## 2023-02-15 DIAGNOSIS — K219 Gastro-esophageal reflux disease without esophagitis: Secondary | ICD-10-CM

## 2023-02-15 DIAGNOSIS — E7800 Pure hypercholesterolemia, unspecified: Secondary | ICD-10-CM

## 2023-02-15 MED ORDER — AMLODIPINE BESYLATE 10 MG PO TABS
10.0000 mg | ORAL_TABLET | Freq: Every day | ORAL | 3 refills | Status: DC
  Filled 2023-02-15: qty 30, 30d supply, fill #0

## 2023-02-15 MED ORDER — OMEPRAZOLE 20 MG PO CPDR
20.0000 mg | DELAYED_RELEASE_CAPSULE | Freq: Two times a day (BID) | ORAL | 3 refills | Status: DC
  Filled 2023-02-15: qty 60, 30d supply, fill #0

## 2023-02-15 MED ORDER — GABAPENTIN 600 MG PO TABS
900.0000 mg | ORAL_TABLET | Freq: Every day | ORAL | 3 refills | Status: DC
  Filled 2023-02-15: qty 60, 40d supply, fill #0

## 2023-02-15 MED ORDER — ONDANSETRON 4 MG PO TBDP
4.0000 mg | ORAL_TABLET | Freq: Three times a day (TID) | ORAL | 0 refills | Status: DC | PRN
  Filled 2023-02-15: qty 20, 7d supply, fill #0

## 2023-02-15 MED ORDER — ZOLPIDEM TARTRATE 10 MG PO TABS
10.0000 mg | ORAL_TABLET | Freq: Every day | ORAL | 3 refills | Status: DC
  Filled 2023-02-15: qty 30, 30d supply, fill #0
  Filled 2023-03-15: qty 30, 30d supply, fill #1
  Filled 2023-04-13: qty 30, 30d supply, fill #2
  Filled 2023-05-16: qty 30, 30d supply, fill #3

## 2023-02-15 MED ORDER — ROSUVASTATIN CALCIUM 10 MG PO TABS
10.0000 mg | ORAL_TABLET | Freq: Every day | ORAL | 3 refills | Status: DC
  Filled 2023-02-15: qty 30, 30d supply, fill #0

## 2023-02-16 ENCOUNTER — Other Ambulatory Visit: Payer: Self-pay

## 2023-02-16 DIAGNOSIS — I7300 Raynaud's syndrome without gangrene: Secondary | ICD-10-CM

## 2023-02-16 DIAGNOSIS — Z8673 Personal history of transient ischemic attack (TIA), and cerebral infarction without residual deficits: Secondary | ICD-10-CM

## 2023-02-16 DIAGNOSIS — G629 Polyneuropathy, unspecified: Secondary | ICD-10-CM

## 2023-02-16 DIAGNOSIS — G43009 Migraine without aura, not intractable, without status migrainosus: Secondary | ICD-10-CM

## 2023-02-16 DIAGNOSIS — E1140 Type 2 diabetes mellitus with diabetic neuropathy, unspecified: Secondary | ICD-10-CM

## 2023-02-16 DIAGNOSIS — I10 Essential (primary) hypertension: Secondary | ICD-10-CM

## 2023-02-16 DIAGNOSIS — K219 Gastro-esophageal reflux disease without esophagitis: Secondary | ICD-10-CM

## 2023-02-16 DIAGNOSIS — E7800 Pure hypercholesterolemia, unspecified: Secondary | ICD-10-CM

## 2023-02-16 MED ORDER — ONDANSETRON 4 MG PO TBDP: 4.0000 mg | ORAL_TABLET | Freq: Three times a day (TID) | ORAL | 0 refills | Status: DC | PRN

## 2023-02-16 MED ORDER — GABAPENTIN 600 MG PO TABS
900.0000 mg | ORAL_TABLET | Freq: Every day | ORAL | 0 refills | Status: DC
  Filled 2023-02-16: qty 135, 90d supply, fill #0

## 2023-02-16 MED ORDER — OMEPRAZOLE 20 MG PO CPDR: 20.0000 mg | DELAYED_RELEASE_CAPSULE | Freq: Two times a day (BID) | ORAL | 0 refills | Status: DC

## 2023-02-16 MED ORDER — AMLODIPINE BESYLATE 10 MG PO TABS
10.0000 mg | ORAL_TABLET | Freq: Every day | ORAL | 0 refills | Status: DC
  Filled 2023-03-15: qty 90, 90d supply, fill #0

## 2023-02-16 MED ORDER — ROSUVASTATIN CALCIUM 10 MG PO TABS
10.0000 mg | ORAL_TABLET | Freq: Every day | ORAL | 0 refills | Status: DC
  Filled 2023-03-15: qty 90, 90d supply, fill #0

## 2023-02-16 NOTE — Telephone Encounter (Signed)
Changed to 90 day supply

## 2023-02-18 ENCOUNTER — Other Ambulatory Visit: Payer: Self-pay

## 2023-02-19 ENCOUNTER — Other Ambulatory Visit: Payer: Self-pay

## 2023-02-22 DIAGNOSIS — Z419 Encounter for procedure for purposes other than remedying health state, unspecified: Secondary | ICD-10-CM | POA: Diagnosis not present

## 2023-03-15 ENCOUNTER — Other Ambulatory Visit: Payer: Self-pay

## 2023-03-15 ENCOUNTER — Other Ambulatory Visit: Payer: Self-pay | Admitting: Family Medicine

## 2023-03-15 ENCOUNTER — Encounter: Payer: Self-pay | Admitting: Family Medicine

## 2023-03-15 DIAGNOSIS — E1121 Type 2 diabetes mellitus with diabetic nephropathy: Secondary | ICD-10-CM

## 2023-03-15 MED ORDER — SEMAGLUTIDE (1 MG/DOSE) 4 MG/3ML ~~LOC~~ SOPN
1.0000 mg | PEN_INJECTOR | SUBCUTANEOUS | 0 refills | Status: DC
  Filled 2023-03-15 – 2023-03-16 (×2): qty 3, 28d supply, fill #0
  Filled 2023-04-14: qty 6, 56d supply, fill #1

## 2023-03-15 MED ORDER — NYSTATIN 100000 UNIT/GM EX POWD
1.0000 | Freq: Three times a day (TID) | CUTANEOUS | 0 refills | Status: DC
  Filled 2023-03-15: qty 60, 20d supply, fill #0

## 2023-03-16 ENCOUNTER — Other Ambulatory Visit: Payer: Self-pay

## 2023-03-23 DIAGNOSIS — N819 Female genital prolapse, unspecified: Secondary | ICD-10-CM | POA: Diagnosis not present

## 2023-03-23 DIAGNOSIS — Z01419 Encounter for gynecological examination (general) (routine) without abnormal findings: Secondary | ICD-10-CM | POA: Diagnosis not present

## 2023-03-23 DIAGNOSIS — Z6824 Body mass index (BMI) 24.0-24.9, adult: Secondary | ICD-10-CM | POA: Diagnosis not present

## 2023-03-25 DIAGNOSIS — Z419 Encounter for procedure for purposes other than remedying health state, unspecified: Secondary | ICD-10-CM | POA: Diagnosis not present

## 2023-04-14 ENCOUNTER — Encounter: Payer: Self-pay | Admitting: Family Medicine

## 2023-04-14 ENCOUNTER — Other Ambulatory Visit: Payer: Self-pay

## 2023-04-14 DIAGNOSIS — F330 Major depressive disorder, recurrent, mild: Secondary | ICD-10-CM

## 2023-04-14 DIAGNOSIS — E1121 Type 2 diabetes mellitus with diabetic nephropathy: Secondary | ICD-10-CM

## 2023-04-14 MED ORDER — FLUOXETINE HCL 10 MG PO CAPS
10.0000 mg | ORAL_CAPSULE | Freq: Every day | ORAL | 0 refills | Status: DC
  Filled 2023-04-14: qty 90, 90d supply, fill #0

## 2023-04-14 MED ORDER — LOSARTAN POTASSIUM 100 MG PO TABS
100.0000 mg | ORAL_TABLET | Freq: Every day | ORAL | 0 refills | Status: DC
  Filled 2023-04-14: qty 90, 90d supply, fill #0

## 2023-04-25 DIAGNOSIS — Z419 Encounter for procedure for purposes other than remedying health state, unspecified: Secondary | ICD-10-CM | POA: Diagnosis not present

## 2023-04-29 DIAGNOSIS — Z0100 Encounter for examination of eyes and vision without abnormal findings: Secondary | ICD-10-CM | POA: Diagnosis not present

## 2023-04-29 DIAGNOSIS — E119 Type 2 diabetes mellitus without complications: Secondary | ICD-10-CM | POA: Diagnosis not present

## 2023-04-29 DIAGNOSIS — Z961 Presence of intraocular lens: Secondary | ICD-10-CM | POA: Diagnosis not present

## 2023-04-29 LAB — HM DIABETES EYE EXAM

## 2023-05-17 ENCOUNTER — Other Ambulatory Visit: Payer: Self-pay

## 2023-05-25 ENCOUNTER — Encounter: Payer: Self-pay | Admitting: Family Medicine

## 2023-05-25 ENCOUNTER — Other Ambulatory Visit: Payer: Self-pay

## 2023-05-25 DIAGNOSIS — E1140 Type 2 diabetes mellitus with diabetic neuropathy, unspecified: Secondary | ICD-10-CM

## 2023-05-25 DIAGNOSIS — G629 Polyneuropathy, unspecified: Secondary | ICD-10-CM

## 2023-05-25 DIAGNOSIS — Z419 Encounter for procedure for purposes other than remedying health state, unspecified: Secondary | ICD-10-CM | POA: Diagnosis not present

## 2023-05-25 MED ORDER — GABAPENTIN 600 MG PO TABS
900.0000 mg | ORAL_TABLET | Freq: Every day | ORAL | 0 refills | Status: DC
  Filled 2023-05-25: qty 45, 30d supply, fill #0

## 2023-06-01 NOTE — Progress Notes (Unsigned)
Name: Shannon Soto   MRN: [id number]    DOB: [birth year]   Date:06/02/2023       Progress Note  Subjective  Chief Complaint  Follow Up  HPI  History of TIA: admitted back in August 2021 with left middle finger pain, intense, seen by neurologist, questionable TIA versus severe Raynaud's , symptoms lasted days but resolved, now symptoms still happens but associated with change in color and likely from Raynaud's.   She has seen Neurologist, Rheumatologist and Cardiologist, however lost to follow up with cardiologist, Dr. Darrold Junker, she is now seeing Dr. Myriam Forehand and will follow up yearly. She had another visit to [facility] on 03/2022 with  left side tingling and numbness. This time, CT neck, brain and MRI brain all within normal limits. Echo showed normal EF and only mild tricuspid regurgitation. She will schedule follow up with her neurologist - Dr Malvin Johns   Insomnia: she has chronic insomnia, she takes Ambien and denies side effects . She still has interrupt sleep, able to sleep 4 hours with medication, but unable to sleep without the medication. She has tried other medications but does not last either. She also takes hydroxyzine prn to help with sleep   DMII with peripheral neuropathy and nephropathy:  off metformin since bariatric surgery in 10/2014 , no polyphagia, but has episodes of polydipsia but no polyuria but still has nocturia  She takes  Gabapentin for neuropathy. She is back on Losartan ,due to increase in urine micro but last level was within normal limits. Her A1C has been within normal for a long time , she has been tolerating Ozempic currently at 1 mg weekly , no side effects.   Bariatric Surgery: she had sleeve surgery done by Dr. Daphine Deutscher on March 14th, 2016, Weight prior to surgery was 242 lbs She is doing well, hgbA1C started to go up, and we started her on Ozempic 02/2017 weight was in the 140 range until Fall 2020 when weight went up to 150 lbs  since Feb 2021 she lost down to 137 lbs by  increasing her walks, weight was stable however when admitted to [facility] for possible TIA in August 2021  she lost her appetite and weight dropped to 131 lbs. Weight is back to  134 lbs today.   Eczema: she is using topical Opzelura prn eczema and also some of the vitiligo spots and seems to be helping   Vitiligo: seen by Dr. Roseanne Reno, using a new topical medication but some of the spots are getting darker, but down to Opzelura once a day instead of BID .and is doing well    GERD: she has a long history of reflux, symptoms were controlled with Omeprazole 20 mg  BID, discussed long term use of PPI. She tried to skip doses but symptoms returns . Continue medication   Chronic Idiopathic constipation: she has a long history of constipation, unable to tolerate Miralax, took Linzess in the past but it caused burning in her stomach and explosive diarrhea multiple times a day. She is also unable to tolerate Amitiza She is currently taking otc laxative on Friday mornings to have one bowel movement a week. She states she is ready to go back to GI due to not being able to have a bowel movement without taking a laxative . We will place referral today    Hyperlipidemia: Reviewed last labs and LDL was at goal at 58 continue Rosuvastatin , we will recheck labs today    HTN: she is on  Norvasc for Raynaud's and Losartan for microalbuminuria  No chest pain or  dizziness. She states she only has palpitation when stressed . BP is normal today   Vitamin D deficiency: she has been taking supplements daily   B12 deficiency: taking only three times a week and we will recheck levels   History of iron deficiency anemia: still taking iron and we will check iron studies   Major depression: chronic symptoms since her son was born. She has been taking Fluoxetine, one daily, occasionally takes one extra pill during the day, discussed taking hydroxizine prn for anxiety . No side effects. She was down due to being unemployed but  feeling better now . 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 Episodes are described as  pain is behind her eyes, throbbing like, associated with nausea when she has a severe episode and photophobia. It can last up to 4 hours with medication ( Tylenol  ) she takes Goodypowders prn , she states Nurtec not covered and we will try a PA   Raynaud's: symptoms of  fingers on both hands gets white , painful and numb, not sure of the triggers at this time.  Worse in the cold weather . She wears gloves when cold . Unchanged    Patient Active Problem List   Diagnosis Date Noted   B12 deficiency 11/12/2022   Mild recurrent major depression (HCC) 11/12/2022   Hypertension, benign 11/12/2022   Type 2 diabetes with nephropathy (HCC) 11/12/2022   Headache disorder 09/10/2020   TIA (transient ischemic attack) 05/06/2020   Numbness and tingling in left hand 04/16/2020   Encounter for screening colonoscopy    Polyp of sigmoid colon    Controlled type 2 diabetes mellitus with neuropathy (HCC) 05/12/2016   Raynaud's disease without gangrene 05/12/2016   Intertrigo 09/06/2015   Migraine headache without aura 02/28/2015   Essential hypertension 02/28/2015   History of obesity 02/28/2015   Hyperlipidemia 02/28/2015   GERD (gastroesophageal reflux disease) 02/28/2015   Vitamin D deficiency 02/28/2015   Chronic constipation 02/28/2015   Chronic insomnia 02/28/2015   Peripheral neuropathy 02/28/2015   Vitiligo 02/28/2015   Lower leg mass 02/28/2015   Depression with anxiety 02/28/2015   History of bariatric surgery 11/05/2014    Past Surgical History:  Procedure Laterality Date   ABDOMINAL HYSTERECTOMY     APPENDECTOMY     BREAST SURGERY     BREATH TEK H PYLORI N/A 08/27/2014   Procedure: BREATH TEK H PYLORI;  Surgeon:  Valarie Merino, MD;  Location: [facility];  Service: General;  Laterality: N/A;   CATARACT EXTRACTION W/PHACO Left 09/20/2019   Procedure: CATARACT EXTRACTION PHACO AND INTRAOCULAR LENS PLACEMENT (IOC) LEFT 0.74 00:12.3 6.0%;  Surgeon: Lockie Mola, MD;  Location: [facility];  Service: Ophthalmology;  Laterality: Left;   CATARACT EXTRACTION W/PHACO Right 10/11/2019   Procedure: CATARACT EXTRACTION PHACO AND INTRAOCULAR LENS PLACEMENT (IOC)  RIGHT 1.55 00:24.1 6.4%;  Surgeon: Lockie Mola, MD;  Location: [facility];  Service: Ophthalmology;  Laterality: Right;  diabetic   COLONOSCOPY WITH PROPOFOL N/A 02/17/2019   Procedure: COLONOSCOPY WITH PROPOFOL;  Surgeon: Midge Minium, MD;  Location: [facility];  Service: Endoscopy;  Laterality: N/A;   HIATAL HERNIA REPAIR  11/05/2014   Procedure: LAPAROSCOPIC REPAIR OF HIATAL HERNIA;  Surgeon:  Luretha Murphy, MD;  Location: [facility];  Service: General;;   LAPAROSCOPIC GASTRIC SLEEVE RESECTION N/A 11/05/2014   Procedure: LAPAROSCOPIC GASTRIC SLEEVE RESECTION;  Surgeon: Luretha Murphy, MD;  Location: [facility];  Service: General;  Laterality: N/A;   POLYPECTOMY  02/17/2019   Procedure: POLYPECTOMY;  Surgeon: Midge Minium, MD;  Location: [facility];  Service: Endoscopy;;   REDUCTION MAMMAPLASTY Bilateral 1997   UPPER GI ENDOSCOPY  11/05/2014   Procedure: UPPER GI ENDOSCOPY;  Surgeon: Luretha Murphy, MD;  Location: [facility];  Service: General;;    Family History  Problem Relation Age of Onset   Diabetes Paternal Uncle    Stroke Father    Hypertension Other    Breast cancer Maternal Grandmother 78    Social History   Tobacco Use   Smoking status: Never   Smokeless tobacco: Never  Substance Use Topics   Alcohol use: No    Alcohol/week: 0.0 standard drinks of alcohol    Comment: occasional      Current Outpatient Medications:    amLODipine (NORVASC) 10 MG tablet, Take 1 tablet (10 mg total) by mouth  daily., Disp: 90 tablet, Rfl: 0   aspirin EC 81 MG tablet, Take 81 mg by mouth daily., Disp: , Rfl:    Calcium Carb-Cholecalciferol (CALCIUM-VITAMIN D3) 500-400 MG-UNIT TABS, Take 1 tablet by mouth 3 (three) times daily., Disp: , Rfl:    cyanocobalamin 500 MCG tablet, Take 500 mcg by mouth every 3 (three) days. Mon, wed, fri, Disp: , Rfl:    ferrous gluconate ([employer] IRON) 240 (27 FE) MG tablet, Take 1 tablet (240 mg total) by mouth 3 (three) times daily with meals., Disp: 30 tablet, Rfl: 0   fluconazole (DIFLUCAN) 150 MG tablet, Take 1 tablet (150 mg total) by mouth daily., Disp: 1 tablet, Rfl: 0   Fluocinolone Acetonide Body 0.01 % OIL, Apply 1 application topically to damp skin after shower daily, Disp: 118.28 mL, Rfl: 3   FLUoxetine (PROZAC) 10 MG capsule, Take 1 capsule (10 mg total) by mouth daily., Disp: 90 capsule, Rfl: 0   gabapentin (NEURONTIN) 600 MG tablet, Take 1.5 tablets (900 mg total) by mouth at bedtime., Disp: 45 tablet, Rfl: 0   hydrOXYzine (ATARAX) 10 MG tablet, TAKE 1 TABLET BY MOUTH EVERY 8 HOURS AS NEEDED, Disp: 90 tablet, Rfl: 1   losartan (COZAAR) 100 MG tablet, Take 1 tablet (100 mg total) by mouth daily., Disp: 90 tablet, Rfl: 0   Multiple Vitamins-Minerals (MULTIVITAMIN ADULTS PO), Take 1 tablet by mouth daily., Disp: , Rfl:    nystatin (MYCOSTATIN/NYSTOP) powder, Apply 1 Application topically 3 (three) times daily., Disp: 60 g, Rfl: 0   omeprazole (PRILOSEC) 20 MG capsule, Take 1 capsule (20 mg total) by mouth 2 (two) times daily., Disp: 180 capsule, Rfl: 0   ondansetron (ZOFRAN-ODT) 4 MG disintegrating tablet, Take 1 tablet (4 mg total) by mouth every 8 (eight) hours as needed for nausea or vomiting, Disp: 60 tablet, Rfl: 0   OPZELURA 1.5 % CREA, APPLY ONE APPLICATION TOPICALLY DAILY. APPLY TO AFFECTED AREAS FOR ECZEMA AND VITILIGO, Disp: 60 g, Rfl: 3   Rimegepant Sulfate (NURTEC) 75 MG TBDP, Take 1 tablet by mouth daily as needed., Disp: 8 tablet, Rfl: 1   rosuvastatin  (CRESTOR) 10 MG tablet, Take 1 tablet (10 mg total) by mouth daily., Disp: 90 tablet, Rfl: 0   Semaglutide, 1 MG/DOSE, 4 MG/3ML SOPN, Inject 1 mg as directed once a week., Disp: 9 mL, Rfl: 0  zolpidem (AMBIEN) 10 MG tablet, Take 1 tablet (10 mg total) by mouth at bedtime., Disp: 30 tablet, Rfl: 3  No Known Allergies  I personally reviewed active problem list, medication list, allergies, family history, social history, health maintenance with the patient/caregiver today.   ROS  Ten systems reviewed and is negative except as mentioned in HPI    Objective  Vitals:   06/02/23 0746  BP: 124/82  Pulse: 82  Resp: 16  Weight: 134 lb (60.8 kg)  Height: 5\' 3"  (1.6 m)    Body mass index is 23.74 kg/m.  Physical Exam  Constitutional: Patient appears well-developed and well-nourished. Obese  No distress.  HEENT: head atraumatic, normocephalic, pupils equal and reactive to light, neck supple Cardiovascular: Normal rate, regular rhythm and normal heart sounds.  No murmur heard. No BLE edema. Pulmonary/Chest: Effort normal and breath sounds normal. No respiratory distress. Abdominal: Soft.  There is no tenderness. Psychiatric: Patient has a normal mood and affect. behavior is normal. Judgment and thought content normal.   Recent Results (from the past 2160 hour(s))  HM DIABETES EYE EXAM     Status: None   Collection Time: 04/29/23  8:51 AM  Result Value Ref Range   HM Diabetic Eye Exam No Retinopathy No Retinopathy    Comment: Abstracted by HIM    PHQ2/9:    06/02/2023    7:47 AM 11/12/2022    8:20 AM 08/05/2022    9:57 AM 04/21/2022   11:42 AM 10/28/2021    1:08 PM  Depression screen PHQ 2/9  Decreased Interest 0 1 0 0 0  Down, Depressed, Hopeless 0 0 0 0 0  PHQ - 2 Score 0 1 0 0 0  Altered sleeping 0 3 0 3 0  Tired, decreased energy 0 1 0 3 0  Change in appetite 0 0 0 0 0  Feeling bad or failure about yourself  0 0 0 0 0  Trouble concentrating 0 0 0 0 0  Moving slowly or  fidgety/restless 0 0 0 0 0  Suicidal thoughts 0 0 0 0 0  PHQ-9 Score 0 5 0 6 0    phq 9 is negative   Fall Risk:    06/02/2023    7:46 AM 11/12/2022    8:20 AM 08/05/2022    9:57 AM 04/21/2022   11:42 AM 10/28/2021    1:08 PM  Fall Risk   Falls in the past year? 0 0 0 0 1  Number falls in past yr: 0 0  0 0  Injury with Fall? 0 0  0 0  Risk for fall due to : No Fall Risks No Fall Risks No Fall Risks No Fall Risks No Fall Risks  Follow up Falls prevention discussed Falls prevention discussed Falls prevention discussed;Education provided;Falls evaluation completed Falls prevention discussed Falls prevention discussed      Functional Status Survey: Is the patient deaf or have difficulty hearing?: No Does the patient have difficulty seeing, even when wearing glasses/contacts?: No Does the patient have difficulty concentrating, remembering, or making decisions?: No Does the patient have difficulty walking or climbing stairs?: No Does the patient have difficulty dressing or bathing?: No Does the patient have difficulty doing errands alone such as visiting a doctor's office or shopping?: No    Assessment & Plan  1. Type 2 diabetes with nephropathy (HCC)  - POCT HgB A1C - COMPLETE METABOLIC PANEL WITH GFR - Lipid panel - Urine Microalbumin w/creat. ratio - Semaglutide, 1 MG/DOSE,  4 MG/3ML SOPN; Inject 1 mg as directed once a week.  Dispense: 9 mL; Refill: 1 - nystatin (MYCOSTATIN/NYSTOP) powder; Apply 1 Application topically 3 (three) times daily.  Dispense: 180 g; Refill: 1 - losartan (COZAAR) 100 MG tablet; Take 1 tablet (100 mg total) by mouth daily.  Dispense: 90 tablet; Refill: 1  2. Mild recurrent major depression (HCC)  - FLUoxetine (PROZAC) 10 MG capsule; Take 1 capsule (10 mg total) by mouth daily.  Dispense: 90 capsule; Refill: 1  3. Hypertension, benign  - CBC with Differential/Platelet - amLODipine (NORVASC) 10 MG tablet; Take 1 tablet (10 mg total) by mouth daily.   Dispense: 90 tablet; Refill: 1  4. History of bariatric surgery  - Iron, TIBC and Ferritin Panel - Zinc  5. Vitamin D deficiency  - VITAMIN D 25 Hydroxy (Vit-D Deficiency, Fractures)  6. B12 deficiency  - B12 and Folate Panel  7. GERD without esophagitis  On PPI   8. Migraine without aura and without status migrainosus, not intractable  - ondansetron (ZOFRAN-ODT) 4 MG disintegrating tablet; Take 1 tablet (4 mg total) by mouth every 8 (eight) hours as needed for nausea or vomiting  Dispense: 60 tablet; Refill: 0 - Rimegepant Sulfate (NURTEC) 75 MG TBDP; Take 1 tablet (75 mg total) by mouth daily as needed.  Dispense: 9 tablet; Refill: 1  9. Chronic insomnia  - zolpidem (AMBIEN) 10 MG tablet; Take 1 tablet (10 mg total) by mouth at bedtime.  Dispense: 90 tablet; Refill: 1  10. History of TIA (transient ischemic attack)  - rosuvastatin (CRESTOR) 10 MG tablet; Take 1 tablet (10 mg total) by mouth daily.  Dispense: 90 tablet; Refill: 1  11. Peripheral polyneuropathy  - gabapentin (NEURONTIN) 600 MG tablet; Take 1.5 tablets (900 mg total) by mouth at bedtime.  Dispense: 135 tablet; Refill: 1  12. Chronic idiopathic constipation  - Ambulatory referral to Gastroenterology

## 2023-06-02 ENCOUNTER — Encounter: Payer: Self-pay | Admitting: Family Medicine

## 2023-06-02 ENCOUNTER — Other Ambulatory Visit: Payer: Self-pay

## 2023-06-02 ENCOUNTER — Ambulatory Visit (INDEPENDENT_AMBULATORY_CARE_PROVIDER_SITE_OTHER): Payer: 59 | Admitting: Family Medicine

## 2023-06-02 VITALS — BP 124/82 | HR 82 | Resp 16 | Ht 63.0 in | Wt 134.0 lb

## 2023-06-02 DIAGNOSIS — E559 Vitamin D deficiency, unspecified: Secondary | ICD-10-CM

## 2023-06-02 DIAGNOSIS — I10 Essential (primary) hypertension: Secondary | ICD-10-CM | POA: Diagnosis not present

## 2023-06-02 DIAGNOSIS — E1121 Type 2 diabetes mellitus with diabetic nephropathy: Secondary | ICD-10-CM

## 2023-06-02 DIAGNOSIS — G43009 Migraine without aura, not intractable, without status migrainosus: Secondary | ICD-10-CM | POA: Diagnosis not present

## 2023-06-02 DIAGNOSIS — F5104 Psychophysiologic insomnia: Secondary | ICD-10-CM

## 2023-06-02 DIAGNOSIS — G629 Polyneuropathy, unspecified: Secondary | ICD-10-CM

## 2023-06-02 DIAGNOSIS — K219 Gastro-esophageal reflux disease without esophagitis: Secondary | ICD-10-CM | POA: Diagnosis not present

## 2023-06-02 DIAGNOSIS — E538 Deficiency of other specified B group vitamins: Secondary | ICD-10-CM | POA: Diagnosis not present

## 2023-06-02 DIAGNOSIS — K5904 Chronic idiopathic constipation: Secondary | ICD-10-CM

## 2023-06-02 DIAGNOSIS — Z8673 Personal history of transient ischemic attack (TIA), and cerebral infarction without residual deficits: Secondary | ICD-10-CM

## 2023-06-02 DIAGNOSIS — F330 Major depressive disorder, recurrent, mild: Secondary | ICD-10-CM

## 2023-06-02 DIAGNOSIS — Z9884 Bariatric surgery status: Secondary | ICD-10-CM | POA: Diagnosis not present

## 2023-06-02 LAB — POCT GLYCOSYLATED HEMOGLOBIN (HGB A1C): Hemoglobin A1C: 5.4 % (ref 4.0–5.6)

## 2023-06-02 MED ORDER — NYSTATIN 100000 UNIT/GM EX POWD
1.0000 | Freq: Three times a day (TID) | CUTANEOUS | 1 refills | Status: AC
  Filled 2023-06-02: qty 180, 90d supply, fill #0

## 2023-06-02 MED ORDER — NURTEC 75 MG PO TBDP
1.0000 | ORAL_TABLET | Freq: Every day | ORAL | 1 refills | Status: DC | PRN
  Filled 2023-06-02: qty 9, 9d supply, fill #0

## 2023-06-02 MED ORDER — ZOLPIDEM TARTRATE 10 MG PO TABS
10.0000 mg | ORAL_TABLET | Freq: Every day | ORAL | 1 refills | Status: DC
  Filled 2023-06-02 – 2023-06-23 (×2): qty 90, 90d supply, fill #0
  Filled 2023-09-22: qty 90, 90d supply, fill #1

## 2023-06-02 MED ORDER — ONDANSETRON 4 MG PO TBDP
4.0000 mg | ORAL_TABLET | Freq: Three times a day (TID) | ORAL | 0 refills | Status: DC | PRN
  Filled 2023-06-02: qty 60, 20d supply, fill #0

## 2023-06-02 MED ORDER — GABAPENTIN 600 MG PO TABS
900.0000 mg | ORAL_TABLET | Freq: Every day | ORAL | 1 refills | Status: DC
  Filled 2023-06-02 – 2023-06-23 (×2): qty 135, 90d supply, fill #0
  Filled 2023-09-22: qty 135, 90d supply, fill #1

## 2023-06-02 MED ORDER — SEMAGLUTIDE (1 MG/DOSE) 4 MG/3ML ~~LOC~~ SOPN
1.0000 mg | PEN_INJECTOR | SUBCUTANEOUS | 1 refills | Status: DC
  Filled 2023-06-02: qty 9, 84d supply, fill #0
  Filled 2023-08-27: qty 9, 84d supply, fill #1

## 2023-06-02 MED ORDER — FLUOXETINE HCL 10 MG PO CAPS
10.0000 mg | ORAL_CAPSULE | Freq: Every day | ORAL | 1 refills | Status: DC
  Filled 2023-06-02 – 2023-07-19 (×2): qty 90, 90d supply, fill #0
  Filled 2023-11-22: qty 90, 90d supply, fill #1

## 2023-06-02 MED ORDER — LOSARTAN POTASSIUM 100 MG PO TABS
100.0000 mg | ORAL_TABLET | Freq: Every day | ORAL | 1 refills | Status: DC
  Filled 2023-06-02 – 2023-07-19 (×2): qty 90, 90d supply, fill #0
  Filled 2023-11-22: qty 90, 90d supply, fill #1

## 2023-06-02 MED ORDER — AMLODIPINE BESYLATE 10 MG PO TABS
10.0000 mg | ORAL_TABLET | Freq: Every day | ORAL | 1 refills | Status: DC
  Filled 2023-06-02: qty 90, 90d supply, fill #0
  Filled 2023-08-27: qty 90, 90d supply, fill #1

## 2023-06-02 MED ORDER — ROSUVASTATIN CALCIUM 10 MG PO TABS
10.0000 mg | ORAL_TABLET | Freq: Every day | ORAL | 1 refills | Status: DC
  Filled 2023-06-02: qty 90, 90d supply, fill #0
  Filled 2023-08-27: qty 90, 90d supply, fill #1

## 2023-06-03 ENCOUNTER — Other Ambulatory Visit: Payer: Self-pay

## 2023-06-09 LAB — IRON,TIBC AND FERRITIN PANEL
%SAT: 19 % (ref 16–45)
Ferritin: 62 ng/mL (ref 16–232)
Iron: 68 ug/dL (ref 45–160)
TIBC: 361 ug/dL (ref 250–450)

## 2023-06-09 LAB — CBC WITH DIFFERENTIAL/PLATELET
Absolute Lymphocytes: 2457 {cells}/uL (ref 850–3900)
Absolute Monocytes: 498 {cells}/uL (ref 200–950)
Basophils Absolute: 50 {cells}/uL (ref 0–200)
Basophils Relative: 0.8 %
Eosinophils Absolute: 189 {cells}/uL (ref 15–500)
Eosinophils Relative: 3 %
HCT: 39.7 % (ref 35.0–45.0)
Hemoglobin: 12.1 g/dL (ref 11.7–15.5)
MCH: 25.6 pg — ABNORMAL LOW (ref 27.0–33.0)
MCHC: 30.5 g/dL — ABNORMAL LOW (ref 32.0–36.0)
MCV: 83.9 fL (ref 80.0–100.0)
MPV: 10 fL (ref 7.5–12.5)
Monocytes Relative: 7.9 %
Neutro Abs: 3106 {cells}/uL (ref 1500–7800)
Neutrophils Relative %: 49.3 %
Platelets: 353 10*3/uL (ref 140–400)
RBC: 4.73 10*6/uL (ref 3.80–5.10)
RDW: 13.2 % (ref 11.0–15.0)
Total Lymphocyte: 39 %
WBC: 6.3 10*3/uL (ref 3.8–10.8)

## 2023-06-09 LAB — COMPLETE METABOLIC PANEL WITH GFR
AG Ratio: 1.9 (calc) (ref 1.0–2.5)
ALT: 24 U/L (ref 6–29)
AST: 19 U/L (ref 10–35)
Albumin: 4.4 g/dL (ref 3.6–5.1)
Alkaline phosphatase (APISO): 85 U/L (ref 37–153)
BUN: 19 mg/dL (ref 7–25)
CO2: 32 mmol/L (ref 20–32)
Calcium: 9.7 mg/dL (ref 8.6–10.4)
Chloride: 104 mmol/L (ref 98–110)
Creat: 0.75 mg/dL (ref 0.50–1.03)
Globulin: 2.3 g/dL (ref 1.9–3.7)
Glucose, Bld: 84 mg/dL (ref 65–99)
Potassium: 4.2 mmol/L (ref 3.5–5.3)
Sodium: 141 mmol/L (ref 135–146)
Total Bilirubin: 0.3 mg/dL (ref 0.2–1.2)
Total Protein: 6.7 g/dL (ref 6.1–8.1)
eGFR: 95 mL/min/{1.73_m2} (ref >=60)

## 2023-06-09 LAB — MICROALBUMIN / CREATININE URINE RATIO
Creatinine, Urine: 205 mg/dL (ref 20–275)
Microalb Creat Ratio: 4 mg/g{creat} (ref <30)
Microalb, Ur: 0.9 mg/dL

## 2023-06-09 LAB — VITAMIN D 25 HYDROXY (VIT D DEFICIENCY, FRACTURES): Vit D, 25-Hydroxy: 34 ng/mL (ref 30–100)

## 2023-06-09 LAB — ZINC: Zinc: 57 ug/dL — ABNORMAL LOW (ref 60–130)

## 2023-06-09 LAB — LIPID PANEL
Cholesterol: 133 mg/dL (ref <200)
HDL: 63 mg/dL (ref >=50)
LDL Cholesterol (Calc): 56 mg/dL
Non-HDL Cholesterol (Calc): 70 mg/dL (ref <130)
Total CHOL/HDL Ratio: 2.1 (calc) (ref <5.0)
Triglycerides: 61 mg/dL (ref <150)

## 2023-06-09 LAB — B12 AND FOLATE PANEL
Folate: 17.7 ng/mL
Vitamin B-12: 381 pg/mL (ref 200–1100)

## 2023-06-23 ENCOUNTER — Other Ambulatory Visit: Payer: Self-pay

## 2023-06-25 DIAGNOSIS — Z419 Encounter for procedure for purposes other than remedying health state, unspecified: Secondary | ICD-10-CM | POA: Diagnosis not present

## 2023-07-15 ENCOUNTER — Telehealth: Payer: Self-pay

## 2023-07-15 NOTE — Telephone Encounter (Signed)
The patient called and left a voicemail for me returning my call. I called the patient back no one answer I left a voicemail.

## 2023-07-19 ENCOUNTER — Other Ambulatory Visit: Payer: Self-pay

## 2023-07-25 DIAGNOSIS — Z419 Encounter for procedure for purposes other than remedying health state, unspecified: Secondary | ICD-10-CM | POA: Diagnosis not present

## 2023-08-03 ENCOUNTER — Other Ambulatory Visit: Payer: Self-pay

## 2023-08-03 MED ORDER — COMIRNATY 30 MCG/0.3ML IM SUSY
PREFILLED_SYRINGE | INTRAMUSCULAR | 0 refills | Status: DC
  Filled 2023-08-03: qty 0.3, 1d supply, fill #0

## 2023-08-29 ENCOUNTER — Other Ambulatory Visit: Payer: Self-pay

## 2023-09-07 ENCOUNTER — Ambulatory Visit (INDEPENDENT_AMBULATORY_CARE_PROVIDER_SITE_OTHER): Payer: Commercial Managed Care - PPO | Admitting: Physician Assistant

## 2023-09-07 ENCOUNTER — Encounter: Payer: Self-pay | Admitting: Physician Assistant

## 2023-09-07 VITALS — BP 109/63 | HR 76 | Temp 98.1°F | Ht 63.0 in | Wt 140.4 lb

## 2023-09-07 DIAGNOSIS — K5904 Chronic idiopathic constipation: Secondary | ICD-10-CM

## 2023-09-07 DIAGNOSIS — K581 Irritable bowel syndrome with constipation: Secondary | ICD-10-CM | POA: Diagnosis not present

## 2023-09-07 NOTE — Progress Notes (Signed)
 Shannon Console, PA-C [address]  [address]  [place], [place] [address]  Main: [phone]  Fax: [phone]   Gastroenterology Consultation  Referring Provider:     Glenard Mire, MD Primary Care Physician:  Glenard Mire, MD Primary Gastroenterologist:  Shannon Console, PA-C  Reason for Consultation:     Chronic constipation        HPI:   Shannon Soto is a 53 y.o. y/o female referred for consultation & management  by Sowles, Krichna, MD.  She is here to evaluate chronic constipation for over 30 years.  Is currently on Ozempic .  No current treatment for constipation.  Patient states she has had chronic lifelong constipation since her 62s.  In the past she took Amitiza  and Linzess  which caused diarrhea and she discontinued.  Currently she is taking Colace 50 mg plus senna 8.6 mg, 5 tablets once daily every Friday.  She does this treatment once per week and has a bowel movement once per week.  She has tried MiraLAX  in the past.  She also tried taking docusate plus senna once or twice daily which did not work well.  Also tried fiber supplements and probiotic Gummies with little benefit.  He is afraid of having diarrhea which will affect her job and work.  Has occasional chronic abdominal cramping, bloating, swelling, gas, nausea.  History of IBS-C.  History of gastric sleeve surgery and hiatal hernia repair in 2016.  Has difficulty swallowing a lot of liquids.  01/2019 Screening colonoscopy by Dr. Jinny with Suprep: 3 mm hyperplastic polyp removed from sigmoid colon.  Excellent prep.  Nonbleeding grade 2 internal hemorrhoids.  10-year repeat.    Past Medical History:  Diagnosis Date   Diabetes mellitus without complication (HCC)    improved after gastric sleeve   GERD (gastroesophageal reflux disease)    Hyperlipidemia    Hypertension    Migraine headache    2x/mo   Orthodontics    braces   Raynaud's syndrome     Past Surgical History:  Procedure Laterality Date    ABDOMINAL HYSTERECTOMY     APPENDECTOMY     BREAST SURGERY     BREATH TEK H PYLORI N/A 08/27/2014   Procedure: BREATH TEK H PYLORI;  Surgeon: Donnice KATHEE Lunger, MD;  Location: [facility];  Service: General;  Laterality: N/A;   CATARACT EXTRACTION W/PHACO Left 09/20/2019   Procedure: CATARACT EXTRACTION PHACO AND INTRAOCULAR LENS PLACEMENT (IOC) LEFT 0.74 00:12.3 6.0%;  Surgeon: Mittie Gaskin, MD;  Location: [facility];  Service: Ophthalmology;  Laterality: Left;   CATARACT EXTRACTION W/PHACO Right 10/11/2019   Procedure: CATARACT EXTRACTION PHACO AND INTRAOCULAR LENS PLACEMENT (IOC)  RIGHT 1.55 00:24.1 6.4%;  Surgeon: Mittie Gaskin, MD;  Location: [facility];  Service: Ophthalmology;  Laterality: Right;  diabetic   COLONOSCOPY WITH PROPOFOL  N/A 02/17/2019   Procedure: COLONOSCOPY WITH PROPOFOL ;  Surgeon: Jinny Carmine, MD;  Location: [facility];  Service: Endoscopy;  Laterality: N/A;   HIATAL HERNIA REPAIR  11/05/2014   Procedure: LAPAROSCOPIC REPAIR OF HIATAL HERNIA;  Surgeon: Donnice Lunger, MD;  Location: [facility];  Service: General;;   LAPAROSCOPIC GASTRIC SLEEVE RESECTION N/A 11/05/2014   Procedure: LAPAROSCOPIC GASTRIC SLEEVE RESECTION;  Surgeon: Donnice Lunger, MD;  Location: [facility];  Service: General;  Laterality: N/A;   POLYPECTOMY  02/17/2019   Procedure: POLYPECTOMY;  Surgeon: Jinny Carmine, MD;  Location: [facility];  Service: Endoscopy;;   REDUCTION MAMMAPLASTY Bilateral 1997   UPPER GI ENDOSCOPY  11/05/2014   Procedure: UPPER GI ENDOSCOPY;  Surgeon: Donnice Lunger, MD;  Location: [facility];  Service: General;;    Prior to Admission medications   Medication Sig Start Date End Date Taking? Authorizing Provider  amLODipine  (NORVASC ) 10 MG tablet Take 1 tablet (10 mg total) by mouth daily. 06/02/23   Sowles, Krichna, MD  aspirin  EC 81 MG tablet Take 81 mg by mouth daily. 05/31/20   [provider]  Calcium  Carb-Cholecalciferol   (CALCIUM -VITAMIN D3) 500-400 MG-UNIT TABS Take 1 tablet by mouth 3 (three) times daily.    [provider]  COVID-19 mRNA vaccine, Pfizer, (COMIRNATY ) syringe Inject into the muscle. 08/03/23   Luiz Channel, MD  cyanocobalamin  500 MCG tablet Take 500 mcg by mouth every 3 (three) days. Mon, wed, fri    [provider]  ferrous gluconate  (CVS IRON ) 240 (27 FE) MG tablet Take 1 tablet (240 mg total) by mouth 3 (three) times daily with meals. 02/28/15   Sowles, Krichna, MD  Fluocinolone  Acetonide Body 0.01 % OIL Apply 1 application topically to damp skin after shower daily 11/19/20   Jackquline Sawyer, MD  FLUoxetine  (PROZAC ) 10 MG capsule Take 1 capsule (10 mg total) by mouth daily. 06/02/23 06/01/24  Sowles, Krichna, MD  gabapentin  (NEURONTIN ) 600 MG tablet Take 1.5 tablets (900 mg total) by mouth at bedtime. 06/02/23   Sowles, Krichna, MD  losartan  (COZAAR ) 100 MG tablet Take 1 tablet (100 mg total) by mouth daily. 06/02/23 06/01/24  Sowles, Krichna, MD  Multiple Vitamins-Minerals (MULTIVITAMIN ADULTS PO) Take 1 tablet by mouth daily. 08/22/19   [provider]  nystatin  (MYCOSTATIN /NYSTOP ) powder Apply 1 Application topically 3 (three) times daily. 06/02/23   Sowles, Krichna, MD  omeprazole  (PRILOSEC) 20 MG capsule Take 1 capsule (20 mg total) by mouth 2 (two) times daily. 02/16/23   Sowles, Krichna, MD  ondansetron  (ZOFRAN -ODT) 4 MG disintegrating tablet Take 1 tablet (4 mg total) by mouth every 8 (eight) hours as needed for nausea or vomiting 06/02/23   Sowles, Krichna, MD  OPZELURA  1.5 % CREA APPLY ONE APPLICATION TOPICALLY DAILY. APPLY TO AFFECTED AREAS FOR ECZEMA AND VITILIGO 12/22/21   Jackquline Sawyer, MD  Rimegepant Sulfate  (NURTEC) 75 MG TBDP Take 1 tablet (75 mg total) by mouth daily as needed. 06/02/23   Sowles, Krichna, MD  rosuvastatin  (CRESTOR ) 10 MG tablet Take 1 tablet (10 mg total) by mouth daily. 06/02/23   Sowles, Krichna, MD  Semaglutide , 1 MG/DOSE, 4 MG/3ML SOPN Inject 1  mg as directed once a week. 06/02/23   Sowles, Krichna, MD  zolpidem  (AMBIEN ) 10 MG tablet Take 1 tablet (10 mg total) by mouth at bedtime. 06/02/23   Sowles, Krichna, MD    Family History  Problem Relation Age of Onset   Diabetes Paternal Uncle    Stroke Father    Hypertension Other    Breast cancer Maternal Grandmother 67     Social History   Tobacco Use   Smoking status: Never   Smokeless tobacco: Never  Vaping Use   Vaping status: Never Used  Substance Use Topics   Alcohol use: No    Alcohol/week: 0.0 standard drinks of alcohol    Comment: occasional    Drug use: No    Allergies as of 09/07/2023   (No Known Allergies)    Review of Systems:    All systems reviewed and negative except where noted in HPI.   Physical Exam:  BP 109/63   Pulse 76   Temp 98.1 F (  36.7 C)   Ht 5' 3 (1.6 m)   Wt 140 lb 6.4 oz (63.7 kg)   BMI 24.87 kg/m  No LMP recorded. Patient has had a hysterectomy.  General:   Alert,  Well-developed, well-nourished, pleasant and cooperative in NAD Lungs:  Respirations even and unlabored.  Clear throughout to auscultation.   No wheezes, crackles, or rhonchi. No acute distress. Heart:  Regular rate and rhythm; no murmurs, clicks, rubs, or gallops. Abdomen:  Normal bowel sounds.  No bruits.  Soft, and non-distended without masses, hepatosplenomegaly or hernias noted.  No Tenderness.  No guarding or rebound tenderness.    Neurologic:  Alert and oriented x3;  grossly normal neurologically. Psych:  Alert and cooperative. Normal mood and affect.  Imaging Studies: No results found.  Assessment and Plan:   JACARI KIRSTEN is a 53 y.o. y/o female has been referred for   1.  Chronic idiopathic constipation / Irritable bowel syndrome with constipation  Gave samples of Linzess  72 mcg QD for 1 week.  She will let me know if this works and she can call me back for a prescription.   If Linzess  does not work, then try [employer], then Ibsrela.  Recommend she  start Benefiber powder mix 1 or 2 tablespoons in a drink daily.  Recommend high-fiber diet, 30 g of fiber daily with fruits, vegetables, whole grains.  Drink 64 ounces of water  daily.  2.  Colon cancer screening - Is up-to-date.    10-year repeat colonoscopy will be due 01/2029.  Follow up 4 weeks with TG.  Shannon Console, PA-C

## 2023-09-23 ENCOUNTER — Other Ambulatory Visit: Payer: Self-pay

## 2023-10-04 ENCOUNTER — Ambulatory Visit (INDEPENDENT_AMBULATORY_CARE_PROVIDER_SITE_OTHER): Payer: Commercial Managed Care - PPO | Admitting: Family Medicine

## 2023-10-04 ENCOUNTER — Other Ambulatory Visit: Payer: Self-pay

## 2023-10-04 VITALS — BP 112/74 | HR 79 | Resp 16 | Ht 63.0 in | Wt 140.1 lb

## 2023-10-04 DIAGNOSIS — F5104 Psychophysiologic insomnia: Secondary | ICD-10-CM

## 2023-10-04 DIAGNOSIS — G43009 Migraine without aura, not intractable, without status migrainosus: Secondary | ICD-10-CM | POA: Diagnosis not present

## 2023-10-04 DIAGNOSIS — I10 Essential (primary) hypertension: Secondary | ICD-10-CM | POA: Diagnosis not present

## 2023-10-04 DIAGNOSIS — K5904 Chronic idiopathic constipation: Secondary | ICD-10-CM | POA: Diagnosis not present

## 2023-10-04 DIAGNOSIS — Z9884 Bariatric surgery status: Secondary | ICD-10-CM

## 2023-10-04 DIAGNOSIS — K219 Gastro-esophageal reflux disease without esophagitis: Secondary | ICD-10-CM | POA: Diagnosis not present

## 2023-10-04 DIAGNOSIS — E1121 Type 2 diabetes mellitus with diabetic nephropathy: Secondary | ICD-10-CM | POA: Diagnosis not present

## 2023-10-04 DIAGNOSIS — Z1231 Encounter for screening mammogram for malignant neoplasm of breast: Secondary | ICD-10-CM

## 2023-10-04 DIAGNOSIS — F330 Major depressive disorder, recurrent, mild: Secondary | ICD-10-CM

## 2023-10-04 LAB — POCT GLYCOSYLATED HEMOGLOBIN (HGB A1C): Hemoglobin A1C: 5.4 % (ref 4.0–5.6)

## 2023-10-04 MED ORDER — NURTEC 75 MG PO TBDP
1.0000 | ORAL_TABLET | Freq: Every day | ORAL | 1 refills | Status: DC | PRN
  Filled 2023-10-04: qty 9, 30d supply, fill #0
  Filled 2023-10-12: qty 8, 15d supply, fill #0
  Filled 2023-10-12: qty 9, 15d supply, fill #0

## 2023-10-04 NOTE — Progress Notes (Signed)
 Name: Shannon Soto   MRN: [id number]    DOB: 12/27/1970   Date:10/04/2023       Progress Note  Subjective  Chief Complaint  Chief Complaint  Patient presents with   Medical Management of Chronic Issues   HPI  History of TIA: admitted back in August 2021 with left middle finger pain, intense, seen by neurologist, questionable TIA versus severe Raynaud's , symptoms lasted days but resolved, now symptoms still happens but associated with change in color and likely from Raynaud's.   She has seen Neurologist, Rheumatologist and Cardiologist,  Dr. Carl Charnley and will follow up yearly. She had another visit to [facility] on 03/2022 with  left side tingling and numbness. This time, CT neck, brain and MRI brain all within normal limits. Echo showed normal EF and only mild tricuspid regurgitation. She has not seen a neurologist since the initial TIA but she is up to date with visits with cardiologist    Insomnia: she has chronic insomnia, she takes Ambien  and denies side effects . She still has interrupt sleep, able to sleep 4 hours with medication, but unable to sleep without the medication. She has tried other medications but does not last either. She also takes hydroxyzine  prn to help with sleep . Unchanged    DMII with peripheral neuropathy and nephropathy:  off metformin since bariatric surgery in 10/2014 , no polyphagia, but has episodes of polydipsia but no polyuria but still has nocturia  She takes  Gabapentin  for neuropathy. She is back on Losartan  ,due to increase in urine micro but last level was within normal limits. Her A1C has been within normal for a long time , she has been tolerating Ozempic  currently at 1 mg weekly , no side effects. We will recheck A1C today    Bariatric Surgery: she had sleeve surgery done by Dr. Gaylyn Keas on March 14th, 2016, Weight prior to surgery was 242 lbs She is doing well, hgbA1C started to go up, and we started her on Ozempic  02/2017 weight was in the 140 range until Fall  2020 when weight went up to 150 lbs  since Feb 2021 she lost down to 137 lbs by increasing her walks, weight was stable however when admitted to [facility] for possible TIA in August 2021  she lost her appetite and weight dropped to 131 lbs. Weight is up a little at 140 lbs   Eczema: she is using topical Opzelura  prn eczema and also some of the vitiligo spots and seems to be helping She still has some spots on antecubital area and anterior chest.    Vitiligo: seen by Dr. Annette Barters, using a new topical medication but some of the spots are getting darker, but down to Opzelura  once a day instead of BID and seems to be responding to therapy    GERD: she has a long history of reflux, symptoms were controlled with Omeprazole  20 mg  BID, discussed long term use of PPI. She tried to skip doses but symptoms returns, unchanged    Chronic Idiopathic constipation: she has a long history of constipation, unable to tolerate Miralax , took Linzess  in the past but it caused burning in her stomach and explosive diarrhea multiple times a day. She saw Dr. Baldomero Bone and is back on Linzess  but currently on 72 mg dose , samples. Tolerating it well at this time   Hyperlipidemia: Reviewed last labs and LDL was at goal at 56 continue Rosuvastatin     HTN: she is on Norvasc  for Raynaud's and  Losartan  for microalbuminuria  No chest pain or  dizziness. She states she only has palpitation when stressed . BP is at goal, denies orthostatic hypotension   Vitamin D  deficiency: she has been taking supplements daily    B12 deficiency: taking only three times a week and we will recheck levels    History of iron  deficiency anemia: still taking iron  , ferritin level is gradually trending down.    Major depression: chronic symptoms since her son was born. She has been taking Fluoxetine , one daily, occasionally takes one extra pill during the day, discussed taking hydroxizine prn for anxiety . No side effects. She is stable  Migraines: she has  tried multiple medications, but could not tolerate, episodes about 1-2 times months. Episodes are described as  pain is behind her eyes, throbbing like, associated with nausea when she has a severe episode and photophobia. It can last up to 4 hours with medication ( Tylenol   ) she takes Goodypowders prn , she states never got Nurtec because it was not covered by Medicaid, we will send another rx today    Raynaud's: symptoms of  fingers on both hands gets white , painful and numb, not sure of the triggers at this time.  Worse in the cold weather . Stable   Patient Active Problem List   Diagnosis Date Noted   B12 deficiency 11/12/2022   Mild recurrent major depression (HCC) 11/12/2022   Hypertension, benign 11/12/2022   Type 2 diabetes with nephropathy (HCC) 11/12/2022   Headache disorder 09/10/2020   TIA (transient ischemic attack) 05/06/2020   Numbness and tingling in left hand 04/16/2020   Encounter for screening colonoscopy    Polyp of sigmoid colon    Controlled type 2 diabetes mellitus with neuropathy (HCC) 05/12/2016   Raynaud's disease without gangrene 05/12/2016   Intertrigo 09/06/2015   Migraine headache without aura 02/28/2015   Essential hypertension 02/28/2015   History of obesity 02/28/2015   Hyperlipidemia 02/28/2015   GERD (gastroesophageal reflux disease) 02/28/2015   Vitamin D  deficiency 02/28/2015   Chronic constipation 02/28/2015   Chronic insomnia 02/28/2015   Peripheral neuropathy 02/28/2015   Vitiligo 02/28/2015   Lower leg mass 02/28/2015   Depression with anxiety 02/28/2015   History of bariatric surgery 11/05/2014    Past Surgical History:  Procedure Laterality Date   ABDOMINAL HYSTERECTOMY     APPENDECTOMY     BREAST SURGERY     BREATH TEK H PYLORI N/A 08/27/2014   Procedure: BREATH TEK H PYLORI;  Surgeon: Azucena Bollard, MD;  Location: [facility];  Service: General;  Laterality: N/A;   CATARACT EXTRACTION W/PHACO Left 09/20/2019   Procedure:  CATARACT EXTRACTION PHACO AND INTRAOCULAR LENS PLACEMENT (IOC) LEFT 0.74 00:12.3 6.0%;  Surgeon: Annell Kidney, MD;  Location: [facility];  Service: Ophthalmology;  Laterality: Left;   CATARACT EXTRACTION W/PHACO Right 10/11/2019   Procedure: CATARACT EXTRACTION PHACO AND INTRAOCULAR LENS PLACEMENT (IOC)  RIGHT 1.55 00:24.1 6.4%;  Surgeon: Annell Kidney, MD;  Location: [facility];  Service: Ophthalmology;  Laterality: Right;  diabetic   COLONOSCOPY WITH PROPOFOL  N/A 02/17/2019   Procedure: COLONOSCOPY WITH PROPOFOL ;  Surgeon: Marnee Sink, MD;  Location: [facility];  Service: Endoscopy;  Laterality: N/A;   HIATAL HERNIA REPAIR  11/05/2014   Procedure: LAPAROSCOPIC REPAIR OF HIATAL HERNIA;  Surgeon: Jacolyn Matar, MD;  Location: [facility];  Service: General;;   LAPAROSCOPIC GASTRIC SLEEVE RESECTION N/A 11/05/2014   Procedure: LAPAROSCOPIC GASTRIC SLEEVE RESECTION;  Surgeon: Zoila Hines  Gaylyn Keas, MD;  Location: [facility];  Service: General;  Laterality: N/A;   POLYPECTOMY  02/17/2019   Procedure: POLYPECTOMY;  Surgeon: Marnee Sink, MD;  Location: [facility];  Service: Endoscopy;;   REDUCTION MAMMAPLASTY Bilateral 1997   UPPER GI ENDOSCOPY  11/05/2014   Procedure: UPPER GI ENDOSCOPY;  Surgeon: Jacolyn Matar, MD;  Location: [facility];  Service: General;;    Family History  Problem Relation Age of Onset   Diabetes Paternal Uncle    Stroke Father    Hypertension Other    Breast cancer Maternal Grandmother 47    Social History   Tobacco Use   Smoking status: Never   Smokeless tobacco: Never  Substance Use Topics   Alcohol use: No    Alcohol/week: 0.0 standard drinks of alcohol    Comment: occasional      Current Outpatient Medications:    amLODipine  (NORVASC ) 10 MG tablet, Take 1 tablet (10 mg total) by mouth daily., Disp: 90 tablet, Rfl: 1   aspirin  EC 81 MG tablet, Take 81 mg by mouth daily., Disp: , Rfl:    Calcium  Carb-Cholecalciferol   (CALCIUM -VITAMIN D3) 500-400 MG-UNIT TABS, Take 1 tablet by mouth 3 (three) times daily., Disp: , Rfl:    cyanocobalamin  500 MCG tablet, Take 500 mcg by mouth every 3 (three) days. Mon, wed, fri, Disp: , Rfl:    ferrous gluconate  ([employer] IRON ) 240 (27 FE) MG tablet, Take 1 tablet (240 mg total) by mouth 3 (three) times daily with meals., Disp: 30 tablet, Rfl: 0   FLUoxetine  (PROZAC ) 10 MG capsule, Take 1 capsule (10 mg total) by mouth daily., Disp: 90 capsule, Rfl: 1   gabapentin  (NEURONTIN ) 600 MG tablet, Take 1.5 tablets (900 mg total) by mouth at bedtime., Disp: 135 tablet, Rfl: 1   losartan  (COZAAR ) 100 MG tablet, Take 1 tablet (100 mg total) by mouth daily., Disp: 90 tablet, Rfl: 1   Multiple Vitamins-Minerals (MULTIVITAMIN ADULTS PO), Take 1 tablet by mouth daily., Disp: , Rfl:    nystatin  (MYCOSTATIN /NYSTOP ) powder, Apply 1 Application topically 3 (three) times daily., Disp: 180 g, Rfl: 1   omeprazole  (PRILOSEC) 20 MG capsule, Take 1 capsule (20 mg total) by mouth 2 (two) times daily., Disp: 180 capsule, Rfl: 0   ondansetron  (ZOFRAN -ODT) 4 MG disintegrating tablet, Take 1 tablet (4 mg total) by mouth every 8 (eight) hours as needed for nausea or vomiting, Disp: 60 tablet, Rfl: 0   OPZELURA  1.5 % CREA, APPLY ONE APPLICATION TOPICALLY DAILY. APPLY TO AFFECTED AREAS FOR ECZEMA AND VITILIGO, Disp: 60 g, Rfl: 3   rosuvastatin  (CRESTOR ) 10 MG tablet, Take 1 tablet (10 mg total) by mouth daily., Disp: 90 tablet, Rfl: 1   Semaglutide , 1 MG/DOSE, 4 MG/3ML SOPN, Inject 1 mg as directed once a week., Disp: 9 mL, Rfl: 1   zolpidem  (AMBIEN ) 10 MG tablet, Take 1 tablet (10 mg total) by mouth at bedtime., Disp: 90 tablet, Rfl: 1   Rimegepant Sulfate  (NURTEC) 75 MG TBDP, Take 1 tablet (75 mg total) by mouth daily as needed., Disp: 9 tablet, Rfl: 1  No Known Allergies  I personally reviewed active problem list, medication list, allergies with the patient/caregiver today.   ROS  Ten systems reviewed and is  negative except as mentioned in HPI    Objective  Vitals:   10/04/23 0757  BP: 112/74  Pulse: 79  Resp: 16  SpO2: 99%  Weight: 140 lb 1.6 oz (63.5 kg)  Height: 5\' 3"  (1.6 m)  Body mass index is 24.82 kg/m.  Physical Exam  Constitutional: Patient appears well-developed and well-nourished.  No distress.  HEENT: head atraumatic, normocephalic, pupils equal and reactive to light, neck supple Cardiovascular: Normal rate, regular rhythm and normal heart sounds.  No murmur heard. No BLE edema. Pulmonary/Chest: Effort normal and breath sounds normal. No respiratory distress. Abdominal: Soft.  There is no tenderness. Psychiatric: Patient has a normal mood and affect. behavior is normal. Judgment and thought content normal.   Recent Results (from the past 2160 hours)  POCT glycosylated hemoglobin (Hb A1C)     Status: None   Collection Time: 10/04/23  8:02 AM  Result Value Ref Range   Hemoglobin A1C 5.4 4.0 - 5.6 %   HbA1c POC (<> result, manual entry)     HbA1c, POC (prediabetic range)     HbA1c, POC (controlled diabetic range)      Diabetic Foot Exam:  Diabetic foot exam was performed with the following findings:   No deformities, ulcerations, or other skin breakdown Normal sensation of 10g monofilament Intact posterior tibialis and dorsalis pedis pulses      PHQ2/9:    10/04/2023    7:49 AM 06/02/2023    7:47 AM 11/12/2022    8:20 AM 08/05/2022    9:57 AM 04/21/2022   11:42 AM  Depression screen PHQ 2/9  Decreased Interest 0 0 1 0 0  Down, Depressed, Hopeless 0 0 0 0 0  PHQ - 2 Score 0 0 1 0 0  Altered sleeping 0 0 3 0 3  Tired, decreased energy 0 0 1 0 3  Change in appetite 0 0 0 0 0  Feeling bad or failure about yourself  0 0 0 0 0  Trouble concentrating 0 0 0 0 0  Moving slowly or fidgety/restless 0 0 0 0 0  Suicidal thoughts 0 0 0 0 0  PHQ-9 Score 0 0 5 0 6  Difficult doing work/chores Not difficult at all        phq 9 is negative  Fall Risk:     10/04/2023    7:49 AM 06/02/2023    7:46 AM 11/12/2022    8:20 AM 08/05/2022    9:57 AM 04/21/2022   11:42 AM  Fall Risk   Falls in the past year? 0 0 0 0 0  Number falls in past yr: 0 0 0  0  Injury with Fall? 0 0 0  0  Risk for fall due to : No Fall Risks No Fall Risks No Fall Risks No Fall Risks No Fall Risks  Follow up Falls prevention discussed;Education provided;Falls evaluation completed Falls prevention discussed Falls prevention discussed Falls prevention discussed;Education provided;Falls evaluation completed Falls prevention discussed     Assessment & Plan  1. Type 2 diabetes with nephropathy (HCC) (Primary)  - POCT glycosylated hemoglobin (Hb A1C) - HM Diabetes Foot Exam - POCT HgB A1C  2. Mild recurrent major depression (HCC)  Continue medication  3. Encounter for screening mammogram for malignant neoplasm of breast  - MM 3D SCREENING MAMMOGRAM BILATERAL BREAST; Future  4. Migraine without aura and without status migrainosus, not intractable  - Rimegepant Sulfate  (NURTEC) 75 MG TBDP; Take 1 tablet (75 mg total) by mouth daily as needed.  Dispense: 9 tablet; Refill: 1  5. GERD without esophagitis  Doing well   6. History of bariatric surgery  stable  7. Hypertension, benign  BP is at goal  8. Chronic insomnia  She is still has  medication at home  9. Chronic idiopathic constipation   Seeing GI

## 2023-10-12 ENCOUNTER — Other Ambulatory Visit: Payer: Self-pay

## 2023-10-15 ENCOUNTER — Other Ambulatory Visit: Payer: Self-pay

## 2023-10-18 ENCOUNTER — Ambulatory Visit: Payer: Commercial Managed Care - PPO | Admitting: Physician Assistant

## 2023-10-26 ENCOUNTER — Ambulatory Visit: Payer: 59 | Admitting: Family Medicine

## 2023-11-21 ENCOUNTER — Ambulatory Visit
Admission: RE | Admit: 2023-11-21 | Discharge: 2023-11-21 | Disposition: A | Discharge status: Home / Self Care | Source: Ambulatory Visit | Attending: Nurse Practitioner | Admitting: Nurse Practitioner

## 2023-11-21 ENCOUNTER — Ambulatory Visit (INDEPENDENT_AMBULATORY_CARE_PROVIDER_SITE_OTHER)

## 2023-11-21 VITALS — BP 113/72 | HR 93 | Temp 98.1°F | Resp 16

## 2023-11-21 DIAGNOSIS — M7989 Other specified soft tissue disorders: Secondary | ICD-10-CM | POA: Diagnosis not present

## 2023-11-21 DIAGNOSIS — S60032A Contusion of left middle finger without damage to nail, initial encounter: Secondary | ICD-10-CM

## 2023-11-21 DIAGNOSIS — M19042 Primary osteoarthritis, left hand: Secondary | ICD-10-CM | POA: Diagnosis not present

## 2023-11-21 DIAGNOSIS — Z8679 Personal history of other diseases of the circulatory system: Secondary | ICD-10-CM

## 2023-11-21 NOTE — Discharge Instructions (Signed)
 The x-ray of your left long finger was negative for fracture or dislocation.  It does show mild osteoarthritis. You may take over-the-counter Tylenol as needed for pain or discomfort. Elevate the left upper extremity above the level of the heart is much as possible to help decrease swelling. Recommend warm compresses to the left long finger to help improve blood flow and circulation. Go to the emergency department immediately if you experience numbness, tingling, slurred speech, or worsening symptoms. Please follow-up with your PCP for further evaluation and treatment as needed. Follow-up as needed.

## 2023-11-21 NOTE — ED Provider Notes (Signed)
 [facility]    CSN: [id number] Arrival date & time: 11/21/23  1342      History   Chief Complaint Chief Complaint  Patient presents with   Finger Injury    Middle finger purple, blue and swelling for no reason. No injury at all. - Entered by patient    HPI Shannon Soto is a 53 y.o. female.   The history is provided by the patient.   Patient presents with a 2-day history of swelling and bruising to the left long finger.  She states that the finger was "blue and purple" in color.  Symptoms started while she was at work typing, denies any known injury or trauma.   She states the swelling and bruising have since improved.States that she has better range of motion of her finger since the swelling has improved.  Patient states that she does have mild tingling in the finger.   Patient reports prior history of Raynaud's, she states that the symptoms do not feel the same.  Patient states the same or similar incident occurred several years ago, and TIA was ruled out.  Review of the patient's chart shows that patient's symptoms were attributed to Raynaud's, she was provided nitroglycerin cream to increase blood flow to her finger with good improvement.    Past Medical History:  Diagnosis Date   Diabetes mellitus without complication (HCC)    improved after gastric sleeve   GERD (gastroesophageal reflux disease)    Hyperlipidemia    Hypertension    Migraine headache    2x/mo   Orthodontics    braces   Raynaud's syndrome     Patient Active Problem List   Diagnosis Date Noted   B12 deficiency 11/12/2022   Mild recurrent major depression (HCC) 11/12/2022   Hypertension, benign 11/12/2022   Type 2 diabetes with nephropathy (HCC) 11/12/2022   Headache disorder 09/10/2020   TIA (transient ischemic attack) 05/06/2020   Numbness and tingling in left hand 04/16/2020   Encounter for screening colonoscopy    Polyp of sigmoid colon    Controlled type 2 diabetes mellitus with  neuropathy (HCC) 05/12/2016   Raynaud's disease without gangrene 05/12/2016   Intertrigo 09/06/2015   Migraine headache without aura 02/28/2015   Essential hypertension 02/28/2015   History of obesity 02/28/2015   Hyperlipidemia 02/28/2015   GERD (gastroesophageal reflux disease) 02/28/2015   Vitamin D deficiency 02/28/2015   Chronic constipation 02/28/2015   Chronic insomnia 02/28/2015   Peripheral neuropathy 02/28/2015   Vitiligo 02/28/2015   Lower leg mass 02/28/2015   Depression with anxiety 02/28/2015   History of bariatric surgery 11/05/2014    Past Surgical History:  Procedure Laterality Date   ABDOMINAL HYSTERECTOMY     APPENDECTOMY     BREAST SURGERY     BREATH TEK H PYLORI N/A 08/27/2014   Procedure: BREATH TEK H PYLORI;  Surgeon: Valarie Merino, MD;  Location: [facility];  Service: General;  Laterality: N/A;   CATARACT EXTRACTION W/PHACO Left 09/20/2019   Procedure: CATARACT EXTRACTION PHACO AND INTRAOCULAR LENS PLACEMENT (IOC) LEFT 0.74 00:12.3 6.0%;  Surgeon: Lockie Mola, MD;  Location: [facility];  Service: Ophthalmology;  Laterality: Left;   CATARACT EXTRACTION W/PHACO Right 10/11/2019   Procedure: CATARACT EXTRACTION PHACO AND INTRAOCULAR LENS PLACEMENT (IOC)  RIGHT 1.55 00:24.1 6.4%;  Surgeon: Lockie Mola, MD;  Location: [facility];  Service: Ophthalmology;  Laterality: Right;  diabetic   COLONOSCOPY WITH PROPOFOL N/A 02/17/2019   Procedure: COLONOSCOPY WITH PROPOFOL;  Surgeon: Midge Minium, MD;  Location: [facility];  Service: Endoscopy;  Laterality: N/A;   HIATAL HERNIA REPAIR  11/05/2014   Procedure: LAPAROSCOPIC REPAIR OF HIATAL HERNIA;  Surgeon: Luretha Murphy, MD;  Location: [facility];  Service: General;;   LAPAROSCOPIC GASTRIC SLEEVE RESECTION N/A 11/05/2014   Procedure: LAPAROSCOPIC GASTRIC SLEEVE RESECTION;  Surgeon: Luretha Murphy, MD;  Location: [facility];  Service: General;  Laterality: N/A;   POLYPECTOMY  02/17/2019    Procedure: POLYPECTOMY;  Surgeon: Midge Minium, MD;  Location: [facility];  Service: Endoscopy;;   REDUCTION MAMMAPLASTY Bilateral 1997   UPPER GI ENDOSCOPY  11/05/2014   Procedure: UPPER GI ENDOSCOPY;  Surgeon: Luretha Murphy, MD;  Location: [facility];  Service: General;;    OB History   No obstetric history on file.      Home Medications    Prior to Admission medications   Medication Sig Start Date End Date Taking? Authorizing Provider  amLODipine (NORVASC) 10 MG tablet Take 1 tablet (10 mg total) by mouth daily. 06/02/23   Alba Cory, MD  aspirin EC 81 MG tablet Take 81 mg by mouth daily. 05/31/20   [provider]  Calcium Carb-Cholecalciferol (CALCIUM-VITAMIN D3) 500-400 MG-UNIT TABS Take 1 tablet by mouth 3 (three) times daily.    [provider]  cyanocobalamin 500 MCG tablet Take 500 mcg by mouth every 3 (three) days. Mon, wed, fri    [provider]  ferrous gluconate ([facility] IRON) 240 (27 FE) MG tablet Take 1 tablet (240 mg total) by mouth 3 (three) times daily with meals. 02/28/15   Alba Cory, MD  FLUoxetine (PROZAC) 10 MG capsule Take 1 capsule (10 mg total) by mouth daily. 06/02/23 06/01/24  Alba Cory, MD  gabapentin (NEURONTIN) 600 MG tablet Take 1.5 tablets (900 mg total) by mouth at bedtime. 06/02/23   Alba Cory, MD  losartan (COZAAR) 100 MG tablet Take 1 tablet (100 mg total) by mouth daily. 06/02/23 06/01/24  Alba Cory, MD  Multiple Vitamins-Minerals (MULTIVITAMIN ADULTS PO) Take 1 tablet by mouth daily. 08/22/19   [provider]  nystatin (MYCOSTATIN/NYSTOP) powder Apply 1 Application topically 3 (three) times daily. 06/02/23   Alba Cory, MD  omeprazole (PRILOSEC) 20 MG capsule Take 1 capsule (20 mg total) by mouth 2 (two) times daily. 02/16/23   Alba Cory, MD  ondansetron (ZOFRAN-ODT) 4 MG disintegrating tablet Take 1 tablet (4 mg total) by mouth every 8 (eight) hours as needed for nausea or  vomiting 06/02/23   Carlynn Purl, Danna Hefty, MD  OPZELURA 1.5 % CREA APPLY ONE APPLICATION TOPICALLY DAILY. APPLY TO AFFECTED AREAS FOR ECZEMA AND VITILIGO 12/22/21   Willeen Niece, MD  Rimegepant Sulfate (NURTEC) 75 MG TBDP Take 1 tablet (75 mg total) by mouth daily as needed. 10/04/23   Alba Cory, MD  rosuvastatin (CRESTOR) 10 MG tablet Take 1 tablet (10 mg total) by mouth daily. 06/02/23   Alba Cory, MD  Semaglutide, 1 MG/DOSE, 4 MG/3ML SOPN Inject 1 mg as directed once a week. 06/02/23   Alba Cory, MD  zolpidem (AMBIEN) 10 MG tablet Take 1 tablet (10 mg total) by mouth at bedtime. 06/02/23   Alba Cory, MD    Family History Family History  Problem Relation Age of Onset   Diabetes Paternal Uncle    Stroke Father    Hypertension Other    Breast cancer Maternal Grandmother 26    Social History Social History   Tobacco Use   Smoking status: Never  Smokeless tobacco: Never  Vaping Use   Vaping status: Never Used  Substance Use Topics   Alcohol use: No    Alcohol/week: 0.0 standard drinks of alcohol    Comment: occasional    Drug use: No     Allergies   Patient has no known allergies.   Review of Systems Review of Systems Per HPI  Physical Exam Triage Vital Signs ED Triage Vitals  Encounter Vitals Group     BP 11/21/23 1410 113/72     Systolic BP Percentile --      Diastolic BP Percentile --      Pulse Rate 11/21/23 1410 93     Resp 11/21/23 1410 16     Temp 11/21/23 1410 98.1 F (36.7 C)     Temp Source 11/21/23 1410 Oral     SpO2 11/21/23 1410 97 %     Weight --      Height --      Head Circumference --      Peak Flow --      Pain Score 11/21/23 1414 0     Pain Loc --      Pain Education --      Exclude from Growth Chart --    No data found.  Updated Vital Signs BP 113/72 (BP Location: Right Arm)   Pulse 93   Temp 98.1 F (36.7 C) (Oral)   Resp 16   SpO2 97%   Visual Acuity Right Eye Distance:   Left Eye Distance:   Bilateral  Distance:    Right Eye Near:   Left Eye Near:    Bilateral Near:     Physical Exam Vitals and nursing note reviewed.  Constitutional:      General: She is not in acute distress.    Appearance: Normal appearance.  HENT:     Head: Normocephalic.  Eyes:     Extraocular Movements: Extraocular movements intact.     Pupils: Pupils are equal, round, and reactive to light.  Musculoskeletal:     Left hand: Swelling (Left long finger) present. No deformity. Normal range of motion. Normal strength. Normal sensation. Normal capillary refill. Normal pulse.     Cervical back: Normal range of motion.     Comments: Mild bruising noted to the ventral aspect of the left long finger.  No deformity, or erythema present.  Skin:    General: Skin is warm and dry.  Neurological:     General: No focal deficit present.     Mental Status: She is alert and oriented to person, place, and time.  Psychiatric:        Mood and Affect: Mood normal.        Behavior: Behavior normal.     UC Treatments / Results  Labs (all labs ordered are listed, but only abnormal results are displayed) Labs Reviewed - No data to display  EKG   Radiology DG Finger Middle Left Result Date: 11/21/2023 CLINICAL DATA:  Swelling and bruising for 2 days, no history of trauma EXAM: LEFT MIDDLE FINGER 2+V COMPARISON:  None Available. FINDINGS: Frontal, oblique, and lateral views of the left third digit are obtained. No acute fracture, subluxation, or dislocation. Mild joint space narrowing and osteophyte formation of the distal interphalangeal joint consistent with osteoarthritis. Mild soft tissue swelling. No subcutaneous gas or radiopaque foreign body. IMPRESSION: 1. No acute bony abnormality. 2. Mild osteoarthritis. 3. Mild diffuse soft tissue swelling. Electronically Signed   By: Maxwell Caul.D.  On: 11/21/2023 14:39    Procedures Procedures (including critical care time)  Medications Ordered in UC Medications - No data  to display  Initial Impression / Assessment and Plan / UC Course  I have reviewed the triage vital signs and the nursing notes.  Pertinent labs & imaging results that were available during my care of the patient were reviewed by me and considered in my medical decision making (see chart for details).  X-ray of the left long finger is negative for fracture or dislocation.  It does show osteoarthritis and soft tissue swelling.  Difficult to determine the cause of the patient's symptoms at this time.  She does have underlying history of Raynaud's, on exam, cap refill was within normal limits, she has good range of motion of the long finger with decreased swelling, and good radial pulses.  Supportive care recommendations were provided and discussed with the patient to include over-the-counter Tylenol for pain, and elevation to help decrease swelling.  Will avoid the use of ice, but recommend warm compresses to the left long finger to improve blood flow.  Patient advised to follow-up with her PCP for further evaluation.  Patient was given strict ER follow-up precautions.  Patient was in agreement with this plan of care and verbalizes understanding.  All questions were answered.  Patient stable for discharge.  Final Clinical Impressions(s) / UC Diagnoses   Final diagnoses:  Swelling of left middle finger  Contusion of left middle finger without damage to nail, initial encounter  History of Raynaud's syndrome     Discharge Instructions      The x-ray of your left long finger was negative for fracture or dislocation.  It does show mild osteoarthritis. You may take over-the-counter Tylenol as needed for pain or discomfort. Elevate the left upper extremity above the level of the heart is much as possible to help decrease swelling. Recommend warm compresses to the left long finger to help improve blood flow and circulation. Go to the emergency department immediately if you experience numbness,  tingling, slurred speech, or worsening symptoms. Please follow-up with your PCP for further evaluation and treatment as needed. Follow-up as needed.     ED Prescriptions   None    PDMP not reviewed this encounter.   Abran Cantor, NP 11/21/23 1556

## 2023-11-21 NOTE — ED Triage Notes (Signed)
 Pt reports left middle finger, swelling, blue and purple in color, the last time this happened she had a mild TIA, that started with discoloration in her fingers and went up into her arm  pt states she has ray nards syndrome, that causes her hands to turn white then blue.

## 2023-11-23 ENCOUNTER — Encounter: Payer: Self-pay | Admitting: Family Medicine

## 2023-11-23 ENCOUNTER — Other Ambulatory Visit: Payer: Self-pay

## 2023-11-23 DIAGNOSIS — Z8673 Personal history of transient ischemic attack (TIA), and cerebral infarction without residual deficits: Secondary | ICD-10-CM

## 2023-11-23 DIAGNOSIS — E1121 Type 2 diabetes mellitus with diabetic nephropathy: Secondary | ICD-10-CM

## 2023-11-23 DIAGNOSIS — I10 Essential (primary) hypertension: Secondary | ICD-10-CM

## 2023-11-23 MED ORDER — AMLODIPINE BESYLATE 10 MG PO TABS
10.0000 mg | ORAL_TABLET | Freq: Every day | ORAL | 0 refills | Status: DC
  Filled 2023-11-23: qty 90, 90d supply, fill #0

## 2023-11-23 MED ORDER — ROSUVASTATIN CALCIUM 10 MG PO TABS
10.0000 mg | ORAL_TABLET | Freq: Every day | ORAL | 0 refills | Status: DC
Start: 2023-11-23 — End: 2024-02-01
  Filled 2023-11-23: qty 90, 90d supply, fill #0

## 2023-11-23 MED ORDER — ROSUVASTATIN CALCIUM 10 MG PO TABS
10.0000 mg | ORAL_TABLET | Freq: Every day | ORAL | 0 refills | Status: DC
  Filled 2023-11-23: qty 30, 30d supply, fill #0

## 2023-11-23 MED ORDER — AMLODIPINE BESYLATE 10 MG PO TABS
10.0000 mg | ORAL_TABLET | Freq: Every day | ORAL | 0 refills | Status: DC
  Filled 2023-11-23: qty 30, 30d supply, fill #0

## 2023-11-23 MED ORDER — SEMAGLUTIDE (1 MG/DOSE) 4 MG/3ML ~~LOC~~ SOPN
1.0000 mg | PEN_INJECTOR | SUBCUTANEOUS | 0 refills | Status: DC
Start: 2023-11-23 — End: 2023-11-23
  Filled 2023-11-23: qty 3, 28d supply, fill #0

## 2023-11-23 MED ORDER — SEMAGLUTIDE (1 MG/DOSE) 4 MG/3ML ~~LOC~~ SOPN
1.0000 mg | PEN_INJECTOR | SUBCUTANEOUS | 0 refills | Status: DC
  Filled 2023-11-23: qty 9, 84d supply, fill #0

## 2023-12-16 ENCOUNTER — Other Ambulatory Visit: Payer: Self-pay

## 2023-12-16 MED ORDER — LINACLOTIDE 72 MCG PO CAPS
72.0000 ug | ORAL_CAPSULE | Freq: Every day | ORAL | 1 refills | Status: AC
  Filled 2023-12-16: qty 90, 90d supply, fill #0
  Filled 2024-09-21: qty 90, 90d supply, fill #1

## 2023-12-16 NOTE — Telephone Encounter (Signed)
 Patient had not been seen since January of 2025. Please advise if it is still okay to call in a prescription

## 2023-12-20 ENCOUNTER — Encounter: Payer: Self-pay | Admitting: Family Medicine

## 2023-12-20 DIAGNOSIS — G629 Polyneuropathy, unspecified: Secondary | ICD-10-CM

## 2023-12-20 DIAGNOSIS — F5104 Psychophysiologic insomnia: Secondary | ICD-10-CM

## 2023-12-21 ENCOUNTER — Other Ambulatory Visit: Payer: Self-pay

## 2023-12-21 MED ORDER — ZOLPIDEM TARTRATE 10 MG PO TABS
10.0000 mg | ORAL_TABLET | Freq: Every day | ORAL | 0 refills | Status: DC
  Filled 2023-12-21: qty 90, 90d supply, fill #0

## 2023-12-21 MED ORDER — GABAPENTIN 600 MG PO TABS
900.0000 mg | ORAL_TABLET | Freq: Every day | ORAL | 0 refills | Status: DC
  Filled 2023-12-21: qty 135, 90d supply, fill #0

## 2023-12-22 ENCOUNTER — Telehealth: Payer: Self-pay | Admitting: Family Medicine

## 2023-12-22 NOTE — Telephone Encounter (Signed)
 Information regarding your request Member has an active PA on file which is expiring on 01/03/2024 and has [phone] no. of fills remaining.

## 2023-12-22 NOTE — Telephone Encounter (Signed)
 Semaglutide , 1 MG/DOSE, 4 MG/3ML SOPN   Key: [id number]

## 2024-02-01 ENCOUNTER — Encounter: Payer: Self-pay | Admitting: Family Medicine

## 2024-02-01 ENCOUNTER — Telehealth: Payer: Commercial Managed Care - PPO | Admitting: Family Medicine

## 2024-02-01 ENCOUNTER — Other Ambulatory Visit: Payer: Self-pay

## 2024-02-01 DIAGNOSIS — Z9884 Bariatric surgery status: Secondary | ICD-10-CM | POA: Diagnosis not present

## 2024-02-01 DIAGNOSIS — G43009 Migraine without aura, not intractable, without status migrainosus: Secondary | ICD-10-CM

## 2024-02-01 DIAGNOSIS — I10 Essential (primary) hypertension: Secondary | ICD-10-CM | POA: Diagnosis not present

## 2024-02-01 DIAGNOSIS — G629 Polyneuropathy, unspecified: Secondary | ICD-10-CM | POA: Diagnosis not present

## 2024-02-01 DIAGNOSIS — K5904 Chronic idiopathic constipation: Secondary | ICD-10-CM

## 2024-02-01 DIAGNOSIS — Z8673 Personal history of transient ischemic attack (TIA), and cerebral infarction without residual deficits: Secondary | ICD-10-CM

## 2024-02-01 DIAGNOSIS — F330 Major depressive disorder, recurrent, mild: Secondary | ICD-10-CM | POA: Diagnosis not present

## 2024-02-01 DIAGNOSIS — I7300 Raynaud's syndrome without gangrene: Secondary | ICD-10-CM | POA: Diagnosis not present

## 2024-02-01 DIAGNOSIS — E1121 Type 2 diabetes mellitus with diabetic nephropathy: Secondary | ICD-10-CM | POA: Diagnosis not present

## 2024-02-01 DIAGNOSIS — K219 Gastro-esophageal reflux disease without esophagitis: Secondary | ICD-10-CM | POA: Diagnosis not present

## 2024-02-01 DIAGNOSIS — F5104 Psychophysiologic insomnia: Secondary | ICD-10-CM | POA: Diagnosis not present

## 2024-02-01 DIAGNOSIS — Z7985 Long-term (current) use of injectable non-insulin antidiabetic drugs: Secondary | ICD-10-CM

## 2024-02-01 MED ORDER — ROSUVASTATIN CALCIUM 10 MG PO TABS
10.0000 mg | ORAL_TABLET | Freq: Every day | ORAL | 0 refills | Status: DC
  Filled 2024-02-01 – 2024-03-20 (×2): qty 90, 90d supply, fill #0

## 2024-02-01 MED ORDER — OMEPRAZOLE 20 MG PO CPDR
20.0000 mg | DELAYED_RELEASE_CAPSULE | Freq: Two times a day (BID) | ORAL | 0 refills | Status: DC
Start: 2024-02-01 — End: 2024-06-06
  Filled 2024-02-01 – 2024-03-20 (×2): qty 180, 90d supply, fill #0

## 2024-02-01 MED ORDER — AMLODIPINE BESYLATE 10 MG PO TABS
10.0000 mg | ORAL_TABLET | Freq: Every day | ORAL | 1 refills | Status: DC
  Filled 2024-02-01 – 2024-03-20 (×2): qty 90, 90d supply, fill #0
  Filled 2024-06-23: qty 90, 90d supply, fill #1

## 2024-02-01 MED ORDER — LOSARTAN POTASSIUM 100 MG PO TABS
100.0000 mg | ORAL_TABLET | Freq: Every day | ORAL | 1 refills | Status: DC
  Filled 2024-02-01 – 2024-03-20 (×2): qty 90, 90d supply, fill #0
  Filled 2024-06-23: qty 90, 90d supply, fill #1

## 2024-02-01 MED ORDER — ONDANSETRON 4 MG PO TBDP
4.0000 mg | ORAL_TABLET | Freq: Three times a day (TID) | ORAL | 0 refills | Status: DC | PRN
Start: 2024-02-01 — End: 2024-06-06
  Filled 2024-02-01 – 2024-03-20 (×2): qty 60, 20d supply, fill #0

## 2024-02-01 MED ORDER — SEMAGLUTIDE (1 MG/DOSE) 4 MG/3ML ~~LOC~~ SOPN
1.0000 mg | PEN_INJECTOR | SUBCUTANEOUS | 0 refills | Status: DC
  Filled 2024-02-01 – 2024-03-20 (×2): qty 9, 84d supply, fill #0

## 2024-02-01 MED ORDER — GABAPENTIN 600 MG PO TABS
900.0000 mg | ORAL_TABLET | Freq: Every day | ORAL | 1 refills | Status: DC
  Filled 2024-02-01 – 2024-03-20 (×2): qty 135, 90d supply, fill #0
  Filled 2024-06-23: qty 135, 90d supply, fill #1

## 2024-02-01 MED ORDER — ZOLPIDEM TARTRATE 10 MG PO TABS
10.0000 mg | ORAL_TABLET | Freq: Every day | ORAL | 0 refills | Status: DC
  Filled 2024-02-01 – 2024-03-23 (×2): qty 90, 90d supply, fill #0

## 2024-02-01 MED ORDER — FLUOXETINE HCL 10 MG PO CAPS
10.0000 mg | ORAL_CAPSULE | Freq: Every day | ORAL | 1 refills | Status: DC
  Filled 2024-02-01 – 2024-03-20 (×2): qty 90, 90d supply, fill #0
  Filled 2024-06-23: qty 90, 90d supply, fill #1

## 2024-02-01 NOTE — Progress Notes (Signed)
 Name: Shannon Soto   MRN: [id number]    DOB: 05-21-1971   Date:02/01/2024       Progress Note  Subjective  Chief Complaint  Chief Complaint  Patient presents with   Medical Management of Chronic Issues    I connected with  Alison Irvine  on 02/01/24 at  8:00 AM EDT by a video enabled telemedicine application and verified that I am speaking with the correct person using two identifiers.  I discussed the limitations of evaluation and management by telemedicine and the availability of in person appointments. The patient expressed understanding and agreed to proceed with the virtual visit  Staff also discussed with the patient that there may be a patient responsible charge related to this service. Patient Location: parking lot at work  Provider Location: [facility] Additional Individuals present: alone   HPI   History of TIA: admitted back in August 2021 with left middle finger pain, intense, seen by neurologist, questionable TIA versus severe Raynaud's , symptoms lasted days but resolved, now symptoms still happens but associated with change in color and likely from Raynaud's.   She has seen Neurologist, Rheumatologist and Cardiologist,  Dr. Carl Charnley and will follow up yearly. She had another visit to [facility] on 03/2022 with  left side tingling and numbness. This time, CT neck, brain and MRI brain all within normal limits. Echo showed normal EF and only mild tricuspid regurgitation. She has not seen a neurologist since the initial TIA but she is up to date with visits with cardiologist Unchanged    Insomnia: she has chronic insomnia, she takes Ambien  and denies side effects . She still has interrupt sleep, able to sleep 4 hours with medication, but unable to sleep without the medication. She has tried other medications but does not last either. She also takes hydroxyzine  prn to help with sleep . Unchanged    DMII with peripheral neuropathy and nephropathy:  off metformin since bariatric surgery in  10/2014 , no polyphagia, but has episodes of polydipsia but no polyuria but still has nocturia  She takes  Gabapentin  for neuropathy. She is back on Losartan  due to microalbuminuria and HTN.  Her A1C has been within normal for a long time , she has been tolerating Ozempic  currently at 1 mg weekly , no side effects.    Bariatric Surgery: she had sleeve surgery done by Dr. Gaylyn Keas on March 14th, 2016, Weight prior to surgery was 242 lbs She is doing well, hgbA1C started to go up, and we started her on Ozempic  02/2017 weight was in the 140 range until Fall 2020 when weight went up to 150 lbs  since Feb 2021 she lost down to 137 lbs by increasing her walks, weight was stable however when admitted to [facility] for possible TIA in August 2021  she lost her appetite and weight dropped to 131 lbs. Weight is up a little at 140 lbs. She has not checked her weight at home lately. Clothes are fitting the same    Eczema: she is using topical Opzelura  prn eczema and also some of the vitiligo spots and seems to be helping She still has some spots on antecubital area and anterior chest.    Vitiligo: seen by Dr. Annette Barters, using a new topical medication but some of the spots are getting darker, but down to Opzelura  once a day instead of BID   GERD: she has a long history of reflux, symptoms were controlled with Omeprazole  20 mg  BID, discussed long  term use of PPI. She tried to skip doses but symptoms returns, she needs refills    Chronic Idiopathic constipation: she has a long history of constipation, unable to tolerate Miralax , she is taking Linzess  72 mg a couple of times a week and seems to be working well for her.    Hyperlipidemia: Reviewed last labs and LDL was at goal at 56 continue Rosuvastatin  . No side effects    HTN: she is on Norvasc  for Raynaud's and Losartan  for microalbuminuria  No chest pain or  dizziness. She states she only has palpitation when stressed .Her father had a stroke in April and that was  stressful for her   Major depression: chronic symptoms since her son was born. She has been taking Fluoxetine , one daily, occasionally takes one extra pill during the day, discussed taking hydroxizine prn for anxiety . No side effects. She was very stressed when father had a stroke but doing better now    Migraines: she has tried multiple medications, but could not tolerate, episodes about 1-2 times months. Episodes are described as  pain is behind her eyes, throbbing like, associated with nausea when she has a severe episode and photophobia. It can last up to 4 hours with medication ( Tylenol   ) she takes Goodypowders prn ,explained importance of taking Nurtec instead  Raynaud's: symptoms of  fingers on both hands gets white , painful and numb, not sure of the triggers at this time.  Worse in the cold weather . She had a severe episode in March that required her to go to urgent Care, it lasted 4 days even when applying Nitrostat  . UC recommended evaluation by vascular surgeon   Patient Active Problem List   Diagnosis Date Noted   B12 deficiency 11/12/2022   Mild recurrent major depression (HCC) 11/12/2022   Hypertension, benign 11/12/2022   Type 2 diabetes with nephropathy (HCC) 11/12/2022   Headache disorder 09/10/2020   TIA (transient ischemic attack) 05/06/2020   Numbness and tingling in left hand 04/16/2020   Encounter for screening colonoscopy    Polyp of sigmoid colon    Controlled type 2 diabetes mellitus with neuropathy (HCC) 05/12/2016   Raynaud's disease without gangrene 05/12/2016   Intertrigo 09/06/2015   Migraine headache without aura 02/28/2015   Essential hypertension 02/28/2015   History of obesity 02/28/2015   Hyperlipidemia 02/28/2015   GERD (gastroesophageal reflux disease) 02/28/2015   Vitamin D  deficiency 02/28/2015   Chronic constipation 02/28/2015   Chronic insomnia 02/28/2015   Peripheral neuropathy 02/28/2015   Vitiligo 02/28/2015   Lower leg mass 02/28/2015    Depression with anxiety 02/28/2015   History of bariatric surgery 11/05/2014    Past Surgical History:  Procedure Laterality Date   ABDOMINAL HYSTERECTOMY     APPENDECTOMY     BREAST SURGERY     BREATH TEK H PYLORI N/A 08/27/2014   Procedure: BREATH TEK H PYLORI;  Surgeon: Azucena Bollard, MD;  Location: [facility];  Service: General;  Laterality: N/A;   CATARACT EXTRACTION W/PHACO Left 09/20/2019   Procedure: CATARACT EXTRACTION PHACO AND INTRAOCULAR LENS PLACEMENT (IOC) LEFT 0.74 00:12.3 6.0%;  Surgeon: Annell Kidney, MD;  Location: [facility];  Service: Ophthalmology;  Laterality: Left;   CATARACT EXTRACTION W/PHACO Right 10/11/2019   Procedure: CATARACT EXTRACTION PHACO AND INTRAOCULAR LENS PLACEMENT (IOC)  RIGHT 1.55 00:24.1 6.4%;  Surgeon: Annell Kidney, MD;  Location: [facility];  Service: Ophthalmology;  Laterality: Right;  diabetic   COLONOSCOPY WITH PROPOFOL  N/A  02/17/2019   Procedure: COLONOSCOPY WITH PROPOFOL ;  Surgeon: Marnee Sink, MD;  Location: [facility];  Service: Endoscopy;  Laterality: N/A;   HIATAL HERNIA REPAIR  11/05/2014   Procedure: LAPAROSCOPIC REPAIR OF HIATAL HERNIA;  Surgeon: Jacolyn Matar, MD;  Location: [facility];  Service: General;;   LAPAROSCOPIC GASTRIC SLEEVE RESECTION N/A 11/05/2014   Procedure: LAPAROSCOPIC GASTRIC SLEEVE RESECTION;  Surgeon: Jacolyn Matar, MD;  Location: [facility];  Service: General;  Laterality: N/A;   POLYPECTOMY  02/17/2019   Procedure: POLYPECTOMY;  Surgeon: Marnee Sink, MD;  Location: [facility];  Service: Endoscopy;;   REDUCTION MAMMAPLASTY Bilateral 1997   UPPER GI ENDOSCOPY  11/05/2014   Procedure: UPPER GI ENDOSCOPY;  Surgeon: Jacolyn Matar, MD;  Location: [facility];  Service: General;;    Family History  Problem Relation Age of Onset   Diabetes Paternal Uncle    Stroke Father    Hypertension Other    Breast cancer Maternal Grandmother 53    Social History   Socioeconomic  History   Marital status: Single    Spouse name: Not on file   Number of children: 3   Years of education: Not on file   Highest education level: Associate degree: occupational, Scientist, product/process development, or vocational program  Occupational History   Not on file  Tobacco Use   Smoking status: Never   Smokeless tobacco: Never  Vaping Use   Vaping status: Never Used  Substance and Sexual Activity   Alcohol use: No    Alcohol/week: 0.0 standard drinks of alcohol    Comment: occasional    Drug use: No   Sexual activity: Yes  Other Topics Concern   Not on file  Social History Narrative   Not on file   Social Drivers of Health   Financial Resource Strain: Low Risk  (10/04/2023)   Overall Financial Resource Strain (CARDIA)    Difficulty of Paying Living Expenses: Not hard at all  Food Insecurity: No Food Insecurity (10/04/2023)   Hunger Vital Sign    Worried About Running Out of Food in the Last Year: Never true    Ran Out of Food in the Last Year: Never true  Transportation Needs: No Transportation Needs (10/04/2023)   PRAPARE - Administrator, Civil Service (Medical): No    Lack of Transportation (Non-Medical): No  Physical Activity: Insufficiently Active (10/04/2023)   Exercise Vital Sign    Days of Exercise per Week: 2 days    Minutes of Exercise per Session: 20 min  Stress: No Stress Concern Present (10/04/2023)   [employer] of Occupational Health - Occupational Stress Questionnaire    Feeling of Stress : Not at all  Social Connections: Moderately Integrated (10/04/2023)   Social Connection and Isolation Panel [NHANES]    Frequency of Communication with Friends and Family: More than three times a week    Frequency of Social Gatherings with Friends and Family: Once a week    Attends Religious Services: 1 to 4 times per year    Active Member of [employer] or Organizations: Yes    Attends Banker Meetings: 1 to 4 times per year    Marital Status: Never married   Intimate Partner Violence: Not At Risk (02/08/2020)   Humiliation, Afraid, Rape, and Kick questionnaire    Fear of Current or Ex-Partner: No    Emotionally Abused: No    Physically Abused: No    Sexually Abused: No     Current Outpatient Medications:  amLODipine  (NORVASC ) 10 MG tablet, Take 1 tablet (10 mg total) by mouth daily., Disp: 90 tablet, Rfl: 0   aspirin  EC 81 MG tablet, Take 81 mg by mouth daily., Disp: , Rfl:    Calcium  Carb-Cholecalciferol  (CALCIUM -VITAMIN D3) 500-400 MG-UNIT TABS, Take 1 tablet by mouth 3 (three) times daily., Disp: , Rfl:    cyanocobalamin  500 MCG tablet, Take 500 mcg by mouth every 3 (three) days. Mon, wed, fri, Disp: , Rfl:    ferrous gluconate  ([employer] IRON ) 240 (27 FE) MG tablet, Take 1 tablet (240 mg total) by mouth 3 (three) times daily with meals., Disp: 30 tablet, Rfl: 0   FLUoxetine  (PROZAC ) 10 MG capsule, Take 1 capsule (10 mg total) by mouth daily., Disp: 90 capsule, Rfl: 1   gabapentin  (NEURONTIN ) 600 MG tablet, Take 1.5 tablets (900 mg total) by mouth at bedtime., Disp: 135 tablet, Rfl: 0   linaclotide  (LINZESS ) 72 MCG capsule, Take 1 capsule (72 mcg total) by mouth daily before breakfast., Disp: 90 capsule, Rfl: 1   losartan  (COZAAR ) 100 MG tablet, Take 1 tablet (100 mg total) by mouth daily., Disp: 90 tablet, Rfl: 1   Multiple Vitamins-Minerals (MULTIVITAMIN ADULTS PO), Take 1 tablet by mouth daily., Disp: , Rfl:    nystatin  (MYCOSTATIN /NYSTOP ) powder, Apply 1 Application topically 3 (three) times daily., Disp: 180 g, Rfl: 1   omeprazole  (PRILOSEC) 20 MG capsule, Take 1 capsule (20 mg total) by mouth 2 (two) times daily., Disp: 180 capsule, Rfl: 0   ondansetron  (ZOFRAN -ODT) 4 MG disintegrating tablet, Take 1 tablet (4 mg total) by mouth every 8 (eight) hours as needed for nausea or vomiting, Disp: 60 tablet, Rfl: 0   OPZELURA  1.5 % CREA, APPLY ONE APPLICATION TOPICALLY DAILY. APPLY TO AFFECTED AREAS FOR ECZEMA AND VITILIGO, Disp: 60 g, Rfl: 3    Rimegepant Sulfate  (NURTEC) 75 MG TBDP, Take 1 tablet (75 mg total) by mouth daily as needed., Disp: 9 tablet, Rfl: 1   rosuvastatin  (CRESTOR ) 10 MG tablet, Take 1 tablet (10 mg total) by mouth daily., Disp: 90 tablet, Rfl: 0   Semaglutide , 1 MG/DOSE, 4 MG/3ML SOPN, Inject 1 mg into the skin once a week., Disp: 9 mL, Rfl: 0   zolpidem  (AMBIEN ) 10 MG tablet, Take 1 tablet (10 mg total) by mouth at bedtime., Disp: 90 tablet, Rfl: 0  No Known Allergies  I personally reviewed active problem list, medication list, allergies with the patient/caregiver today.   ROS  Ten systems reviewed and is negative except as mentioned in HPI    Objective  Virtual encounter, vitals not obtained.  There is no height or weight on file to calculate BMI.  Physical Exam  Awake alert and oriented   PHQ2/9:    02/01/2024    7:57 AM 10/04/2023    7:49 AM 06/02/2023    7:47 AM 11/12/2022    8:20 AM 08/05/2022    9:57 AM  Depression screen PHQ 2/9  Decreased Interest 0 0 0 1 0  Down, Depressed, Hopeless 0 0 0 0 0  PHQ - 2 Score 0 0 0 1 0  Altered sleeping 0 0 0 3 0  Tired, decreased energy 0 0 0 1 0  Change in appetite 0 0 0 0 0  Feeling bad or failure about yourself  0 0 0 0 0  Trouble concentrating 0 0 0 0 0  Moving slowly or fidgety/restless 0 0 0 0 0  Suicidal thoughts 0 0 0 0 0  PHQ-9 Score  0 0 0 5 0  Difficult doing work/chores Not difficult at all Not difficult at all      PHQ-2/9 Result is negative.    Fall Risk:    02/01/2024    7:57 AM 10/04/2023    7:49 AM 06/02/2023    7:46 AM 11/12/2022    8:20 AM 08/05/2022    9:57 AM  Fall Risk   Falls in the past year? 0 0 0 0 0  Number falls in past yr: 0 0 0 0   Injury with Fall? 0 0 0 0   Risk for fall due to : No Fall Risks No Fall Risks No Fall Risks No Fall Risks No Fall Risks  Follow up Falls prevention discussed;Education provided;Falls evaluation completed Falls prevention discussed;Education provided;Falls evaluation completed Falls  prevention discussed Falls prevention discussed Falls prevention discussed;Education provided;Falls evaluation completed     Assessment & Plan Raynaud's Phenomenon Severe episode with worsening symptoms. Previous rheumatology evaluation recommended vascular surgery consultation. Nitroglycerin  ointment ineffective. - Refer to vascular surgery for further evaluation.  Hypertension Managed with losartan  and amlodipine . No side effects reported. - Continue losartan  and amlodipine . - Provide refills for losartan  and amlodipine .  Type 2 Diabetes Mellitus with microalbuminuria and dyslipidemia Managed with Ozempic . A1c under control, no hyperglycemia symptoms. - Continue Ozempic . - Provide refill for Ozempic .  Dyslipidemia Managed with rosuvastatin . Cholesterol levels well controlled. - Continue rosuvastatin . - Provide refill for rosuvastatin .  Peripheral Neuropathy Managed with gabapentin . Uses heat for symptom relief. - Continue gabapentin . - Provide refill for gabapentin .  Migraine Migraines 1-2 times/month managed with Nurtec. Regular headaches managed with Tylenol  due to GI bleed risk with goody powders. - Continue Nurtec for migraines. - Advise use of Tylenol  for regular headaches. - Provide refill for Nurtec.  Chronic Insomnia Managed with zolpidem . No side effects reported. Uses hydroxyzine  sparingly. - Continue zolpidem . - Provide refill for zolpidem .  Gastroesophageal Reflux Disease (GERD) Managed with omeprazole . Refill requested. - Continue omeprazole . - Provide refill for omeprazole .  Chronic Idiopathic Constipation Managed with Linzess  72 mcg, taken 1-2 times a week. - Continue Linzess  72 mcg as currently managed.  Eczema/Vitiligo Managed with Opzelura  and Vyleesi. Improvement on face, persistent issues on hands and lips. - Continue Opzelura  and Vyleesi.  General Health Maintenance Scheduled for lab work in October during physical examination. - Schedule  lab work for October during physical examination.  Follow-up Routine evaluation in four months. - Schedule follow-up appointment in four months.     I discussed the assessment and treatment plan with the patient. The patient was provided an opportunity to ask questions and all were answered. The patient agreed with the plan and demonstrated an understanding of the instructions.  The patient was advised to call back or seek an in-person evaluation if the symptoms worsen or if the condition fails to improve as anticipated.  I provided 25  minutes of non-face-to-face time during this encounter.

## 2024-02-04 ENCOUNTER — Other Ambulatory Visit: Payer: Self-pay

## 2024-03-07 ENCOUNTER — Other Ambulatory Visit: Payer: Self-pay

## 2024-03-09 ENCOUNTER — Other Ambulatory Visit: Payer: Self-pay

## 2024-03-13 ENCOUNTER — Other Ambulatory Visit: Payer: Self-pay

## 2024-03-20 ENCOUNTER — Other Ambulatory Visit: Payer: Self-pay

## 2024-03-23 ENCOUNTER — Telehealth: Payer: Self-pay | Admitting: Pharmacy Technician

## 2024-03-23 ENCOUNTER — Other Ambulatory Visit: Payer: Self-pay

## 2024-03-23 ENCOUNTER — Other Ambulatory Visit (HOSPITAL_COMMUNITY): Payer: Self-pay

## 2024-03-23 NOTE — Telephone Encounter (Signed)
 Pharmacy Patient Advocate Encounter  Received notification from [facility] that Prior Authorization for Nurtec 75MG  dispersible tablets has been APPROVED from 03/23/24 to 03/23/25   PA #/Case ID/Reference #: [id number]

## 2024-03-23 NOTE — Telephone Encounter (Signed)
 Pharmacy Patient Advocate Encounter   Received notification from CoverMyMeds that prior authorization for Nurtec 75MG  dispersible tablets is due for renewal.   Insurance verification completed.   The patient is insured through [facility].  Action: PA required; PA submitted to above mentioned insurance via CoverMyMeds Key/confirmation #/EOC [id number] Status is pending

## 2024-03-31 DIAGNOSIS — Z1272 Encounter for screening for malignant neoplasm of vagina: Secondary | ICD-10-CM | POA: Diagnosis not present

## 2024-03-31 DIAGNOSIS — Z6826 Body mass index (BMI) 26.0-26.9, adult: Secondary | ICD-10-CM | POA: Diagnosis not present

## 2024-03-31 DIAGNOSIS — Z01419 Encounter for gynecological examination (general) (routine) without abnormal findings: Secondary | ICD-10-CM | POA: Diagnosis not present

## 2024-05-02 DIAGNOSIS — Z961 Presence of intraocular lens: Secondary | ICD-10-CM | POA: Diagnosis not present

## 2024-05-02 DIAGNOSIS — E119 Type 2 diabetes mellitus without complications: Secondary | ICD-10-CM | POA: Diagnosis not present

## 2024-05-02 DIAGNOSIS — H04123 Dry eye syndrome of bilateral lacrimal glands: Secondary | ICD-10-CM | POA: Diagnosis not present

## 2024-05-02 LAB — HM DIABETES EYE EXAM

## 2024-05-27 DIAGNOSIS — S61211A Laceration without foreign body of left index finger without damage to nail, initial encounter: Secondary | ICD-10-CM | POA: Diagnosis not present

## 2024-05-28 ENCOUNTER — Telehealth: Admitting: Family

## 2024-05-28 ENCOUNTER — Encounter: Payer: Self-pay | Admitting: Family Medicine

## 2024-05-28 DIAGNOSIS — S61218D Laceration without foreign body of other finger without damage to nail, subsequent encounter: Secondary | ICD-10-CM

## 2024-05-28 NOTE — Progress Notes (Signed)
 Hello, we can not prescribe anything stronger than what you were given through the Urgent Care. If you are having that much pain,  I recommend that you be seen in a face-to-face visit to be evaluated.    NOTE: There will be NO CHARGE for this E-Visit   If you are having a true medical emergency, please call 911.     For an urgent face to face visit, [facility] has multiple urgent care centers for your convenience.  Click the link below for the full list of locations and hours, walk-in wait times, appointment scheduling options and driving directions:  Urgent Care - [place], [place], [place], [place], [place], [place]  [place]     Your MyChart E-visit questionnaire answers were reviewed by a board certified advanced clinical practitioner to complete your personal care plan based on your specific symptoms.    Thank you for using e-Visits.

## 2024-05-29 ENCOUNTER — Other Ambulatory Visit: Payer: Self-pay

## 2024-05-29 ENCOUNTER — Ambulatory Visit (INDEPENDENT_AMBULATORY_CARE_PROVIDER_SITE_OTHER): Admitting: Family Medicine

## 2024-05-29 ENCOUNTER — Encounter: Payer: Self-pay | Admitting: Family Medicine

## 2024-05-29 VITALS — BP 119/76 | HR 76 | Temp 98.0°F

## 2024-05-29 DIAGNOSIS — S61218D Laceration without foreign body of other finger without damage to nail, subsequent encounter: Secondary | ICD-10-CM | POA: Diagnosis not present

## 2024-05-29 MED ORDER — TRAMADOL HCL 50 MG PO TABS
50.0000 mg | ORAL_TABLET | Freq: Two times a day (BID) | ORAL | 0 refills | Status: AC | PRN
  Filled 2024-05-29: qty 6, 3d supply, fill #0

## 2024-05-29 MED ORDER — SULFAMETHOXAZOLE-TRIMETHOPRIM 800-160 MG PO TABS
1.0000 | ORAL_TABLET | Freq: Two times a day (BID) | ORAL | 0 refills | Status: DC
  Filled 2024-05-29: qty 14, 7d supply, fill #0

## 2024-05-29 NOTE — Progress Notes (Signed)
 BP 119/76   Pulse 76   Temp 98 F (36.7 C) (Oral)   SpO2 98%    Subjective:    Patient ID: Shannon Soto, female    DOB: [birth year], 53 y.o.   MRN: [id number]  HPI: Shannon Soto is a 53 y.o. female  Chief Complaint  Patient presents with   Injury    Left index finger. Onset late Saturday evening. Lac to finger. Seen in UC. 5 stitches.    UC FOLLOW UP- was outside and caught her finger with the hedge clippers. Went to the UC and got 5 stitches, but has been in pretty intense pain. Time since discharge: 2 days Hospital/facility: [facility] Diagnosis: Finger Laceration Procedures/tests: stitches, tetanus Consultants: none New medications: none Discharge instructions: follow up with PCP  Status: stable  Has been taking 800mg  ibuprofen  every 12 hours and 1000mg  tylenol  every 6-8 hours. Not able to sleep woke her up repeatedly. Throbbing pain. + swelling, No redness or oozing. Has not been able to work due to discomfort. No other issues at this time.   Relevant past medical, surgical, family and social history reviewed and updated as indicated. Interim medical history since our last visit reviewed. Allergies and medications reviewed and updated.  Review of Systems  Constitutional: Negative.   Respiratory: Negative.    Cardiovascular: Negative.   Musculoskeletal: Negative.   Skin:  Positive for wound. Negative for color change, pallor and rash.  Neurological: Negative.   Psychiatric/Behavioral: Negative.      Per HPI unless specifically indicated above     Objective:    BP 119/76   Pulse 76   Temp 98 F (36.7 C) (Oral)   SpO2 98%   Wt Readings from Last 3 Encounters:  10/04/23 140 lb 1.6 oz (63.5 kg)  09/07/23 140 lb 6.4 oz (63.7 kg)  06/02/23 134 lb (60.8 kg)    Physical Exam Vitals and nursing note reviewed.  Constitutional:      General: She is not in acute distress.    Appearance: Normal appearance. She is well-developed.   HENT:     Head: Normocephalic and atraumatic.     Right Ear: Hearing and external ear normal.     Left Ear: Hearing and external ear normal.     Nose: Nose normal.     Mouth/Throat:     Mouth: Mucous membranes are moist.     Pharynx: Oropharynx is clear.  Eyes:     General: Lids are normal. No scleral icterus.       Right eye: No discharge.        Left eye: No discharge.     Conjunctiva/sclera: Conjunctivae normal.  Pulmonary:     Effort: Pulmonary effort is normal. No respiratory distress.  Musculoskeletal:        General: Normal range of motion.  Skin:    Coloration: Skin is not jaundiced or pale.     Findings: No bruising, erythema, lesion or rash.     Comments: Laceration with 5 stitches on L index finger, very tender to palpation with slight swelling and erythema  Neurological:     General: No focal deficit present.     Mental Status: She is alert and oriented to person, place, and time. Mental status is at baseline.  Psychiatric:        Mood and Affect: Mood normal.        Speech: Speech normal.        Behavior: Behavior  normal.        Thought Content: Thought content normal.        Judgment: Judgment normal.     Results for orders placed or performed in visit on 05/02/24  [facility] DIABETES EYE EXAM   Collection Time: 05/02/24  2:42 PM  Result Value Ref Range   HM Diabetic Eye Exam No Retinopathy No Retinopathy      Assessment & Plan:   Problem List Items Addressed This Visit   None Visit Diagnoses       Laceration of index finger without foreign body without damage to nail, unspecified laterality, subsequent encounter    -  Primary   Given degree of pain, concern for infection- will treat with bactrim and short course of tramadol. Discussed ibuprofen /tylenol  dosing- follow up with PCP.        Follow up plan: Return for as able with PCP.

## 2024-06-05 ENCOUNTER — Other Ambulatory Visit: Payer: Self-pay | Admitting: Family Medicine

## 2024-06-05 DIAGNOSIS — Z1231 Encounter for screening mammogram for malignant neoplasm of breast: Secondary | ICD-10-CM

## 2024-06-06 ENCOUNTER — Other Ambulatory Visit: Payer: Self-pay

## 2024-06-06 ENCOUNTER — Encounter: Payer: Self-pay | Admitting: Family Medicine

## 2024-06-06 ENCOUNTER — Telehealth (INDEPENDENT_AMBULATORY_CARE_PROVIDER_SITE_OTHER): Admitting: Family Medicine

## 2024-06-06 DIAGNOSIS — K219 Gastro-esophageal reflux disease without esophagitis: Secondary | ICD-10-CM | POA: Diagnosis not present

## 2024-06-06 DIAGNOSIS — F330 Major depressive disorder, recurrent, mild: Secondary | ICD-10-CM

## 2024-06-06 DIAGNOSIS — I7300 Raynaud's syndrome without gangrene: Secondary | ICD-10-CM

## 2024-06-06 DIAGNOSIS — F5104 Psychophysiologic insomnia: Secondary | ICD-10-CM

## 2024-06-06 DIAGNOSIS — G629 Polyneuropathy, unspecified: Secondary | ICD-10-CM | POA: Diagnosis not present

## 2024-06-06 DIAGNOSIS — Z8673 Personal history of transient ischemic attack (TIA), and cerebral infarction without residual deficits: Secondary | ICD-10-CM

## 2024-06-06 DIAGNOSIS — Z9884 Bariatric surgery status: Secondary | ICD-10-CM

## 2024-06-06 DIAGNOSIS — E60 Dietary zinc deficiency: Secondary | ICD-10-CM

## 2024-06-06 DIAGNOSIS — I10 Essential (primary) hypertension: Secondary | ICD-10-CM

## 2024-06-06 DIAGNOSIS — G43009 Migraine without aura, not intractable, without status migrainosus: Secondary | ICD-10-CM

## 2024-06-06 DIAGNOSIS — E1121 Type 2 diabetes mellitus with diabetic nephropathy: Secondary | ICD-10-CM

## 2024-06-06 DIAGNOSIS — Z7985 Long-term (current) use of injectable non-insulin antidiabetic drugs: Secondary | ICD-10-CM

## 2024-06-06 MED ORDER — OMEPRAZOLE 20 MG PO CPDR
20.0000 mg | DELAYED_RELEASE_CAPSULE | Freq: Two times a day (BID) | ORAL | 0 refills | Status: DC
  Filled 2024-06-06: qty 180, 90d supply, fill #0

## 2024-06-06 MED ORDER — ZOLPIDEM TARTRATE 10 MG PO TABS
10.0000 mg | ORAL_TABLET | Freq: Every day | ORAL | 0 refills | Status: DC
  Filled 2024-06-06 – 2024-06-24 (×3): qty 90, 90d supply, fill #0

## 2024-06-06 MED ORDER — ROSUVASTATIN CALCIUM 10 MG PO TABS
10.0000 mg | ORAL_TABLET | Freq: Every day | ORAL | 1 refills | Status: AC
  Filled 2024-06-06 – 2024-06-23 (×2): qty 90, 90d supply, fill #0
  Filled 2024-09-21: qty 90, 90d supply, fill #1

## 2024-06-06 MED ORDER — ONDANSETRON 4 MG PO TBDP
4.0000 mg | ORAL_TABLET | Freq: Three times a day (TID) | ORAL | 0 refills | Status: DC | PRN
  Filled 2024-06-06 – 2024-06-23 (×2): qty 60, 20d supply, fill #0

## 2024-06-06 MED ORDER — SEMAGLUTIDE (1 MG/DOSE) 4 MG/3ML ~~LOC~~ SOPN
1.0000 mg | PEN_INJECTOR | SUBCUTANEOUS | 1 refills | Status: AC
  Filled 2024-06-06 – 2024-06-23 (×2): qty 9, 84d supply, fill #0
  Filled 2024-09-21: qty 9, 84d supply, fill #1

## 2024-06-06 NOTE — Progress Notes (Signed)
 Name: Shannon Soto   MRN: [id number]    DOB: 1970/08/28   Date:06/06/2024       Progress Note  Subjective  Chief Complaint  Chief Complaint  Patient presents with   Medical Management of Chronic Issues    I connected with  Wonda JULIANNA Sauers  on 06/06/24 at 10:40 AM EDT by a video enabled telemedicine application and verified that I am speaking with the correct person using two identifiers.  I discussed the limitations of evaluation and management by telemedicine and the availability of in person appointments. The patient expressed understanding and agreed to proceed with the virtual visit  Staff also discussed with the patient that there may be a patient responsible charge related to this service. Patient Location: in her car/parked at work  Provider Location: [facility] Additional Individuals present: alone   Discussed the use of AI scribe software for clinical note transcription with the patient, who gave verbal consent to proceed.  History of Present Illness Shannon Soto is a 53 year old female who presents for a follow-up visit.  She has a history of bariatric surgery, resulting in vitamin deficiencies including low B12, vitamin D , and zinc  levels. She takes two multivitamins, one B12 sublingual tablet three times a week, and calcium . Her iron  levels have been trending down, with ferritin levels recorded at 101, 87, and 62 over the past year.  Her type 2 diabetes is managed with Ozempic , effectively controlling her glucose levels. No symptoms of uncontrolled diabetes such as excessive hunger or thirst. She does not regularly check her blood sugar levels in the morning. She also has nephropathy, previously indicated by protein in the urine, currently under control with losartan .  Hypertension is managed with amlodipine  10 mg and losartan . Her blood pressure was recorded at 119/76 on October 6, within the normal range. She also takes norvasc   for Raynaud's syndrome.  She sustained  a laceration on her first forefinger while trimming hedges, requiring stitches. Treated at an urgent care in [place], she received tramadol for pain and an antibiotic for a minor infection. Pain is now better controlled, though she still experiences some throbbing and tingling she is going back to urgent care tomorrow to remove stitches .  She takes rosuvastatin  10 mg due to a history of TIA and diabetes, with no issues reported with this medication.  She experiences gastroesophageal reflux disease (GERD) and takes omeprazole  20 mg twice daily. Despite this, she reports waking up at night with severe symptoms, including vomiting.  She has major depression, mild recurrent, and is taking Prozac  10 mg daily. No changes in her depression symptoms. Her son, who has autism, started an additional three years of schooling, which has been emotionally challenging for her.  She reports a recent severe migraine that kept her out of work for the first time in years. She takes Nurtec for migraines and ondansetron  for nausea. Describes post-migraine symptoms as 'woozy' and 'sore.'  She has peripheral neuropathy not related to diabetes, managed with gabapentin , which she finds effective but cannot take during the day due to drowsiness.  She takes Ambien  for sleep, as she cannot sleep with other medications. She also takes ondansetron , omeprazole , rosuvastatin , and Ozempic .     Patient Active Problem List   Diagnosis Date Noted   B12 deficiency 11/12/2022   Mild recurrent major depression 11/12/2022   Hypertension, benign 11/12/2022   Type 2 diabetes with nephropathy (HCC) 11/12/2022   Headache disorder 09/10/2020   TIA (transient  ischemic attack) 05/06/2020   Numbness and tingling in left hand 04/16/2020   Encounter for screening colonoscopy    Polyp of sigmoid colon    Controlled type 2 diabetes mellitus with neuropathy (HCC) 05/12/2016   Raynaud's disease without gangrene 05/12/2016   Intertrigo  09/06/2015   Migraine headache without aura 02/28/2015   Essential hypertension 02/28/2015   History of obesity 02/28/2015   Hyperlipidemia 02/28/2015   GERD (gastroesophageal reflux disease) 02/28/2015   Vitamin D  deficiency 02/28/2015   Chronic constipation 02/28/2015   Chronic insomnia 02/28/2015   Peripheral neuropathy 02/28/2015   Vitiligo 02/28/2015   Lower leg mass 02/28/2015   Depression with anxiety 02/28/2015   History of bariatric surgery 11/05/2014    Past Surgical History:  Procedure Laterality Date   ABDOMINAL HYSTERECTOMY     APPENDECTOMY     BREAST SURGERY     BREATH TEK H PYLORI N/A 08/27/2014   Procedure: BREATH TEK H PYLORI;  Surgeon: Donnice KATHEE Lunger, MD;  Location: [facility];  Service: General;  Laterality: N/A;   CATARACT EXTRACTION W/PHACO Left 09/20/2019   Procedure: CATARACT EXTRACTION PHACO AND INTRAOCULAR LENS PLACEMENT (IOC) LEFT 0.74 00:12.3 6.0%;  Surgeon: Mittie Gaskin, MD;  Location: [facility];  Service: Ophthalmology;  Laterality: Left;   CATARACT EXTRACTION W/PHACO Right 10/11/2019   Procedure: CATARACT EXTRACTION PHACO AND INTRAOCULAR LENS PLACEMENT (IOC)  RIGHT 1.55 00:24.1 6.4%;  Surgeon: Mittie Gaskin, MD;  Location: [facility];  Service: Ophthalmology;  Laterality: Right;  diabetic   COLONOSCOPY WITH PROPOFOL  N/A 02/17/2019   Procedure: COLONOSCOPY WITH PROPOFOL ;  Surgeon: Jinny Carmine, MD;  Location: [facility];  Service: Endoscopy;  Laterality: N/A;   HIATAL HERNIA REPAIR  11/05/2014   Procedure: LAPAROSCOPIC REPAIR OF HIATAL HERNIA;  Surgeon: Donnice Lunger, MD;  Location: [facility];  Service: General;;   LAPAROSCOPIC GASTRIC SLEEVE RESECTION N/A 11/05/2014   Procedure: LAPAROSCOPIC GASTRIC SLEEVE RESECTION;  Surgeon: Donnice Lunger, MD;  Location: [facility];  Service: General;  Laterality: N/A;   POLYPECTOMY  02/17/2019   Procedure: POLYPECTOMY;  Surgeon: Jinny Carmine, MD;  Location: [facility];   Service: Endoscopy;;   REDUCTION MAMMAPLASTY Bilateral 1997   UPPER GI ENDOSCOPY  11/05/2014   Procedure: UPPER GI ENDOSCOPY;  Surgeon: Donnice Lunger, MD;  Location: [facility];  Service: General;;    Family History  Problem Relation Age of Onset   Diabetes Paternal Uncle    Stroke Father    Hypertension Other    Breast cancer Maternal Grandmother 49    Social History   Socioeconomic History   Marital status: Single    Spouse name: Not on file   Number of children: 3   Years of education: Not on file   Highest education level: Associate degree: occupational, Scientist, product/process development, or vocational program  Occupational History   Not on file  Tobacco Use   Smoking status: Never   Smokeless tobacco: Never  Vaping Use   Vaping status: Never Used  Substance and Sexual Activity   Alcohol use: No    Alcohol/week: 0.0 standard drinks of alcohol    Comment: occasional    Drug use: No   Sexual activity: Yes  Other Topics Concern   Not on file  Social History Narrative   Not on file   Social Drivers of Health   Financial Resource Strain: Low Risk  (10/04/2023)   Overall Financial Resource Strain (CARDIA)    Difficulty of Paying Living Expenses: Not hard at all  Food  Insecurity: No Food Insecurity (10/04/2023)   Hunger Vital Sign    Worried About Running Out of Food in the Last Year: Never true    Ran Out of Food in the Last Year: Never true  Transportation Needs: No Transportation Needs (10/04/2023)   PRAPARE - Administrator, Civil Service (Medical): No    Lack of Transportation (Non-Medical): No  Physical Activity: Insufficiently Active (10/04/2023)   Exercise Vital Sign    Days of Exercise per Week: 2 days    Minutes of Exercise per Session: 20 min  Stress: No Stress Concern Present (10/04/2023)   [employer] of Occupational Health - Occupational Stress Questionnaire    Feeling of Stress : Not at all  Social Connections: Moderately Integrated (10/04/2023)   Social  Connection and Isolation Panel    Frequency of Communication with Friends and Family: More than three times a week    Frequency of Social Gatherings with Friends and Family: Once a week    Attends Religious Services: 1 to 4 times per year    Active Member of [employer] or Organizations: Yes    Attends Banker Meetings: 1 to 4 times per year    Marital Status: Never married  Intimate Partner Violence: Not At Risk (02/08/2020)   Humiliation, Afraid, Rape, and Kick questionnaire    Fear of Current or Ex-Partner: No    Emotionally Abused: No    Physically Abused: No    Sexually Abused: No     Current Outpatient Medications:    amLODipine  (NORVASC ) 10 MG tablet, Take 1 tablet (10 mg total) by mouth daily., Disp: 90 tablet, Rfl: 1   aspirin  EC 81 MG tablet, Take 81 mg by mouth daily., Disp: , Rfl:    Calcium  Carb-Cholecalciferol  (CALCIUM -VITAMIN D3) 500-400 MG-UNIT TABS, Take 1 tablet by mouth 3 (three) times daily., Disp: , Rfl:    cyanocobalamin  500 MCG tablet, Take 500 mcg by mouth every 3 (three) days. Mon, wed, fri, Disp: , Rfl:    ferrous gluconate  ([employer] IRON ) 240 (27 FE) MG tablet, Take 1 tablet (240 mg total) by mouth 3 (three) times daily with meals., Disp: 30 tablet, Rfl: 0   FLUoxetine  (PROZAC ) 10 MG capsule, Take 1 capsule (10 mg total) by mouth daily., Disp: 90 capsule, Rfl: 1   gabapentin  (NEURONTIN ) 600 MG tablet, Take 1.5 tablets (900 mg total) by mouth at bedtime., Disp: 135 tablet, Rfl: 1   linaclotide  (LINZESS ) 72 MCG capsule, Take 1 capsule (72 mcg total) by mouth daily before breakfast., Disp: 90 capsule, Rfl: 1   losartan  (COZAAR ) 100 MG tablet, Take 1 tablet (100 mg total) by mouth daily., Disp: 90 tablet, Rfl: 1   Multiple Vitamins-Minerals (MULTIVITAMIN ADULTS PO), Take 1 tablet by mouth daily., Disp: , Rfl:    nystatin  (MYCOSTATIN /NYSTOP ) powder, Apply 1 Application topically 3 (three) times daily., Disp: 180 g, Rfl: 1   omeprazole  (PRILOSEC) 20 MG capsule, Take  1 capsule (20 mg total) by mouth 2 (two) times daily., Disp: 180 capsule, Rfl: 0   ondansetron  (ZOFRAN -ODT) 4 MG disintegrating tablet, Take 1 tablet (4 mg total) by mouth every 8 (eight) hours as needed for nausea or vomiting, Disp: 60 tablet, Rfl: 0   OPZELURA  1.5 % CREA, APPLY ONE APPLICATION TOPICALLY DAILY. APPLY TO AFFECTED AREAS FOR ECZEMA AND VITILIGO, Disp: 60 g, Rfl: 3   Rimegepant Sulfate  (NURTEC) 75 MG TBDP, Take 1 tablet (75 mg total) by mouth daily as needed., Disp: 9 tablet, Rfl:  1   rosuvastatin  (CRESTOR ) 10 MG tablet, Take 1 tablet (10 mg total) by mouth daily., Disp: 90 tablet, Rfl: 0   Semaglutide , 1 MG/DOSE, 4 MG/3ML SOPN, Inject 1 mg into the skin once a week., Disp: 9 mL, Rfl: 0   zolpidem  (AMBIEN ) 10 MG tablet, Take 1 tablet (10 mg total) by mouth at bedtime. Fill July 31 st 2025, Disp: 90 tablet, Rfl: 0  No Known Allergies  I personally reviewed active problem list, medication list, allergies with the patient/caregiver today.   ROS  Ten systems reviewed and is negative except as mentioned in HPI    Objective  Virtual encounter, vitals not obtained.  There is no height or weight on file to calculate BMI.  Physical Exam  Awake, alert and oriented   PHQ2/9:    06/06/2024   10:23 AM 02/01/2024    7:57 AM 10/04/2023    7:49 AM 06/02/2023    7:47 AM 11/12/2022    8:20 AM  Depression screen PHQ 2/9  Decreased Interest 0 0 0 0 1  Down, Depressed, Hopeless 0 0 0 0 0  PHQ - 2 Score 0 0 0 0 1  Altered sleeping 0 0 0 0 3  Tired, decreased energy 0 0 0 0 1  Change in appetite 0 0 0 0 0  Feeling bad or failure about yourself  0 0 0 0 0  Trouble concentrating 0 0 0 0 0  Moving slowly or fidgety/restless 0 0 0 0 0  Suicidal thoughts 0 0 0 0 0  PHQ-9 Score 0 0 0 0 5  Difficult doing work/chores Not difficult at all Not difficult at all Not difficult at all     PHQ-2/9 Result is negative.    Fall Risk:    06/06/2024   10:23 AM 02/01/2024    7:57 AM 10/04/2023     7:49 AM 06/02/2023    7:46 AM 11/12/2022    8:20 AM  Fall Risk   Falls in the past year? 0 0 0 0 0  Number falls in past yr: 0 0 0 0 0  Injury with Fall? 0 0 0 0 0  Risk for fall due to : No Fall Risks No Fall Risks No Fall Risks No Fall Risks No Fall Risks  Follow up Falls evaluation completed Falls prevention discussed;Education provided;Falls evaluation completed Falls prevention discussed;Education provided;Falls evaluation completed Falls prevention discussed Falls prevention discussed     Assessment & Plan Type 2 diabetes mellitus with nephropathy Well-controlled with Ozempic  and losartan . Proteinuria under control. - Continue Ozempic . - Continue losartan . - Order A1c with next lab work.  Hypertension and Raynaud's phenomenon Hypertension controlled with amlodipine  and losartan . Raynaud's managed with amlodipine . - Continue amlodipine  10 mg daily. - Continue losartan .  Peripheral neuropathy (non-diabetic) Managed with gabapentin . - Continue gabapentin .  Vitamin B12, vitamin D , zinc , and iron  deficiency status post bariatric surgery Deficiencies post-surgery. Monitoring necessary due to malabsorption. - Order labs to check B12, vitamin D , zinc , and iron  levels. - Continue current vitamin regimen.  History of Obesity status post bariatric surgery Monitoring nutritional deficiencies and weight maintenance post-surgery.  Gastroesophageal reflux disease (GERD) Symptoms not well-controlled with omeprazole . Concern for post-surgery complications. - Refer to Dr. Therisa for further evaluation. - Discuss potential need for endoscopy.  Chronic idiopathic constipation Managed by Dr. Therisa.  Migraine Recent severe episode. Usual medication ineffective due to late administration. - Consider nasal medication for severe episodes if needed.  Major depressive disorder Well-managed on  Prozac . Emotional challenges noted. - Continue Prozac  10 mg daily.  Insomnia - Refill Ambien  with  no refills, to be filled on November 1st.  Transient ischemic attack (TIA) History of TIA. Managed with rosuvastatin  due to diabetes and TIA history. - Continue rosuvastatin  10 mg daily.  Laceration of right index finger with local infection, healing Stitches to be removed. - Return to  for stitch removal.    I discussed the assessment and treatment plan with the patient. The patient was provided an opportunity to ask questions and all were answered. The patient agreed with the plan and demonstrated an understanding of the instructions.  The patient was advised to call back or seek an in-person evaluation if the symptoms worsen or if the condition fails to improve as anticipated.  I provided 25  minutes of non-face-to-face time during this encounter.

## 2024-06-07 ENCOUNTER — Other Ambulatory Visit: Payer: Self-pay

## 2024-06-12 ENCOUNTER — Telehealth: Payer: Self-pay | Admitting: Physician Assistant

## 2024-06-12 NOTE — Telephone Encounter (Signed)
 Good afternoon Shannon Soto,   Patient is wishing to be seen with you for vomiting, GERD, burning sensation, and globus sensation. Patient was last seen with you in January at [place]. Patients is very concerned about symptoms. Please review and advise on scheduling further.   Thank you.

## 2024-06-13 NOTE — Telephone Encounter (Signed)
 Pt scheduled to see Shannon Soto tomorrow at 2:30pm. She really wants a Thurs or Fri due to work. Let he know we can watch for a cancellation and will let her know.

## 2024-06-13 NOTE — Telephone Encounter (Signed)
 Called patient to schedule appointment with Ellouise. Patient stated she needs to be seen before December. Advised I can put her on cancellation list and give a call if we have something sooner come available. Patient is requesting a call to discuss if there is any recommendations to help in the meantime. Please advise, thank you

## 2024-06-14 ENCOUNTER — Other Ambulatory Visit: Payer: Self-pay

## 2024-06-14 ENCOUNTER — Encounter: Payer: Self-pay | Admitting: Physician Assistant

## 2024-06-14 ENCOUNTER — Ambulatory Visit: Admitting: Physician Assistant

## 2024-06-14 VITALS — BP 112/74 | HR 70 | Ht 62.0 in | Wt 142.1 lb

## 2024-06-14 DIAGNOSIS — R1310 Dysphagia, unspecified: Secondary | ICD-10-CM

## 2024-06-14 DIAGNOSIS — K219 Gastro-esophageal reflux disease without esophagitis: Secondary | ICD-10-CM | POA: Diagnosis not present

## 2024-06-14 MED ORDER — PANTOPRAZOLE SODIUM 40 MG PO TBEC
40.0000 mg | DELAYED_RELEASE_TABLET | Freq: Every day | ORAL | 3 refills | Status: AC
  Filled 2024-06-14: qty 90, 90d supply, fill #0
  Filled 2024-09-21: qty 90, 90d supply, fill #1

## 2024-06-14 NOTE — Progress Notes (Signed)
 Shannon Console, PA-C [address] [place], [place]  [address] Phone: [phone]   Primary Care Physician: Soto, Krichna, MD  Primary Gastroenterologist:  Shannon Console, PA-C / Dr. Gordy Starch   Chief Complaint: Worsening acid reflux, follow-up chronic constipation       HPI:   Discussed the use of AI scribe software for clinical note transcription with the patient, who gave verbal consent to proceed.  I last saw patient at [facility] 08/2023 for chronic constipation.  Treated with Linzess  samples.  She has had chronic constipation for over 30 years.  Worsened by Ozempic .  In the past tried Amitiza  and Linzess  which caused diarrhea.  Also tried Senokot, Colace stool softener, MiraLAX , fiber supplement, and probiotic with little benefit.  History of Present Illness Shannon Soto is a 53 year old female with a history of gastric sleeve surgery and hiatal hernia repair who presents with increased acid reflux and a sensation of a lump in her throat.  She experiences increased frequency of acid reflux, particularly at night, with a sour, bitter taste. She reports that the reflux often occurs at night and sometimes she has to hurry to the bathroom to spit out the reflux. She is on omeprazole  20 mg twice daily but occasionally misses her evening dose due to her work schedule and exhaustion.  On Friday, she began feeling a sensation of a lump in her throat, described as mucus that feels stuck and does not move with swallowing. On Saturday, she experienced a burning sensation when eating and drinking, described as a 'cool burning sensation' similar to sucking on a cough drop. This sensation persisted on Monday when she tried to eat a hash brown and when chewing gum, she felt a cool sensation down her sternum.  She has a history of gastric sleeve surgery in 2016, which initially helped her lose weight and reduce her medication use. However, her weight and A1c levels increased again,  leading to the initiation of semaglutide  in 2018, which has helped maintain her A1c levels.  For constipation, she uses Lenzess by opening the capsule and taking a reduced dose twice a week, along with stool softeners, to manage her symptoms without affecting her work schedule. This regimen allows her to control her bowel movements without frequent bathroom trips during work hours.  She recently sustained a finger injury requiring stitches, but reports no fever and received a tetanus shot. The injury is healing well.    Has occasional chronic abdominal cramping, bloating, swelling, gas, nausea.  History of IBS-C.   History of gastric sleeve surgery and hiatal hernia repair in 2016.  Has difficulty swallowing a lot of liquids.   01/2019 Screening colonoscopy by Dr. Jinny with Suprep: 3 mm hyperplastic polyp removed from sigmoid colon.  Excellent prep.  Nonbleeding grade 2 internal hemorrhoids.  10-year repeat.    Current Outpatient Medications  Medication Sig Dispense Refill   amLODipine  (NORVASC ) 10 MG tablet Take 1 tablet (10 mg total) by mouth daily. 90 tablet 1   aspirin  EC 81 MG tablet Take 81 mg by mouth daily.     Calcium  Carb-Cholecalciferol  (CALCIUM -VITAMIN D3) 500-400 MG-UNIT TABS Take 1 tablet by mouth 3 (three) times daily.     cyanocobalamin  500 MCG tablet Take 500 mcg by mouth every 3 (three) days. Mon, wed, fri     ferrous gluconate  ([employer] IRON ) 240 (27 FE) MG tablet Take 1 tablet (240 mg total) by mouth 3 (three) times daily with  meals. 30 tablet 0   FLUoxetine  (PROZAC ) 10 MG capsule Take 1 capsule (10 mg total) by mouth daily. 90 capsule 1   gabapentin  (NEURONTIN ) 600 MG tablet Take 1.5 tablets (900 mg total) by mouth at bedtime. 135 tablet 1   linaclotide  (LINZESS ) 72 MCG capsule Take 1 capsule (72 mcg total) by mouth daily before breakfast. 90 capsule 1   losartan  (COZAAR ) 100 MG tablet Take 1 tablet (100 mg total) by mouth daily. 90 tablet 1   Multiple Vitamins-Minerals  (MULTIVITAMIN ADULTS PO) Take 1 tablet by mouth daily.     nystatin  (MYCOSTATIN /NYSTOP ) powder Apply 1 Application topically 3 (three) times daily. 180 g 1   omeprazole  (PRILOSEC) 20 MG capsule Take 1 capsule (20 mg total) by mouth 2 (two) times daily. 180 capsule 0   ondansetron  (ZOFRAN -ODT) 4 MG disintegrating tablet Take 1 tablet (4 mg total) by mouth every 8 (eight) hours as needed for nausea or vomiting 60 tablet 0   OPZELURA  1.5 % CREA APPLY ONE APPLICATION TOPICALLY DAILY. APPLY TO AFFECTED AREAS FOR ECZEMA AND VITILIGO 60 g 3   Rimegepant Sulfate  (NURTEC) 75 MG TBDP Take 1 tablet (75 mg total) by mouth daily as needed. 9 tablet 1   rosuvastatin  (CRESTOR ) 10 MG tablet Take 1 tablet (10 mg total) by mouth daily. 90 tablet 1   Semaglutide , 1 MG/DOSE, 4 MG/3ML SOPN Inject 1 mg into the skin once a week. 9 mL 1   zolpidem  (AMBIEN ) 10 MG tablet Take 1 tablet (10 mg total) by mouth at bedtime. Fill July 31 st 2025 90 tablet 0   No current facility-administered medications for this visit.    Allergies as of 06/14/2024   (No Known Allergies)    Past Medical History:  Diagnosis Date   Diabetes mellitus without complication (HCC)    improved after gastric sleeve   GERD (gastroesophageal reflux disease)    Hyperlipidemia    Hypertension    Migraine headache    2x/mo   Orthodontics    braces   Raynaud's syndrome     Past Surgical History:  Procedure Laterality Date   ABDOMINAL HYSTERECTOMY     APPENDECTOMY     BREAST SURGERY     BREATH TEK H PYLORI N/A 08/27/2014   Procedure: BREATH TEK H PYLORI;  Surgeon: Shannon KATHEE Lunger, MD;  Location: [facility];  Service: General;  Laterality: N/A;   CATARACT EXTRACTION W/PHACO Left 09/20/2019   Procedure: CATARACT EXTRACTION PHACO AND INTRAOCULAR LENS PLACEMENT (IOC) LEFT 0.74 00:12.3 6.0%;  Surgeon: Shannon Gaskin, MD;  Location: [facility];  Service: Ophthalmology;  Laterality: Left;   CATARACT EXTRACTION W/PHACO Right  10/11/2019   Procedure: CATARACT EXTRACTION PHACO AND INTRAOCULAR LENS PLACEMENT (IOC)  RIGHT 1.55 00:24.1 6.4%;  Surgeon: Shannon Gaskin, MD;  Location: [facility];  Service: Ophthalmology;  Laterality: Right;  diabetic   COLONOSCOPY WITH PROPOFOL  N/A 02/17/2019   Procedure: COLONOSCOPY WITH PROPOFOL ;  Surgeon: Jinny Carmine, MD;  Location: [facility];  Service: Endoscopy;  Laterality: N/A;   HIATAL HERNIA REPAIR  11/05/2014   Procedure: LAPAROSCOPIC REPAIR OF HIATAL HERNIA;  Surgeon: Shannon Lunger, MD;  Location: [facility];  Service: General;;   LAPAROSCOPIC GASTRIC SLEEVE RESECTION N/A 11/05/2014   Procedure: LAPAROSCOPIC GASTRIC SLEEVE RESECTION;  Surgeon: Shannon Lunger, MD;  Location: [facility];  Service: General;  Laterality: N/A;   POLYPECTOMY  02/17/2019   Procedure: POLYPECTOMY;  Surgeon: Jinny Carmine, MD;  Location: [facility];  Service: Endoscopy;;  REDUCTION MAMMAPLASTY Bilateral 1997   UPPER GI ENDOSCOPY  11/05/2014   Procedure: UPPER GI ENDOSCOPY;  Surgeon: Shannon Lunger, MD;  Location: [facility];  Service: General;;    Review of Systems:    All systems reviewed and negative except where noted in HPI.    Physical Exam:  BP 112/74 (BP Location: Left Arm, Patient Position: Sitting, Cuff Size: Normal)   Pulse 70   Ht 5' 2 (1.575 m)   Wt 142 lb 2 oz (64.5 kg)   BMI 26.00 kg/m  No LMP recorded. Patient has had a hysterectomy.  General: Well-nourished, well-developed in no acute distress.  Lungs: Clear to auscultation bilaterally. Non-labored. Heart: Regular rate and rhythm, no murmurs rubs or gallops.  Abdomen: Bowel sounds are normal; Abdomen is Soft; No hepatosplenomegaly, masses or hernias;  Mild Tender in the epigastrium and lower abdomen; No guarding or rebound tenderness. Neuro: Alert and oriented x 3.  Grossly intact.  Psych: Alert and cooperative, normal mood and affect.   Imaging Studies: No results found.  Labs: CBC    Component Value  Date/Time   WBC 6.3 06/02/2023 0827   RBC 4.73 06/02/2023 0827   HGB 12.1 06/02/2023 0827   HGB 12.3 05/27/2012 0905   HCT 39.7 06/02/2023 0827   HCT 38.6 05/27/2012 0905   PLT 353 06/02/2023 0827   PLT 392 05/27/2012 0905   MCV 83.9 06/02/2023 0827   MCV 77 (L) 05/27/2012 0905   MCH 25.6 (L) 06/02/2023 0827   MCHC 30.5 (L) 06/02/2023 0827   RDW 13.2 06/02/2023 0827   RDW 16.0 (H) 05/27/2012 0905   LYMPHSABS 2,245 08/05/2022 1032   LYMPHSABS 2.5 05/27/2012 0905   MONOABS 0.6 04/12/2022 2130   MONOABS 0.4 05/27/2012 0905   EOSABS 189 06/02/2023 0827   EOSABS 0.1 05/27/2012 0905   BASOSABS 50 06/02/2023 0827   BASOSABS 0.1 05/27/2012 0905    CMP     Component Value Date/Time   NA 141 06/02/2023 0827   NA 140 05/22/2014 0736   K 4.2 06/02/2023 0827   K 4.0 05/22/2014 0736   CL 104 06/02/2023 0827   CL 107 05/22/2014 0736   CO2 32 06/02/2023 0827   CO2 27 05/22/2014 0736   GLUCOSE 84 06/02/2023 0827   GLUCOSE 114 (H) 05/22/2014 0736   BUN 19 06/02/2023 0827   BUN 21 (H) 05/22/2014 0736   CREATININE 0.75 06/02/2023 0827   CALCIUM  9.7 06/02/2023 0827   CALCIUM  8.9 05/22/2014 0736   PROT 6.7 06/02/2023 0827   PROT 7.4 05/22/2014 0736   ALBUMIN 3.6 04/13/2022 0453   ALBUMIN 3.8 05/22/2014 0736   AST 19 06/02/2023 0827   AST 12 (L) 05/22/2014 0736   ALT 24 06/02/2023 0827   ALT 25 05/22/2014 0736   ALKPHOS 69 04/13/2022 0453   ALKPHOS 119 (H) 05/22/2014 0736   BILITOT 0.3 06/02/2023 0827   BILITOT 0.2 05/22/2014 0736   GFRNONAA >60 04/13/2022 0453   GFRNONAA 88 04/25/2020 1431   GFRAA 102 04/25/2020 1431       Assessment and Plan:   LAKESIA DAHLE is a 53 y.o. y/o female   1.  Irritable bowel syndrome, constipation predominant.  Chronic idiopathic constipation. - Continue Linzess  72 mcg 2-3 times per week. - Continue OTC stool softener and Senokot as needed.  2.  Worsening acid reflux, not controlled on omeprazole  20 mg twice daily.  She has globus  sensation and odynophagia.  Not controlled on omeprazole  20 mg twice  daily.  Differential includes hiatal hernia, gastritis, and esophagitis. - Discontinue omeprazole . - Prescribe pantoprazole  40 mg once daily before breakfast. - Schedule upper endoscopy to evaluate for hiatal hernia, gastritis, and esophagitis. Scheduling EGD I discussed risks of EGD with patient to include risk of bleeding, perforation, and risk of sedation.  Patient expressed understanding and agrees to proceed with EGD.   3.  History of gastric sleeve surgery  4.  Colon cancer screening - Is up-to-date.               10-year repeat colonoscopy will be due 01/2029.   Shannon Console, PA-C  Follow up 4 weeks after EGD with TG.

## 2024-06-14 NOTE — Patient Instructions (Signed)
 You have been scheduled for an Endoscopy. Please follow written instructions given to you at your visit today.  If you use inhalers (even only as needed), please bring them with you on the day of your procedure.  If you take any of the following medications, they will need to be adjusted prior to your procedure:   DO NOT TAKE 7 DAYS PRIOR TO TEST- Trulicity (dulaglutide) Ozempic , Wegovy  (semaglutide ) Mounjaro (tirzepatide) Bydureon Bcise (exanatide extended release)  DO NOT TAKE 1 DAY PRIOR TO YOUR TEST Rybelsus  (semaglutide ) Adlyxin (lixisenatide) Victoza (liraglutide) Byetta (exanatide) ___________________________________________________________________________  Please follow up sooner if symptoms increase or worsen  Due to recent changes in healthcare laws, you may see the results of your imaging and laboratory studies on MyChart before your provider has had a chance to review them.  We understand that in some cases there may be results that are confusing or concerning to you. Not all laboratory results come back in the same time frame and the provider may be waiting for multiple results in order to interpret others.  Please give us  48 hours in order for your provider to thoroughly review all the results before contacting the office for clarification of your results.   Thank you for trusting me with your gastrointestinal care!   [name], PA-C _______________________________________________________  If your blood pressure at your visit was 140/90 or greater, please contact your primary care physician to follow up on this.  _______________________________________________________  If you are age 82 or older, your body mass index should be between 23-30. Your Body mass index is 26 kg/m. If this is out of the aforementioned range listed, please consider follow up with your Primary Care Provider.  If you are age 72 or younger, your body mass index should be between 19-25. Your  Body mass index is 26 kg/m. If this is out of the aformentioned range listed, please consider follow up with your Primary Care Provider.   ________________________________________________________  The [facility] providers would like to encourage you to use MYCHART to communicate with providers for non-urgent requests or questions.  Due to long hold times on the telephone, sending your provider a message by [facility] may be a faster and more efficient way to get a response.  Please allow 48 business hours for a response.  Please remember that this is for non-urgent requests.  _______________________________________________________

## 2024-06-15 ENCOUNTER — Encounter: Admitting: Gastroenterology

## 2024-06-15 ENCOUNTER — Encounter: Payer: Self-pay | Admitting: Physician Assistant

## 2024-06-16 ENCOUNTER — Encounter

## 2024-06-16 ENCOUNTER — Ambulatory Visit: Admitting: Internal Medicine

## 2024-06-16 ENCOUNTER — Encounter: Payer: Self-pay | Admitting: Internal Medicine

## 2024-06-16 VITALS — BP 125/77 | HR 65 | Temp 97.9°F | Resp 11 | Ht 62.0 in | Wt 142.0 lb

## 2024-06-16 DIAGNOSIS — R09A2 Foreign body sensation, throat: Secondary | ICD-10-CM

## 2024-06-16 DIAGNOSIS — K449 Diaphragmatic hernia without obstruction or gangrene: Secondary | ICD-10-CM | POA: Diagnosis not present

## 2024-06-16 DIAGNOSIS — K219 Gastro-esophageal reflux disease without esophagitis: Secondary | ICD-10-CM

## 2024-06-16 DIAGNOSIS — Z903 Acquired absence of stomach [part of]: Secondary | ICD-10-CM | POA: Diagnosis not present

## 2024-06-16 MED ORDER — SODIUM CHLORIDE 0.9 % IV SOLN: 500.0000 mL | INTRAVENOUS | Status: DC

## 2024-06-16 NOTE — Progress Notes (Signed)
 Patient presenting for upper endoscopy to evaluate reflux refractory to twice daily omeprazole   Please see office note dated 06/14/2024 for details and current H&P  Patient is appropriate for upper endoscopy in the [facility] today.

## 2024-06-16 NOTE — Progress Notes (Signed)
 Pt's states no medical or surgical changes since previsit or office visit.

## 2024-06-16 NOTE — Patient Instructions (Signed)
 Handouts given: Hiatal Hernia Resume previous diet. Try to limit solid food for 3 hours and liquid 1 hour prior to lying down/bedtime. Continue present medications. Continue newly prescribed pantoprazole  40 mg daily. If persistent heartburn or regurgitation please contact my office.   YOU HAD AN ENDOSCOPIC PROCEDURE TODAY AT THE [facility]:   Refer to the procedure report that was given to you for any specific questions about what was found during the examination.  If the procedure report does not answer your questions, please call your gastroenterologist to clarify.  If you requested that your care partner not be given the details of your procedure findings, then the procedure report has been included in a sealed envelope for you to review at your convenience later.  YOU SHOULD EXPECT: Some feelings of bloating in the abdomen. Passage of more gas than usual.  Walking can help get rid of the air that was put into your GI tract during the procedure and reduce the bloating. If you had a lower endoscopy (such as a colonoscopy or flexible sigmoidoscopy) you may notice spotting of blood in your stool or on the toilet paper. If you underwent a bowel prep for your procedure, you may not have a normal bowel movement for a few days.  Please Note:  You might notice some irritation and congestion in your nose or some drainage.  This is from the oxygen used during your procedure.  There is no need for concern and it should clear up in a day or so.  SYMPTOMS TO REPORT IMMEDIATELY:   Following upper endoscopy (EGD)  Vomiting of blood or coffee ground material  New chest pain or pain under the shoulder blades  Painful or persistently difficult swallowing  New shortness of breath  Fever of 100F or higher  Black, tarry-looking stools  For urgent or emergent issues, a gastroenterologist can be reached at any hour by calling [phone]. Do not use MyChart messaging for urgent concerns.     DIET:  We do recommend a small meal at first, but then you may proceed to your regular diet.  Drink plenty of fluids but you should avoid alcoholic beverages for 24 hours.  ACTIVITY:  You should plan to take it easy for the rest of today and you should NOT DRIVE or use heavy machinery until tomorrow (because of the sedation medicines used during the test).    FOLLOW UP: Our staff will call the number listed on your records the next business day following your procedure.  We will call around 7:15- 8:00 am to check on you and address any questions or concerns that you may have regarding the information given to you following your procedure. If we do not reach you, we will leave a message.     If any biopsies were taken you will be contacted by phone or by letter within the next 1-3 weeks.  Please call us  at [phone] if you have not heard about the biopsies in 3 weeks.    SIGNATURES/CONFIDENTIALITY: You and/or your care partner have signed paperwork which will be entered into your electronic medical record.  These signatures attest to the fact that that the information above on your After Visit Summary has been reviewed and is understood.  Full responsibility of the confidentiality of this discharge information lies with you and/or your care-partner.

## 2024-06-16 NOTE — Op Note (Signed)
 [facility] Patient Name: Shannon Soto Procedure Date: 06/16/2024 1:46 PM MRN: [id number] Endoscopist: Gordy CHRISTELLA Starch , MD, [id number] Age: 53 Referring MD:  Date of Birth: 1970-09-05 Gender: Female Account #: [id number] Procedure:                Upper GI endoscopy Indications:              Gastro-esophageal reflux disease, Globus sensation,                            Regurgitation -- despite omeprazole  20 mg BID,                            recent change to pantoprazole  40 mg daily Medicines:                Monitored Anesthesia Care Procedure:                Pre-Anesthesia Assessment:                           - Prior to the procedure, a History and Physical                            was performed, and patient medications and                            allergies were reviewed. The patient's tolerance of                            previous anesthesia was also reviewed. The risks                            and benefits of the procedure and the sedation                            options and risks were discussed with the patient.                            All questions were answered, and informed consent                            was obtained. Prior Anticoagulants: The patient has                            taken no anticoagulant or antiplatelet agents. ASA                            Grade Assessment: II - A patient with mild systemic                            disease. After reviewing the risks and benefits,                            the patient was deemed in satisfactory condition to  undergo the procedure.                           After obtaining informed consent, the endoscope was                            passed under direct vision. Throughout the                            procedure, the patient's blood pressure, pulse, and                            oxygen saturations were monitored continuously. The                            Olympus  Scope [id number] was introduced through the                            mouth, and advanced to the second part of duodenum.                            The upper GI endoscopy was accomplished without                            difficulty. The patient tolerated the procedure                            well. Scope In: Scope Out: Findings:                 Normal mucosa was found in the entire esophagus.                           A 1 cm hiatal hernia was present.                           Evidence of a sleeve gastrectomy was found in the                            gastric body. This was characterized by healthy                            appearing mucosa.                           The examined duodenum was normal. Complications:            No immediate complications. Estimated Blood Loss:     Estimated blood loss: none. Impression:               - Normal mucosa was found in the entire esophagus.                           - 1 cm hiatal hernia.                           -  A sleeve gastrectomy was found, characterized by                            healthy appearing mucosa.                           - Normal examined duodenum.                           - No specimens collected. Recommendation:           - Patient has a contact number available for                            emergencies. The signs and symptoms of potential                            delayed complications were discussed with the                            patient. Return to normal activities tomorrow.                            Written discharge instructions were provided to the                            patient.                           - Resume previous diet. Try to limit solid food 3                            hours and liquid 1 hour prior to lying down/bedtime.                           - Continue present medications. Continue newly                            prescribed pantoprazole  40 mg daily. If persistent                             heartburn or regurgitation please contact my office. Gordy CHRISTELLA Starch, MD 06/16/2024 2:02:22 PM This report has been signed electronically.

## 2024-06-16 NOTE — Progress Notes (Signed)
 Sedate, gd SR, tolerated procedure well, VSS, report to RN

## 2024-06-19 ENCOUNTER — Telehealth: Payer: Self-pay | Admitting: *Deleted

## 2024-06-19 NOTE — Telephone Encounter (Signed)
  Follow up Call-     06/16/2024    1:12 PM  Call back number  Post procedure Call Back phone  # [phone]  Permission to leave phone message Yes     Patient questions:  Do you have a fever, pain , or abdominal swelling? No. Pain Score  0 *  Have you tolerated food without any problems? Yes.    Have you been able to return to your normal activities? Yes.    Do you have any questions about your discharge instructions: Diet   No. Medications  No. Follow up visit  No.  Do you have questions or concerns about your Care? No.  Actions: * If pain score is 4 or above: No action needed, pain <4.

## 2024-06-23 ENCOUNTER — Other Ambulatory Visit: Payer: Self-pay

## 2024-06-24 ENCOUNTER — Other Ambulatory Visit: Payer: Self-pay

## 2024-06-26 ENCOUNTER — Other Ambulatory Visit: Payer: Self-pay

## 2024-06-30 ENCOUNTER — Other Ambulatory Visit: Payer: Self-pay

## 2024-07-12 ENCOUNTER — Ambulatory Visit
Admission: RE | Admit: 2024-07-12 | Discharge: 2024-07-12 | Disposition: A | Discharge status: Home / Self Care | Source: Ambulatory Visit | Attending: Family Medicine | Admitting: Family Medicine

## 2024-07-12 DIAGNOSIS — Z1231 Encounter for screening mammogram for malignant neoplasm of breast: Secondary | ICD-10-CM | POA: Insufficient documentation

## 2024-07-17 ENCOUNTER — Ambulatory Visit: Payer: Self-pay | Admitting: Family Medicine

## 2024-07-17 ENCOUNTER — Other Ambulatory Visit: Payer: Self-pay | Admitting: Family Medicine

## 2024-07-17 DIAGNOSIS — R928 Other abnormal and inconclusive findings on diagnostic imaging of breast: Secondary | ICD-10-CM

## 2024-07-18 ENCOUNTER — Ambulatory Visit
Admission: RE | Admit: 2024-07-18 | Discharge: 2024-07-18 | Disposition: A | Discharge status: Home / Self Care | Source: Ambulatory Visit | Attending: Family Medicine | Admitting: Family Medicine

## 2024-07-18 DIAGNOSIS — N6311 Unspecified lump in the right breast, upper outer quadrant: Secondary | ICD-10-CM | POA: Diagnosis not present

## 2024-07-18 DIAGNOSIS — R92321 Mammographic fibroglandular density, right breast: Secondary | ICD-10-CM | POA: Diagnosis not present

## 2024-07-18 DIAGNOSIS — R928 Other abnormal and inconclusive findings on diagnostic imaging of breast: Secondary | ICD-10-CM | POA: Diagnosis not present

## 2024-07-18 LAB — COMPREHENSIVE METABOLIC PANEL WITH GFR
AG Ratio: 2 (calc) (ref 1.0–2.5)
ALT: 16 U/L (ref 6–29)
AST: 16 U/L (ref 10–35)
Albumin: 4.4 g/dL (ref 3.6–5.1)
Alkaline phosphatase (APISO): 84 U/L (ref 37–153)
BUN: 18 mg/dL (ref 7–25)
CO2: 29 mmol/L (ref 20–32)
Calcium: 9.3 mg/dL (ref 8.6–10.4)
Chloride: 103 mmol/L (ref 98–110)
Creat: 0.74 mg/dL (ref 0.50–1.03)
Globulin: 2.2 g/dL (ref 1.9–3.7)
Glucose, Bld: 75 mg/dL (ref 65–99)
Potassium: 4.3 mmol/L (ref 3.5–5.3)
Sodium: 140 mmol/L (ref 135–146)
Total Bilirubin: 0.2 mg/dL (ref 0.2–1.2)
Total Protein: 6.6 g/dL (ref 6.1–8.1)
eGFR: 95 mL/min/1.73m2 (ref >=60)

## 2024-07-18 LAB — VITAMIN D 25 HYDROXY (VIT D DEFICIENCY, FRACTURES): Vit D, 25-Hydroxy: 43 ng/mL (ref 30–100)

## 2024-07-18 LAB — CBC WITH DIFFERENTIAL/PLATELET
Absolute Lymphocytes: 2341 {cells}/uL (ref 850–3900)
Absolute Monocytes: 502 {cells}/uL (ref 200–950)
Basophils Absolute: 61 {cells}/uL (ref 0–200)
Basophils Relative: 0.8 %
Eosinophils Absolute: 129 {cells}/uL (ref 15–500)
Eosinophils Relative: 1.7 %
HCT: 37.3 % (ref 35.0–45.0)
Hemoglobin: 11.3 g/dL — ABNORMAL LOW (ref 11.7–15.5)
MCH: 25.4 pg — ABNORMAL LOW (ref 27.0–33.0)
MCHC: 30.3 g/dL — ABNORMAL LOW (ref 32.0–36.0)
MCV: 83.8 fL (ref 80.0–100.0)
MPV: 10.8 fL (ref 7.5–12.5)
Monocytes Relative: 6.6 %
Neutro Abs: 4568 {cells}/uL (ref 1500–7800)
Neutrophils Relative %: 60.1 %
Platelets: 335 Thousand/uL (ref 140–400)
RBC: 4.45 Million/uL (ref 3.80–5.10)
RDW: 13.1 % (ref 11.0–15.0)
Total Lymphocyte: 30.8 %
WBC: 7.6 Thousand/uL (ref 3.8–10.8)

## 2024-07-18 LAB — LIPID PANEL
Cholesterol: 114 mg/dL (ref <200)
HDL: 55 mg/dL (ref >=50)
LDL Cholesterol (Calc): 46 mg/dL
Non-HDL Cholesterol (Calc): 59 mg/dL (ref <130)
Total CHOL/HDL Ratio: 2.1 (calc) (ref <5.0)
Triglycerides: 51 mg/dL (ref <150)

## 2024-07-18 LAB — B12 AND FOLATE PANEL
Folate: 5.7 ng/mL
Vitamin B-12: 316 pg/mL (ref 200–1100)

## 2024-07-18 LAB — ZINC: Zinc: 45 ug/dL — ABNORMAL LOW (ref 60–130)

## 2024-07-18 LAB — MICROALBUMIN / CREATININE URINE RATIO
Creatinine, Urine: 156 mg/dL (ref 20–275)
Microalb Creat Ratio: 4 mg/g{creat} (ref <30)
Microalb, Ur: 0.7 mg/dL

## 2024-07-18 LAB — HEMOGLOBIN A1C
Hgb A1c MFr Bld: 5.3 % (ref <5.7)
Mean Plasma Glucose: 105 mg/dL
eAG (mmol/L): 5.8 mmol/L

## 2024-07-19 ENCOUNTER — Other Ambulatory Visit: Payer: Self-pay

## 2024-07-19 ENCOUNTER — Ambulatory Visit: Payer: Self-pay | Admitting: Family Medicine

## 2024-07-19 DIAGNOSIS — D649 Anemia, unspecified: Secondary | ICD-10-CM

## 2024-07-26 ENCOUNTER — Ambulatory Visit
Admission: RE | Admit: 2024-07-26 | Discharge: 2024-07-26 | Disposition: A | Discharge status: Home / Self Care | Source: Ambulatory Visit | Attending: Family Medicine | Admitting: Family Medicine

## 2024-07-26 ENCOUNTER — Ambulatory Visit
Admission: RE | Admit: 2024-07-26 | Discharge: 2024-07-26 | Disposition: A | Source: Ambulatory Visit | Attending: Family Medicine | Admitting: Family Medicine

## 2024-07-26 ENCOUNTER — Encounter

## 2024-07-26 ENCOUNTER — Other Ambulatory Visit

## 2024-07-26 DIAGNOSIS — R928 Other abnormal and inconclusive findings on diagnostic imaging of breast: Secondary | ICD-10-CM | POA: Diagnosis present

## 2024-07-26 DIAGNOSIS — N641 Fat necrosis of breast: Secondary | ICD-10-CM | POA: Diagnosis not present

## 2024-07-26 DIAGNOSIS — N6311 Unspecified lump in the right breast, upper outer quadrant: Secondary | ICD-10-CM | POA: Diagnosis not present

## 2024-07-26 HISTORY — PX: BREAST BIOPSY: SHX20

## 2024-07-26 MED ORDER — LIDOCAINE 1 % OPTIME INJ - NO CHARGE
2.0000 mL | Freq: Once | INTRAMUSCULAR | Status: AC
  Administered 2024-07-26: 2 mL via INTRADERMAL
  Filled 2024-07-26: qty 2

## 2024-07-26 MED ORDER — LIDOCAINE-EPINEPHRINE 1 %-1:100000 IJ SOLN
10.0000 mL | Freq: Once | INTRAMUSCULAR | Status: AC
  Administered 2024-07-26: 10 mL via INTRADERMAL

## 2024-07-27 ENCOUNTER — Ambulatory Visit: Payer: Self-pay | Admitting: Family Medicine

## 2024-07-27 LAB — SURGICAL PATHOLOGY

## 2024-07-28 ENCOUNTER — Ambulatory Visit: Admitting: Physician Assistant

## 2024-08-24 ENCOUNTER — Ambulatory Visit
Admission: EM | Admit: 2024-08-24 | Discharge: 2024-08-24 | Disposition: A | Discharge status: Home / Self Care | Attending: Family Medicine | Admitting: Family Medicine

## 2024-08-24 DIAGNOSIS — J069 Acute upper respiratory infection, unspecified: Secondary | ICD-10-CM

## 2024-08-24 LAB — POCT INFLUENZA A/B
Influenza A, POC: NEGATIVE
Influenza B, POC: NEGATIVE

## 2024-08-24 MED ORDER — AZELASTINE HCL 0.1 % NA SOLN: 1.0000 | Freq: Two times a day (BID) | NASAL | 0 refills | Status: AC

## 2024-08-24 MED ORDER — LIDOCAINE VISCOUS HCL 2 % MT SOLN: 10.0000 mL | OROMUCOSAL | 0 refills | Status: AC | PRN

## 2024-08-24 MED ORDER — BENZONATATE 200 MG PO CAPS: 200.0000 mg | ORAL_CAPSULE | Freq: Three times a day (TID) | ORAL | 0 refills | Status: AC | PRN

## 2024-08-24 NOTE — ED Triage Notes (Signed)
 Pt reports sore throat, cough nausea, headache, fever, congestion x 1 day.

## 2024-08-24 NOTE — Discharge Instructions (Signed)
 In addition to the prescribed medications, you may take Coricidin HBP, plain Mucinex, use saline sinus rinses, humidifiers, over-the-counter pain relievers as needed.  Follow-up for worsening or unresolving symptoms.

## 2024-08-28 NOTE — ED Provider Notes (Signed)
 " [facility]    CSN: [id number] Arrival date & time: 08/24/24  1503      History   Chief Complaint No chief complaint on file.   HPI Shannon Soto is a 54 y.o. female.   Patient presenting today with 1 day history of sore throat, cough, nausea, headache, fever, and congestion.  Denies chest pain, shortness of breath, vomiting, diarrhea.  So far trying over-the-counter remedies with minimal relief.    Past Medical History:  Diagnosis Date   Diabetes mellitus without complication (HCC)    improved after gastric sleeve   GERD (gastroesophageal reflux disease)    Hyperlipidemia    Hypertension    Migraine headache    2x/mo   Orthodontics    braces   Raynaud's syndrome     Patient Active Problem List   Diagnosis Date Noted   B12 deficiency 11/12/2022   Mild recurrent major depression 11/12/2022   Hypertension, benign 11/12/2022   Type 2 diabetes with nephropathy (HCC) 11/12/2022   Headache disorder 09/10/2020   TIA (transient ischemic attack) 05/06/2020   Numbness and tingling in left hand 04/16/2020   Encounter for screening colonoscopy    Polyp of sigmoid colon    Controlled type 2 diabetes mellitus with neuropathy (HCC) 05/12/2016   Raynaud's disease without gangrene 05/12/2016   Intertrigo 09/06/2015   Migraine headache without aura 02/28/2015   Essential hypertension 02/28/2015   History of obesity 02/28/2015   Hyperlipidemia 02/28/2015   GERD (gastroesophageal reflux disease) 02/28/2015   Vitamin D  deficiency 02/28/2015   Chronic constipation 02/28/2015   Chronic insomnia 02/28/2015   Peripheral neuropathy 02/28/2015   Vitiligo 02/28/2015   Lower leg mass 02/28/2015   Depression with anxiety 02/28/2015   History of bariatric surgery 11/05/2014    Past Surgical History:  Procedure Laterality Date   ABDOMINAL HYSTERECTOMY     APPENDECTOMY     BREAST BIOPSY Right    US  BX RIGHT BREAST HEART CLIP-PATH PENDING   BREAST BIOPSY Right  07/26/2024   US  RT BREAST BX W LOC DEV 1ST LESION IMG BX SPEC US  GUIDE 07/26/2024 [facility]   BREAST SURGERY     BREATH TEK H PYLORI N/A 08/27/2014   Procedure: BREATH TEK H PYLORI;  Surgeon: Donnice KATHEE Lunger, MD;  Location: [facility];  Service: General;  Laterality: N/A;   CATARACT EXTRACTION W/PHACO Left 09/20/2019   Procedure: CATARACT EXTRACTION PHACO AND INTRAOCULAR LENS PLACEMENT (IOC) LEFT 0.74 00:12.3 6.0%;  Surgeon: Mittie Gaskin, MD;  Location: [facility];  Service: Ophthalmology;  Laterality: Left;   CATARACT EXTRACTION W/PHACO Right 10/11/2019   Procedure: CATARACT EXTRACTION PHACO AND INTRAOCULAR LENS PLACEMENT (IOC)  RIGHT 1.55 00:24.1 6.4%;  Surgeon: Mittie Gaskin, MD;  Location: [facility];  Service: Ophthalmology;  Laterality: Right;  diabetic   COLONOSCOPY WITH PROPOFOL  N/A 02/17/2019   Procedure: COLONOSCOPY WITH PROPOFOL ;  Surgeon: Jinny Carmine, MD;  Location: [facility];  Service: Endoscopy;  Laterality: N/A;   HIATAL HERNIA REPAIR  11/05/2014   Procedure: LAPAROSCOPIC REPAIR OF HIATAL HERNIA;  Surgeon: Donnice Lunger, MD;  Location: [facility];  Service: General;;   LAPAROSCOPIC GASTRIC SLEEVE RESECTION N/A 11/05/2014   Procedure: LAPAROSCOPIC GASTRIC SLEEVE RESECTION;  Surgeon: Donnice Lunger, MD;  Location: [facility];  Service: General;  Laterality: N/A;   POLYPECTOMY  02/17/2019   Procedure: POLYPECTOMY;  Surgeon: Jinny Carmine, MD;  Location: [facility];  Service: Endoscopy;;   REDUCTION MAMMAPLASTY Bilateral 08/25/1995   UPPER GI ENDOSCOPY  11/05/2014   Procedure: UPPER GI ENDOSCOPY;  Surgeon: Donnice Lunger, MD;  Location: [facility];  Service: General;;    OB History   No obstetric history on file.      Home Medications    Prior to Admission medications  Medication Sig Start Date End Date Taking? Authorizing Provider  azelastine  (ASTELIN ) 0.1 % nasal spray Place 1 spray into both nostrils 2 (two) times daily.  Use in each nostril as directed 08/24/24  Yes Stuart Vernell Norris, PA-C  benzonatate  (TESSALON ) 200 MG capsule Take 1 capsule (200 mg total) by mouth 3 (three) times daily as needed for cough. 08/24/24  Yes Stuart Vernell Norris, PA-C  lidocaine  (XYLOCAINE ) 2 % solution Use as directed 10 mLs in the mouth or throat every 3 (three) hours as needed. 08/24/24  Yes Stuart Vernell Norris, PA-C  amLODipine  (NORVASC ) 10 MG tablet Take 1 tablet (10 mg total) by mouth daily. 02/01/24   Sowles, Krichna, MD  aspirin  EC 81 MG tablet Take 81 mg by mouth daily. 05/31/20   [provider]  Calcium  Carb-Cholecalciferol  (CALCIUM -VITAMIN D3) 500-400 MG-UNIT TABS Take 1 tablet by mouth 3 (three) times daily.    [provider]  cyanocobalamin  500 MCG tablet Take 500 mcg by mouth every 3 (three) days. Mon, wed, fri    [provider]  ferrous gluconate  ([employer] IRON ) 240 (27 FE) MG tablet Take 1 tablet (240 mg total) by mouth 3 (three) times daily with meals. 02/28/15   Sowles, Krichna, MD  FLUoxetine  (PROZAC ) 10 MG capsule Take 1 capsule (10 mg total) by mouth daily. 02/01/24 01/31/25  Sowles, Krichna, MD  gabapentin  (NEURONTIN ) 600 MG tablet Take 1.5 tablets (900 mg total) by mouth at bedtime. 02/01/24   Sowles, Krichna, MD  linaclotide  (LINZESS ) 72 MCG capsule Take 1 capsule (72 mcg total) by mouth daily before breakfast. 12/16/23   Therisa Bi, MD  losartan  (COZAAR ) 100 MG tablet Take 1 tablet (100 mg total) by mouth daily. 02/01/24 01/31/25  Sowles, Krichna, MD  Multiple Vitamins-Minerals (MULTIVITAMIN ADULTS PO) Take 1 tablet by mouth daily. 08/22/19   [provider]  nystatin  (MYCOSTATIN /NYSTOP ) powder Apply 1 Application topically 3 (three) times daily. 06/02/23   Sowles, Krichna, MD  ondansetron  (ZOFRAN -ODT) 4 MG disintegrating tablet Take 1 tablet (4 mg total) by mouth every 8 (eight) hours as needed for nausea or vomiting 06/06/24   Sowles, Krichna, MD  OPZELURA  1.5 % CREA APPLY ONE  APPLICATION TOPICALLY DAILY. APPLY TO AFFECTED AREAS FOR ECZEMA AND VITILIGO 12/22/21   Jackquline Sawyer, MD  pantoprazole  (PROTONIX ) 40 MG tablet Take 1 tablet (40 mg total) by mouth daily. 06/14/24   [place], PA-C  Rimegepant Sulfate  (NURTEC) 75 MG TBDP Take 1 tablet (75 mg total) by mouth daily as needed. 10/04/23   Sowles, Krichna, MD  rosuvastatin  (CRESTOR ) 10 MG tablet Take 1 tablet (10 mg total) by mouth daily. 06/06/24   Sowles, Krichna, MD  Semaglutide , 1 MG/DOSE, 4 MG/3ML SOPN Inject 1 mg into the skin once a week. 06/06/24   Sowles, Krichna, MD  zolpidem  (AMBIEN ) 10 MG tablet Take 1 tablet (10 mg total) by mouth at bedtime. Fill July 31 st 2025 06/06/24   Glenard Mire, MD    Family History Family History  Problem Relation Age of Onset   Diabetes Paternal Uncle    Stroke Father    Hypertension Other    Breast cancer Maternal Grandmother 70    Social History Social History[1]   Allergies  Patient has no known allergies.   Review of Systems Review of Systems Per HPI  Physical Exam Triage Vital Signs ED Triage Vitals  Encounter Vitals Group     BP 08/24/24 1549 118/80     Girls Systolic BP Percentile --      Girls Diastolic BP Percentile --      Boys Systolic BP Percentile --      Boys Diastolic BP Percentile --      Pulse Rate 08/24/24 1549 69     Resp 08/24/24 1549 20     Temp 08/24/24 1549 98.2 F (36.8 C)     Temp Source 08/24/24 1549 Oral     SpO2 08/24/24 1549 95 %     Weight --      Height --      Head Circumference --      Peak Flow --      Pain Score 08/24/24 1552 5     Pain Loc --      Pain Education --      Exclude from Growth Chart --    No data found.  Updated Vital Signs BP 118/80 (BP Location: Right Arm)   Pulse 69   Temp 98.2 F (36.8 C) (Oral)   Resp 20   SpO2 95%   Visual Acuity Right Eye Distance:   Left Eye Distance:   Bilateral Distance:    Right Eye Near:   Left Eye Near:    Bilateral Near:     Physical  Exam Vitals and nursing note reviewed.  Constitutional:      Appearance: Normal appearance. She is not ill-appearing.  HENT:     Head: Atraumatic.     Right Ear: Tympanic membrane and external ear normal.     Left Ear: Tympanic membrane and external ear normal.     Nose: Rhinorrhea present.     Mouth/Throat:     Mouth: Mucous membranes are moist.     Pharynx: Posterior oropharyngeal erythema present.  Eyes:     Extraocular Movements: Extraocular movements intact.     Conjunctiva/sclera: Conjunctivae normal.  Cardiovascular:     Rate and Rhythm: Normal rate and regular rhythm.     Heart sounds: Normal heart sounds.  Pulmonary:     Effort: Pulmonary effort is normal.     Breath sounds: Normal breath sounds. No wheezing.  Musculoskeletal:        General: Normal range of motion.     Cervical back: Normal range of motion and neck supple.  Skin:    General: Skin is warm and dry.  Neurological:     Mental Status: She is alert and oriented to person, place, and time.  Psychiatric:        Mood and Affect: Mood normal.        Thought Content: Thought content normal.        Judgment: Judgment normal.      UC Treatments / Results  Labs (all labs ordered are listed, but only abnormal results are displayed) Labs Reviewed  POCT INFLUENZA A/B    EKG   Radiology No results found.  Procedures Procedures (including critical care time)  Medications Ordered in UC Medications - No data to display  Initial Impression / Assessment and Plan / UC Course  I have reviewed the triage vital signs and the nursing notes.  Pertinent labs & imaging results that were available during my care of the patient were reviewed by me and considered in my medical decision  making (see chart for details).     Vitals and exam reassuring today, rapid flu negative for influenza.  Suspect viral respiratory infection.  Treat with Astelin , viscous lidocaine , Tessalon , supportive over-the-counter  medications and home care.  Return for worsening or unresolving symptoms.  Final Clinical Impressions(s) / UC Diagnoses   Final diagnoses:  Viral URI with cough     Discharge Instructions      In addition to the prescribed medications, you may take Coricidin HBP, plain Mucinex, use saline sinus rinses, humidifiers, over-the-counter pain relievers as needed.  Follow-up for worsening or unresolving symptoms.    ED Prescriptions     Medication Sig Dispense Auth. Provider   azelastine  (ASTELIN ) 0.1 % nasal spray Place 1 spray into both nostrils 2 (two) times daily. Use in each nostril as directed 30 mL Stuart Vernell Norris, PA-C   lidocaine  (XYLOCAINE ) 2 % solution Use as directed 10 mLs in the mouth or throat every 3 (three) hours as needed. 100 mL Stuart Vernell Norris, PA-C   benzonatate  (TESSALON ) 200 MG capsule Take 1 capsule (200 mg total) by mouth 3 (three) times daily as needed for cough. 20 capsule Stuart Vernell Norris, [place]      PDMP not reviewed this encounter.    [1]  Social History Tobacco Use   Smoking status: Never   Smokeless tobacco: Never  Vaping Use   Vaping status: Never Used  Substance Use Topics   Alcohol use: No    Alcohol/week: 0.0 standard drinks of alcohol    Comment: occasional    Drug use: No     Stuart Vernell Norris, PA-C 08/28/24 1702  "

## 2024-09-22 ENCOUNTER — Other Ambulatory Visit: Payer: Self-pay

## 2024-09-22 ENCOUNTER — Encounter: Payer: Self-pay | Admitting: Family Medicine

## 2024-09-22 DIAGNOSIS — E1121 Type 2 diabetes mellitus with diabetic nephropathy: Secondary | ICD-10-CM

## 2024-09-22 DIAGNOSIS — G43009 Migraine without aura, not intractable, without status migrainosus: Secondary | ICD-10-CM

## 2024-09-22 DIAGNOSIS — F5104 Psychophysiologic insomnia: Secondary | ICD-10-CM

## 2024-09-22 DIAGNOSIS — F330 Major depressive disorder, recurrent, mild: Secondary | ICD-10-CM

## 2024-09-22 DIAGNOSIS — I10 Essential (primary) hypertension: Secondary | ICD-10-CM

## 2024-09-22 DIAGNOSIS — G629 Polyneuropathy, unspecified: Secondary | ICD-10-CM

## 2024-09-22 MED ORDER — LOSARTAN POTASSIUM 100 MG PO TABS
100.0000 mg | ORAL_TABLET | Freq: Every day | ORAL | 0 refills | Status: AC
  Filled 2024-09-22: qty 90, 90d supply, fill #0

## 2024-09-22 MED ORDER — NURTEC 75 MG PO TBDP
1.0000 | ORAL_TABLET | Freq: Every day | ORAL | 1 refills | Status: AC | PRN
  Filled 2024-09-22: qty 9, 9d supply, fill #0
  Filled 2024-09-26: qty 8, 15d supply, fill #0
  Filled 2024-09-26: qty 9, 9d supply, fill #0

## 2024-09-22 MED ORDER — ZOLPIDEM TARTRATE 10 MG PO TABS
10.0000 mg | ORAL_TABLET | Freq: Every day | ORAL | 0 refills | Status: AC
  Filled 2024-09-22 – 2024-09-25 (×3): qty 90, 90d supply, fill #0

## 2024-09-22 MED ORDER — AMLODIPINE BESYLATE 10 MG PO TABS
10.0000 mg | ORAL_TABLET | Freq: Every day | ORAL | 0 refills | Status: AC
  Filled 2024-09-22: qty 90, 90d supply, fill #0

## 2024-09-22 MED ORDER — FLUOXETINE HCL 10 MG PO CAPS
10.0000 mg | ORAL_CAPSULE | Freq: Every day | ORAL | 1 refills | Status: AC
  Filled 2024-09-22: qty 90, 90d supply, fill #0

## 2024-09-22 MED ORDER — ONDANSETRON 4 MG PO TBDP
4.0000 mg | ORAL_TABLET | Freq: Three times a day (TID) | ORAL | 0 refills | Status: AC | PRN
  Filled 2024-09-22: qty 60, 20d supply, fill #0

## 2024-09-22 MED ORDER — GABAPENTIN 600 MG PO TABS
900.0000 mg | ORAL_TABLET | Freq: Every day | ORAL | 0 refills | Status: AC
  Filled 2024-09-22 – 2024-09-25 (×3): qty 135, 90d supply, fill #0

## 2024-09-23 ENCOUNTER — Other Ambulatory Visit: Payer: Self-pay

## 2024-09-24 ENCOUNTER — Other Ambulatory Visit: Payer: Self-pay

## 2024-09-25 ENCOUNTER — Other Ambulatory Visit: Payer: Self-pay

## 2024-09-26 ENCOUNTER — Other Ambulatory Visit: Payer: Self-pay

## 2024-09-26 ENCOUNTER — Other Ambulatory Visit (HOSPITAL_COMMUNITY): Payer: Self-pay

## 2024-09-26 NOTE — Telephone Encounter (Signed)
 Good morning Shannon Soto, not sure why they stated it needed a PA but he doesn't and the pharmacy now has it filled for her and her copay is $0.00

## 2024-10-20 ENCOUNTER — Ambulatory Visit: Admitting: Family Medicine
